# Patient Record
Sex: Male | Born: 1945 | Race: White | Hispanic: No | Marital: Married | Smoking: Former smoker
Health system: Southern US, Community
[De-identification: ages and names within clinical notes are randomized; demographics above are authoritative.]

## PROBLEM LIST (undated history)

## (undated) DIAGNOSIS — B029 Zoster without complications: Secondary | ICD-10-CM

## (undated) DIAGNOSIS — I2510 Atherosclerotic heart disease of native coronary artery without angina pectoris: Secondary | ICD-10-CM

## (undated) DIAGNOSIS — T7840XA Allergy, unspecified, initial encounter: Secondary | ICD-10-CM

## (undated) DIAGNOSIS — I472 Ventricular tachycardia, unspecified: Secondary | ICD-10-CM

## (undated) DIAGNOSIS — Z9889 Other specified postprocedural states: Secondary | ICD-10-CM

## (undated) DIAGNOSIS — D72819 Decreased white blood cell count, unspecified: Secondary | ICD-10-CM

## (undated) DIAGNOSIS — I10 Essential (primary) hypertension: Secondary | ICD-10-CM

## (undated) DIAGNOSIS — Z87442 Personal history of urinary calculi: Secondary | ICD-10-CM

## (undated) DIAGNOSIS — J45909 Unspecified asthma, uncomplicated: Secondary | ICD-10-CM

## (undated) DIAGNOSIS — K219 Gastro-esophageal reflux disease without esophagitis: Secondary | ICD-10-CM

## (undated) DIAGNOSIS — E785 Hyperlipidemia, unspecified: Secondary | ICD-10-CM

## (undated) DIAGNOSIS — C4491 Basal cell carcinoma of skin, unspecified: Secondary | ICD-10-CM

## (undated) DIAGNOSIS — M1990 Unspecified osteoarthritis, unspecified site: Secondary | ICD-10-CM

## (undated) DIAGNOSIS — H269 Unspecified cataract: Secondary | ICD-10-CM

## (undated) HISTORY — DX: Zoster without complications: B02.9

## (undated) HISTORY — DX: Allergy, unspecified, initial encounter: T78.40XA

## (undated) HISTORY — DX: Unspecified cataract: H26.9

## (undated) HISTORY — PX: CORONARY ANGIOPLASTY: SHX604

## (undated) HISTORY — DX: Essential (primary) hypertension: I10

## (undated) HISTORY — DX: Other specified postprocedural states: Z98.890

## (undated) HISTORY — DX: Basal cell carcinoma of skin, unspecified: C44.91

## (undated) HISTORY — DX: Unspecified asthma, uncomplicated: J45.909

## (undated) HISTORY — DX: Hyperlipidemia, unspecified: E78.5

## (undated) HISTORY — DX: Atherosclerotic heart disease of native coronary artery without angina pectoris: I25.10

---

## 1975-12-03 HISTORY — PX: PILONIDAL CYST EXCISION: SHX744

## 1977-12-02 HISTORY — PX: GANGLION CYST EXCISION: SHX1691

## 1997-12-02 DIAGNOSIS — C4491 Basal cell carcinoma of skin, unspecified: Secondary | ICD-10-CM

## 1997-12-02 HISTORY — PX: SKIN CANCER EXCISION: SHX779

## 1997-12-02 HISTORY — DX: Basal cell carcinoma of skin, unspecified: C44.91

## 1998-12-02 HISTORY — PX: VEIN SURGERY: SHX48

## 2005-10-17 ENCOUNTER — Ambulatory Visit: Payer: Self-pay | Admitting: Cardiovascular Disease

## 2006-07-10 ENCOUNTER — Emergency Department: Payer: Self-pay | Admitting: Emergency Medicine

## 2006-07-10 ENCOUNTER — Other Ambulatory Visit: Payer: Self-pay

## 2009-02-07 ENCOUNTER — Ambulatory Visit: Payer: Self-pay | Admitting: Gastroenterology

## 2009-04-25 ENCOUNTER — Ambulatory Visit: Payer: Self-pay | Admitting: Cardiovascular Disease

## 2011-12-03 DIAGNOSIS — Z87442 Personal history of urinary calculi: Secondary | ICD-10-CM

## 2011-12-03 HISTORY — DX: Personal history of urinary calculi: Z87.442

## 2012-04-13 ENCOUNTER — Ambulatory Visit: Payer: Self-pay | Admitting: Family Medicine

## 2013-04-16 ENCOUNTER — Ambulatory Visit: Payer: Self-pay | Admitting: Family Medicine

## 2015-06-01 ENCOUNTER — Other Ambulatory Visit: Payer: Self-pay | Admitting: Family Medicine

## 2015-06-13 ENCOUNTER — Other Ambulatory Visit: Payer: Self-pay | Admitting: Family Medicine

## 2015-06-20 DIAGNOSIS — I10 Essential (primary) hypertension: Secondary | ICD-10-CM | POA: Insufficient documentation

## 2015-06-20 DIAGNOSIS — B029 Zoster without complications: Secondary | ICD-10-CM | POA: Insufficient documentation

## 2015-06-20 DIAGNOSIS — C4491 Basal cell carcinoma of skin, unspecified: Secondary | ICD-10-CM | POA: Insufficient documentation

## 2015-06-20 DIAGNOSIS — E785 Hyperlipidemia, unspecified: Secondary | ICD-10-CM | POA: Insufficient documentation

## 2015-06-20 DIAGNOSIS — I2510 Atherosclerotic heart disease of native coronary artery without angina pectoris: Secondary | ICD-10-CM | POA: Insufficient documentation

## 2015-06-21 ENCOUNTER — Encounter: Payer: Self-pay | Admitting: Family Medicine

## 2015-06-21 ENCOUNTER — Ambulatory Visit (INDEPENDENT_AMBULATORY_CARE_PROVIDER_SITE_OTHER): Payer: 59 | Admitting: Family Medicine

## 2015-06-21 VITALS — BP 121/71 | HR 58 | Temp 97.6°F | Ht 72.0 in | Wt 211.0 lb

## 2015-06-21 DIAGNOSIS — I2510 Atherosclerotic heart disease of native coronary artery without angina pectoris: Secondary | ICD-10-CM | POA: Diagnosis not present

## 2015-06-21 DIAGNOSIS — I2583 Coronary atherosclerosis due to lipid rich plaque: Secondary | ICD-10-CM

## 2015-06-21 DIAGNOSIS — E785 Hyperlipidemia, unspecified: Secondary | ICD-10-CM | POA: Diagnosis not present

## 2015-06-21 DIAGNOSIS — I10 Essential (primary) hypertension: Secondary | ICD-10-CM | POA: Diagnosis not present

## 2015-06-21 DIAGNOSIS — Z0000 Encounter for general adult medical examination without abnormal findings: Secondary | ICD-10-CM | POA: Diagnosis not present

## 2015-06-21 LAB — URINALYSIS, ROUTINE W REFLEX MICROSCOPIC
Bilirubin, UA: NEGATIVE
Glucose, UA: NEGATIVE
KETONES UA: NEGATIVE
LEUKOCYTES UA: NEGATIVE
Nitrite, UA: NEGATIVE
PH UA: 6 (ref 5.0–7.5)
PROTEIN UA: NEGATIVE
RBC UA: NEGATIVE
SPEC GRAV UA: 1.02 (ref 1.005–1.030)
Urobilinogen, Ur: 1 mg/dL (ref 0.2–1.0)

## 2015-06-21 MED ORDER — METOPROLOL SUCCINATE ER 50 MG PO TB24: 50.0000 mg | ORAL_TABLET | Freq: Every day | ORAL | Status: DC

## 2015-06-21 MED ORDER — CLOPIDOGREL BISULFATE 75 MG PO TABS: 75.0000 mg | ORAL_TABLET | Freq: Every day | ORAL | Status: DC

## 2015-06-21 MED ORDER — NIACIN ER (ANTIHYPERLIPIDEMIC) 1000 MG PO TBCR: 1000.0000 mg | EXTENDED_RELEASE_TABLET | Freq: Every day | ORAL | Status: DC

## 2015-06-21 MED ORDER — ATORVASTATIN CALCIUM 80 MG PO TABS: 80.0000 mg | ORAL_TABLET | Freq: Every day | ORAL | Status: DC

## 2015-06-21 MED ORDER — RAMIPRIL 5 MG PO CAPS: 5.0000 mg | ORAL_CAPSULE | Freq: Every day | ORAL | Status: DC

## 2015-06-21 NOTE — Assessment & Plan Note (Signed)
The current medical regimen is effective;  continue present plan and medications.  

## 2015-06-21 NOTE — Progress Notes (Signed)
BP 121/71 mmHg  Pulse 58  Temp(Src) 97.6 F (36.4 C)  Ht 6' (1.829 m)  Wt 211 lb (95.709 kg)  BMI 28.61 kg/m2  SpO2 97%   Subjective:    Patient ID: Shawn Shepard, male    DOB: 12-01-1946, 69 y.o.   MRN: [id number]  HPI: Shawn Shepard is a 69 y.o. male  Chief Complaint  Patient presents with  . Annual Exam  AWV  Patient doing well with medications no issues concerns takes every day without problems or side effects Has very rare hot flashes from the niacin Does have easy bruising from Plavix varicose veins worse has apt at  [facility]  Has marked edema by end of day Edema goes away overnight with no PND orthopnea  Relevant past medical, surgical, family and social history reviewed and updated as indicated. Interim medical history since our last visit reviewed. Allergies and medications reviewed and updated.  Review of Systems  Constitutional: Negative.   HENT: Negative.   Eyes: Negative.   Respiratory: Negative.   Cardiovascular: Negative.   Endocrine: Negative.   Musculoskeletal: Negative.   Skin: Negative.   Allergic/Immunologic: Negative.   Neurological: Negative.   Hematological: Negative.   Psychiatric/Behavioral: Negative.     Per HPI unless specifically indicated above     Objective:    BP 121/71 mmHg  Pulse 58  Temp(Src) 97.6 F (36.4 C)  Ht 6' (1.829 m)  Wt 211 lb (95.709 kg)  BMI 28.61 kg/m2  SpO2 97%  Wt Readings from Last 3 Encounters:  06/21/15 211 lb (95.709 kg)  12/08/14 209 lb (94.802 kg)    Physical Exam  Constitutional: He is oriented to person, place, and time. He appears well-developed and well-nourished.  HENT:  Head: Normocephalic and atraumatic.  Right Ear: External ear normal.  Left Ear: External ear normal.  Eyes: Conjunctivae and EOM are normal. Pupils are equal, round, and reactive to light.  Neck: Normal range of motion. Neck supple.  Cardiovascular: Normal rate, regular rhythm, normal heart sounds and  intact distal pulses.   Pulmonary/Chest: Effort normal and breath sounds normal.  Abdominal: Soft. Bowel sounds are normal. There is no splenomegaly or hepatomegaly.  Genitourinary: Rectum normal, prostate normal and penis normal.  Musculoskeletal: Normal range of motion.  Neurological: He is alert and oriented to person, place, and time. He has normal reflexes.  Skin: No rash noted. No erythema.  Marked varicose veins both legs  Psychiatric: He has a normal mood and affect. His behavior is normal. Judgment and thought content normal.    No results found for this or any previous visit.    Assessment & Plan:   Problem List Items Addressed This Visit      Cardiovascular and Mediastinum   CAD (coronary artery disease)    Followed by cardiology      Relevant Medications   ramipril (ALTACE) 5 MG capsule   niacin (NIASPAN) 1000 MG CR tablet   metoprolol succinate (TOPROL-XL) 50 MG 24 hr tablet   clopidogrel (PLAVIX) 75 MG tablet   atorvastatin (LIPITOR) 80 MG tablet   Other Relevant Orders   LP+ALT+AST Piccolo, Waived   Hypertension - Primary    The current medical regimen is effective;  continue present plan and medications.       Relevant Medications   ramipril (ALTACE) 5 MG capsule   niacin (NIASPAN) 1000 MG CR tablet   metoprolol succinate (TOPROL-XL) 50 MG 24 hr tablet   atorvastatin (LIPITOR) 80 MG  tablet   Other Relevant Orders   Basic metabolic panel     Other   Hyperlipidemia    The current medical regimen is effective;  continue present plan and medications.       Relevant Medications   ramipril (ALTACE) 5 MG capsule   niacin (NIASPAN) 1000 MG CR tablet   metoprolol succinate (TOPROL-XL) 50 MG 24 hr tablet   atorvastatin (LIPITOR) 80 MG tablet   Other Relevant Orders   LP+ALT+AST Piccolo, Waived    Other Visit Diagnoses    PE (physical exam), annual        Relevant Orders    CBC with Differential/Platelet    Comprehensive metabolic panel    Lipid  panel    PSA    TSH    Urinalysis, Routine w reflex microscopic (not at [facility])        Follow up plan: Return in about 6 months (around 12/22/2015), or if symptoms worsen or fail to improve, for f/u medical.

## 2015-06-21 NOTE — Assessment & Plan Note (Signed)
Followed by cardiology 

## 2015-06-22 ENCOUNTER — Encounter: Payer: Self-pay | Admitting: Family Medicine

## 2015-06-22 LAB — CBC WITH DIFFERENTIAL/PLATELET
Basophils Absolute: 0 10*3/uL (ref 0.0–0.2)
Basos: 0 %
EOS (ABSOLUTE): 0.1 10*3/uL (ref 0.0–0.4)
Eos: 3 %
HEMATOCRIT: 41.6 % (ref 37.5–51.0)
Hemoglobin: 14 g/dL (ref 12.6–17.7)
IMMATURE GRANULOCYTES: 0 %
Immature Grans (Abs): 0 10*3/uL (ref 0.0–0.1)
Lymphocytes Absolute: 0.7 10*3/uL (ref 0.7–3.1)
Lymphs: 25 %
MCH: 30.9 pg (ref 26.6–33.0)
MCHC: 33.7 g/dL (ref 31.5–35.7)
MCV: 92 fL (ref 79–97)
MONOCYTES: 16 %
Monocytes Absolute: 0.5 10*3/uL (ref 0.1–0.9)
Neutrophils Absolute: 1.6 10*3/uL (ref 1.4–7.0)
Neutrophils: 56 %
Platelets: 150 10*3/uL (ref 150–379)
RBC: 4.53 x10E6/uL (ref 4.14–5.80)
RDW: 14.4 % (ref 12.3–15.4)
WBC: 2.9 10*3/uL — AB (ref 3.4–10.8)

## 2015-06-22 LAB — COMPREHENSIVE METABOLIC PANEL
ALT: 23 IU/L (ref 0–44)
AST: 26 IU/L (ref 0–40)
Albumin/Globulin Ratio: 1.6 (ref 1.1–2.5)
Albumin: 3.8 g/dL (ref 3.6–4.8)
Alkaline Phosphatase: 73 IU/L (ref 39–117)
BUN/Creatinine Ratio: 14 (ref 10–22)
BUN: 13 mg/dL (ref 8–27)
Bilirubin Total: 0.8 mg/dL (ref 0.0–1.2)
CALCIUM: 9.7 mg/dL (ref 8.6–10.2)
CO2: 23 mmol/L (ref 18–29)
CREATININE: 0.91 mg/dL (ref 0.76–1.27)
Chloride: 104 mmol/L (ref 97–108)
GFR calc Af Amer: 100 mL/min/{1.73_m2} (ref >59)
GFR, EST NON AFRICAN AMERICAN: 87 mL/min/{1.73_m2} (ref >59)
GLOBULIN, TOTAL: 2.4 g/dL (ref 1.5–4.5)
GLUCOSE: 102 mg/dL — AB (ref 65–99)
Potassium: 4.8 mmol/L (ref 3.5–5.2)
SODIUM: 142 mmol/L (ref 134–144)
TOTAL PROTEIN: 6.2 g/dL (ref 6.0–8.5)

## 2015-06-22 LAB — LIPID PANEL
CHOL/HDL RATIO: 2.3 ratio (ref 0.0–5.0)
CHOLESTEROL TOTAL: 127 mg/dL (ref 100–199)
HDL: 55 mg/dL (ref >39)
LDL Calculated: 56 mg/dL (ref 0–99)
Triglycerides: 80 mg/dL (ref 0–149)
VLDL CHOLESTEROL CAL: 16 mg/dL (ref 5–40)

## 2015-06-22 LAB — TSH: TSH: 1.23 u[IU]/mL (ref 0.450–4.500)

## 2015-06-22 LAB — PSA: PROSTATE SPECIFIC AG, SERUM: 0.5 ng/mL (ref 0.0–4.0)

## 2015-12-03 HISTORY — PX: CATARACT EXTRACTION: SUR2

## 2015-12-03 HISTORY — PX: EYE SURGERY: SHX253

## 2015-12-28 ENCOUNTER — Ambulatory Visit (INDEPENDENT_AMBULATORY_CARE_PROVIDER_SITE_OTHER): Payer: 59 | Admitting: Family Medicine

## 2015-12-28 ENCOUNTER — Encounter: Payer: Self-pay | Admitting: Family Medicine

## 2015-12-28 VITALS — BP 121/77 | HR 67 | Temp 97.9°F | Ht 72.3 in | Wt 207.0 lb

## 2015-12-28 DIAGNOSIS — I2583 Coronary atherosclerosis due to lipid rich plaque: Secondary | ICD-10-CM

## 2015-12-28 DIAGNOSIS — E785 Hyperlipidemia, unspecified: Secondary | ICD-10-CM

## 2015-12-28 DIAGNOSIS — Z113 Encounter for screening for infections with a predominantly sexual mode of transmission: Secondary | ICD-10-CM | POA: Diagnosis not present

## 2015-12-28 DIAGNOSIS — I10 Essential (primary) hypertension: Secondary | ICD-10-CM

## 2015-12-28 DIAGNOSIS — I2510 Atherosclerotic heart disease of native coronary artery without angina pectoris: Secondary | ICD-10-CM

## 2015-12-28 LAB — LP+ALT+AST PICCOLO, WAIVED
ALT (SGPT) Piccolo, Waived: 29 U/L (ref 10–47)
AST (SGOT) Piccolo, Waived: 48 U/L — ABNORMAL HIGH (ref 11–38)
Chol/HDL Ratio Piccolo,Waive: 2.3 mg/dL
Cholesterol Piccolo, Waived: 114 mg/dL (ref <200)
HDL Chol Piccolo, Waived: 50 mg/dL — ABNORMAL LOW (ref >59)
LDL Chol Calc Piccolo Waived: 50 mg/dL (ref <100)
Triglycerides Piccolo,Waived: 65 mg/dL (ref <150)
VLDL CHOL CALC PICCOLO,WAIVE: 13 mg/dL (ref <30)

## 2015-12-28 NOTE — Assessment & Plan Note (Signed)
Reviewed EKG with no acute changes Discussed cardiology referral patient will go back to his regular cardiologist Dr. Chancy Milroy He will call for an appointment  because of high risk Discuss calling 911 aspirin We'll get labs including CBC Discuss continuing current medications

## 2015-12-28 NOTE — Assessment & Plan Note (Signed)
The current medical regimen is effective;  continue present plan and medications.  

## 2015-12-28 NOTE — Progress Notes (Signed)
BP 121/77 mmHg  Pulse 67  Temp(Src) 97.9 F (36.6 C)  Ht 6' 0.3" (1.836 m)  Wt 207 lb (93.895 kg)  BMI 27.85 kg/m2  SpO2 96%   Subjective:    Patient ID: Shawn Shepard, male    DOB: 30-Dec-1945, 70 y.o.   MRN: [id number]  HPI: Shawn Shepard is a 70 y.o. male  Chief Complaint  Patient presents with  . Hyperlipidemia  . Hypertension  . Coronary Artery Disease   Patient follow-up cholesterol blood pressure doing well with no complaints from medications Coronary artery disease patient's developed some slight chest pain not necessarily with exertion last for a few seconds. No radiation no nausea vomiting diaphoresis but concerned because of personal history of coronary artery disease This problem has been going on for 2 months or so intermittently Reviewed previous cardiology notes Patient with 40-50% cardiac coronary artery lesions  Relevant past medical, surgical, family and social history reviewed and updated as indicated. Interim medical history since our last visit reviewed. Allergies and medications reviewed and updated.  Other than noted above Review of Systems  Constitutional: Negative.   HENT: Negative.   Eyes: Negative.   Respiratory: Negative.   Cardiovascular: Negative.   Gastrointestinal: Negative.   Endocrine: Negative.   Genitourinary: Negative.   Musculoskeletal: Negative.   Skin: Negative.   Allergic/Immunologic: Negative.   Neurological: Negative.   Hematological: Negative.   Psychiatric/Behavioral: Negative.     Per HPI unless specifically indicated above     Objective:    BP 121/77 mmHg  Pulse 67  Temp(Src) 97.9 F (36.6 C)  Ht 6' 0.3" (1.836 m)  Wt 207 lb (93.895 kg)  BMI 27.85 kg/m2  SpO2 96%  Wt Readings from Last 3 Encounters:  12/28/15 207 lb (93.895 kg)  06/21/15 211 lb (95.709 kg)  12/08/14 209 lb (94.802 kg)    Physical Exam  Constitutional: He is oriented to person, place, and time. He appears well-developed and  well-nourished. No distress.  HENT:  Head: Normocephalic and atraumatic.  Right Ear: Hearing normal.  Left Ear: Hearing normal.  Nose: Nose normal.  Mouth/Throat: Oropharynx is clear and moist.  Eyes: Conjunctivae and lids are normal. Pupils are equal, round, and reactive to light. Right eye exhibits no discharge. Left eye exhibits no discharge. No scleral icterus.  Neck: Normal range of motion. No JVD present. No tracheal deviation present.  Cardiovascular: Normal rate, regular rhythm and normal heart sounds.   No murmur heard. Pulmonary/Chest: Effort normal and breath sounds normal. No stridor. No respiratory distress. He has no wheezes. He has no rales. He exhibits no tenderness.  Abdominal: He exhibits no distension and no mass. There is no tenderness.  Musculoskeletal: Normal range of motion. He exhibits no edema or tenderness.  Lymphadenopathy:    He has no cervical adenopathy.  Neurological: He is alert and oriented to person, place, and time.  Skin: Skin is intact. No rash noted. He is not diaphoretic.  Psychiatric: He has a normal mood and affect. His speech is normal and behavior is normal. Judgment and thought content normal. Cognition and memory are normal.    Results for orders placed or performed in visit on 12/28/15  LP+ALT+AST Piccolo, [employer]  Result Value Ref Range   ALT (SGPT) Piccolo, Waived 29 10 - 47 U/L   AST (SGOT) Piccolo, Waived 48 (H) 11 - 38 U/L   Cholesterol Piccolo, Waived 114 <200 mg/dL   HDL Chol Piccolo, Waived 50 (L) >59 mg/dL  Triglycerides Piccolo,Waived 65 <150 mg/dL   Chol/HDL Ratio Piccolo,Waive 2.3 mg/dL   LDL Chol Calc Piccolo Waived 50 <100 mg/dL   VLDL Chol Calc Piccolo,Waive 13 <30 mg/dL      Assessment & Plan:   Problem List Items Addressed This Visit      Cardiovascular and Mediastinum   Hypertension    The current medical regimen is effective;  continue present plan and medications.       CAD (coronary artery disease)     Reviewed EKG with no acute changes Discussed cardiology referral patient will go back to his regular cardiologist Dr. Chancy Milroy He will call for an appointment  because of high risk Discuss calling 911 aspirin We'll get labs including CBC Discuss continuing current medications       Relevant Orders   EKG 12-Lead (Completed)   CBC with Differential/Platelet     Other   Hyperlipidemia    The current medical regimen is effective;  continue present plan and medications.        Other Visit Diagnoses    Routine screening for STI (sexually transmitted infection)    -  Primary    Relevant Orders    Hepatitis C Antibody        Follow up plan: Return in about 6 months (around 06/26/2016), or if symptoms worsen or fail to improve, for Physical Exam.

## 2015-12-29 LAB — BASIC METABOLIC PANEL
BUN/Creatinine Ratio: 11 (ref 10–22)
BUN: 11 mg/dL (ref 8–27)
CALCIUM: 10.1 mg/dL (ref 8.6–10.2)
CO2: 23 mmol/L (ref 18–29)
Chloride: 105 mmol/L (ref 96–106)
Creatinine, Ser: 0.96 mg/dL (ref 0.76–1.27)
GFR calc non Af Amer: 81 mL/min/{1.73_m2} (ref >59)
GFR, EST AFRICAN AMERICAN: 94 mL/min/{1.73_m2} (ref >59)
Glucose: 106 mg/dL — ABNORMAL HIGH (ref 65–99)
Potassium: 4.4 mmol/L (ref 3.5–5.2)
Sodium: 141 mmol/L (ref 134–144)

## 2015-12-29 LAB — CBC WITH DIFFERENTIAL/PLATELET
BASOS ABS: 0 10*3/uL (ref 0.0–0.2)
Basos: 1 %
EOS (ABSOLUTE): 0.1 10*3/uL (ref 0.0–0.4)
Eos: 5 %
Hematocrit: 41.3 % (ref 37.5–51.0)
Hemoglobin: 13.8 g/dL (ref 12.6–17.7)
IMMATURE GRANS (ABS): 0 10*3/uL (ref 0.0–0.1)
Immature Granulocytes: 0 %
LYMPHS: 25 %
Lymphocytes Absolute: 0.8 10*3/uL (ref 0.7–3.1)
MCH: 30.6 pg (ref 26.6–33.0)
MCHC: 33.4 g/dL (ref 31.5–35.7)
MCV: 92 fL (ref 79–97)
Monocytes Absolute: 0.5 10*3/uL (ref 0.1–0.9)
Monocytes: 17 %
NEUTROS ABS: 1.6 10*3/uL (ref 1.4–7.0)
Neutrophils: 52 %
PLATELETS: 175 10*3/uL (ref 150–379)
RBC: 4.51 x10E6/uL (ref 4.14–5.80)
RDW: 14.2 % (ref 12.3–15.4)
WBC: 3.1 10*3/uL — ABNORMAL LOW (ref 3.4–10.8)

## 2015-12-29 LAB — HEPATITIS C ANTIBODY: HEP C VIRUS AB: 0.1 {s_co_ratio} (ref 0.0–0.9)

## 2016-01-01 ENCOUNTER — Encounter: Payer: Self-pay | Admitting: Family Medicine

## 2016-01-16 ENCOUNTER — Ambulatory Visit
Admission: RE | Admit: 2016-01-16 | Discharge: 2016-01-16 | Disposition: A | Discharge status: Home / Self Care | Payer: Medicare Other | Source: Ambulatory Visit | Attending: Cardiovascular Disease | Admitting: Cardiovascular Disease

## 2016-01-16 ENCOUNTER — Encounter
Admission: RE | Disposition: A | Discharge status: Home / Self Care | Payer: Self-pay | Source: Ambulatory Visit | Attending: Cardiovascular Disease

## 2016-01-16 DIAGNOSIS — I2510 Atherosclerotic heart disease of native coronary artery without angina pectoris: Secondary | ICD-10-CM | POA: Insufficient documentation

## 2016-01-16 DIAGNOSIS — Z79899 Other long term (current) drug therapy: Secondary | ICD-10-CM | POA: Insufficient documentation

## 2016-01-16 DIAGNOSIS — Z7982 Long term (current) use of aspirin: Secondary | ICD-10-CM | POA: Insufficient documentation

## 2016-01-16 DIAGNOSIS — I10 Essential (primary) hypertension: Secondary | ICD-10-CM | POA: Diagnosis not present

## 2016-01-16 DIAGNOSIS — Z87891 Personal history of nicotine dependence: Secondary | ICD-10-CM | POA: Diagnosis not present

## 2016-01-16 DIAGNOSIS — R079 Chest pain, unspecified: Secondary | ICD-10-CM | POA: Diagnosis present

## 2016-01-16 DIAGNOSIS — I259 Chronic ischemic heart disease, unspecified: Secondary | ICD-10-CM | POA: Insufficient documentation

## 2016-01-16 DIAGNOSIS — E785 Hyperlipidemia, unspecified: Secondary | ICD-10-CM | POA: Diagnosis not present

## 2016-01-16 DIAGNOSIS — Z8249 Family history of ischemic heart disease and other diseases of the circulatory system: Secondary | ICD-10-CM | POA: Insufficient documentation

## 2016-01-16 DIAGNOSIS — Z888 Allergy status to other drugs, medicaments and biological substances status: Secondary | ICD-10-CM | POA: Diagnosis not present

## 2016-01-16 DIAGNOSIS — Z7902 Long term (current) use of antithrombotics/antiplatelets: Secondary | ICD-10-CM | POA: Insufficient documentation

## 2016-01-16 DIAGNOSIS — Z9889 Other specified postprocedural states: Secondary | ICD-10-CM | POA: Insufficient documentation

## 2016-01-16 HISTORY — PX: CARDIAC CATHETERIZATION: SHX172

## 2016-01-16 SURGERY — LEFT HEART CATH AND CORONARY ANGIOGRAPHY
Anesthesia: Moderate Sedation

## 2016-01-16 MED ORDER — SODIUM CHLORIDE 0.9 % WEIGHT BASED INFUSION: 1.0000 mL/kg/h | INTRAVENOUS | Status: DC

## 2016-01-16 MED ORDER — ACETAMINOPHEN 325 MG PO TABS: 650.0000 mg | ORAL_TABLET | ORAL | Status: DC | PRN

## 2016-01-16 MED ORDER — HEPARIN (PORCINE) IN NACL 2-0.9 UNIT/ML-% IJ SOLN
INTRAMUSCULAR | Status: AC
  Filled 2016-01-16: qty 1000

## 2016-01-16 MED ORDER — SODIUM CHLORIDE 0.9 % WEIGHT BASED INFUSION
3.0000 mL/kg/h | INTRAVENOUS | Status: DC
  Administered 2016-01-16: 3 mL/kg/h via INTRAVENOUS

## 2016-01-16 MED ORDER — MIDAZOLAM HCL 2 MG/2ML IJ SOLN
INTRAMUSCULAR | Status: DC | PRN
  Administered 2016-01-16: 1 mg via INTRAVENOUS

## 2016-01-16 MED ORDER — SODIUM CHLORIDE 0.9% FLUSH: 3.0000 mL | Freq: Two times a day (BID) | INTRAVENOUS | Status: DC

## 2016-01-16 MED ORDER — FENTANYL CITRATE (PF) 100 MCG/2ML IJ SOLN
INTRAMUSCULAR | Status: DC | PRN
  Administered 2016-01-16: 50 ug via INTRAVENOUS

## 2016-01-16 MED ORDER — SODIUM CHLORIDE 0.9 % IV SOLN: 250.0000 mL | INTRAVENOUS | Status: DC | PRN

## 2016-01-16 MED ORDER — MIDAZOLAM HCL 2 MG/2ML IJ SOLN
INTRAMUSCULAR | Status: AC
  Filled 2016-01-16: qty 2

## 2016-01-16 MED ORDER — FENTANYL CITRATE (PF) 100 MCG/2ML IJ SOLN
INTRAMUSCULAR | Status: AC
  Filled 2016-01-16: qty 2

## 2016-01-16 MED ORDER — SODIUM CHLORIDE 0.9% FLUSH: 3.0000 mL | INTRAVENOUS | Status: DC | PRN

## 2016-01-16 MED ORDER — ONDANSETRON HCL 4 MG/2ML IJ SOLN: 4.0000 mg | Freq: Four times a day (QID) | INTRAMUSCULAR | Status: DC | PRN

## 2016-01-16 MED ORDER — IOHEXOL 300 MG/ML  SOLN
INTRAMUSCULAR | Status: DC | PRN
  Administered 2016-01-16: 110 mL via INTRA_ARTICULAR

## 2016-01-16 SURGICAL SUPPLY — 9 items
CATH INFINITI 5FR ANG PIGTAIL (CATHETERS) ×3 IMPLANT
CATH INFINITI 5FR JL4 (CATHETERS) ×3 IMPLANT
CATH INFINITI JR4 5F (CATHETERS) ×3 IMPLANT
DEVICE CLOSURE MYNXGRIP 5F (Vascular Products) ×3 IMPLANT
KIT MANI 3VAL PERCEP (MISCELLANEOUS) ×3 IMPLANT
NEEDLE PERC 18GX7CM (NEEDLE) ×3 IMPLANT
PACK CARDIAC CATH (CUSTOM PROCEDURE TRAY) ×3 IMPLANT
SHEATH PINNACLE 5F 10CM (SHEATH) ×3 IMPLANT
WIRE EMERALD 3MM-J .035X150CM (WIRE) ×3 IMPLANT

## 2016-01-16 NOTE — Discharge Instructions (Signed)

## 2016-08-18 ENCOUNTER — Other Ambulatory Visit: Payer: Self-pay | Admitting: Family Medicine

## 2016-08-18 DIAGNOSIS — I10 Essential (primary) hypertension: Secondary | ICD-10-CM

## 2016-08-20 ENCOUNTER — Encounter (INDEPENDENT_AMBULATORY_CARE_PROVIDER_SITE_OTHER): Payer: Self-pay

## 2016-09-10 ENCOUNTER — Ambulatory Visit (INDEPENDENT_AMBULATORY_CARE_PROVIDER_SITE_OTHER): Payer: Medicare Other | Admitting: Family Medicine

## 2016-09-10 ENCOUNTER — Encounter: Payer: Self-pay | Admitting: Family Medicine

## 2016-09-10 VITALS — BP 121/74 | HR 58 | Temp 97.5°F | Ht 72.0 in | Wt 208.2 lb

## 2016-09-10 DIAGNOSIS — I2510 Atherosclerotic heart disease of native coronary artery without angina pectoris: Secondary | ICD-10-CM

## 2016-09-10 DIAGNOSIS — Z23 Encounter for immunization: Secondary | ICD-10-CM | POA: Diagnosis not present

## 2016-09-10 DIAGNOSIS — I10 Essential (primary) hypertension: Secondary | ICD-10-CM | POA: Diagnosis not present

## 2016-09-10 DIAGNOSIS — Z0000 Encounter for general adult medical examination without abnormal findings: Secondary | ICD-10-CM | POA: Diagnosis not present

## 2016-09-10 DIAGNOSIS — I2583 Coronary atherosclerosis due to lipid rich plaque: Secondary | ICD-10-CM

## 2016-09-10 DIAGNOSIS — E7800 Pure hypercholesterolemia, unspecified: Secondary | ICD-10-CM

## 2016-09-10 LAB — URINALYSIS, ROUTINE W REFLEX MICROSCOPIC
Bilirubin, UA: NEGATIVE
Glucose, UA: NEGATIVE
KETONES UA: NEGATIVE
LEUKOCYTES UA: NEGATIVE
Nitrite, UA: NEGATIVE
PROTEIN UA: NEGATIVE
RBC UA: NEGATIVE
SPEC GRAV UA: 1.025 (ref 1.005–1.030)
Urobilinogen, Ur: 1 mg/dL (ref 0.2–1.0)
pH, UA: 5.5 (ref 5.0–7.5)

## 2016-09-10 MED ORDER — CLOPIDOGREL BISULFATE 75 MG PO TABS: 75.0000 mg | ORAL_TABLET | Freq: Every day | ORAL | 4 refills | Status: DC

## 2016-09-10 MED ORDER — ATORVASTATIN CALCIUM 80 MG PO TABS: 80.0000 mg | ORAL_TABLET | Freq: Every day | ORAL | 4 refills | Status: DC

## 2016-09-10 MED ORDER — METOPROLOL SUCCINATE ER 50 MG PO TB24: 50.0000 mg | ORAL_TABLET | Freq: Every day | ORAL | 4 refills | Status: DC

## 2016-09-10 MED ORDER — NIACIN ER (ANTIHYPERLIPIDEMIC) 1000 MG PO TBCR: 1000.0000 mg | EXTENDED_RELEASE_TABLET | Freq: Every day | ORAL | 4 refills | Status: DC

## 2016-09-10 NOTE — Assessment & Plan Note (Signed)
The current medical regimen is effective;  continue present plan and medications.  

## 2016-09-10 NOTE — Progress Notes (Signed)
BP 121/74 (BP Location: Left Arm, Patient Position: Sitting, Cuff Size: Normal)   Pulse (!) 58   Temp 97.5 F (36.4 C)   Ht 6' (1.829 m)   Wt 208 lb 3.2 oz (94.4 kg)   SpO2 99%   BMI 28.24 kg/m    Subjective:    Patient ID: Shawn Shepard, male    DOB: 1946-02-18, 70 y.o.   MRN: [id number]  HPI: Shawn Shepard is a 70 y.o. male  Chief Complaint  Patient presents with  . Annual Exam   AWV metrics met Patient concerned about possibility of osteoporosis has developed some mid upper back pain discomfort a little bit of posterior neck area home area and maybe some concern about height loss. On review patient's 6 feet today but on previous height records patient was 6 feet 1-1/2 inches. And patient remembers in high school was 6 feet 3 inches.  Taking other medicines for blood pressure cholesterol doing well with no issues no side effects  Relevant past medical, surgical, family and social history reviewed and updated as indicated. Interim medical history since our last visit reviewed. Allergies and medications reviewed and updated.  Review of Systems  Constitutional: Negative.   HENT: Negative.   Eyes: Negative.   Respiratory: Negative.   Cardiovascular: Negative.   Gastrointestinal: Negative.   Endocrine: Negative.   Genitourinary: Negative.   Musculoskeletal: Negative.   Skin: Negative.   Allergic/Immunologic: Negative.   Neurological: Negative.   Hematological: Negative.   Psychiatric/Behavioral: Negative.     Per HPI unless specifically indicated above     Objective:    BP 121/74 (BP Location: Left Arm, Patient Position: Sitting, Cuff Size: Normal)   Pulse (!) 58   Temp 97.5 F (36.4 C)   Ht 6' (1.829 m)   Wt 208 lb 3.2 oz (94.4 kg)   SpO2 99%   BMI 28.24 kg/m   Wt Readings from Last 3 Encounters:  09/10/16 208 lb 3.2 oz (94.4 kg)  01/16/16 202 lb (91.6 kg)  12/28/15 207 lb (93.9 kg)    Physical Exam  Constitutional: He is oriented to person,  place, and time. He appears well-developed and well-nourished.  HENT:  Head: Normocephalic and atraumatic.  Right Ear: External ear normal.  Left Ear: External ear normal.  Eyes: Conjunctivae and EOM are normal. Pupils are equal, round, and reactive to light.  Neck: Normal range of motion. Neck supple.  Cardiovascular: Normal rate, regular rhythm, normal heart sounds and intact distal pulses.   Pulmonary/Chest: Effort normal and breath sounds normal.  Abdominal: Soft. Bowel sounds are normal. There is no splenomegaly or hepatomegaly.  Genitourinary: Rectum normal, prostate normal and penis normal.  Musculoskeletal: Normal range of motion.  Neurological: He is alert and oriented to person, place, and time. He has normal reflexes.  Skin: No rash noted. No erythema.  Psychiatric: He has a normal mood and affect. His behavior is normal. Judgment and thought content normal.    Results for orders placed or performed in visit on 57/50/51  Basic metabolic panel  Result Value Ref Range   Glucose 106 (H) 65 - 99 mg/dL   BUN 11 8 - 27 mg/dL   Creatinine, Ser 0.96 0.76 - 1.27 mg/dL   GFR calc non Af Amer 81 >59 mL/min/1.73   GFR calc Af Amer 94 >59 mL/min/1.73   BUN/Creatinine Ratio 11 10 - 22   Sodium 141 134 - 144 mmol/L   Potassium 4.4 3.5 - 5.2 mmol/L  Chloride 105 96 - 106 mmol/L   CO2 23 18 - 29 mmol/L   Calcium 10.1 8.6 - 10.2 mg/dL  LP+ALT+AST Piccolo, Waived  Result Value Ref Range   ALT (SGPT) Piccolo, Waived 29 10 - 47 U/L   AST (SGOT) Piccolo, Waived 48 (H) 11 - 38 U/L   Cholesterol Piccolo, Waived 114 <200 mg/dL   HDL Chol Piccolo, Waived 50 (L) >59 mg/dL   Triglycerides Piccolo,Waived 65 <150 mg/dL   Chol/HDL Ratio Piccolo,Waive 2.3 mg/dL   LDL Chol Calc Piccolo Waived 50 <100 mg/dL   VLDL Chol Calc Piccolo,Waive 13 <30 mg/dL  Hepatitis C Antibody  Result Value Ref Range   Hep C Virus Ab 0.1 0.0 - 0.9 s/co ratio  CBC with Differential/Platelet  Result Value Ref Range     WBC 3.1 (L) 3.4 - 10.8 x10E3/uL   RBC 4.51 4.14 - 5.80 x10E6/uL   Hemoglobin 13.8 12.6 - 17.7 g/dL   Hematocrit 41.3 37.5 - 51.0 %   MCV 92 79 - 97 fL   MCH 30.6 26.6 - 33.0 pg   MCHC 33.4 31.5 - 35.7 g/dL   RDW 14.2 12.3 - 15.4 %   Platelets 175 150 - 379 x10E3/uL   Neutrophils 52 %   Lymphs 25 %   Monocytes 17 %   Eos 5 %   Basos 1 %   Neutrophils Absolute 1.6 1.4 - 7.0 x10E3/uL   Lymphocytes Absolute 0.8 0.7 - 3.1 x10E3/uL   Monocytes Absolute 0.5 0.1 - 0.9 x10E3/uL   EOS (ABSOLUTE) 0.1 0.0 - 0.4 x10E3/uL   Basophils Absolute 0.0 0.0 - 0.2 x10E3/uL   Immature Granulocytes 0 %   Immature Grans (Abs) 0.0 0.0 - 0.1 x10E3/uL      Assessment & Plan:   Problem List Items Addressed This Visit      Cardiovascular and Mediastinum   CAD (coronary artery disease)    The current medical regimen is effective;  continue present plan and medications.       Relevant Medications   niacin (NIASPAN) 1000 MG CR tablet   metoprolol succinate (TOPROL-XL) 50 MG 24 hr tablet   clopidogrel (PLAVIX) 75 MG tablet   atorvastatin (LIPITOR) 80 MG tablet   Hypertension    The current medical regimen is effective;  continue present plan and medications.       Relevant Medications   niacin (NIASPAN) 1000 MG CR tablet   metoprolol succinate (TOPROL-XL) 50 MG 24 hr tablet   atorvastatin (LIPITOR) 80 MG tablet     Other   Hyperlipidemia    The current medical regimen is effective;  continue present plan and medications.       Relevant Medications   niacin (NIASPAN) 1000 MG CR tablet   metoprolol succinate (TOPROL-XL) 50 MG 24 hr tablet   atorvastatin (LIPITOR) 80 MG tablet    Other Visit Diagnoses    Need for pneumococcal vaccination    -  Primary   Relevant Orders   Pneumococcal polysaccharide vaccine 23-valent greater than or equal to 2yo subcutaneous/IM (Completed)   Annual physical exam       Relevant Orders   CBC with Differential/Platelet   Comprehensive metabolic panel    Lipid panel   PSA   TSH   Urinalysis, Routine w reflex microscopic (not at [facility])       Follow up plan: Return in about 6 months (around 03/11/2017) for BMP,  Lipids, ALT, AST.

## 2016-09-11 ENCOUNTER — Encounter: Payer: Self-pay | Admitting: Family Medicine

## 2016-09-11 LAB — COMPREHENSIVE METABOLIC PANEL
ALT: 22 IU/L (ref 0–44)
AST: 25 IU/L (ref 0–40)
Albumin/Globulin Ratio: 2 (ref 1.2–2.2)
Albumin: 4.1 g/dL (ref 3.6–4.8)
Alkaline Phosphatase: 74 IU/L (ref 39–117)
BUN/Creatinine Ratio: 13 (ref 10–24)
BUN: 13 mg/dL (ref 8–27)
Bilirubin Total: 0.5 mg/dL (ref 0.0–1.2)
CALCIUM: 10 mg/dL (ref 8.6–10.2)
CO2: 23 mmol/L (ref 18–29)
CREATININE: 1 mg/dL (ref 0.76–1.27)
Chloride: 102 mmol/L (ref 96–106)
GFR calc Af Amer: 89 mL/min/{1.73_m2} (ref >59)
GFR, EST NON AFRICAN AMERICAN: 77 mL/min/{1.73_m2} (ref >59)
Globulin, Total: 2.1 g/dL (ref 1.5–4.5)
Glucose: 95 mg/dL (ref 65–99)
Potassium: 4.2 mmol/L (ref 3.5–5.2)
Sodium: 141 mmol/L (ref 134–144)
Total Protein: 6.2 g/dL (ref 6.0–8.5)

## 2016-09-11 LAB — CBC WITH DIFFERENTIAL/PLATELET
Basophils Absolute: 0 10*3/uL (ref 0.0–0.2)
Basos: 1 %
EOS (ABSOLUTE): 0.3 10*3/uL (ref 0.0–0.4)
EOS: 7 %
HEMATOCRIT: 39.9 % (ref 37.5–51.0)
Hemoglobin: 13.3 g/dL (ref 12.6–17.7)
IMMATURE GRANULOCYTES: 0 %
Immature Grans (Abs): 0 10*3/uL (ref 0.0–0.1)
Lymphocytes Absolute: 1.1 10*3/uL (ref 0.7–3.1)
Lymphs: 31 %
MCH: 30.4 pg (ref 26.6–33.0)
MCHC: 33.3 g/dL (ref 31.5–35.7)
MCV: 91 fL (ref 79–97)
MONOS ABS: 0.6 10*3/uL (ref 0.1–0.9)
Monocytes: 17 %
NEUTROS PCT: 44 %
Neutrophils Absolute: 1.6 10*3/uL (ref 1.4–7.0)
PLATELETS: 159 10*3/uL (ref 150–379)
RBC: 4.37 x10E6/uL (ref 4.14–5.80)
RDW: 14.6 % (ref 12.3–15.4)
WBC: 3.6 10*3/uL (ref 3.4–10.8)

## 2016-09-11 LAB — PSA: Prostate Specific Ag, Serum: 0.7 ng/mL (ref 0.0–4.0)

## 2016-09-11 LAB — LIPID PANEL
CHOL/HDL RATIO: 2.8 ratio (ref 0.0–5.0)
Cholesterol, Total: 130 mg/dL (ref 100–199)
HDL: 47 mg/dL (ref >39)
LDL CALC: 62 mg/dL (ref 0–99)
TRIGLYCERIDES: 103 mg/dL (ref 0–149)
VLDL Cholesterol Cal: 21 mg/dL (ref 5–40)

## 2016-09-11 LAB — TSH: TSH: 1.14 u[IU]/mL (ref 0.450–4.500)

## 2016-09-16 ENCOUNTER — Telehealth: Payer: Self-pay | Admitting: Family Medicine

## 2016-09-16 DIAGNOSIS — Z9189 Other specified personal risk factors, not elsewhere classified: Secondary | ICD-10-CM

## 2016-09-16 NOTE — Telephone Encounter (Signed)
Dr. Jeananne Rama, I put an order in for this patient just in case.  If you feel he doesn't need to have it I can cancel it.

## 2016-09-16 NOTE — Telephone Encounter (Signed)
Pt called has questions about scheduling a bone density scan. Please call pt as soon as possible. Thanks.

## 2016-09-17 NOTE — Telephone Encounter (Signed)
Left message to call.

## 2016-09-17 NOTE — Telephone Encounter (Signed)
Patient called back, he won't be available until this afternoon.

## 2016-09-17 NOTE — Telephone Encounter (Signed)
Call pt 

## 2016-09-18 NOTE — Telephone Encounter (Signed)
Please double check the patient's bone density has been scheduled.

## 2016-09-18 NOTE — Telephone Encounter (Signed)
Phone call  Reminder about bone density for patient patient experienced height loss. Has noticed increased dowager's hump like in his upper back No real back pain symptoms no known trauma Will schedule bone density.

## 2016-09-18 NOTE — Telephone Encounter (Signed)
Left message to call.

## 2016-09-19 NOTE — Telephone Encounter (Signed)
Bone density has been ordered, they will call patient to schedule appointment.

## 2017-03-04 ENCOUNTER — Ambulatory Visit (INDEPENDENT_AMBULATORY_CARE_PROVIDER_SITE_OTHER): Payer: Medicare Other | Admitting: Family Medicine

## 2017-03-04 ENCOUNTER — Encounter: Payer: Self-pay | Admitting: Family Medicine

## 2017-03-04 DIAGNOSIS — E7800 Pure hypercholesterolemia, unspecified: Secondary | ICD-10-CM

## 2017-03-04 DIAGNOSIS — K625 Hemorrhage of anus and rectum: Secondary | ICD-10-CM

## 2017-03-04 DIAGNOSIS — I10 Essential (primary) hypertension: Secondary | ICD-10-CM | POA: Diagnosis not present

## 2017-03-04 DIAGNOSIS — I2583 Coronary atherosclerosis due to lipid rich plaque: Secondary | ICD-10-CM

## 2017-03-04 DIAGNOSIS — I2510 Atherosclerotic heart disease of native coronary artery without angina pectoris: Secondary | ICD-10-CM

## 2017-03-04 DIAGNOSIS — K2100 Gastro-esophageal reflux disease with esophagitis, without bleeding: Secondary | ICD-10-CM | POA: Insufficient documentation

## 2017-03-04 DIAGNOSIS — K200 Eosinophilic esophagitis: Secondary | ICD-10-CM | POA: Insufficient documentation

## 2017-03-04 MED ORDER — PANTOPRAZOLE SODIUM 40 MG PO TBEC: 40.0000 mg | DELAYED_RELEASE_TABLET | Freq: Every day | ORAL | 3 refills | Status: DC

## 2017-03-04 MED ORDER — HYDROCORTISONE 2.5 % RE CREA: 1.0000 "application " | TOPICAL_CREAM | Freq: Two times a day (BID) | RECTAL | 0 refills | Status: DC

## 2017-03-04 MED ORDER — FEXOFENADINE HCL 180 MG PO TABS: 180.0000 mg | ORAL_TABLET | Freq: Every day | ORAL | 12 refills | Status: DC

## 2017-03-04 NOTE — Assessment & Plan Note (Signed)
The current medical regimen is effective;  continue present plan and medications.  

## 2017-03-04 NOTE — Assessment & Plan Note (Signed)
For reflux symptoms will try Protonix as patient on Plavix if no resolve of dysphagia will refer to GI.

## 2017-03-04 NOTE — Progress Notes (Signed)
BP 108/71   Pulse 71   Wt 214 lb (97.1 kg)   SpO2 98%   BMI 29.02 kg/m    Subjective:    Patient ID: Shawn Shepard, male    DOB: 1946-06-12, 71 y.o.   MRN: [id number]  HPI: Shawn Shepard is a 71 y.o. male  Chief Complaint  Patient presents with  . Acute Visit  . Hemorrhoids    Patient with 2 weeks of hemorrhoid discomfort and bulging and perianal area with some bleeding. Has tried some over-the-counter medications this was followed by being constipated and straining with bowel movements. Patient now taking stool softeners. Blood pressure no complaints takes medications faithfully without problems No coronary artery disease symptoms. Cholesterol no issues. Patient is having some dysphasia symptoms that may be getting worse hasn't been taking any reflux medications. Also bothered by nasal congestion drainage and allergy type symptoms not interested in any. Relevant past medical, surgical, family and social history reviewed and updated as indicated. Interim medical history since our last visit reviewed. Allergies and medications reviewed and updated.  Review of Systems  Constitutional: Negative.   Respiratory: Negative.   Cardiovascular: Negative.     Per HPI unless specifically indicated above     Objective:    BP 108/71   Pulse 71   Wt 214 lb (97.1 kg)   SpO2 98%   BMI 29.02 kg/m   Wt Readings from Last 3 Encounters:  03/04/17 214 lb (97.1 kg)  09/10/16 208 lb 3.2 oz (94.4 kg)  01/16/16 202 lb (91.6 kg)    Physical Exam  Constitutional: He is oriented to person, place, and time. He appears well-developed and well-nourished.  HENT:  Head: Normocephalic and atraumatic.  Eyes: Conjunctivae and EOM are normal.  Neck: Normal range of motion.  Cardiovascular: Normal rate, regular rhythm and normal heart sounds.   Pulmonary/Chest: Effort normal and breath sounds normal.  Genitourinary:  Genitourinary Comments: Thrombosed hemorrhoid visualized with incision  and drainage done with lidocaine with epinephrine and clot removed without difficulty and relief of symptoms for patient.  Musculoskeletal: Normal range of motion.  Neurological: He is alert and oriented to person, place, and time.  Skin: No erythema.  Psychiatric: He has a normal mood and affect. His behavior is normal. Judgment and thought content normal.    Results for orders placed or performed in visit on 09/10/16  CBC with Differential/Platelet  Result Value Ref Range   WBC 3.6 3.4 - 10.8 x10E3/uL   RBC 4.37 4.14 - 5.80 x10E6/uL   Hemoglobin 13.3 12.6 - 17.7 g/dL   Hematocrit 39.9 37.5 - 51.0 %   MCV 91 79 - 97 fL   MCH 30.4 26.6 - 33.0 pg   MCHC 33.3 31.5 - 35.7 g/dL   RDW 14.6 12.3 - 15.4 %   Platelets 159 150 - 379 x10E3/uL   Neutrophils 44 Not Estab. %   Lymphs 31 Not Estab. %   Monocytes 17 Not Estab. %   Eos 7 Not Estab. %   Basos 1 Not Estab. %   Neutrophils Absolute 1.6 1.4 - 7.0 x10E3/uL   Lymphocytes Absolute 1.1 0.7 - 3.1 x10E3/uL   Monocytes Absolute 0.6 0.1 - 0.9 x10E3/uL   EOS (ABSOLUTE) 0.3 0.0 - 0.4 x10E3/uL   Basophils Absolute 0.0 0.0 - 0.2 x10E3/uL   Immature Granulocytes 0 Not Estab. %   Immature Grans (Abs) 0.0 0.0 - 0.1 x10E3/uL  Comprehensive metabolic panel  Result Value Ref Range   Glucose  95 65 - 99 mg/dL   BUN 13 8 - 27 mg/dL   Creatinine, Ser 1.00 0.76 - 1.27 mg/dL   GFR calc non Af Amer 77 >59 mL/min/1.73   GFR calc Af Amer 89 >59 mL/min/1.73   BUN/Creatinine Ratio 13 10 - 24   Sodium 141 134 - 144 mmol/L   Potassium 4.2 3.5 - 5.2 mmol/L   Chloride 102 96 - 106 mmol/L   CO2 23 18 - 29 mmol/L   Calcium 10.0 8.6 - 10.2 mg/dL   Total Protein 6.2 6.0 - 8.5 g/dL   Albumin 4.1 3.6 - 4.8 g/dL   Globulin, Total 2.1 1.5 - 4.5 g/dL   Albumin/Globulin Ratio 2.0 1.2 - 2.2   Bilirubin Total 0.5 0.0 - 1.2 mg/dL   Alkaline Phosphatase 74 39 - 117 IU/L   AST 25 0 - 40 IU/L   ALT 22 0 - 44 IU/L  Lipid panel  Result Value Ref Range   Cholesterol,  Total 130 100 - 199 mg/dL   Triglycerides 103 0 - 149 mg/dL   HDL 47 >39 mg/dL   VLDL Cholesterol Cal 21 5 - 40 mg/dL   LDL Calculated 62 0 - 99 mg/dL   Chol/HDL Ratio 2.8 0.0 - 5.0 ratio units  PSA  Result Value Ref Range   Prostate Specific Ag, Serum 0.7 0.0 - 4.0 ng/mL  TSH  Result Value Ref Range   TSH 1.140 0.450 - 4.500 uIU/mL  Urinalysis, Routine w reflex microscopic (not at [facility])  Result Value Ref Range   Specific Gravity, UA 1.025 1.005 - 1.030   pH, UA 5.5 5.0 - 7.5   Color, UA Yellow Yellow   Appearance Ur Clear Clear   Leukocytes, UA Negative Negative   Protein, UA Negative Negative/Trace   Glucose, UA Negative Negative   Ketones, UA Negative Negative   RBC, UA Negative Negative   Bilirubin, UA Negative Negative   Urobilinogen, Ur 1.0 0.2 - 1.0 mg/dL   Nitrite, UA Negative Negative      Assessment & Plan:   Problem List Items Addressed This Visit      Cardiovascular and Mediastinum   CAD (coronary artery disease)    The current medical regimen is effective;  continue present plan and medications.       Hypertension    The current medical regimen is effective;  continue present plan and medications.       Relevant Orders   Basic metabolic panel     Digestive   Rectal bleeding    Thrombosed hemorrhoid with IND performed clot removal      Relevant Orders   CBC with Differential/Platelet   Reflux esophagitis    For reflux symptoms will try Protonix as patient on Plavix if no resolve of dysphagia will refer to GI.        Other   Hyperlipidemia    The current medical regimen is effective;  continue present plan and medications.       Relevant Orders   Lipid panel   ALT   AST       Follow up plan: Return in about 6 months (around 09/03/2017) for Physical Exam.

## 2017-03-04 NOTE — Assessment & Plan Note (Signed)
Thrombosed hemorrhoid with IND performed clot removal

## 2017-03-05 LAB — CBC WITH DIFFERENTIAL/PLATELET

## 2017-03-05 LAB — LIPID PANEL
Chol/HDL Ratio: 2.8 ratio (ref 0.0–5.0)
Cholesterol, Total: 128 mg/dL (ref 100–199)
HDL: 46 mg/dL (ref >39)
LDL Calculated: 59 mg/dL (ref 0–99)
TRIGLYCERIDES: 116 mg/dL (ref 0–149)
VLDL Cholesterol Cal: 23 mg/dL (ref 5–40)

## 2017-03-05 LAB — BASIC METABOLIC PANEL
BUN/Creatinine Ratio: 13 (ref 10–24)
BUN: 11 mg/dL (ref 8–27)
CALCIUM: 10.4 mg/dL — AB (ref 8.6–10.2)
CHLORIDE: 104 mmol/L (ref 96–106)
CO2: 21 mmol/L (ref 18–29)
Creatinine, Ser: 0.87 mg/dL (ref 0.76–1.27)
GFR calc Af Amer: 102 mL/min/{1.73_m2} (ref >59)
GFR, EST NON AFRICAN AMERICAN: 88 mL/min/{1.73_m2} (ref >59)
Glucose: 95 mg/dL (ref 65–99)
Potassium: 4.7 mmol/L (ref 3.5–5.2)
Sodium: 141 mmol/L (ref 134–144)

## 2017-03-05 LAB — ALT: ALT: 17 IU/L (ref 0–44)

## 2017-03-05 LAB — AST: AST: 22 IU/L (ref 0–40)

## 2017-03-11 ENCOUNTER — Ambulatory Visit: Payer: Medicare Other | Admitting: Family Medicine

## 2017-03-12 ENCOUNTER — Telehealth: Payer: Self-pay | Admitting: Family Medicine

## 2017-03-13 NOTE — Telephone Encounter (Signed)
Looks like all of his basic labs from 4/3 were most recent, all were normal - please call pt back and let him know they all looked fine. Thanks

## 2017-03-13 NOTE — Telephone Encounter (Signed)
Left message on machine for pt to return call to the office.  

## 2017-03-13 NOTE — Telephone Encounter (Signed)
Provider requested to speak with pt in regards to lab. Unsure what info he wanted relayed please advise.

## 2017-03-16 NOTE — Telephone Encounter (Signed)
Call pt come back for CBC, some mess up?

## 2017-03-17 ENCOUNTER — Ambulatory Visit: Payer: Self-pay | Admitting: Family Medicine

## 2017-03-17 NOTE — Telephone Encounter (Signed)
Left message on machine for pt to return call to the office.  

## 2017-03-18 ENCOUNTER — Telehealth: Payer: Self-pay

## 2017-03-18 DIAGNOSIS — E7800 Pure hypercholesterolemia, unspecified: Secondary | ICD-10-CM

## 2017-03-18 DIAGNOSIS — I10 Essential (primary) hypertension: Secondary | ICD-10-CM

## 2017-03-18 NOTE — Telephone Encounter (Addendum)
Number for scheduling left on vm to patient. [phone]

## 2017-03-18 NOTE — Telephone Encounter (Signed)
Pt called about a bone density order that he thought was placed. Do you know where these are done? Will they call? Please advise.

## 2017-03-18 NOTE — Telephone Encounter (Signed)
He does have a bone density ordered. Under order review. These along with mammograms, etc don't hit the [facility]. Bone Density's are usually done at [facility]

## 2017-04-08 ENCOUNTER — Other Ambulatory Visit: Payer: Medicare Other

## 2017-04-08 DIAGNOSIS — E7800 Pure hypercholesterolemia, unspecified: Secondary | ICD-10-CM

## 2017-04-08 DIAGNOSIS — I10 Essential (primary) hypertension: Secondary | ICD-10-CM

## 2017-04-09 ENCOUNTER — Encounter: Payer: Self-pay | Admitting: Family Medicine

## 2017-04-09 LAB — CBC WITH DIFFERENTIAL/PLATELET
BASOS ABS: 0 10*3/uL (ref 0.0–0.2)
Basos: 0 %
EOS (ABSOLUTE): 0.2 10*3/uL (ref 0.0–0.4)
Eos: 5 %
Hematocrit: 40.2 % (ref 37.5–51.0)
Hemoglobin: 13.5 g/dL (ref 13.0–17.7)
Immature Grans (Abs): 0 10*3/uL (ref 0.0–0.1)
Immature Granulocytes: 0 %
LYMPHS ABS: 0.9 10*3/uL (ref 0.7–3.1)
Lymphs: 27 %
MCH: 31.8 pg (ref 26.6–33.0)
MCHC: 33.6 g/dL (ref 31.5–35.7)
MCV: 95 fL (ref 79–97)
MONOCYTES: 18 %
Monocytes Absolute: 0.6 10*3/uL (ref 0.1–0.9)
NEUTROS ABS: 1.6 10*3/uL (ref 1.4–7.0)
NRBC: 1 % — AB (ref 0–0)
Neutrophils: 50 %
PLATELETS: 170 10*3/uL (ref 150–379)
RBC: 4.24 x10E6/uL (ref 4.14–5.80)
RDW: 14.5 % (ref 12.3–15.4)
WBC: 3.2 10*3/uL — AB (ref 3.4–10.8)

## 2017-05-27 ENCOUNTER — Ambulatory Visit
Admission: RE | Admit: 2017-05-27 | Discharge: 2017-05-27 | Disposition: A | Discharge status: Home / Self Care | Payer: Medicare Other | Source: Ambulatory Visit | Attending: Family Medicine | Admitting: Family Medicine

## 2017-05-27 ENCOUNTER — Telehealth: Payer: Self-pay | Admitting: Family Medicine

## 2017-05-27 DIAGNOSIS — Z9189 Other specified personal risk factors, not elsewhere classified: Secondary | ICD-10-CM

## 2017-05-27 DIAGNOSIS — M8588 Other specified disorders of bone density and structure, other site: Secondary | ICD-10-CM | POA: Diagnosis not present

## 2017-05-27 DIAGNOSIS — Z13820 Encounter for screening for osteoporosis: Secondary | ICD-10-CM | POA: Insufficient documentation

## 2017-05-27 MED ORDER — ALENDRONATE SODIUM 35 MG PO TABS: 35.0000 mg | ORAL_TABLET | ORAL | 12 refills | Status: DC

## 2017-05-27 NOTE — Telephone Encounter (Signed)
Phone call Discussed with patient bone density test done because of loss of height. Bone density showing osteopenia. Because of height loss and osteopenia will start Fosamax. Discussed calcium and vitamin D.

## 2017-06-23 ENCOUNTER — Encounter: Payer: Self-pay | Admitting: Family Medicine

## 2017-06-23 ENCOUNTER — Ambulatory Visit (INDEPENDENT_AMBULATORY_CARE_PROVIDER_SITE_OTHER): Payer: Medicare Other | Admitting: Family Medicine

## 2017-06-23 DIAGNOSIS — M1710 Unilateral primary osteoarthritis, unspecified knee: Secondary | ICD-10-CM | POA: Diagnosis not present

## 2017-06-23 MED ORDER — MELOXICAM 15 MG PO TABS: 15.0000 mg | ORAL_TABLET | Freq: Every day | ORAL | 3 refills | Status: DC

## 2017-06-23 NOTE — Progress Notes (Signed)
BP 120/81   Pulse 67   Ht 6\' 1"  (1.854 m)   Wt 210 lb 12.8 oz (95.6 kg)   SpO2 98%   BMI 27.81 kg/m    Subjective:    Patient ID: Shawn Shepard, male    DOB: 05-08-1946, 71 y.o.   MRN: [id number]  HPI: Shawn Shepard is a 71 y.o. male  Chief Complaint  Patient presents with  . Knee Pain    both, left much worse.    Patient with bilateral knee issues pretty much going on all year. No real clicking locking. Does seem to give away and feel unstable especially when walking playing golf on unstable turf.. Also when prolonged pressure on one knee gets weak and feels like it's going to give way. Has fallen one time but not really related to his knees. About a month or so ago had some kind of irritation to his left knee which resulted in swelling started Aleve and is felt better. To play golf is required to use knee brace is to aid instability.  Relevant past medical, surgical, family and social history reviewed and updated as indicated. Interim medical history since our last visit reviewed. Allergies and medications reviewed and updated.  Review of Systems  Constitutional: Negative.   Respiratory: Negative.   Cardiovascular: Negative.     Per HPI unless specifically indicated above     Objective:    BP 120/81   Pulse 67   Ht 6\' 1"  (1.854 m)   Wt 210 lb 12.8 oz (95.6 kg)   SpO2 98%   BMI 27.81 kg/m   Wt Readings from Last 3 Encounters:  06/23/17 210 lb 12.8 oz (95.6 kg)  03/04/17 214 lb (97.1 kg)  09/10/16 208 lb 3.2 oz (94.4 kg)    Physical Exam  Constitutional: He is oriented to person, place, and time. He appears well-developed and well-nourished.  HENT:  Head: Normocephalic and atraumatic.  Eyes: Conjunctivae and EOM are normal.  Neck: Normal range of motion.  Cardiovascular: Normal rate, regular rhythm and normal heart sounds.   Pulmonary/Chest: Effort normal and breath sounds normal.  Musculoskeletal: Normal range of motion.  Knee exams with no joint  instability for range of motion no clicking locking giving way some joint line tenderness medially especially on the left knee more patellar tendons tenderness on the right knee. No evidence of effusion.  Neurological: He is alert and oriented to person, place, and time.  Skin: No erythema.  Psychiatric: He has a normal mood and affect. His behavior is normal. Judgment and thought content normal.    Results for orders placed or performed in visit on 04/08/17  CBC with Differential/Platelet  Result Value Ref Range   WBC 3.2 (L) 3.4 - 10.8 x10E3/uL   RBC 4.24 4.14 - 5.80 x10E6/uL   Hemoglobin 13.5 13.0 - 17.7 g/dL   Hematocrit 40.2 37.5 - 51.0 %   MCV 95 79 - 97 fL   MCH 31.8 26.6 - 33.0 pg   MCHC 33.6 31.5 - 35.7 g/dL   RDW 14.5 12.3 - 15.4 %   Platelets 170 150 - 379 x10E3/uL   Neutrophils 50 Not Estab. %   Lymphs 27 Not Estab. %   Monocytes 18 Not Estab. %   Eos 5 Not Estab. %   Basos 0 Not Estab. %   Neutrophils Absolute 1.6 1.4 - 7.0 x10E3/uL   Lymphocytes Absolute 0.9 0.7 - 3.1 x10E3/uL   Monocytes Absolute 0.6 0.1 - 0.9  x10E3/uL   EOS (ABSOLUTE) 0.2 0.0 - 0.4 x10E3/uL   Basophils Absolute 0.0 0.0 - 0.2 x10E3/uL   Immature Granulocytes 0 Not Estab. %   Immature Grans (Abs) 0.0 0.0 - 0.1 x10E3/uL   NRBC 1 (H) 0 - 0 %   Hematology Comments: Note:       Assessment & Plan:   Problem List Items Addressed This Visit    None       Follow up plan: No Follow-up on file.

## 2017-06-23 NOTE — Assessment & Plan Note (Signed)
Discuss knee arthritis patient will continue knee braces will refer to orthopedics for further evaluation with x-rays and our consideration of physical therapy We'll give meloxicam for now.

## 2017-07-04 ENCOUNTER — Other Ambulatory Visit: Payer: Self-pay | Admitting: Family Medicine

## 2017-07-07 NOTE — Telephone Encounter (Signed)
Last (acute) OV: 06/23/17 Last routine OV: 03/04/17 Next OV: 09/16/17

## 2017-08-06 ENCOUNTER — Inpatient Hospital Stay
Admission: EM | Admit: 2017-08-06 | Discharge: 2017-08-08 | DRG: 287 | Disposition: A | Discharge status: Home / Self Care | Payer: Medicare Other | Attending: Internal Medicine | Admitting: Internal Medicine

## 2017-08-06 ENCOUNTER — Encounter: Payer: Self-pay | Admitting: *Deleted

## 2017-08-06 ENCOUNTER — Emergency Department: Payer: Medicare Other

## 2017-08-06 DIAGNOSIS — Z79899 Other long term (current) drug therapy: Secondary | ICD-10-CM

## 2017-08-06 DIAGNOSIS — I472 Ventricular tachycardia, unspecified: Secondary | ICD-10-CM

## 2017-08-06 DIAGNOSIS — I429 Cardiomyopathy, unspecified: Secondary | ICD-10-CM | POA: Diagnosis present

## 2017-08-06 DIAGNOSIS — Z7982 Long term (current) use of aspirin: Secondary | ICD-10-CM | POA: Diagnosis not present

## 2017-08-06 DIAGNOSIS — Z85828 Personal history of other malignant neoplasm of skin: Secondary | ICD-10-CM

## 2017-08-06 DIAGNOSIS — E785 Hyperlipidemia, unspecified: Secondary | ICD-10-CM | POA: Diagnosis present

## 2017-08-06 DIAGNOSIS — I493 Ventricular premature depolarization: Secondary | ICD-10-CM | POA: Diagnosis present

## 2017-08-06 DIAGNOSIS — R079 Chest pain, unspecified: Secondary | ICD-10-CM

## 2017-08-06 DIAGNOSIS — I10 Essential (primary) hypertension: Secondary | ICD-10-CM | POA: Diagnosis present

## 2017-08-06 DIAGNOSIS — Z7902 Long term (current) use of antithrombotics/antiplatelets: Secondary | ICD-10-CM | POA: Diagnosis not present

## 2017-08-06 DIAGNOSIS — Z87891 Personal history of nicotine dependence: Secondary | ICD-10-CM

## 2017-08-06 DIAGNOSIS — I4891 Unspecified atrial fibrillation: Secondary | ICD-10-CM | POA: Diagnosis present

## 2017-08-06 DIAGNOSIS — Z9861 Coronary angioplasty status: Secondary | ICD-10-CM

## 2017-08-06 DIAGNOSIS — I2510 Atherosclerotic heart disease of native coronary artery without angina pectoris: Secondary | ICD-10-CM | POA: Diagnosis present

## 2017-08-06 LAB — BASIC METABOLIC PANEL
ANION GAP: 8 (ref 5–15)
BUN: 13 mg/dL (ref 6–20)
CHLORIDE: 105 mmol/L (ref 101–111)
CO2: 24 mmol/L (ref 22–32)
Calcium: 10.6 mg/dL — ABNORMAL HIGH (ref 8.9–10.3)
Creatinine, Ser: 0.98 mg/dL (ref 0.61–1.24)
GFR calc non Af Amer: >60 mL/min (ref >60)
Glucose, Bld: 97 mg/dL (ref 65–99)
POTASSIUM: 4.2 mmol/L (ref 3.5–5.1)
Sodium: 137 mmol/L (ref 135–145)

## 2017-08-06 LAB — CBC
HCT: 40.9 % (ref 40.0–52.0)
HEMOGLOBIN: 14 g/dL (ref 13.0–18.0)
MCH: 31.5 pg (ref 26.0–34.0)
MCHC: 34.2 g/dL (ref 32.0–36.0)
MCV: 91.9 fL (ref 80.0–100.0)
Platelets: 188 10*3/uL (ref 150–440)
RBC: 4.45 MIL/uL (ref 4.40–5.90)
RDW: 13.8 % (ref 11.5–14.5)
WBC: 4.6 10*3/uL (ref 3.8–10.6)

## 2017-08-06 LAB — PROTIME-INR
INR: 0.97
Prothrombin Time: 12.8 seconds (ref 11.4–15.2)

## 2017-08-06 LAB — LIPASE, BLOOD: Lipase: 46 U/L (ref 11–51)

## 2017-08-06 LAB — HEPATIC FUNCTION PANEL
ALT: 19 U/L (ref 17–63)
AST: 26 U/L (ref 15–41)
Albumin: 3.9 g/dL (ref 3.5–5.0)
Alkaline Phosphatase: 59 U/L (ref 38–126)
BILIRUBIN DIRECT: 0.1 mg/dL (ref 0.1–0.5)
BILIRUBIN TOTAL: 0.5 mg/dL (ref 0.3–1.2)
Indirect Bilirubin: 0.4 mg/dL (ref 0.3–0.9)
Total Protein: 6.6 g/dL (ref 6.5–8.1)

## 2017-08-06 LAB — MRSA PCR SCREENING: MRSA by PCR: NEGATIVE

## 2017-08-06 LAB — TROPONIN I
Troponin I: <0.03 ng/mL (ref <0.03)
Troponin I: 0.05 ng/mL (ref <0.03)

## 2017-08-06 LAB — APTT: aPTT: 31 seconds (ref 24–36)

## 2017-08-06 LAB — MAGNESIUM: MAGNESIUM: 1.6 mg/dL — AB (ref 1.7–2.4)

## 2017-08-06 MED ORDER — PANTOPRAZOLE SODIUM 40 MG PO TBEC
40.0000 mg | DELAYED_RELEASE_TABLET | Freq: Every day | ORAL | Status: DC
  Administered 2017-08-07 – 2017-08-08 (×2): 40 mg via ORAL
  Filled 2017-08-06 (×2): qty 1

## 2017-08-06 MED ORDER — ASPIRIN EC 81 MG PO TBEC
81.0000 mg | DELAYED_RELEASE_TABLET | Freq: Every day | ORAL | Status: DC
  Administered 2017-08-06 – 2017-08-08 (×3): 81 mg via ORAL
  Filled 2017-08-06 (×3): qty 1

## 2017-08-06 MED ORDER — ALBUTEROL SULFATE (2.5 MG/3ML) 0.083% IN NEBU: 2.5000 mg | INHALATION_SOLUTION | RESPIRATORY_TRACT | Status: DC | PRN

## 2017-08-06 MED ORDER — CLOPIDOGREL BISULFATE 75 MG PO TABS
75.0000 mg | ORAL_TABLET | Freq: Every day | ORAL | Status: DC
  Administered 2017-08-07 – 2017-08-08 (×2): 75 mg via ORAL
  Filled 2017-08-06 (×3): qty 1

## 2017-08-06 MED ORDER — AMIODARONE HCL IN DEXTROSE 360-4.14 MG/200ML-% IV SOLN
INTRAVENOUS | Status: AC
  Administered 2017-08-06: 17:00:00
  Filled 2017-08-06: qty 200

## 2017-08-06 MED ORDER — NIACIN ER (ANTIHYPERLIPIDEMIC) 500 MG PO TBCR
1000.0000 mg | EXTENDED_RELEASE_TABLET | Freq: Every day | ORAL | Status: DC
  Administered 2017-08-06: 1000 mg via ORAL
  Filled 2017-08-06 (×3): qty 2

## 2017-08-06 MED ORDER — HYDROCORTISONE 2.5 % RE CREA
1.0000 "application " | TOPICAL_CREAM | Freq: Two times a day (BID) | RECTAL | Status: DC
  Administered 2017-08-07: 1 via RECTAL
  Filled 2017-08-06: qty 28.35

## 2017-08-06 MED ORDER — AMIODARONE HCL 150 MG/3ML IV SOLN: 60.0000 mg/h | Freq: Once | INTRAVENOUS | Status: DC

## 2017-08-06 MED ORDER — AMIODARONE IV BOLUS ONLY 150 MG/100ML
150.0000 mg | Freq: Once | INTRAVENOUS | Status: AC
  Administered 2017-08-06: 150 mg via INTRAVENOUS

## 2017-08-06 MED ORDER — AMIODARONE IV BOLUS ONLY 150 MG/100ML
150.0000 mg | Freq: Once | INTRAVENOUS | Status: AC
  Administered 2017-08-06: 150 mg via INTRAVENOUS
  Filled 2017-08-06: qty 100

## 2017-08-06 MED ORDER — BISACODYL 10 MG RE SUPP: 10.0000 mg | Freq: Every day | RECTAL | Status: DC | PRN

## 2017-08-06 MED ORDER — MAGNESIUM SULFATE IN D5W 1-5 GM/100ML-% IV SOLN
1.0000 g | Freq: Once | INTRAVENOUS | Status: AC
  Administered 2017-08-06: 1 g via INTRAVENOUS
  Filled 2017-08-06: qty 100

## 2017-08-06 MED ORDER — AMIODARONE HCL IN DEXTROSE 360-4.14 MG/200ML-% IV SOLN
30.0000 mg/h | INTRAVENOUS | Status: DC
  Administered 2017-08-06 – 2017-08-07 (×2): 30 mg/h via INTRAVENOUS
  Filled 2017-08-06 (×2): qty 200

## 2017-08-06 MED ORDER — ACETAMINOPHEN 650 MG RE SUPP: 650.0000 mg | Freq: Four times a day (QID) | RECTAL | Status: DC | PRN

## 2017-08-06 MED ORDER — ONDANSETRON HCL 4 MG PO TABS: 4.0000 mg | ORAL_TABLET | Freq: Four times a day (QID) | ORAL | Status: DC | PRN

## 2017-08-06 MED ORDER — METOPROLOL SUCCINATE ER 50 MG PO TB24
50.0000 mg | ORAL_TABLET | Freq: Every day | ORAL | Status: DC
  Administered 2017-08-06 – 2017-08-07 (×2): 50 mg via ORAL
  Filled 2017-08-06 (×2): qty 1

## 2017-08-06 MED ORDER — AMIODARONE IV BOLUS ONLY 150 MG/100ML
INTRAVENOUS | Status: AC
  Filled 2017-08-06: qty 100

## 2017-08-06 MED ORDER — RAMIPRIL 5 MG PO CAPS
5.0000 mg | ORAL_CAPSULE | Freq: Every day | ORAL | Status: DC
  Administered 2017-08-07 – 2017-08-08 (×2): 5 mg via ORAL
  Filled 2017-08-06 (×2): qty 1

## 2017-08-06 MED ORDER — ATORVASTATIN CALCIUM 20 MG PO TABS
80.0000 mg | ORAL_TABLET | Freq: Every day | ORAL | Status: DC
  Administered 2017-08-06 – 2017-08-07 (×2): 80 mg via ORAL
  Filled 2017-08-06 (×2): qty 4

## 2017-08-06 MED ORDER — POLYETHYLENE GLYCOL 3350 17 G PO PACK: 17.0000 g | PACK | Freq: Every day | ORAL | Status: DC | PRN

## 2017-08-06 MED ORDER — ONDANSETRON HCL 4 MG/2ML IJ SOLN: 4.0000 mg | Freq: Four times a day (QID) | INTRAMUSCULAR | Status: DC | PRN

## 2017-08-06 MED ORDER — AMIODARONE HCL IN DEXTROSE 360-4.14 MG/200ML-% IV SOLN
60.0000 mg/h | INTRAVENOUS | Status: AC
  Administered 2017-08-06 (×2): 60 mg/h via INTRAVENOUS

## 2017-08-06 MED ORDER — ACETAMINOPHEN 325 MG PO TABS
650.0000 mg | ORAL_TABLET | Freq: Four times a day (QID) | ORAL | Status: DC | PRN
  Administered 2017-08-07: 650 mg via ORAL

## 2017-08-06 MED ORDER — ENOXAPARIN SODIUM 40 MG/0.4ML ~~LOC~~ SOLN
40.0000 mg | SUBCUTANEOUS | Status: DC
  Administered 2017-08-06 – 2017-08-07 (×2): 40 mg via SUBCUTANEOUS
  Filled 2017-08-06 (×2): qty 0.4

## 2017-08-06 MED ORDER — SODIUM CHLORIDE 0.9% FLUSH
3.0000 mL | Freq: Two times a day (BID) | INTRAVENOUS | Status: DC
  Administered 2017-08-06 – 2017-08-08 (×4): 3 mL via INTRAVENOUS

## 2017-08-06 MED ORDER — MELOXICAM 7.5 MG PO TABS
15.0000 mg | ORAL_TABLET | Freq: Every day | ORAL | Status: DC
  Administered 2017-08-07 – 2017-08-08 (×2): 15 mg via ORAL
  Filled 2017-08-06: qty 1
  Filled 2017-08-06: qty 2
  Filled 2017-08-06: qty 1

## 2017-08-06 MED ORDER — OXYCODONE HCL 5 MG PO TABS: 5.0000 mg | ORAL_TABLET | ORAL | Status: DC | PRN

## 2017-08-06 NOTE — Consult Note (Signed)
Name: Shawn Shepard MRN: [id number] DOB: 1945/12/10    ADMISSION DATE:  08/06/2017  CONSULTATION DATE:  08/06/17  REFERRING MD :  Dr. Darvin Neighbours  CHIEF COMPLAINT:  Chest Pain  BRIEF PATIENT DESCRIPTION: 71 year old male with CAD,Hypertension and Atrial Fibrillation, now presenting with sustained V-TACH  In 170's requiring amiodarone drip.  SIGNIFICANT EVENTS  9/5  70 year pld male with sustained v-tach requiring amiodarone drip.  Placed in the SDU for observation overnight  STUDIES:  None  HISTORY OF PRESENT ILLNESS:  Shawn Shepard is a 71 year old male with known history of CAD,Hypertension, atrial fibrillation and Hyperlipidemia.  Patient presents to ED with complaints of acute onset of palpitations,nausea,dizziness and chest pain while mowing his lawn.  EMS was called and patient was found to be in sustained v-tach. Repeat EKG was NSR.  Cardiology was consulted and patient was sent to the SDU on amiodarone for observation overnight.  PAST MEDICAL HISTORY :   has a past medical history of Basal cell carcinoma; CAD (coronary artery disease); Hyperlipidemia; Hypertension; and Shingles.  has a past surgical history that includes Skin cancer excision (1999); Pilonidal cyst excision (1977); Ganglion cyst excision (1979); Coronary angioplasty (2000 and 2003); Vein Surgery (2000); and Cardiac catheterization (N/A, 01/16/2016). Prior to Admission medications   Medication Sig Start Date End Date Taking? Authorizing Provider  aspirin EC 81 MG tablet Take 81 mg by mouth daily.   Yes [provider]  atorvastatin (LIPITOR) 80 MG tablet Take 1 tablet (80 mg total) by mouth at bedtime. 09/10/16  Yes Crissman, Jeannette How, MD  clopidogrel (PLAVIX) 75 MG tablet Take 1 tablet (75 mg total) by mouth daily. 09/10/16  Yes Crissman, Jeannette How, MD  fexofenadine (ALLEGRA) 180 MG tablet Take 1 tablet (180 mg total) by mouth daily. 03/04/17  Yes Crissman, Jeannette How, MD  meloxicam (MOBIC) 15 MG tablet Take 1  tablet (15 mg total) by mouth daily. 06/23/17  Yes Crissman, Jeannette How, MD  metoprolol succinate (TOPROL-XL) 50 MG 24 hr tablet Take 1 tablet (50 mg total) by mouth daily. Take with or immediately following a meal. 09/10/16  Yes Crissman, Jeannette How, MD  niacin (NIASPAN) 1000 MG CR tablet Take 1 tablet (1,000 mg total) by mouth at bedtime. 09/10/16  Yes [name], MD  pantoprazole (PROTONIX) 40 MG tablet take 1 tablet by mouth once daily 07/07/17  Yes Crissman, Jeannette How, MD  ramipril (ALTACE) 5 MG capsule take 1 capsule by mouth once daily 08/19/16  Yes Crissman, Jeannette How, MD  alendronate (FOSAMAX) 35 MG tablet Take 1 tablet (35 mg total) by mouth every 7 (seven) days. Take with a full glass of water on an empty stomach. Patient not taking: Reported on 08/06/2017 05/27/17   [name], MD  hydrocortisone (ANUSOL-HC) 2.5 % rectal cream Place 1 application rectally 2 (two) times daily. 03/04/17   [name], MD   No Known Allergies  FAMILY HISTORY:  family history includes Alzheimer's disease in his father; Congestive Heart Failure in his brother; Diabetes in his mother; Heart attack in his mother; Heart disease in his mother; Hypertension in his mother. SOCIAL HISTORY:  reports that he quit smoking about 46 years ago. His smoking use included Cigarettes. He quit smokeless tobacco use about 37 years ago. His smokeless tobacco use included Chew. He reports that he drinks alcohol. He reports that he does not use drugs.  REVIEW OF SYSTEMS:   Constitutional: Negative for fever, chills, weight  loss, malaise/fatigue and diaphoresis.  HENT: Negative for hearing loss, ear pain, nosebleeds, congestion, sore throat, neck pain, tinnitus and ear discharge.   Eyes: Negative for blurred vision, double vision, photophobia, pain, discharge and redness.  Respiratory: Negative for cough, hemoptysis, sputum production, shortness of breath, wheezing and stridor.   Cardiovascular: Negative for chest pain, palpitations,  orthopnea, claudication, leg swelling and PND.  Gastrointestinal: Negative for heartburn, nausea, vomiting, abdominal pain, diarrhea, constipation, blood in stool and melena.  Genitourinary: Negative for dysuria, urgency, frequency, hematuria and flank pain.  Musculoskeletal: Negative for myalgias, back pain, joint pain and falls.  Skin: Negative for itching and rash.  Neurological: Negative for dizziness, tingling, tremors, sensory change, speech change, focal weakness, seizures, loss of consciousness, weakness and headaches.  Endo/Heme/Allergies: Negative for environmental allergies and polydipsia. Does not bruise/bleed easily.  SUBJECTIVE: Patient denies any chest pain or discomfort at this time  VITAL SIGNS: Temp:  [98 F (36.7 C)] 98 F (36.7 C) (09/05 1626) Pulse Rate:  [61-182] 66 (09/05 2000) Resp:  [13-27] 13 (09/05 2000) BP: (98-170)/(69-91) 143/87 (09/05 2000) SpO2:  [96 %-100 %] 100 % (09/05 2000) Weight:  [93 kg (205 lb)] 93 kg (205 lb) (09/05 1624)  PHYSICAL EXAMINATION: General:  71 year old male in no acute distress Neuro:  Awake,Alert and oriented HEENT:  AT,[place],no jvd Cardiovascular:  S1s2,regular, no JVD Lungs: Clear bilaterally, no wheezes,crackles and rhonchi Abdomen:  Soft,NT,ND,positive bowel sounds Musculoskeletal:  Mild b/l pitting edema Skin:  Warm ,dry and intact   Recent Labs Lab 08/06/17 1625  NA 137  K 4.2  CL 105  CO2 24  BUN 13  CREATININE 0.98  GLUCOSE 97    Recent Labs Lab 08/06/17 1625  HGB 14.0  HCT 40.9  WBC 4.6  PLT 188   Dg Chest Port 1 View  Result Date: 08/06/2017 CLINICAL DATA:  Generalized chest pain that began today while doing yard work. EXAM: PORTABLE CHEST 1 VIEW COMPARISON:  None. FINDINGS: Heart size is normal. Slight tortuosity of the aorta. The pulmonary vascularity is normal. Lungs are clear. No abnormal bone finding. Artifact overlies the chest. IMPRESSION: No active disease evident. Electronically Signed   By:  Nelson Chimes M.D.   On: 08/06/2017 16:54    ASSESSMENT / PLAN:  Sustained Ventricular Tachycardia , NOW SR after 2 doses of amiodarone. Hypertension Hyperlipdemia  Plan Cardiology consulted F/U ECHO Trend Troponin Continue Amiodarone gtt Continue Aspirin,plavix,atorvastatin and ramipril.  Bincy Varughese,AG-ACNP Pulmonary & Critical Care Pulmonary and [place]   08/06/2017, 8:53 PM

## 2017-08-06 NOTE — ED Provider Notes (Signed)
[facility] Emergency Department Provider Note  ____________________________________________   First MD Initiated Contact with Patient 08/06/17 [phone]     (approximate)  I have reviewed the triage vital signs and the nursing notes.   HISTORY  Chief Complaint Chest Pain  Level 5 caveat:  history/ROS limited by acute/critical illness  HPI Shawn Shepard is a 71 y.o. male with a history of coronary artery disease but with no stents and no prior MI who goes to Dr. Neoma Laming.  He presents by private vehicle with acute onset palpitations, severe chest pressure, some sharp chest pain, shortness of breath, and diaphoresis.  These symptoms started approximately an hour and a half prior to ED arrival while he was mowing the lawn with a push mower in the 90+ degree heat.    Nothing in particular makes the patient's symptoms better nor worse.  He did not want to come to the emergency department but when his symptoms continued and are severe, his wife finally convinced him.  He is alert and oriented upon arrival but somewhat ill-appearing.  Of note he was scheduled for an outpatient stress test by his cardiologist in 2 days.  He takes low-dose aspirin and Plavix but does not have stents and has had cardiac catheterization in the past.  He has not been ill recently and denies any fever/chills, chest pain, nor shortness of breath prior to this episode.  No nausea/vomiting and no abdominal pain   Past Medical History:  Diagnosis Date  . Basal cell carcinoma   . CAD (coronary artery disease)   . Hyperlipidemia   . Hypertension   . Shingles     Patient Active Problem List   Diagnosis Date Noted  . Arthritis of knee 06/23/2017  . Rectal bleeding 03/04/2017  . Reflux esophagitis 03/04/2017  . CAD (coronary artery disease)   . Basal cell carcinoma   . Hyperlipidemia   . Hypertension   . Shingles     Past Surgical History:  Procedure Laterality Date  . CARDIAC  CATHETERIZATION N/A 01/16/2016   Procedure: Left Heart Cath and Coronary Angiography;  Surgeon: [name], MD;  Location: [place] [facility];  Service: Cardiovascular;  Laterality: N/A;  . CORONARY ANGIOPLASTY  2000 and 2003  . GANGLION CYST EXCISION  1979  . PILONIDAL CYST EXCISION  1977  . SKIN CANCER EXCISION  1999   ear  . VEIN SURGERY  2000   laser    Prior to Admission medications   Medication Sig Start Date End Date Taking? Authorizing Provider  aspirin EC 81 MG tablet Take 81 mg by mouth daily.   Yes [provider]  atorvastatin (LIPITOR) 80 MG tablet Take 1 tablet (80 mg total) by mouth at bedtime. 09/10/16  Yes Crissman, Jeannette How, MD  clopidogrel (PLAVIX) 75 MG tablet Take 1 tablet (75 mg total) by mouth daily. 09/10/16  Yes Crissman, Jeannette How, MD  fexofenadine (ALLEGRA) 180 MG tablet Take 1 tablet (180 mg total) by mouth daily. 03/04/17  Yes Crissman, Jeannette How, MD  meloxicam (MOBIC) 15 MG tablet Take 1 tablet (15 mg total) by mouth daily. 06/23/17  Yes Crissman, Jeannette How, MD  metoprolol succinate (TOPROL-XL) 50 MG 24 hr tablet Take 1 tablet (50 mg total) by mouth daily. Take with or immediately following a meal. 09/10/16  Yes Crissman, Jeannette How, MD  niacin (NIASPAN) 1000 MG CR tablet Take 1 tablet (1,000 mg total) by mouth at bedtime. 09/10/16  Yes Crissman,  Jeannette How, MD  pantoprazole (PROTONIX) 40 MG tablet take 1 tablet by mouth once daily 07/07/17  Yes Crissman, Jeannette How, MD  ramipril (ALTACE) 5 MG capsule take 1 capsule by mouth once daily 08/19/16  Yes Crissman, Jeannette How, MD  alendronate (FOSAMAX) 35 MG tablet Take 1 tablet (35 mg total) by mouth every 7 (seven) days. Take with a full glass of water on an empty stomach. Patient not taking: Reported on 08/06/2017 05/27/17   [name], MD  hydrocortisone (ANUSOL-HC) 2.5 % rectal cream Place 1 application rectally 2 (two) times daily. 03/04/17   [name], MD    Allergies Patient has no known allergies.  Family History    Problem Relation Age of Onset  . Hypertension Mother   . Heart disease Mother   . Heart attack Mother   . Diabetes Mother   . Alzheimer's disease Father   . Congestive Heart Failure Brother     Social History Social History  Substance Use Topics  . Smoking status: Former Smoker    Types: Cigarettes    Quit date: 06/21/1971  . Smokeless tobacco: Former Systems developer    Types: Chew    Quit date: 06/20/1980  . Alcohol use Yes     Comment: 1-2 a week    Review of Systems Level 5 caveat:  history/ROS limited by acute/critical illness, but the patient does endorse chest pain and pressure as well as shortness of breath and palpitations  ____________________________________________   PHYSICAL EXAM:  VITAL SIGNS: ED Triage Vitals  Enc Vitals Group     BP 08/06/17 1626 98/69     Pulse Rate 08/06/17 1626 61     Resp 08/06/17 1626 18     Temp 08/06/17 1626 98 F (36.7 C)     Temp Source 08/06/17 1626 Oral     SpO2 08/06/17 1626 99 %     Weight 08/06/17 1624 93 kg (205 lb)     Height 08/06/17 1624 1.88 m (6\' 2" )     Head Circumference --      Peak Flow --      Pain Score 08/06/17 1623 3     Pain Loc --      Pain Edu? --      Excl. in [place]? --     Constitutional: Alert and oriented. Pale and mildly diaphoretic, somewhat ill-appearing but alert and oriented and acting appropriate Eyes: Conjunctivae are normal.  Head: Atraumatic. Nose: No congestion/rhinnorhea. Cardiovascular: Severe tachycardia, regular rhythm, wide complex on EKG.  Respiratory: Normal respiratory effort.  No retractions. Lungs CTAB. Gastrointestinal: Soft and nontender. No distention.  Musculoskeletal: No lower extremity tenderness nor edema. No gross deformities of extremities. Neurologic:  Normal speech and language. No gross focal neurologic deficits are appreciated.  Skin:  Skin is warm, mildly diaphoretic , pale, and intact. No rash noted. Psychiatric: Mood and affect are normal. Speech and behavior are  normal.  ____________________________________________   LABS (all labs ordered are listed, but only abnormal results are displayed)  Labs Reviewed  BASIC METABOLIC PANEL - Abnormal; Notable for the following:       Result Value   Calcium 10.6 (*)    All other components within normal limits  CBC  TROPONIN I  HEPATIC FUNCTION PANEL  LIPASE, BLOOD  PROTIME-INR  APTT  MAGNESIUM   ____________________________________________  EKG  ED ECG REPORT #1 I, Annjanette Wertenberger, the attending physician, personally viewed and interpreted this ECG.  Date: 08/06/2017 EKG Time: 16:22 Rate:  185 Rhythm: wide complex tachycardia, likely ventricular tachycardia QRS Axis: normal Intervals: LVH, QRS widening ST/T Wave abnormalities: marked ST abnormality, likely rate-related demand ischemia Narrative Interpretation: sustained v. tach with rate-related demand ischemia   ED ECG REPORT #2 I, Areg Bialas, the attending physician, personally viewed and interpreted this ECG.  Date: 08/06/2017 EKG Time: 16:41 Rate: 182 (This ECG was sped up to help clarify the underlying rhythm) Rhythm: wide complex tachycardia, likely ventricular tachycardia QRS Axis: normal Intervals: LVH, QRS widening ST/T Wave abnormalities: marked ST abnormality, likely rate-related demand ischemia Narrative Interpretation: sustained v. tach with rate-related demand ischemia   ED ECG REPORT #3 I, Jakhari Space, the attending physician, personally viewed and interpreted this ECG.  (Post-conversion after 2nd bolus of amiodarone 150 mg IV) Date: 08/06/2017 EKG Time: 17:30 (time on ECG machine is incorrect) Rate: 81 Rhythm: normal sinus rhythm QRS Axis: normal Intervals: normal ST/T Wave abnormalities: normal Narrative Interpretation: no evidence of acute ischemia  ____________________________________________  RADIOLOGY   Dg Chest Port 1 View  Result Date: 08/06/2017 CLINICAL DATA:  Generalized chest pain that  began today while doing yard work. EXAM: PORTABLE CHEST 1 VIEW COMPARISON:  None. FINDINGS: Heart size is normal. Slight tortuosity of the aorta. The pulmonary vascularity is normal. Lungs are clear. No abnormal bone finding. Artifact overlies the chest. IMPRESSION: No active disease evident. Electronically Signed   By: Nelson Chimes M.D.   On: 08/06/2017 16:54    ____________________________________________   PROCEDURES  Critical Care performed: Yes, see critical care procedure note(s)   Procedure(s) performed:   .Critical Care Performed by: Hinda Kehr Authorized by: Hinda Kehr   Critical care provider statement:    Critical care time (minutes):  60   Critical care time was exclusive of:  Separately billable procedures and treating other patients   Critical care was necessary to treat or prevent imminent or life-threatening deterioration of the following conditions:  Circulatory failure and cardiac failure   Critical care was time spent personally by me on the following activities:  Development of treatment plan with patient or surrogate, discussions with consultants, evaluation of patient's response to treatment, examination of patient, obtaining history from patient or surrogate, ordering and performing treatments and interventions, ordering and review of laboratory studies, ordering and review of radiographic studies, pulse oximetry, re-evaluation of patient's condition and review of old charts     ____________________________________________   INITIAL IMPRESSION / [place] / ED COURSE  Pertinent labs & imaging results that were available during my care of the patient were reviewed by me and considered in my medical decision making (see chart for details).  Due to the acuity of this patient's disease process as well as multiple other concurrent patients in the emergency department, I was not able to document the evaluation and treatment in real time.  In summary,  the patient arrived alert and oriented, pale, mildly diaphoretic, with ongoing chest pain and a heart rate of about 185.  His EKG demonstrated a wide complex tachycardia concerning for V. tach.  His blood pressure was borderline hypotensive but he was relatively stable and had been in his arrhythmia reportedly for at least an hour and a half prior to arrival.  He was hooked up to the [place] as well as the regular cardiac monitor and IV access was established.  I decided given his appropriate blood pressure to proceed with medical treatment rather than synchronize cardioversion.  I provided amiodarone 150 mg IV bolus over 10 minutes and  then we started the standard amiodarone 60 mg per hour infusion.  I gave him about 10 minutes after the first bolus and he was still tachycardic at about 170.  At that point I ordered a second 150 mg IV bolus.  His blood pressure had dropped slightly to the upper 93T systolic but he still felt about the same and was still alert and oriented.  At the very end of the second bolus he spontaneously converted to sinus rhythm.  See EKGs above for details.  He reported immediate resolution of the sense of palpitations and he said that the pressure on his chest almost completely went away.  In between the boluses I spoke by phone with his cardiologist, Dr. Neoma Laming, who approved of my plan and recommended that if the tachycardia did not resolve after the second bolus that we go ahead and performs a nice cardioversion before admitting the patient upstairs.  He also recommended magnesium 1 g IV which I also ordered and administered.  The patient is stable and in sinus rhythm with a rate in the 80s.  I discussed the case by phone with Dr. Darvin Neighbours with the hospitalist service who will admit.  Initial troponin is negative and chest x-ray is unremarkable.  CBC and metabolic panel are within normal limits except for a slightly high calcium.  Magnesium level is still pending.        ____________________________________________  FINAL CLINICAL IMPRESSION(S) / ED DIAGNOSES  Final diagnoses:  Ventricular tachycardia (HCC)  Chest pain, unspecified type     MEDICATIONS GIVEN DURING THIS VISIT:  Medications  amiodarone (NEXTERONE PREMIX) 360-4.14 MG/200ML-% (1.8 mg/mL) IV infusion (60 mg/hr Intravenous New Bag/Given 08/06/17 1706)  amiodarone (NEXTERONE PREMIX) 360-4.14 MG/200ML-% (1.8 mg/mL) IV infusion (not administered)  magnesium sulfate IVPB 1 g 100 mL (1 g Intravenous New Bag/Given 08/06/17 1735)  amiodarone (NEXTERONE) IV bolus only 150 mg/100 mL (0 mg Intravenous Stopped 08/06/17 1653)  amiodarone (NEXTERONE PREMIX) 360-4.14 MG/200ML-% (1.8 mg/mL) IV infusion (  Stopped 08/06/17 1644)  amiodarone (NEXTERONE) IV bolus only 150 mg/100 mL (0 mg Intravenous Stopped 08/06/17 1736)     NEW OUTPATIENT MEDICATIONS STARTED DURING THIS VISIT:  New Prescriptions   No medications on file    Modified Medications   No medications on file    Discontinued Medications   No medications on file     Note:  This document was prepared using Dragon voice recognition software and may include unintentional dictation errors.    Hinda Kehr, MD 08/06/17 [phone]

## 2017-08-06 NOTE — ED Notes (Signed)
nsr on monitor.  Pt alert.  Family with pt.  meds infusing.

## 2017-08-06 NOTE — ED Notes (Signed)
Iv amiodarone started.  md at bedside with pt and family  Pt alert and talking  heartrate 181.

## 2017-08-06 NOTE — ED Notes (Signed)
primedoc in with pt 

## 2017-08-06 NOTE — ED Notes (Signed)
Pt resting quietly.  nsr on monitor with occ pvc .  Pt waiting on admission.

## 2017-08-06 NOTE — ED Notes (Addendum)
Pt was push mowing and became diaphoretic and dizzy.  Pt with rapid heart rate .   Pt has pressure/pain/tightness is 6   2 iv's started and md at bedside.  Labs sent  Pt placed on 2liters oxygen.    No n/v/d.  Pt alert and talking   Family with pt.

## 2017-08-06 NOTE — ED Notes (Signed)
Pt in nsr with occ pvc.  Pt alert and talking  md at bedside.  repeatk ekg done.  Dr Karma Greaser with pt.

## 2017-08-06 NOTE — ED Notes (Signed)
Report off to allison rn  

## 2017-08-06 NOTE — Progress Notes (Signed)
eLink Physician-Brief Progress Note Patient Name: Shawn Shepard DOB: May 06, 1946 MRN: [id number]   Date of Service  08/06/2017  HPI/Events of Note  71 yo male with PMH of CAD, HTN and AFIB. Presents with VT with ventricular rate = 170's, Amiodarone IV infusion and bolus X 2. Being admitted to [facility] bed. HR now = 70. PCCM asked to assume care. VSS.   eICU Interventions  No new orders.      Intervention Category Evaluation Type: New Patient Evaluation  Lysle Dingwall 08/06/2017, 9:47 PM

## 2017-08-06 NOTE — ED Notes (Signed)
Pt family left to get dinner. Pt waiting for bedside echo. Pt denies CP at this time. Remains NSR on monitor with occasional PVC.

## 2017-08-06 NOTE — ED Notes (Signed)
Amiodarone drip infusing.  Pt alert and talking.  Heart rate 173.  Skin warm and dry.

## 2017-08-06 NOTE — ED Triage Notes (Signed)
States chest pain that began today while mowing the grass, pt diaphoretic upon arrival, states feeling "swimmy headed"

## 2017-08-06 NOTE — H&P (Addendum)
[facility] [place] NAME: Shawn Shepard    MR#:  [id number]  DATE OF BIRTH:  12/26/1945  DATE OF ADMISSION:  08/06/2017  PRIMARY CARE PHYSICIAN: [name], MD   REQUESTING/REFERRING PHYSICIAN: Dr. Karma Greaser  CHIEF COMPLAINT:   Chief Complaint  Patient presents with  . Chest Pain    HISTORY OF PRESENT ILLNESS:  Shawn Shepard  is a 71 y.o. male with a known history of CAD, hypertension, atrial fibrillation presents to the emergency room complaining of acute onset of palpitations, nausea, dizziness, chest pain and shortness of breath while mowing his lawn. He called EMS and was found to be sustained ventricular tachycardia. He had 2 boluses of amiodarone and then amiodarone infusion started. Converted normal sinus rhythm. Had a total of 2 hours of ventricular tachycardia. Repeat EKG once normal sinus rhythm returned shows no significant ST depression or elevation. Case discussed with Dr. Humphrey Rolls his cardiologist and advised admission. Presently patient feels close to normal. Does seem to have significant fatigue.  He had brief episode of chest pain yesterday which resolved on its own.  Cardiac catheterization 2017 showed  Mid LAD lesion, 40% stenosed.  Mid Cx to Dist Cx lesion, 30% stenosed.  Mid RCA lesion, 30% stenosed. No obstructive CAD and normal EF  PAST MEDICAL HISTORY:   Past Medical History:  Diagnosis Date  . Basal cell carcinoma   . CAD (coronary artery disease)   . Hyperlipidemia   . Hypertension   . Shingles     PAST SURGICAL HISTORY:   Past Surgical History:  Procedure Laterality Date  . CARDIAC CATHETERIZATION N/A 01/16/2016   Procedure: Left Heart Cath and Coronary Angiography;  Surgeon: [name], MD;  Location: [facility];  Service: Cardiovascular;  Laterality: N/A;  . CORONARY ANGIOPLASTY  2000 and 2003  . GANGLION CYST EXCISION  1979  . PILONIDAL CYST EXCISION  1977  . SKIN CANCER  EXCISION  1999   ear  . VEIN SURGERY  2000   laser    SOCIAL HISTORY:   Social History  Substance Use Topics  . Smoking status: Former Smoker    Types: Cigarettes    Quit date: 06/21/1971  . Smokeless tobacco: Former Systems developer    Types: Chew    Quit date: 06/20/1980  . Alcohol use Yes     Comment: 1-2 a week    FAMILY HISTORY:   Family History  Problem Relation Age of Onset  . Hypertension Mother   . Heart disease Mother   . Heart attack Mother   . Diabetes Mother   . Alzheimer's disease Father   . Congestive Heart Failure Brother     DRUG ALLERGIES:  No Known Allergies  REVIEW OF SYSTEMS:   Review of Systems  Constitutional: Positive for malaise/fatigue. Negative for chills and fever.  HENT: Negative for sore throat.   Eyes: Negative for blurred vision, double vision and pain.  Respiratory: Positive for shortness of breath. Negative for cough, hemoptysis and wheezing.   Cardiovascular: Positive for chest pain and palpitations. Negative for orthopnea and leg swelling.  Gastrointestinal: Positive for nausea. Negative for abdominal pain, constipation, diarrhea, heartburn and vomiting.  Genitourinary: Negative for dysuria and hematuria.  Musculoskeletal: Negative for back pain and joint pain.  Skin: Negative for rash.  Neurological: Positive for weakness. Negative for sensory change, speech change, focal weakness and headaches.  Endo/Heme/Allergies: Does not bruise/bleed easily.  Psychiatric/Behavioral: Negative for depression. The patient is  not nervous/anxious.     MEDICATIONS AT HOME:   Prior to Admission medications   Medication Sig Start Date End Date Taking? Authorizing Provider  aspirin EC 81 MG tablet Take 81 mg by mouth daily.   Yes [provider]  atorvastatin (LIPITOR) 80 MG tablet Take 1 tablet (80 mg total) by mouth at bedtime. 09/10/16  Yes Crissman, Jeannette How, MD  clopidogrel (PLAVIX) 75 MG tablet Take 1 tablet (75 mg total) by mouth daily.  09/10/16  Yes Crissman, Jeannette How, MD  fexofenadine (ALLEGRA) 180 MG tablet Take 1 tablet (180 mg total) by mouth daily. 03/04/17  Yes Crissman, Jeannette How, MD  meloxicam (MOBIC) 15 MG tablet Take 1 tablet (15 mg total) by mouth daily. 06/23/17  Yes Crissman, Jeannette How, MD  metoprolol succinate (TOPROL-XL) 50 MG 24 hr tablet Take 1 tablet (50 mg total) by mouth daily. Take with or immediately following a meal. 09/10/16  Yes Crissman, Jeannette How, MD  niacin (NIASPAN) 1000 MG CR tablet Take 1 tablet (1,000 mg total) by mouth at bedtime. 09/10/16  Yes [name], MD  pantoprazole (PROTONIX) 40 MG tablet take 1 tablet by mouth once daily 07/07/17  Yes Crissman, Jeannette How, MD  ramipril (ALTACE) 5 MG capsule take 1 capsule by mouth once daily 08/19/16  Yes Crissman, Jeannette How, MD  alendronate (FOSAMAX) 35 MG tablet Take 1 tablet (35 mg total) by mouth every 7 (seven) days. Take with a full glass of water on an empty stomach. Patient not taking: Reported on 08/06/2017 05/27/17   [name], MD  hydrocortisone (ANUSOL-HC) 2.5 % rectal cream Place 1 application rectally 2 (two) times daily. 03/04/17   [name], MD     VITAL SIGNS:  Blood pressure 115/85, pulse 73, temperature 98 F (36.7 C), temperature source Oral, resp. rate 15, height 6\' 2"  (1.88 m), weight 93 kg (205 lb), SpO2 100 %.  PHYSICAL EXAMINATION:  Physical Exam  GENERAL:  71 y.o.-year-old patient lying in the bed with no acute distress.  EYES: Pupils equal, round, reactive to light and accommodation. No scleral icterus. Extraocular muscles intact.  HEENT: Head atraumatic, normocephalic. Oropharynx and nasopharynx clear. No oropharyngeal erythema, moist oral mucosa  NECK:  Supple, no jugular venous distention. No thyroid enlargement, no tenderness.  LUNGS: Normal breath sounds bilaterally, no wheezing, rales, rhonchi. No use of accessory muscles of respiration.  CARDIOVASCULAR: S1, S2 normal. No murmurs, rubs, or gallops.  ABDOMEN: Soft, nontender,  nondistended. Bowel sounds present. No organomegaly or mass.  EXTREMITIES: No pedal edema, cyanosis, or clubbing. + 2 pedal & radial pulses b/l.   NEUROLOGIC: Cranial nerves II through XII are intact. No focal Motor or sensory deficits appreciated b/l PSYCHIATRIC: The patient is alert and oriented x 3. Good affect.  SKIN: No obvious rash, lesion, or ulcer.   LABORATORY PANEL:   CBC  Recent Labs Lab 08/06/17 1625  WBC 4.6  HGB 14.0  HCT 40.9  PLT 188   ------------------------------------------------------------------------------------------------------------------  Chemistries   Recent Labs Lab 08/06/17 1625  NA 137  K 4.2  CL 105  CO2 24  GLUCOSE 97  BUN 13  CREATININE 0.98  CALCIUM 10.6*   ------------------------------------------------------------------------------------------------------------------  Cardiac Enzymes  Recent Labs Lab 08/06/17 1625  TROPONINI <0.03   ------------------------------------------------------------------------------------------------------------------  RADIOLOGY:  Dg Chest Port 1 View  Result Date: 08/06/2017 CLINICAL DATA:  Generalized chest pain that began today while doing yard work. EXAM: PORTABLE CHEST 1 VIEW COMPARISON:  None. FINDINGS: Heart  size is normal. Slight tortuosity of the aorta. The pulmonary vascularity is normal. Lungs are clear. No abnormal bone finding. Artifact overlies the chest. IMPRESSION: No active disease evident. Electronically Signed   By: Nelson Chimes M.D.   On: 08/06/2017 16:54     IMPRESSION AND PLAN:   * Sustained V tach Patient presently in normal sinus rhythm after 2 amiodarone boluses and now on the amiodarone drip. Troponin normal. EKG shows no ST elevation. Case discussed with Dr. Humphrey Rolls of cardiology. Patient will be admitted to stepdown unit. Repeat troponin. Check echocardiogram. Further management as per cardiology input and echo and troponin results.  Consult intensivist while patient  is in stepdown area. Call the link. Waiting for call back from Dr. Madalyn Rob.  * Hypertension. We'll continue home medications of metoprolol and Altace.  * CAD. Stable at this time. Continue Plavix and aspirin.  * DVT prophylaxis with Lovenox  All the records are reviewed and case discussed with ED provider. Management plans discussed with the patient, family and they are in agreement.  CODE STATUS: FULL CODE  TOTAL TIME TAKING CARE OF THIS PATIENT: 40 minutes.   Hillary Bow R M.D on 08/06/2017 at 6:50 PM  Between 7am to 6pm - Pager - [phone]  After 6pm go to [email or web address] - password [email or web address] [facility]  Office  [phone]  CC: Primary care physician; [name], MD  Note: This dictation was prepared with Dragon dictation along with smaller phrase technology. Any transcriptional errors that result from this process are unintentional.

## 2017-08-07 ENCOUNTER — Encounter
Admission: EM | Disposition: A | Discharge status: Home / Self Care | Payer: Self-pay | Source: Home / Self Care | Attending: Internal Medicine

## 2017-08-07 ENCOUNTER — Inpatient Hospital Stay
Admit: 2017-08-07 | Discharge: 2017-08-07 | Disposition: A | Discharge status: Home / Self Care | Payer: Medicare Other | Attending: Internal Medicine | Admitting: Internal Medicine

## 2017-08-07 DIAGNOSIS — I429 Cardiomyopathy, unspecified: Secondary | ICD-10-CM

## 2017-08-07 HISTORY — PX: LEFT HEART CATH: CATH118248

## 2017-08-07 LAB — CBC
HCT: 39.2 % — ABNORMAL LOW (ref 40.0–52.0)
Hemoglobin: 13.4 g/dL (ref 13.0–18.0)
MCH: 31.2 pg (ref 26.0–34.0)
MCHC: 34.2 g/dL (ref 32.0–36.0)
MCV: 91.4 fL (ref 80.0–100.0)
PLATELETS: 162 10*3/uL (ref 150–440)
RBC: 4.29 MIL/uL — AB (ref 4.40–5.90)
RDW: 13.9 % (ref 11.5–14.5)
WBC: 3.6 10*3/uL — AB (ref 3.8–10.6)

## 2017-08-07 LAB — BASIC METABOLIC PANEL
Anion gap: 3 — ABNORMAL LOW (ref 5–15)
BUN: 12 mg/dL (ref 6–20)
CHLORIDE: 109 mmol/L (ref 101–111)
CO2: 28 mmol/L (ref 22–32)
CREATININE: 0.83 mg/dL (ref 0.61–1.24)
Calcium: 9.7 mg/dL (ref 8.9–10.3)
GFR calc non Af Amer: >60 mL/min (ref >60)
Glucose, Bld: 106 mg/dL — ABNORMAL HIGH (ref 65–99)
Potassium: 3.8 mmol/L (ref 3.5–5.1)
SODIUM: 140 mmol/L (ref 135–145)

## 2017-08-07 LAB — GLUCOSE, CAPILLARY: Glucose-Capillary: 93 mg/dL (ref 65–99)

## 2017-08-07 LAB — ECHOCARDIOGRAM COMPLETE
Height: 74 in
Weight: 3301.61 oz

## 2017-08-07 LAB — TROPONIN I: Troponin I: 0.07 ng/mL (ref <0.03)

## 2017-08-07 SURGERY — LEFT HEART CATH
Anesthesia: Moderate Sedation | Laterality: Right

## 2017-08-07 MED ORDER — SODIUM CHLORIDE 0.9 % WEIGHT BASED INFUSION: 1.0000 mL/kg/h | INTRAVENOUS | Status: DC

## 2017-08-07 MED ORDER — ONDANSETRON HCL 4 MG/2ML IJ SOLN: 4.0000 mg | Freq: Four times a day (QID) | INTRAMUSCULAR | Status: DC | PRN

## 2017-08-07 MED ORDER — ASPIRIN 81 MG PO CHEW: 81.0000 mg | CHEWABLE_TABLET | ORAL | Status: DC

## 2017-08-07 MED ORDER — SODIUM CHLORIDE 0.9 % WEIGHT BASED INFUSION: 1.0000 mL/kg/h | INTRAVENOUS | Status: AC

## 2017-08-07 MED ORDER — MIDAZOLAM HCL 2 MG/2ML IJ SOLN
INTRAMUSCULAR | Status: DC | PRN
  Administered 2017-08-07: 1 mg via INTRAVENOUS

## 2017-08-07 MED ORDER — FENTANYL CITRATE (PF) 100 MCG/2ML IJ SOLN
INTRAMUSCULAR | Status: DC | PRN
  Administered 2017-08-07: 25 ug via INTRAVENOUS

## 2017-08-07 MED ORDER — SODIUM CHLORIDE 0.9 % WEIGHT BASED INFUSION
3.0000 mL/kg/h | INTRAVENOUS | Status: DC
  Administered 2017-08-07: 3 mL/kg/h via INTRAVENOUS

## 2017-08-07 MED ORDER — SODIUM CHLORIDE 0.9 % IV SOLN: 250.0000 mL | INTRAVENOUS | Status: DC | PRN

## 2017-08-07 MED ORDER — MAGNESIUM SULFATE 2 GM/50ML IV SOLN
2.0000 g | Freq: Once | INTRAVENOUS | Status: AC
  Administered 2017-08-07: 2 g via INTRAVENOUS
  Filled 2017-08-07: qty 50

## 2017-08-07 MED ORDER — FENTANYL CITRATE (PF) 100 MCG/2ML IJ SOLN
INTRAMUSCULAR | Status: AC
  Filled 2017-08-07: qty 2

## 2017-08-07 MED ORDER — SODIUM CHLORIDE 0.9% FLUSH
3.0000 mL | Freq: Two times a day (BID) | INTRAVENOUS | Status: DC
  Administered 2017-08-07: 3 mL via INTRAVENOUS

## 2017-08-07 MED ORDER — ACETAMINOPHEN 325 MG PO TABS
ORAL_TABLET | ORAL | Status: AC
  Filled 2017-08-07: qty 2

## 2017-08-07 MED ORDER — SODIUM CHLORIDE 0.9% FLUSH
3.0000 mL | INTRAVENOUS | Status: DC | PRN
Start: 2017-08-07 — End: 2017-08-07

## 2017-08-07 MED ORDER — ACETAMINOPHEN 325 MG PO TABS
650.0000 mg | ORAL_TABLET | ORAL | Status: DC | PRN
  Administered 2017-08-08: 650 mg via ORAL
  Filled 2017-08-07: qty 2

## 2017-08-07 MED ORDER — SODIUM CHLORIDE 0.9% FLUSH
3.0000 mL | Freq: Two times a day (BID) | INTRAVENOUS | Status: DC
  Administered 2017-08-08 (×2): 3 mL via INTRAVENOUS

## 2017-08-07 MED ORDER — IOPAMIDOL (ISOVUE-300) INJECTION 61%
INTRAVENOUS | Status: DC | PRN
  Administered 2017-08-07: 110 mL via INTRA_ARTERIAL

## 2017-08-07 MED ORDER — HEPARIN (PORCINE) IN NACL 2-0.9 UNIT/ML-% IJ SOLN
INTRAMUSCULAR | Status: AC
  Filled 2017-08-07: qty 500

## 2017-08-07 MED ORDER — LIDOCAINE HCL (PF) 1 % IJ SOLN
INTRAMUSCULAR | Status: AC
  Filled 2017-08-07: qty 30

## 2017-08-07 MED ORDER — DILTIAZEM HCL ER COATED BEADS 120 MG PO CP24
120.0000 mg | ORAL_CAPSULE | Freq: Every day | ORAL | Status: DC
  Administered 2017-08-07 – 2017-08-08 (×2): 120 mg via ORAL
  Filled 2017-08-07 (×2): qty 1

## 2017-08-07 MED ORDER — SODIUM CHLORIDE 0.9% FLUSH: 3.0000 mL | INTRAVENOUS | Status: DC | PRN

## 2017-08-07 MED ORDER — MIDAZOLAM HCL 2 MG/2ML IJ SOLN
INTRAMUSCULAR | Status: AC
  Filled 2017-08-07: qty 2

## 2017-08-07 SURGICAL SUPPLY — 9 items
CATH INFINITI 5FR ANG PIGTAIL (CATHETERS) ×3 IMPLANT
CATH INFINITI 5FR JL4 (CATHETERS) ×3 IMPLANT
CATH INFINITI JR4 5F (CATHETERS) ×3 IMPLANT
DEVICE CLOSURE MYNXGRIP 5F (Vascular Products) ×3 IMPLANT
GUIDEWIRE 3MM J TIP .035 145 (WIRE) ×3 IMPLANT
KIT MANI 3VAL PERCEP (MISCELLANEOUS) ×3 IMPLANT
NEEDLE PERC 18GX7CM (NEEDLE) ×3 IMPLANT
PACK CARDIAC CATH (CUSTOM PROCEDURE TRAY) ×3 IMPLANT
SHEATH PINNACLE 5F 10CM (SHEATH) ×3 IMPLANT

## 2017-08-07 NOTE — Progress Notes (Signed)
[facility] at [place] NAME: Shawn Shepard    MR#:  [id number]  DATE OF BIRTH:  [birth year]  SUBJECTIVE:admitted for polymorphic V. Tach. On amiodarone drip,scheduled for cardiac catheter.no chest pain or shortness of breath. Patient was seen in cardiac Unit.  CHIEF COMPLAINT:   Chief Complaint  Patient presents with  . Chest Pain    REVIEW OF SYSTEMS:   ROS CONSTITUTIONAL: No fever, fatigue or weakness.  EYES: No blurred or double vision.  EARS, NOSE, AND THROAT: No tinnitus or ear pain.  RESPIRATORY: No cough, shortness of breath, wheezing or hemoptysis.  CARDIOVASCULAR: No chest pain, orthopnea, edema.  GASTROINTESTINAL: No nausea, vomiting, diarrhea or abdominal pain.  GENITOURINARY: No dysuria, hematuria.  ENDOCRINE: No polyuria, nocturia,  HEMATOLOGY: No anemia, easy bruising or bleeding SKIN: No rash or lesion. MUSCULOSKELETAL: No joint pain or arthritis.   NEUROLOGIC: No tingling, numbness, weakness.  PSYCHIATRY: No anxiety or depression.   DRUG ALLERGIES:  No Known Allergies  VITALS:  Blood pressure (!) 145/76, pulse (!) 59, temperature 97.9 F (36.6 C), temperature source Oral, resp. rate 19, height 6\' 2"  (1.88 m), weight 93.4 kg (206 lb), SpO2 98 %.  PHYSICAL EXAMINATION:  GENERAL:  71 y.o.-year-old patient lying in the bed with no acute distress.  EYES: Pupils equal, round, reactive to light and accommodation. No scleral icterus. Extraocular muscles intact.  HEENT: Head atraumatic, normocephalic. Oropharynx and nasopharynx clear.  NECK:  Supple, no jugular venous distention. No thyroid enlargement, no tenderness.  LUNGS: Normal breath sounds bilaterally, no wheezing, rales,rhonchi or crepitation. No use of accessory muscles of respiration.  CARDIOVASCULAR: S1, S2 normal. No murmurs, rubs, or gallops.  ABDOMEN: Soft, nontender, nondistended. Bowel sounds present. No organomegaly or mass.  EXTREMITIES: No pedal  edema, cyanosis, or clubbing.  NEUROLOGIC: Cranial nerves II through XII are intact. Muscle strength 5/5 in all extremities. Sensation intact. Gait not checked.  PSYCHIATRIC: The patient is alert and oriented x 3.  SKIN: No obvious rash, lesion, or ulcer.    LABORATORY PANEL:   CBC  Recent Labs Lab 08/07/17 0202  WBC 3.6*  HGB 13.4  HCT 39.2*  PLT 162   ------------------------------------------------------------------------------------------------------------------  Chemistries   Recent Labs Lab 08/06/17 1624  08/07/17 0202  NA  --   < > 140  K  --   < > 3.8  CL  --   < > 109  CO2  --   < > 28  GLUCOSE  --   < > 106*  BUN  --   < > 12  CREATININE  --   < > 0.83  CALCIUM  --   < > 9.7  MG 1.6*  --   --   AST 26  --   --   ALT 19  --   --   ALKPHOS 59  --   --   BILITOT 0.5  --   --   < > = values in this interval not displayed. ------------------------------------------------------------------------------------------------------------------  Cardiac Enzymes  Recent Labs Lab 08/07/17 0202  TROPONINI 0.07*   ------------------------------------------------------------------------------------------------------------------  RADIOLOGY:  Dg Chest Port 1 View  Result Date: 08/06/2017 CLINICAL DATA:  Generalized chest pain that began today while doing yard work. EXAM: PORTABLE CHEST 1 VIEW COMPARISON:  None. FINDINGS: Heart size is normal. Slight tortuosity of the aorta. The pulmonary vascularity is normal. Lungs are clear. No abnormal bone finding. Artifact overlies the chest. IMPRESSION: No active disease evident. Electronically Signed  By: Nelson Chimes M.D.   On: 08/06/2017 16:54    EKG:   Orders placed or performed during the hospital encounter of 08/06/17  . ED EKG within 10 minutes  . ED EKG within 10 minutes  . EKG 12-Lead  . EKG 12-Lead  . EKG 12-Lead  . EKG 12-Lead  . EKG 12-Lead  . EKG 12-Lead    ASSESSMENT AND PLAN:   #31/71 year old male  patient with persistent V. Tach for 2 hours, admitted to ICU, started on amiodarone drip. Scheduled for cardiac catheter. Troponin's are negative, EKG did not show any ST-T changes. Echocardiogram showed EF 45% with hypokinesia of anteroseptal myocardium, regional wall motion and normal 80s. Let's follow the cardiac catheter results.  #2 CAD;cardiac catheter last year showed minimal obstruction. On aspirin, statins, Plavix, beta blockers. #3 .hyperlipidemia  Discussed with patient. All the records are reviewed and case discussed with Care Management/Social Workerr. Management plans discussed with the patient, family and they are in agreement.  CODE STATUS: full  TOTAL TIME TAKING CARE OF THIS PATIENT: 35 minutes.   POSSIBLE D/C IN 1-2DAYS, DEPENDING ON CLINICAL CONDITION.   [name] M.D on 08/07/2017 at 1:51 PM  Between 7am to 6pm - Pager - [phone]  After 6pm go to [email or web address] - password [email or web address] [facility]  Office  [phone]  CC: Primary care physician; [name], MD   Note: This dictation was prepared with Dragon dictation along with smaller phrase technology. Any transcriptional errors that result from this process are unintentional.

## 2017-08-07 NOTE — Consult Note (Signed)
ELECTROPHYSIOLOGY CONSULT NOTE  Patient ID: Shawn Shepard, MRN: [id number], DOB/AGE: [birth year] 71 y.o. Admit date: 08/06/2017 Date of Consult: 08/07/2017  Primary Physician: [name], MD Primary Cardiologist: Toshio Slusher is a 71 y.o. male who is being seen today for the evaluation of VT at the request of [facility].    HPI Shawn Shepard is a 71 y.o. 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 He has no antecedent history of syncope or tachycardia palpitations.  Yesterday while mowing the lawn he began to have tachypalpitations associated with some weakness and fatigue. He had mild lightheadedness. The symptoms persisted over about 1-2 hours. He took a shower and finally presented himself to the emergency room where he was found to be in ventricular tachycardia with a left bundle inferior axis morphology. He was given amiodarone with termination.  There is no family history of tachycardia. There is no family history of premature death.  Cardiac evaluation in the past has demonstrated only nonobstructive coronary disease with an ejection fraction of 30-40% most recently in 2017  Echocardiogram today demonstrating ejection fraction 45% with moderate LVH Catheterization confirmed obstructive coronary disease and mild left ventricular dysfunction     Past Medical History:  Diagnosis Date  . Basal cell carcinoma   . CAD (coronary artery disease)   . Hyperlipidemia   . Hypertension   . Shingles       Surgical History:  Past Surgical History:  Procedure Laterality Date  . CARDIAC CATHETERIZATION N/A 01/16/2016   Procedure: Left Heart Cath and Coronary Angiography;  Surgeon: [name], MD;  Location: [facility];  Service: Cardiovascular;  Laterality: N/A;  . CORONARY ANGIOPLASTY  2000 and 2003  . GANGLION CYST EXCISION  1979  . PILONIDAL  CYST EXCISION  1977  . SKIN CANCER EXCISION  1999   ear  . VEIN SURGERY  2000   laser     Home Meds: Prior to Admission medications   Medication Sig Start Date End Date Taking? Authorizing Provider  aspirin EC 81 MG tablet Take 81 mg by mouth daily.   Yes [provider]  atorvastatin (LIPITOR) 80 MG tablet Take 1 tablet (80 mg total) by mouth at bedtime. 09/10/16  Yes Crissman, Jeannette How, MD  clopidogrel (PLAVIX) 75 MG tablet Take 1 tablet (75 mg total) by mouth daily. 09/10/16  Yes Crissman, Jeannette How, MD  fexofenadine (ALLEGRA) 180 MG tablet Take 1 tablet (180 mg total) by mouth daily. 03/04/17  Yes Crissman, Jeannette How, MD  meloxicam (MOBIC) 15 MG tablet Take 1 tablet (15 mg total) by mouth daily. 06/23/17  Yes Crissman, Jeannette How, MD  metoprolol succinate (TOPROL-XL) 50 MG 24 hr tablet Take 1 tablet (50 mg total) by mouth daily. Take with or immediately following a meal. 09/10/16  Yes Crissman, Jeannette How, MD  niacin (NIASPAN) 1000 MG CR tablet Take 1 tablet (1,000 mg total) by mouth at bedtime. 09/10/16  Yes [name], MD  pantoprazole (PROTONIX) 40 MG tablet take 1 tablet by mouth once daily 07/07/17  Yes Crissman, Jeannette How, MD  ramipril (ALTACE) 5 MG capsule take 1 capsule by mouth once daily 08/19/16  Yes Crissman, Jeannette How, MD  alendronate (FOSAMAX) 35 MG tablet Take 1 tablet (35 mg total) by mouth every 7 (seven) days. Take with a full  glass of water on an empty stomach. Patient not taking: Reported on 08/06/2017 05/27/17   [name], MD  hydrocortisone (ANUSOL-HC) 2.5 % rectal cream Place 1 application rectally 2 (two) times daily. 03/04/17   [name], MD    Allergies: No Known Allergies  Social History   Social History  . Marital status: Married    Spouse name: N/A  . Number of children: 2  . Years of education: N/A   Occupational History  . Not on file.   Social History Main Topics  . Smoking status: Former Smoker    Types: Cigarettes    Quit date: 06/21/1971  .  Smokeless tobacco: Former Systems developer    Types: Chew    Quit date: 06/20/1980  . Alcohol use Yes     Comment: 1-2 a week  . Drug use: No  . Sexual activity: Not on file   Other Topics Concern  . Not on file   Social History Narrative  . No narrative on file     Family History  Problem Relation Age of Onset  . Hypertension Mother   . Heart disease Mother   . Heart attack Mother   . Diabetes Mother   . Alzheimer's disease Father   . Congestive Heart Failure Brother      ROS:  Please see the history of present illness.     All other systems reviewed and negative.    Physical Exam: Blood pressure (!) 141/101, pulse (!) 58, temperature 97.9 F (36.6 C), temperature source Oral, resp. rate 16, height 6\' 2"  (1.88 m), weight 206 lb (93.4 kg), SpO2 100 %. General: Well developed, well nourished male in no acute distress. Head: Normocephalic, atraumatic, sclera non-icteric, no xanthomas, nares are without discharge. EENT: normal  Lymph Nodes:  none Neck: Negative for carotid bruits. JVD not elevated. Back:without scoliosis kyphosis  Lungs: Clear bilaterally to auscultation without wheezes, rales, or rhonchi. Breathing is unlabored. Heart: RRR with S1 S2. No   murmur . No rubs, or gallops appreciated. Abdomen: Soft, non-tender, non-distended with normoactive bowel sounds. No hepatomegaly. No rebound/guarding. No obvious abdominal masses. Msk:  Strength and tone appear normal for age. Extremities: No clubbing or cyanosis. No  edema.  Distal pedal pulses are 2+ and equal bilaterally. Skin: Warm and Dry Neuro: Alert and oriented X 3. CN III-XII intact Grossly normal sensory and motor function . Psych:  Responds to questions appropriately with a normal affect.      Labs: Cardiac Enzymes  Recent Labs  08/06/17 1625 08/06/17 2028 08/07/17 0202  TROPONINI <0.03 0.05* 0.07*   CBC Lab Results  Component Value Date   WBC 3.6 (L) 08/07/2017   HGB 13.4 08/07/2017   HCT 39.2 (L)  08/07/2017   MCV 91.4 08/07/2017   PLT 162 08/07/2017   PROTIME:  Recent Labs  08/06/17 1624  LABPROT 12.8  INR 0.97   Chemistry  Recent Labs Lab 08/06/17 1624  08/07/17 0202  NA  --   < > 140  K  --   < > 3.8  CL  --   < > 109  CO2  --   < > 28  BUN  --   < > 12  CREATININE  --   < > 0.83  CALCIUM  --   < > 9.7  PROT 6.6  --   --   BILITOT 0.5  --   --   ALKPHOS 59  --   --   ALT  19  --   --   AST 26  --   --   GLUCOSE  --   < > 106*  < > = values in this interval not displayed. Lipids Lab Results  Component Value Date   CHOL 128 03/04/2017   HDL 46 03/04/2017   LDLCALC 59 03/04/2017   TRIG 116 03/04/2017   BNP No results found for: PROBNP Thyroid Function Tests: No results for input(s): TSH, T4TOTAL, T3FREE, THYROIDAB in the last 72 hours.  Invalid input(s): FREET3 Miscellaneous No results found for: DDIMER  Radiology/Studies:  Dg Chest Port 1 View  Result Date: 08/06/2017 CLINICAL DATA:  Generalized chest pain that began today while doing yard work. EXAM: PORTABLE CHEST 1 VIEW COMPARISON:  None. FINDINGS: Heart size is normal. Slight tortuosity of the aorta. The pulmonary vascularity is normal. Lungs are clear. No abnormal bone finding. Artifact overlies the chest. IMPRESSION: No active disease evident. Electronically Signed   By: Nelson Chimes M.D.   On: 08/06/2017 16:54    EKG: 08/07/1999 1816:22 ventricular tachycardia left bundle inferior axis cycle length 320 ms QR lead aVR and R wave in lead aVL and a transition in lead 2 >>5 September 16: 29 sinus rhythm at 80 Intervals 14/10/37 with a qRSR prime in lead V1 and PVCs with a left bundle inferior axis morphology 2 distinct morphologies noted >>1/17 sinus rhythm at 70 Intervals 19/10/40 PVCs (bundle relatively narrow at 160 ms similar to the sustained VT >>August 2007 sinus rhythm 66 Intervals 21/10/41 Again qRSR prime in lead V1 and a left bundle PVC with a transition in lead V3 Assessment and  Plan:   Ventricular tachycardia  VEA frequent  Cardiomyopathy   The patient has a mild nonischemic cardiomyopathy in the context of frequent ventricular ectopy and sustained ventricular tachycardia. The morphology of the ventricular tachycardia suggests an origin in the outflow tract. It is not quite typical given the fact that there is an R wave in lead aVL. Typically these patients have a QS in lead aVL. Still, the appearance strongly supports and outflow tract origin  The cardiomyopathy may or may not be related to the ventricular ectopy; I think it is unlikely to be responsible for the ventricular tachycardia although this is possible. I would undertake an approach to eliminate the ventricular ectopy using either calcium blockers (beta blockers having been on board when he had this episode by his report) And if not using a 1C agent based on ar recent report from [place] about its relative safety not withstanding the cardiomyopathy if the presumption is that the ventricular ectopy might be the cause of the cardiomyopathy.  I am not aware of the indication for Plavix.  In [place] sustained ventricular tachycardia is associated with a driving restriction   I would begin diltiazem 120 mg daily.  I would continue his ACE inhibitor.  I would anticipate a Holter monitor in 2 weeks to assess ventricular ectopic burden. At that juncture would make a decision regarding the use of a 1C agent.  I'm glad to see in follow-up at Dr. Laurelyn Sickle request       Virl Axe

## 2017-08-07 NOTE — Progress Notes (Signed)
[place] for electrolyte management   No Known Allergies  Patient Measurements: Height: 6\' 2"  (188 cm) Weight: 206 lb (93.4 kg) IBW/kg (Calculated) : 82.2  Vital Signs: Temp: 97.9 F (36.6 C) (09/06 0836) Temp Source: Oral (09/06 1307) BP: 141/101 (09/06 1600) Pulse Rate: 58 (09/06 1600) Intake/Output from previous day: 09/05 0701 - 09/06 0700 In: 66.6 [I.V.:66.6] Out: 1000 [Urine:1000] Intake/Output from this shift: Total I/O In: -  Out: 650 [Urine:650]  Labs:  Recent Labs  08/06/17 1624 08/06/17 1625 08/07/17 0202  WBC  --  4.6 3.6*  HGB  --  14.0 13.4  HCT  --  40.9 39.2*  PLT  --  188 162  APTT 31  --   --   CREATININE  --  0.98 0.83  MG 1.6*  --   --   ALBUMIN 3.9  --   --   PROT 6.6  --   --   AST 26  --   --   ALT 19  --   --   ALKPHOS 59  --   --   BILITOT 0.5  --   --   BILIDIR 0.1  --   --   IBILI 0.4  --   --    Estimated Creatinine Clearance: 97.7 mL/min (by C-G formula based on SCr of 0.83 mg/dL).   Medical History: Past Medical History:  Diagnosis Date  . Basal cell carcinoma   . CAD (coronary artery disease)   . Hyperlipidemia   . Hypertension   . Shingles     Pharmacy consulted for electrolyte management for 71 yo male admitted with sustained vtach.   Plan:  Patient received magnesium 2g IV x 1 prior to going to cath lab for goal magnesium > 2.   Will recheck electrolytes with am labs.   Lexxus Underhill L 08/07/2017,4:15 PM

## 2017-08-07 NOTE — Consult Note (Signed)
Shawn Shepard is a 71 y.o. male  [id number]  Primary Cardiologist: [place] Reason for Consultation: Shawn Shepard  HPI: 79 YOWM presented with 3-5 hours of chest pain and was in sustained polymorphic V. Tach. Gave amiodrone bolus twice and mg 1 gram abnd converted to NSR.  Review of Systems: No orthopea/PND   Past Medical History:  Diagnosis Date  . Basal cell carcinoma   . CAD (coronary artery disease)   . Hyperlipidemia   . Hypertension   . Shingles     Medications Prior to Admission  Medication Sig Dispense Refill  . aspirin EC 81 MG tablet Take 81 mg by mouth daily.    Marland Kitchen atorvastatin (LIPITOR) 80 MG tablet Take 1 tablet (80 mg total) by mouth at bedtime. 90 tablet 4  . clopidogrel (PLAVIX) 75 MG tablet Take 1 tablet (75 mg total) by mouth daily. 90 tablet 4  . fexofenadine (ALLEGRA) 180 MG tablet Take 1 tablet (180 mg total) by mouth daily. 30 tablet 12  . meloxicam (MOBIC) 15 MG tablet Take 1 tablet (15 mg total) by mouth daily. 30 tablet 3  . metoprolol succinate (TOPROL-XL) 50 MG 24 hr tablet Take 1 tablet (50 mg total) by mouth daily. Take with or immediately following a meal. 90 tablet 4  . niacin (NIASPAN) 1000 MG CR tablet Take 1 tablet (1,000 mg total) by mouth at bedtime. 90 tablet 4  . pantoprazole (PROTONIX) 40 MG tablet take 1 tablet by mouth once daily 30 tablet 3  . ramipril (ALTACE) 5 MG capsule take 1 capsule by mouth once daily 90 capsule 4  . alendronate (FOSAMAX) 35 MG tablet Take 1 tablet (35 mg total) by mouth every 7 (seven) days. Take with a full glass of water on an empty stomach. (Patient not taking: Reported on 08/06/2017) 4 tablet 12  . hydrocortisone (ANUSOL-HC) 2.5 % rectal cream Place 1 application rectally 2 (two) times daily. 30 g 0     . [START ON 08/08/2017] aspirin  81 mg Oral Pre-Cath  . aspirin EC  81 mg Oral Daily  . atorvastatin  80 mg Oral QHS  . clopidogrel  75 mg Oral Daily  . enoxaparin (LOVENOX) injection  40 mg Subcutaneous Q24H   . hydrocortisone  1 application Rectal BID  . meloxicam  15 mg Oral Daily  . metoprolol succinate  50 mg Oral Daily  . niacin  1,000 mg Oral QHS  . pantoprazole  40 mg Oral Daily  . ramipril  5 mg Oral Daily  . sodium chloride flush  3 mL Intravenous Q12H  . sodium chloride flush  3 mL Intravenous Q12H    Infusions: . sodium chloride    . [START ON 08/08/2017] sodium chloride     Followed by  . [START ON 08/08/2017] sodium chloride    . amiodarone 30 mg/hr (08/06/17 2317)    No Known Allergies  Social History   Social History  . Marital status: Married    Spouse name: N/A  . Number of children: 2  . Years of education: N/A   Occupational History  . Not on file.   Social History Main Topics  . Smoking status: Former Smoker    Types: Cigarettes    Quit date: 06/21/1971  . Smokeless tobacco: Former Systems developer    Types: Chew    Quit date: 06/20/1980  . Alcohol use Yes     Comment: 1-2 a week  . Drug use: No  . Sexual activity:  Not on file   Other Topics Concern  . Not on file   Social History Narrative  . No narrative on file    Family History  Problem Relation Age of Onset  . Hypertension Mother   . Heart disease Mother   . Heart attack Mother   . Diabetes Mother   . Alzheimer's disease Father   . Congestive Heart Failure Brother     PHYSICAL EXAM: Vitals:   08/07/17 0800 08/07/17 0836  BP: 115/82   Pulse: 62 (!) 57  Resp: 15 13  Temp:  97.9 F (36.6 C)  SpO2: 96% 97%     Intake/Output Summary (Last 24 hours) at 08/07/17 0839 Last data filed at 08/07/17 0036  Gross per 24 hour  Intake             66.6 ml  Output             1000 ml  Net           -933.4 ml    General:  Well appearing. No respiratory difficulty HEENT: normal Neck: supple. no JVD. Carotids 2+ bilat; no bruits. No lymphadenopathy or thryomegaly appreciated. Cor: PMI nondisplaced. Regular rate & rhythm. No rubs, gallops or murmurs. Lungs: clear Abdomen: soft, nontender,  nondistended. No hepatosplenomegaly. No bruits or masses. Good bowel sounds. Extremities: no cyanosis, clubbing, rash, edema Neuro: alert & oriented x 3, cranial nerves grossly intact. moves all 4 extremities w/o difficulty. Affect pleasant.  ECG: Polymorhic Vtach  Results for orders placed or performed during the hospital encounter of 08/06/17 (from the past 24 hour(s))  Hepatic function panel     Status: None   Collection Time: 08/06/17  4:24 PM  Result Value Ref Range   Total Protein 6.6 6.5 - 8.1 g/dL   Albumin 3.9 3.5 - 5.0 g/dL   AST 26 15 - 41 U/L   ALT 19 17 - 63 U/L   Alkaline Phosphatase 59 38 - 126 U/L   Total Bilirubin 0.5 0.3 - 1.2 mg/dL   Bilirubin, Direct 0.1 0.1 - 0.5 mg/dL   Indirect Bilirubin 0.4 0.3 - 0.9 mg/dL  Lipase, blood     Status: None   Collection Time: 08/06/17  4:24 PM  Result Value Ref Range   Lipase 46 11 - 51 U/L  Protime-INR     Status: None   Collection Time: 08/06/17  4:24 PM  Result Value Ref Range   Prothrombin Time 12.8 11.4 - 15.2 seconds   INR 0.97   APTT     Status: None   Collection Time: 08/06/17  4:24 PM  Result Value Ref Range   aPTT 31 24 - 36 seconds  Magnesium     Status: Abnormal   Collection Time: 08/06/17  4:24 PM  Result Value Ref Range   Magnesium 1.6 (L) 1.7 - 2.4 mg/dL  Basic metabolic panel     Status: Abnormal   Collection Time: 08/06/17  4:25 PM  Result Value Ref Range   Sodium 137 135 - 145 mmol/L   Potassium 4.2 3.5 - 5.1 mmol/L   Chloride 105 101 - 111 mmol/L   CO2 24 22 - 32 mmol/L   Glucose, Bld 97 65 - 99 mg/dL   BUN 13 6 - 20 mg/dL   Creatinine, Ser 0.98 0.61 - 1.24 mg/dL   Calcium 10.6 (H) 8.9 - 10.3 mg/dL   GFR calc non Af Amer >60 >60 mL/min   GFR calc Af Amer >60 >60 mL/min  Anion gap 8 5 - 15  CBC     Status: None   Collection Time: 08/06/17  4:25 PM  Result Value Ref Range   WBC 4.6 3.8 - 10.6 K/uL   RBC 4.45 4.40 - 5.90 MIL/uL   Hemoglobin 14.0 13.0 - 18.0 g/dL   HCT 40.9 40.0 - 52.0 %    MCV 91.9 80.0 - 100.0 fL   MCH 31.5 26.0 - 34.0 pg   MCHC 34.2 32.0 - 36.0 g/dL   RDW 13.8 11.5 - 14.5 %   Platelets 188 150 - 440 K/uL  Troponin I     Status: None   Collection Time: 08/06/17  4:25 PM  Result Value Ref Range   Troponin I <0.03 <0.03 ng/mL  Troponin I     Status: Abnormal   Collection Time: 08/06/17  8:28 PM  Result Value Ref Range   Troponin I 0.05 (HH) <0.03 ng/mL  MRSA PCR Screening     Status: None   Collection Time: 08/06/17  9:13 PM  Result Value Ref Range   MRSA by PCR NEGATIVE NEGATIVE  Troponin I     Status: Abnormal   Collection Time: 08/07/17  2:02 AM  Result Value Ref Range   Troponin I 0.07 (HH) <0.03 ng/mL  Basic metabolic panel     Status: Abnormal   Collection Time: 08/07/17  2:02 AM  Result Value Ref Range   Sodium 140 135 - 145 mmol/L   Potassium 3.8 3.5 - 5.1 mmol/L   Chloride 109 101 - 111 mmol/L   CO2 28 22 - 32 mmol/L   Glucose, Bld 106 (H) 65 - 99 mg/dL   BUN 12 6 - 20 mg/dL   Creatinine, Ser 0.83 0.61 - 1.24 mg/dL   Calcium 9.7 8.9 - 10.3 mg/dL   GFR calc non Af Amer >60 >60 mL/min   GFR calc Af Amer >60 >60 mL/min   Anion gap 3 (L) 5 - 15  CBC     Status: Abnormal   Collection Time: 08/07/17  2:02 AM  Result Value Ref Range   WBC 3.6 (L) 3.8 - 10.6 K/uL   RBC 4.29 (L) 4.40 - 5.90 MIL/uL   Hemoglobin 13.4 13.0 - 18.0 g/dL   HCT 39.2 (L) 40.0 - 52.0 %   MCV 91.4 80.0 - 100.0 fL   MCH 31.2 26.0 - 34.0 pg   MCHC 34.2 32.0 - 36.0 g/dL   RDW 13.9 11.5 - 14.5 %   Platelets 162 150 - 440 K/uL   Dg Chest Port 1 View  Result Date: 08/06/2017 CLINICAL DATA:  Generalized chest pain that began today while doing yard work. EXAM: PORTABLE CHEST 1 VIEW COMPARISON:  None. FINDINGS: Heart size is normal. Slight tortuosity of the aorta. The pulmonary vascularity is normal. Lungs are clear. No abnormal bone finding. Artifact overlies the chest. IMPRESSION: No active disease evident. Electronically Signed   By: Nelson Chimes M.D.   On: 08/06/2017  16:54     ASSESSMENT AND PLAN: Sustained v. Tach appears polymorphic with h/o CAD and chest pain and will do cath and EP evaluation. Orest Dygert A

## 2017-08-07 NOTE — Progress Notes (Signed)
*  PRELIMINARY RESULTS* Echocardiogram 2D Echocardiogram has been performed.  Sherrie Sport 08/07/2017, 11:50 AM

## 2017-08-08 ENCOUNTER — Encounter: Payer: Self-pay | Admitting: Cardiovascular Disease

## 2017-08-08 LAB — BASIC METABOLIC PANEL
Anion gap: 4 — ABNORMAL LOW (ref 5–15)
BUN: 14 mg/dL (ref 6–20)
CHLORIDE: 109 mmol/L (ref 101–111)
CO2: 27 mmol/L (ref 22–32)
CREATININE: 0.72 mg/dL (ref 0.61–1.24)
Calcium: 9.9 mg/dL (ref 8.9–10.3)
Glucose, Bld: 99 mg/dL (ref 65–99)
POTASSIUM: 4.1 mmol/L (ref 3.5–5.1)
SODIUM: 140 mmol/L (ref 135–145)

## 2017-08-08 LAB — MAGNESIUM: MAGNESIUM: 2.1 mg/dL (ref 1.7–2.4)

## 2017-08-08 MED ORDER — DILTIAZEM HCL ER COATED BEADS 120 MG PO CP24: 120.0000 mg | ORAL_CAPSULE | Freq: Every day | ORAL | 0 refills | Status: DC

## 2017-08-08 MED ORDER — RAMIPRIL 5 MG PO CAPS: 5.0000 mg | ORAL_CAPSULE | Freq: Every day | ORAL | 0 refills | Status: DC

## 2017-08-08 NOTE — Progress Notes (Signed)
Iv and tele removed. D/C instructions given to patient. Pt verbalized understanding, wife at bedside and will be transporting pt to home.

## 2017-08-08 NOTE — Care Management (Signed)
No discharge needs identified by members of the care team 

## 2017-08-08 NOTE — Progress Notes (Signed)
SUBJECTIVE:    Vitals:   08/07/17 1945 08/07/17 2014 08/08/17 0438 08/08/17 0843  BP: 125/71 126/75 125/79 139/72  Pulse: 64 60 63 72  Resp: 18  18   Temp: 98 F (36.7 C) 97.9 F (36.6 C) 98.4 F (36.9 C) 97.6 F (36.4 C)  TempSrc: Oral Oral Oral Oral  SpO2: 98% 95% 96% 95%  Weight:   204 lb 12.8 oz (92.9 kg)   Height:        Intake/Output Summary (Last 24 hours) at 08/08/17 0906 Last data filed at 08/08/17 0058  Gross per 24 hour  Intake           317.21 ml  Output              650 ml  Net          -332.79 ml    LABS: Basic Metabolic Panel:  Recent Labs  08/06/17 1624  08/07/17 0202 08/08/17 0500  NA  --   < > 140 140  K  --   < > 3.8 4.1  CL  --   < > 109 109  CO2  --   < > 28 27  GLUCOSE  --   < > 106* 99  BUN  --   < > 12 14  CREATININE  --   < > 0.83 0.72  CALCIUM  --   < > 9.7 9.9  MG 1.6*  --   --  2.1  < > = values in this interval not displayed. Liver Function Tests:  Recent Labs  08/06/17 1624  AST 26  ALT 19  ALKPHOS 59  BILITOT 0.5  PROT 6.6  ALBUMIN 3.9    Recent Labs  08/06/17 1624  LIPASE 46   CBC:  Recent Labs  08/06/17 1625 08/07/17 0202  WBC 4.6 3.6*  HGB 14.0 13.4  HCT 40.9 39.2*  MCV 91.9 91.4  PLT 188 162   Cardiac Enzymes:  Recent Labs  08/06/17 1625 08/06/17 2028 08/07/17 0202  TROPONINI <0.03 0.05* 0.07*   BNP: Invalid input(s): POCBNP D-Dimer: No results for input(s): DDIMER in the last 72 hours. Hemoglobin A1C: No results for input(s): HGBA1C in the last 72 hours. Fasting Lipid Panel: No results for input(s): CHOL, HDL, LDLCALC, TRIG, CHOLHDL, LDLDIRECT in the last 72 hours. Thyroid Function Tests: No results for input(s): TSH, T4TOTAL, T3FREE, THYROIDAB in the last 72 hours.  Invalid input(s): FREET3 Anemia Panel: No results for input(s): VITAMINB12, FOLATE, FERRITIN, TIBC, IRON, RETICCTPCT in the last 72 hours.   PHYSICAL EXAM General: Well developed, well nourished, in no acute  distress HEENT:  Normocephalic and atramatic Neck:  No JVD.  Lungs: Clear bilaterally to auscultation and percussion. Heart: HRRR . Normal S1 and S2 without gallops or murmurs.  Abdomen: Bowel sounds are positive, abdomen soft and non-tender  Msk:  Back normal, normal gait. Normal strength and tone for age. Extremities: No clubbing, cyanosis or edema.   Neuro: Alert and oriented X 3. Psych:  Good affect, responds appropriately  TELEMETRY: nsr  ASSESSMENT AND PLAN: Mild CAD with LVEF 47%, and VT  Due to outflow tract type , and cardizem and ace inhibitiors are recommended, and d/c amiodrone and metoprolol. May go home on cardizem and f/u Monday at 10 AM, and will do holter.  Active Problems:   Sustained ventricular tachycardia (HCC)    Shawn Laming A, MD, [facility] 08/08/2017 9:06 AM

## 2017-08-08 NOTE — Progress Notes (Signed)
Pt. Asleep and resting

## 2017-08-08 NOTE — Care Management Important Message (Signed)
Important Message  Patient Details  Name: NORRIS BRUMBACH MRN: [id number] Date of Birth: [birth year]   Medicare Important Message Given:  N/A - LOS <3 / Initial given by admissions    Katrina Stack, RN 08/08/2017, 3:26 PM

## 2017-08-08 NOTE — Progress Notes (Signed)
[place] for electrolyte management   No Known Allergies  Patient Measurements: Height: 6\' 2"  (188 cm) Weight: 204 lb 12.8 oz (92.9 kg) IBW/kg (Calculated) : 82.2  Vital Signs: Temp: 97.6 F (36.4 C) (09/07 0843) Temp Source: Oral (09/07 0843) BP: 139/72 (09/07 0843) Pulse Rate: 72 (09/07 0843) Intake/Output from previous day: 09/06 0701 - 09/07 0700 In: 317.2 [I.V.:317.2] Out: 650 [Urine:650] Intake/Output from this shift: No intake/output data recorded.  Labs:  Recent Labs  08/06/17 1624 08/06/17 1625 08/07/17 0202 08/08/17 0500  WBC  --  4.6 3.6*  --   HGB  --  14.0 13.4  --   HCT  --  40.9 39.2*  --   PLT  --  188 162  --   APTT 31  --   --   --   CREATININE  --  0.98 0.83 0.72  MG 1.6*  --   --  2.1  ALBUMIN 3.9  --   --   --   PROT 6.6  --   --   --   AST 26  --   --   --   ALT 19  --   --   --   ALKPHOS 59  --   --   --   BILITOT 0.5  --   --   --   BILIDIR 0.1  --   --   --   IBILI 0.4  --   --   --    Estimated Creatinine Clearance: 101.3 mL/min (by C-G formula based on SCr of 0.72 mg/dL).   Medical History: Past Medical History:  Diagnosis Date  . Basal cell carcinoma   . CAD (coronary artery disease)   . Hyperlipidemia   . Hypertension   . Shingles     Pharmacy consulted for electrolyte management for 71 yo male admitted with sustained vtach.   Plan:  K 4.1,  Mag 2.1. No supplementation needed.   Will recheck electrolytes with am labs.   Milford Cilento A 08/08/2017,9:23 AM

## 2017-08-10 ENCOUNTER — Emergency Department (HOSPITAL_COMMUNITY): Payer: Medicare Other

## 2017-08-10 ENCOUNTER — Encounter (HOSPITAL_COMMUNITY): Payer: Self-pay | Admitting: Emergency Medicine

## 2017-08-10 ENCOUNTER — Other Ambulatory Visit: Payer: Self-pay

## 2017-08-10 ENCOUNTER — Inpatient Hospital Stay (HOSPITAL_COMMUNITY)
Admission: EM | Admit: 2017-08-10 | Discharge: 2017-08-15 | DRG: 274 | Disposition: A | Discharge status: Home / Self Care | Payer: Medicare Other | Attending: Internal Medicine | Admitting: Internal Medicine

## 2017-08-10 DIAGNOSIS — Z85828 Personal history of other malignant neoplasm of skin: Secondary | ICD-10-CM | POA: Diagnosis not present

## 2017-08-10 DIAGNOSIS — I2510 Atherosclerotic heart disease of native coronary artery without angina pectoris: Secondary | ICD-10-CM

## 2017-08-10 DIAGNOSIS — I472 Ventricular tachycardia, unspecified: Secondary | ICD-10-CM

## 2017-08-10 DIAGNOSIS — E785 Hyperlipidemia, unspecified: Secondary | ICD-10-CM | POA: Diagnosis present

## 2017-08-10 DIAGNOSIS — Z7902 Long term (current) use of antithrombotics/antiplatelets: Secondary | ICD-10-CM

## 2017-08-10 DIAGNOSIS — I10 Essential (primary) hypertension: Secondary | ICD-10-CM

## 2017-08-10 DIAGNOSIS — Z79899 Other long term (current) drug therapy: Secondary | ICD-10-CM | POA: Diagnosis not present

## 2017-08-10 DIAGNOSIS — Z8249 Family history of ischemic heart disease and other diseases of the circulatory system: Secondary | ICD-10-CM | POA: Diagnosis not present

## 2017-08-10 DIAGNOSIS — Z87891 Personal history of nicotine dependence: Secondary | ICD-10-CM

## 2017-08-10 DIAGNOSIS — I428 Other cardiomyopathies: Secondary | ICD-10-CM

## 2017-08-10 DIAGNOSIS — I429 Cardiomyopathy, unspecified: Secondary | ICD-10-CM | POA: Diagnosis present

## 2017-08-10 DIAGNOSIS — Z7982 Long term (current) use of aspirin: Secondary | ICD-10-CM | POA: Diagnosis not present

## 2017-08-10 HISTORY — DX: Ventricular tachycardia: I47.2

## 2017-08-10 HISTORY — DX: Ventricular tachycardia, unspecified: I47.20

## 2017-08-10 LAB — COMPREHENSIVE METABOLIC PANEL
ALT: 21 U/L (ref 17–63)
AST: 28 U/L (ref 15–41)
Albumin: 3.5 g/dL (ref 3.5–5.0)
Alkaline Phosphatase: 58 U/L (ref 38–126)
Anion gap: 6 (ref 5–15)
BILIRUBIN TOTAL: 0.8 mg/dL (ref 0.3–1.2)
BUN: 10 mg/dL (ref 6–20)
CO2: 23 mmol/L (ref 22–32)
CREATININE: 0.95 mg/dL (ref 0.61–1.24)
Calcium: 10.4 mg/dL — ABNORMAL HIGH (ref 8.9–10.3)
Chloride: 109 mmol/L (ref 101–111)
Glucose, Bld: 141 mg/dL — ABNORMAL HIGH (ref 65–99)
Potassium: 4 mmol/L (ref 3.5–5.1)
Sodium: 138 mmol/L (ref 135–145)
TOTAL PROTEIN: 6.6 g/dL (ref 6.5–8.1)

## 2017-08-10 LAB — CBC WITH DIFFERENTIAL/PLATELET
BASOS ABS: 0 10*3/uL (ref 0.0–0.1)
Basophils Relative: 0 %
EOS PCT: 5 %
Eosinophils Absolute: 0.2 10*3/uL (ref 0.0–0.7)
HEMATOCRIT: 44.3 % (ref 39.0–52.0)
Hemoglobin: 14.5 g/dL (ref 13.0–17.0)
LYMPHS ABS: 1.1 10*3/uL (ref 0.7–4.0)
LYMPHS PCT: 22 %
MCH: 31 pg (ref 26.0–34.0)
MCHC: 32.7 g/dL (ref 30.0–36.0)
MCV: 94.9 fL (ref 78.0–100.0)
MONO ABS: 0.8 10*3/uL (ref 0.1–1.0)
Monocytes Relative: 16 %
NEUTROS ABS: 2.8 10*3/uL (ref 1.7–7.7)
Neutrophils Relative %: 57 %
PLATELETS: 141 10*3/uL — AB (ref 150–400)
RBC: 4.67 MIL/uL (ref 4.22–5.81)
RDW: 13.4 % (ref 11.5–15.5)
WBC: 4.9 10*3/uL (ref 4.0–10.5)

## 2017-08-10 LAB — I-STAT TROPONIN, ED: TROPONIN I, POC: 0.02 ng/mL (ref 0.00–0.08)

## 2017-08-10 LAB — PROTIME-INR
INR: 1.01
Prothrombin Time: 13.2 seconds (ref 11.4–15.2)

## 2017-08-10 MED ORDER — ASPIRIN EC 81 MG PO TBEC
81.0000 mg | DELAYED_RELEASE_TABLET | Freq: Every day | ORAL | Status: DC
  Administered 2017-08-11 – 2017-08-15 (×5): 81 mg via ORAL
  Filled 2017-08-10 (×5): qty 1

## 2017-08-10 MED ORDER — SODIUM CHLORIDE 0.9% FLUSH
3.0000 mL | Freq: Two times a day (BID) | INTRAVENOUS | Status: DC
  Administered 2017-08-11 – 2017-08-15 (×7): 3 mL via INTRAVENOUS

## 2017-08-10 MED ORDER — ATORVASTATIN CALCIUM 80 MG PO TABS
80.0000 mg | ORAL_TABLET | Freq: Every day | ORAL | Status: DC
  Administered 2017-08-10 – 2017-08-14 (×5): 80 mg via ORAL
  Filled 2017-08-10 (×5): qty 1

## 2017-08-10 MED ORDER — MAGNESIUM SULFATE IN D5W 1-5 GM/100ML-% IV SOLN
1.0000 g | Freq: Once | INTRAVENOUS | Status: DC
  Filled 2017-08-10: qty 100

## 2017-08-10 MED ORDER — ONDANSETRON HCL 4 MG/2ML IJ SOLN: 4.0000 mg | Freq: Four times a day (QID) | INTRAMUSCULAR | Status: DC | PRN

## 2017-08-10 MED ORDER — LORATADINE 10 MG PO TABS
10.0000 mg | ORAL_TABLET | Freq: Every day | ORAL | Status: DC
  Administered 2017-08-11 – 2017-08-15 (×5): 10 mg via ORAL
  Filled 2017-08-10 (×5): qty 1

## 2017-08-10 MED ORDER — AMIODARONE HCL IN DEXTROSE 360-4.14 MG/200ML-% IV SOLN
30.0000 mg/h | INTRAVENOUS | Status: DC
  Administered 2017-08-11: 30 mg/h via INTRAVENOUS
  Filled 2017-08-10: qty 200

## 2017-08-10 MED ORDER — ENOXAPARIN SODIUM 40 MG/0.4ML ~~LOC~~ SOLN
40.0000 mg | SUBCUTANEOUS | Status: DC
  Administered 2017-08-10 – 2017-08-15 (×4): 40 mg via SUBCUTANEOUS
  Filled 2017-08-10 (×5): qty 0.4

## 2017-08-10 MED ORDER — MAGNESIUM SULFATE 2 GM/50ML IV SOLN
2.0000 g | Freq: Once | INTRAVENOUS | Status: AC
Start: 2017-08-10 — End: 2017-08-10
  Administered 2017-08-10: 2 g via INTRAVENOUS

## 2017-08-10 MED ORDER — SODIUM CHLORIDE 0.9 % IV SOLN
250.0000 mL | INTRAVENOUS | Status: DC | PRN
  Administered 2017-08-14: 11:00:00 via INTRAVENOUS

## 2017-08-10 MED ORDER — NIACIN ER (ANTIHYPERLIPIDEMIC) 500 MG PO TBCR
1000.0000 mg | EXTENDED_RELEASE_TABLET | Freq: Every day | ORAL | Status: DC
  Administered 2017-08-10 – 2017-08-14 (×5): 1000 mg via ORAL
  Filled 2017-08-10 (×5): qty 2

## 2017-08-10 MED ORDER — SODIUM CHLORIDE 0.9% FLUSH: 3.0000 mL | INTRAVENOUS | Status: DC | PRN

## 2017-08-10 MED ORDER — ZOLPIDEM TARTRATE 5 MG PO TABS: 5.0000 mg | ORAL_TABLET | Freq: Every evening | ORAL | Status: DC | PRN

## 2017-08-10 MED ORDER — ALPRAZOLAM 0.25 MG PO TABS: 0.2500 mg | ORAL_TABLET | Freq: Two times a day (BID) | ORAL | Status: DC | PRN

## 2017-08-10 MED ORDER — DILTIAZEM HCL ER COATED BEADS 120 MG PO CP24
120.0000 mg | ORAL_CAPSULE | Freq: Every day | ORAL | Status: DC
  Administered 2017-08-11 – 2017-08-15 (×5): 120 mg via ORAL
  Filled 2017-08-10 (×6): qty 1

## 2017-08-10 MED ORDER — PANTOPRAZOLE SODIUM 40 MG PO TBEC
40.0000 mg | DELAYED_RELEASE_TABLET | Freq: Every day | ORAL | Status: DC
  Administered 2017-08-11 – 2017-08-15 (×5): 40 mg via ORAL
  Filled 2017-08-10 (×5): qty 1

## 2017-08-10 MED ORDER — AMIODARONE HCL IN DEXTROSE 360-4.14 MG/200ML-% IV SOLN
60.0000 mg/h | INTRAVENOUS | Status: DC
  Administered 2017-08-10: 60 mg/h via INTRAVENOUS
  Filled 2017-08-10 (×2): qty 200

## 2017-08-10 MED ORDER — AMIODARONE LOAD VIA INFUSION
150.0000 mg | Freq: Once | INTRAVENOUS | Status: AC
  Administered 2017-08-10: 150 mg via INTRAVENOUS
  Filled 2017-08-10: qty 83.34

## 2017-08-10 MED ORDER — ACETAMINOPHEN 325 MG PO TABS
650.0000 mg | ORAL_TABLET | ORAL | Status: DC | PRN
  Administered 2017-08-11 – 2017-08-14 (×3): 650 mg via ORAL
  Filled 2017-08-10 (×3): qty 2

## 2017-08-10 MED ORDER — RAMIPRIL 2.5 MG PO CAPS
5.0000 mg | ORAL_CAPSULE | Freq: Every day | ORAL | Status: DC
  Administered 2017-08-11 – 2017-08-15 (×5): 5 mg via ORAL
  Filled 2017-08-10 (×3): qty 2
  Filled 2017-08-10: qty 1
  Filled 2017-08-10 (×3): qty 2

## 2017-08-10 NOTE — ED Notes (Signed)
Pt moved to a regular hosp bed  No pain nsr on monitor

## 2017-08-10 NOTE — ED Notes (Signed)
The pt is alert no pain nsr on monitor

## 2017-08-10 NOTE — ED Triage Notes (Signed)
Pt arrived in ED by [facility] for Vtach pot awake and alert, pts heart rate 190's, ems gave 6mg  adenosine and 150mg  ammodrione.

## 2017-08-10 NOTE — ED Notes (Signed)
Pt converted into NSR.  

## 2017-08-10 NOTE — ED Notes (Signed)
After giving report to [facility] RN states charge nurse informed her that pt could not come to their unit due to staffing reasons at this time.

## 2017-08-10 NOTE — ED Notes (Signed)
Family at bedside. 

## 2017-08-10 NOTE — ED Notes (Signed)
ED Provider at bedside. Spoke with MD Oran Rein en route to ED

## 2017-08-10 NOTE — ED Notes (Signed)
MD Oran Rein at bedside.

## 2017-08-10 NOTE — H&P (Addendum)
ELECTROPHYSIOLOGY ADMISSION NOTE    Primary Care Physician: [name], MD Primary Cardiologist:  Dr Humphrey Rolls Primary EP:  Dr Casilda Carls Date: 08/10/2017  Reason for admit:  VT  Shawn Shepard is a 71 y.o. male with a h/o recently diagnosed ventricular tachycardia who presents with recurrent sustained hemodynamically stable VT.  He reports being in usual state of health until this am when he developed abrupt tachypalpitations with associated diaphoresis and SOB.  He presents to [facility] and is found to have sustained VT.  He denies presyncope or syncope.  In ED, he received IV Amiodarone and is now in sinus rhythm.  Presently he is resting comfortably. He did have similar presentation last week for which he presented to [facility] (notes from Dr Humphrey Rolls and Caryl Comes are reviewed).  Cath revealed mild nonobstructive CAD.  EF is 45%.    Today, he denies symptoms of chest pain, orthopnea, PND, lower extremity edema, dizziness, presyncope, syncope, or neurologic sequela. The patient is tolerating medications without difficulties and is otherwise without complaint today.   Past Medical History:  Diagnosis Date  . Basal cell carcinoma   . CAD (coronary artery disease)   . Hyperlipidemia   . Hypertension   . Shingles   . Ventricular tachycardia [facility])    Past Surgical History:  Procedure Laterality Date  . CARDIAC CATHETERIZATION N/A 01/16/2016   Procedure: Left Heart Cath and Coronary Angiography;  Surgeon: [name], MD;  Location: [place] CV [facility];  Service: Cardiovascular;  Laterality: N/A;  . CORONARY ANGIOPLASTY  2000 and 2003  . GANGLION CYST EXCISION  1979  . LEFT HEART CATH Right 08/07/2017   Procedure: Left Heart Cath;  Surgeon: [name], MD;  Location: [place] CV [facility];  Service: Cardiovascular;  Laterality: Right;  . PILONIDAL CYST EXCISION  1977  . SKIN CANCER EXCISION  1999   ear  . VEIN SURGERY  2000   laser     . amiodarone 60 mg/hr (08/10/17 1013)  .  amiodarone    . magnesium sulfate 1 - 4 g bolus IVPB 2 g (08/10/17 1014)    No Known Allergies  Social History   Social History  . Marital status: Married    Spouse name: N/A  . Number of children: 2  . Years of education: N/A   Occupational History  . Not on file.   Social History Main Topics  . Smoking status: Former Smoker    Types: Cigarettes    Quit date: 06/21/1971  . Smokeless tobacco: Former Systems developer    Types: Chew    Quit date: 06/20/1980  . Alcohol use Yes     Comment: 1-2 a week  . Drug use: No  . Sexual activity: Not on file   Other Topics Concern  . Not on file   Social History Narrative   Lives in [place]   Retired school principal    Family History  Problem Relation Age of Onset  . Hypertension Mother   . Heart disease Mother   . Heart attack Mother   . Diabetes Mother   . Alzheimer's disease Father   . Congestive Heart Failure Brother     ROS- All systems are reviewed and negative except as per the HPI above  Physical Exam: Telemetry: Vitals:   08/10/17 1015 08/10/17 1017 08/10/17 1018 08/10/17 1021  BP: 98/77   134/86  Pulse: 62 84 81 84  Resp: 18 (!) 23 19 19   SpO2: 96% 98%  99% 99%    GEN- The patient is well appearing, alert and oriented x 3 today.   Head- normocephalic, atraumatic Eyes-  Sclera clear, conjunctiva pink Ears- hearing intact Oropharynx- clear Neck- supple, no JVP Lymph- no cervical lymphadenopathy Lungs- Clear to ausculation bilaterally, normal work of breathing Heart- Regular rate and rhythm, no murmurs, rubs or gallops, PMI not laterally displaced GI- soft, NT, ND, + BS Extremities- no clubbing, cyanosis, or edema MS- no significant deformity or atrophy Skin- no rash or lesion Psych- euthymic mood, full affect Neuro- strength and sensation are intact  EKG reveals VT with LBB inferior axis and precordial transition at V2  Labs:   Lab Results  Component Value Date   WBC 3.6 (L) 08/07/2017   HGB 13.4  08/07/2017   HCT 39.2 (L) 08/07/2017   MCV 91.4 08/07/2017   PLT 162 08/07/2017    Recent Labs Lab 08/06/17 1624  08/08/17 0500  NA  --   < > 140  K  --   < > 4.1  CL  --   < > 109  CO2  --   < > 27  BUN  --   < > 14  CREATININE  --   < > 0.72  CALCIUM  --   < > 9.9  PROT 6.6  --   --   BILITOT 0.5  --   --   ALKPHOS 59  --   --   ALT 19  --   --   AST 26  --   --   GLUCOSE  --   < > 99  < > = values in this interval not displayed. Lab Results  Component Value Date   TROPONINI 0.07 [facility]) 08/07/2017    Lab Results  Component Value Date   CHOL 128 03/04/2017   CHOL 130 09/10/2016   CHOL 114 12/28/2015   Lab Results  Component Value Date   HDL 46 03/04/2017   HDL 47 09/10/2016   HDL 55 06/21/2015   Lab Results  Component Value Date   LDLCALC 59 03/04/2017   LDLCALC 62 09/10/2016   LDLCALC 56 06/21/2015   Lab Results  Component Value Date   TRIG 116 03/04/2017   TRIG 103 09/10/2016   TRIG 65 12/28/2015   Lab Results  Component Value Date   CHOLHDL 2.8 03/04/2017   CHOLHDL 2.8 09/10/2016   CHOLHDL 2.3 06/21/2015   No results found for: LDLDIRECT     Echo: as above  ASSESSMENT AND PLAN:   1. VT The patient presents with hemodynamically stable symptomatic sustained VT.  I agree with Dr Aquilla Hacker prior assessment that this is likely outflow tract VT.  Etiology appears benign. Will admit for ablation as our schedule allows.  Hopefully, I will be able to perform on Tuesday.  Will continue amiodarone today but discontinue if still stable this afternoon. I agree with Dr Caryl Comes that Ic agent could also be considered in this patient.  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 HTN Stable No change required today  4. CAD Nonobstructive, mild On statin  Admit to stepdown EP to manage  Very complicated patient at risk for decompensation/ further arrhythmias.  Plan is  for catheter ablation later in the week.  A high level of decision making was required for this encounter.  Thompson Grayer, MD 08/10/2017  10:36 AM

## 2017-08-10 NOTE — ED Notes (Signed)
Family at bedside. Pt given urinal

## 2017-08-10 NOTE — ED Notes (Signed)
MEDS FOR 200 REQUESTED FROM PHARMACY

## 2017-08-10 NOTE — ED Provider Notes (Signed)
[place] DEPT Provider Note   CSN: [id number] Arrival date & time: 08/10/17  6433     History   Chief Complaint Chief Complaint  Patient presents with  . Tachycardia    HPI KEYONDRE HEPBURN is a 71 y.o. male.  HPI   71 year old male with past medical history significant for CAD, on Plavix. Patient had recent hospitalization at [facility] after being found to be in polymorphic V. Tach. Patient was discharged on Cardizem. Patient follows Dr. Caryl Comes and Dr. Chancy Milroy.Patient was discharged from the hospital 2 days ago. Felt himself going to dysrhythmia this morning called EMS. Patient was found to be in V. Tach again. 150 of amiodarone given prior to arrival.  Past Medical History:  Diagnosis Date  . Basal cell carcinoma   . CAD (coronary artery disease)   . Hyperlipidemia   . Hypertension   . Shingles   . Ventricular tachycardia [facility])     Patient Active Problem List   Diagnosis Date Noted  . Ventricular tachycardia ([place]) 08/10/2017  . Sustained ventricular tachycardia ([place]) 08/06/2017  . Arthritis of knee 06/23/2017  . Rectal bleeding 03/04/2017  . Reflux esophagitis 03/04/2017  . CAD (coronary artery disease)   . Basal cell carcinoma   . Hyperlipidemia   . Hypertension   . Shingles     Past Surgical History:  Procedure Laterality Date  . CARDIAC CATHETERIZATION N/A 01/16/2016   Procedure: Left Heart Cath and Coronary Angiography;  Surgeon: [name], MD;  Location: [facility] CV LAB;  Service: Cardiovascular;  Laterality: N/A;  . CORONARY ANGIOPLASTY  2000 and 2003  . GANGLION CYST EXCISION  1979  . LEFT HEART CATH Right 08/07/2017   Procedure: Left Heart Cath;  Surgeon: [name], MD;  Location: [place] CV LAB;  Service: Cardiovascular;  Laterality: Right;  . PILONIDAL CYST EXCISION  1977  . SKIN CANCER EXCISION  1999   ear  . VEIN SURGERY  2000   laser       Home Medications    Prior to Admission medications   Medication Sig Start Date  End Date Taking? Authorizing Provider  aspirin EC 81 MG tablet Take 81 mg by mouth daily.   Yes [provider]  atorvastatin (LIPITOR) 80 MG tablet Take 1 tablet (80 mg total) by mouth at bedtime. 09/10/16  Yes Crissman, Jeannette How, MD  clopidogrel (PLAVIX) 75 MG tablet Take 1 tablet (75 mg total) by mouth daily. 09/10/16  Yes Crissman, Jeannette How, MD  diltiazem (CARDIZEM CD) 120 MG 24 hr capsule Take 1 capsule (120 mg total) by mouth daily. 08/09/17  Yes [name], MD  fexofenadine (ALLEGRA) 180 MG tablet Take 1 tablet (180 mg total) by mouth daily. 03/04/17  Yes Crissman, Jeannette How, MD  niacin (NIASPAN) 1000 MG CR tablet Take 1 tablet (1,000 mg total) by mouth at bedtime. 09/10/16  Yes [name], MD  pantoprazole (PROTONIX) 40 MG tablet take 1 tablet by mouth once daily Patient taking differently: Take 40 mg by mouth daily 07/07/17  Yes Crissman, Jeannette How, MD  ramipril (ALTACE) 5 MG capsule Take 1 capsule (5 mg total) by mouth daily. 08/09/17  Yes [name], MD  alendronate (FOSAMAX) 35 MG tablet Take 1 tablet (35 mg total) by mouth every 7 (seven) days. Take with a full glass of water on an empty stomach. Patient not taking: Reported on 08/06/2017 05/27/17   [name], MD  hydrocortisone (ANUSOL-HC) 2.5 % rectal cream Place 1  application rectally 2 (two) times daily. Patient not taking: Reported on 08/10/2017 03/04/17   [name], MD  meloxicam (MOBIC) 15 MG tablet Take 1 tablet (15 mg total) by mouth daily. Patient not taking: Reported on 08/10/2017 06/23/17   [name], MD    Family History Family History  Problem Relation Age of Onset  . Hypertension Mother   . Heart disease Mother   . Heart attack Mother   . Diabetes Mother   . Alzheimer's disease Father   . Congestive Heart Failure Brother     Social History Social History  Substance Use Topics  . Smoking status: Former Smoker    Types: Cigarettes    Quit date: 06/21/1971  . Smokeless tobacco:  Former Systems developer    Types: Chew    Quit date: 06/20/1980  . Alcohol use Yes     Comment: 1-2 a week     Allergies   Patient has no known allergies.   Review of Systems Review of Systems  Constitutional: Negative for activity change.  Respiratory: Positive for chest tightness. Negative for shortness of breath.   Cardiovascular: Positive for chest pain.  Gastrointestinal: Negative for abdominal pain.  All other systems reviewed and are negative.    Physical Exam Updated Vital Signs BP (!) 151/90 (BP Location: Left Arm)   Pulse 68   Temp 97.7 F (36.5 C) (Oral)   Resp 17   Ht 6\' 2"  (1.88 m)   Wt 95.2 kg (209 lb 14.1 oz)   SpO2 95%   BMI 26.95 kg/m   Physical Exam  Constitutional: He is oriented to person, place, and time. He appears well-nourished.  HENT:  Head: Normocephalic.  Eyes: Conjunctivae are normal.  Cardiovascular:  tachy  Pulmonary/Chest: Effort normal and breath sounds normal. No respiratory distress.  Abdominal: Soft. He exhibits no distension. There is no tenderness.  Neurological: He is oriented to person, place, and time.  Skin: Skin is warm and dry. He is not diaphoretic.  Psychiatric: He has a normal mood and affect. His behavior is normal.     ED Treatments / Results  Labs (all labs ordered are listed, but only abnormal results are displayed) Labs Reviewed  CBC WITH DIFFERENTIAL/PLATELET - Abnormal; Notable for the following:       Result Value   Platelets 141 (*)    All other components within normal limits  COMPREHENSIVE METABOLIC PANEL - Abnormal; Notable for the following:    Glucose, Bld 141 (*)    Calcium 10.4 (*)    All other components within normal limits  BASIC METABOLIC PANEL - Abnormal; Notable for the following:    Glucose, Bld 102 (*)    Anion gap 4 (*)    All other components within normal limits  CBC - Abnormal; Notable for the following:    RBC 4.17 (*)    Hemoglobin 12.7 (*)    HCT 38.8 (*)    Platelets 147 (*)    All  other components within normal limits  MRSA PCR SCREENING  PROTIME-INR  I-STAT TROPONIN, ED    EKG  EKG Interpretation  Date/Time:  Sunday August 10 2017 10:19:38 EDT Ventricular Rate:  85 PR Interval:    QRS Duration: 105 QT Interval:  368 QTC Calculation: 438 R Axis:   0 Text Interpretation:  Sinus rhythm Multiple ventricular premature complexes Abnormal R-wave progression, early transition Normal sinus rhythm Confirmed by Thomasene Lot, [place] [phone]) on 08/11/2017 6:59:06 AM       Radiology  Dg Chest Portable 1 View  Result Date: 08/10/2017 CLINICAL DATA:  Patient with V-tach. EXAM: PORTABLE CHEST 1 VIEW COMPARISON:  Chest radiograph 08/06/2017 FINDINGS: Monitoring leads and pacer pad overlies the patient. Stable cardiomegaly with tortuosity of the thoracic aorta. No consolidative pulmonary opacities. No pleural effusion or pneumothorax. Basilar atelectasis. IMPRESSION: No acute cardiopulmonary process. Electronically Signed   By: Lovey Newcomer M.D.   On: 08/10/2017 10:37    Procedures Procedures (including critical care time)  Medications Ordered in ED Medications  amiodarone (NEXTERONE PREMIX) 360-4.14 MG/200ML-% (1.8 mg/mL) IV infusion (30 mg/hr Intravenous Transfusing/Transfer 08/11/17 0606)  diltiazem (CARDIZEM CD) 24 hr capsule 120 mg (not administered)  ramipril (ALTACE) capsule 5 mg (not administered)  pantoprazole (PROTONIX) EC tablet 40 mg (40 mg Oral Not Given 08/10/17 2338)  loratadine (CLARITIN) tablet 10 mg (10 mg Oral Not Given 08/10/17 2339)  atorvastatin (LIPITOR) tablet 80 mg (80 mg Oral Given 08/10/17 2156)  aspirin EC tablet 81 mg (81 mg Oral Not Given 08/10/17 2340)  niacin (NIASPAN) CR tablet 1,000 mg (1,000 mg Oral Given 08/10/17 2155)  acetaminophen (TYLENOL) tablet 650 mg (not administered)  ondansetron (ZOFRAN) injection 4 mg (not administered)  enoxaparin (LOVENOX) injection 40 mg (40 mg Subcutaneous Given 08/10/17 2158)  sodium chloride flush (NS) 0.9 %  injection 3 mL (3 mLs Intravenous Given 08/11/17 0510)  sodium chloride flush (NS) 0.9 % injection 3 mL (not administered)  0.9 %  sodium chloride infusion (not administered)  zolpidem (AMBIEN) tablet 5 mg (not administered)  ALPRAZolam (XANAX) tablet 0.25 mg (not administered)  amiodarone (NEXTERONE) 1.8 mg/mL load via infusion 150 mg (150 mg Intravenous Bolus from Bag 08/10/17 1013)  magnesium sulfate IVPB 2 g 50 mL (0 g Intravenous Stopped 08/10/17 1120)     Initial Impression / Assessment and Plan / ED Course  I have reviewed the triage vital signs and the nursing notes.  Pertinent labs & imaging results that were available during my care of the patient were reviewed by me and considered in my medical decision making (see chart for details).    71 year old male with past medical history significant for CAD, on Plavix. Patient had recent hospitalization at [facility] after being found to be in polymorphic V. Tach. Patient was discharged on Cardizem. Patient follows Dr. Caryl Comes and Dr. Chancy Milroy.Patient was discharged from the hospital 2 days ago. Felt himself going to dysrhythmia this morning called EMS. Patient was found to be in V. Tach again. 150 of amiodarone given prior to arrival.  On arrival patient placed on pads, EKG confirms Vtach- polymorphic. Mag , fluids nad Amio ordered.   6:59 AM Patient given another 150 mg of amiodarone over 10 minutes. For the tail end of the secondbolus, patient cardioverted.  Patient did have chest pressure and since he has already started on Cardizem, cardiology was called. They will admit.  CRITICAL CARE Performed by: Gardiner Sleeper Total critical care time: 60 minutes Critical care time was exclusive of separately billable procedures and treating other patients. Critical care was necessary to treat or prevent imminent or life-threatening deterioration. Critical care was time spent personally by me on the following activities: development of treatment  plan with patient and/or surrogate as well as nursing, discussions with consultants, evaluation of patient's response to treatment, examination of patient, obtaining history from patient or surrogate, ordering and performing treatments and interventions, ordering and review of laboratory studies, ordering and review of radiographic studies, pulse oximetry and re-evaluation of patient's condition.   Final  Clinical Impressions(s) / ED Diagnoses   Final diagnoses:  None    New Prescriptions New Prescriptions   No medications on file     Macarthur Critchley, MD 08/11/17 [phone]

## 2017-08-11 LAB — CBC
HEMATOCRIT: 38.8 % — AB (ref 39.0–52.0)
HEMOGLOBIN: 12.7 g/dL — AB (ref 13.0–17.0)
MCH: 30.5 pg (ref 26.0–34.0)
MCHC: 32.7 g/dL (ref 30.0–36.0)
MCV: 93 fL (ref 78.0–100.0)
Platelets: 147 10*3/uL — ABNORMAL LOW (ref 150–400)
RBC: 4.17 MIL/uL — ABNORMAL LOW (ref 4.22–5.81)
RDW: 13.6 % (ref 11.5–15.5)
WBC: 4.5 10*3/uL (ref 4.0–10.5)

## 2017-08-11 LAB — BASIC METABOLIC PANEL
ANION GAP: 4 — AB (ref 5–15)
BUN: 7 mg/dL (ref 6–20)
CO2: 26 mmol/L (ref 22–32)
Calcium: 9.5 mg/dL (ref 8.9–10.3)
Chloride: 110 mmol/L (ref 101–111)
Creatinine, Ser: 0.8 mg/dL (ref 0.61–1.24)
GFR calc Af Amer: >60 mL/min (ref >60)
GLUCOSE: 102 mg/dL — AB (ref 65–99)
POTASSIUM: 4.4 mmol/L (ref 3.5–5.1)
Sodium: 140 mmol/L (ref 135–145)

## 2017-08-11 LAB — MRSA PCR SCREENING: MRSA by PCR: NEGATIVE

## 2017-08-11 MED FILL — Medication: Qty: 1 | Status: AC

## 2017-08-11 NOTE — Progress Notes (Signed)
Progress Note  Patient Name: Shawn Shepard Date of Encounter: 08/11/2017  Primary Cardiologist: Humphrey Rolls Primary EP: Caryl Comes  Subjective   Doing well today, the patient denies CP or SOB.  No new concerns  No further VT  Inpatient Medications    Scheduled Meds: . aspirin EC  81 mg Oral Daily  . atorvastatin  80 mg Oral QHS  . diltiazem  120 mg Oral Daily  . enoxaparin (LOVENOX) injection  40 mg Subcutaneous Q24H  . loratadine  10 mg Oral Daily  . niacin  1,000 mg Oral QHS  . pantoprazole  40 mg Oral Daily  . ramipril  5 mg Oral Daily  . sodium chloride flush  3 mL Intravenous Q12H   Continuous Infusions: . sodium chloride     PRN Meds: sodium chloride, acetaminophen, ALPRAZolam, ondansetron (ZOFRAN) IV, sodium chloride flush, zolpidem   Vital Signs    Vitals:   08/11/17 0300 08/11/17 0400 08/11/17 0500 08/11/17 0624  BP: (!) 144/87 (!) 141/89 133/87 (!) 151/90  Pulse: 67 66 61 68  Resp: 16 14 20 17   Temp:    97.7 F (36.5 C)  TempSrc:    Oral  SpO2: 94% 94% 93% 95%  Weight:    209 lb 14.1 oz (95.2 kg)  Height:    6\' 2"  (1.88 m)    Intake/Output Summary (Last 24 hours) at 08/11/17 0708 Last data filed at 08/11/17 0324  Gross per 24 hour  Intake               50 ml  Output             1600 ml  Net            -1550 ml   Filed Weights   08/11/17 0624  Weight: 209 lb 14.1 oz (95.2 kg)    Telemetry    Sinus with occasional PVCs - Personally Reviewed  Physical Exam   GEN- The patient is well appearing, alert and oriented x 3 today.   Head- normocephalic, atraumatic Eyes-  Sclera clear, conjunctiva pink Ears- hearing intact Oropharynx- clear Neck- supple, Lungs- Clear to ausculation bilaterally, normal work of breathing Heart- Regular rate and rhythm  GI- soft, NT, ND, + BS Extremities- no clubbing, cyanosis, or edema  MS- no significant deformity or atrophy Skin- no rash or lesion Psych- euthymic mood, full affect Neuro- strength and sensation are  intact   Labs    Chemistry Recent Labs Lab 08/06/17 1624  08/08/17 0500 08/10/17 1034 08/11/17 0512  NA  --   < > 140 138 140  K  --   < > 4.1 4.0 4.4  CL  --   < > 109 109 110  CO2  --   < > 27 23 26   GLUCOSE  --   < > 99 141* 102*  BUN  --   < > 14 10 7   CREATININE  --   < > 0.72 0.95 0.80  CALCIUM  --   < > 9.9 10.4* 9.5  PROT 6.6  --   --  6.6  --   ALBUMIN 3.9  --   --  3.5  --   AST 26  --   --  28  --   ALT 19  --   --  21  --   ALKPHOS 59  --   --  58  --   BILITOT 0.5  --   --  0.8  --  GFRNONAA  --   < > >60 >60 >60  GFRAA  --   < > >60 >60 >60  ANIONGAP  --   < > 4* 6 4*  < > = values in this interval not displayed.   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 VT No further episodes overnight Stop amidoarone Therapeutic strategies for ventricular tachycardia including medicine and ablation were discussed in detail with the patient today. Risk, benefits, and alternatives to EP study and radiofrequency ablation were also discussed in detail today. These risks include but are not limited to stroke, bleeding, vascular damage, tamponade, perforation, damage to the heart and other structures, AV block requiring pacemaker, worsening renal function, and death. The patient understands these risk and wishes to proceed.  We will therefore proceed with catheter ablation at the next available time.  I have tentatively placed on schedule for tomorrow afternoon.    2.  Nonischemic CM Mildly depressed EF Hopefully EF will recover with treatment of VT  3. HTN Stable No change required today   Up ad lib today  Thompson Grayer MD, [place] 08/11/2017 7:08 AM

## 2017-08-11 NOTE — Discharge Summary (Signed)
Shawn Shepard, is a 71 y.o. male  DOB 11-16-1946  MRN [id number].  Admission date:  08/06/2017  Admitting Physician  Hillary Bow, MD  Discharge Date:  08/08/2017   Primary MD  [name], MD  Recommendations for primary care physician for things to follow:   Follow with Dr.Shaukat khan as scheuled ,next Monday on sep 10th at 10 am   Admission Diagnosis  Ventricular tachycardia ([place]) [I47.2] Chest pain [R07.9] Chest pain, unspecified type [R07.9]   Discharge Diagnosis  Ventricular tachycardia ([place]) [I47.2] Chest pain [R07.9] Chest pain, unspecified type [R07.9]    Active Problems:   Sustained ventricular tachycardia ([facility])      Past Medical History:  Diagnosis Date  . Basal cell carcinoma   . CAD (coronary artery disease)   . Hyperlipidemia   . Hypertension   . Shingles   . Ventricular tachycardia [facility])     Past Surgical History:  Procedure Laterality Date  . CARDIAC CATHETERIZATION N/A 01/16/2016   Procedure: Left Heart Cath and Coronary Angiography;  Surgeon: [name], MD;  Location: [place] CV LAB;  Service: Cardiovascular;  Laterality: N/A;  . CORONARY ANGIOPLASTY  2000 and 2003  . GANGLION CYST EXCISION  1979  . LEFT HEART CATH Right 08/07/2017   Procedure: Left Heart Cath;  Surgeon: [name], MD;  Location: [place] CV LAB;  Service: Cardiovascular;  Laterality: Right;  . PILONIDAL CYST EXCISION  1977  . SKIN CANCER EXCISION  1999   ear  . VEIN SURGERY  2000   laser       History of present illness and  Hospital Course:     Kindly see H&P for history of present illness and admission details, please review complete Labs, Consult reports and Test reports for all details in brief  HPI  from the history and physical done on the day of admission ILLNESS:  Shawn Shepard  is a 71 y.o. male with a known history of CAD, hypertension, atrial fibrillation presents to the emergency room complaining of acute onset of palpitations, nausea, dizziness, chest pain and shortness of breath while mowing his lawn. He called EMS and was found to be sustained ventricular tachycardia. He had 2 boluses of amiodarone and then amiodarone infusion started. Converted normal sinus rhythm. Had a total of 2 hours of ventricular tachycardia. Repeat EKG once normal sinus rhythm returned shows no significant ST depression or elevation. Case discussed with Dr. Humphrey Rolls his cardiologist and advised admission.  Hospital Course  1.Sustained ventricular tachycardia;admitted to icu,started on amiodarone drip.weaned of drip.Echocardiogram showed EF 45% with hypokinesia of anteroseptal myocardium, regional wall motion and normal 80s. Cardiac cath showed minimal cad. Seen by.DR.Caryl Comes.started on cardizem CD 120 mg daily and started on ACE.stopped  Bblocker.folowwith EP in 2 weeks for holter.      condition:stable   Follow UP Follow up with  EP 2 weeks.with Dr.Klein.     Discharge Instructions  and  Discharge Medications     Allergies as of 08/08/2017   No Known Allergies     Medication List    STOP taking these medications   metoprolol succinate 50 MG 24 hr tablet Commonly known as:  TOPROL-XL     TAKE these medications   alendronate 35 MG tablet Commonly known as:  FOSAMAX Take 1 tablet (35 mg total) by mouth every 7 (seven) days. Take with a full glass of water on an empty stomach.   aspirin EC 81 MG tablet Take  81 mg by mouth daily.   atorvastatin 80 MG tablet Commonly known as:  LIPITOR Take 1 tablet (80 mg total) by mouth at bedtime.   clopidogrel 75 MG tablet Commonly known as:  PLAVIX Take 1 tablet (75 mg total) by mouth daily.   diltiazem 120 MG 24 hr capsule Commonly known as:  CARDIZEM CD Take 1 capsule (120 mg total) by mouth daily.   fexofenadine 180  MG tablet Commonly known as:  ALLEGRA Take 1 tablet (180 mg total) by mouth daily.   hydrocortisone 2.5 % rectal cream Commonly known as:  ANUSOL-HC Place 1 application rectally 2 (two) times daily.   meloxicam 15 MG tablet Commonly known as:  MOBIC Take 1 tablet (15 mg total) by mouth daily.   niacin 1000 MG CR tablet Commonly known as:  NIASPAN Take 1 tablet (1,000 mg total) by mouth at bedtime.   pantoprazole 40 MG tablet Commonly known as:  PROTONIX take 1 tablet by mouth once daily What changed:  See the new instructions.   ramipril 5 MG capsule Commonly known as:  ALTACE Take 1 capsule (5 mg total) by mouth daily. What changed:  See the new instructions.         Diet and Activity recommendation: See Discharge Instructions above   Consults obtained -cardio   Major procedures and Radiology Reports - PLEASE review detailed and final reports for all details, in brief -     Dg Chest Portable 1 View  Result Date: 08/10/2017 CLINICAL DATA:  Patient with V-tach. EXAM: PORTABLE CHEST 1 VIEW COMPARISON:  Chest radiograph 08/06/2017 FINDINGS: Monitoring leads and pacer pad overlies the patient. Stable cardiomegaly with tortuosity of the thoracic aorta. No consolidative pulmonary opacities. No pleural effusion or pneumothorax. Basilar atelectasis. IMPRESSION: No acute cardiopulmonary process. Electronically Signed   By: Lovey Newcomer M.D.   On: 08/10/2017 10:37   Dg Chest Port 1 View  Result Date: 08/06/2017 CLINICAL DATA:  Generalized chest pain that began today while doing yard work. EXAM: PORTABLE CHEST 1 VIEW COMPARISON:  None. FINDINGS: Heart size is normal. Slight tortuosity of the aorta. The pulmonary vascularity is normal. Lungs are clear. No abnormal bone finding. Artifact overlies the chest. IMPRESSION: No active disease evident. Electronically Signed   By: Nelson Chimes M.D.   On: 08/06/2017 16:54    Micro Results     Recent Results (from the past 240 hour(s))   MRSA PCR Screening     Status: None   Collection Time: 08/06/17  9:13 PM  Result Value Ref Range Status   MRSA by PCR NEGATIVE NEGATIVE Final    Comment:        The GeneXpert MRSA Assay (FDA approved for NASAL specimens only), is one component of a comprehensive MRSA colonization surveillance program. It is not intended to diagnose MRSA infection nor to guide or monitor treatment for MRSA infections.   MRSA PCR Screening     Status: None   Collection Time: 08/11/17  6:53 AM  Result Value Ref Range Status   MRSA by PCR NEGATIVE NEGATIVE Final    Comment:        The GeneXpert MRSA Assay (FDA approved for NASAL specimens only), is one component of a comprehensive MRSA colonization surveillance program. It is not intended to diagnose MRSA infection nor to guide or monitor treatment for MRSA infections.        Today   Subjective:   Jovaughn Wojtaszek today has no headache,no chest abdominal pain,no new weakness  tingling or numbness, feels much better wants to go home today.   Objective:   Blood pressure 115/79, pulse 66, temperature 97.6 F (36.4 C), temperature source Oral, resp. rate 14, height 6\' 2"  (1.88 m), weight 92.9 kg (204 lb 12.8 oz), SpO2 95 %.  No intake or output data in the 24 hours ending 08/11/17 2316  Exam Awake Alert, Oriented x 3, No new F.N deficits, Normal affect [place].AT,PERRAL Supple Neck,No JVD, No cervical lymphadenopathy appriciated.  Symmetrical Chest wall movement, Good air movement bilaterally, CTAB RRR,No Gallops,Rubs or new Murmurs, No Parasternal Heave +ve B.Sounds, Abd Soft, Non tender, No organomegaly appriciated, No rebound -guarding or rigidity. No Cyanosis, Clubbing or edema, No new Rash or bruise  Data Review   CBC w Diff:  Lab Results  Component Value Date   WBC 4.5 08/11/2017   HGB 12.7 (L) 08/11/2017   HGB 13.5 04/08/2017   HCT 38.8 (L) 08/11/2017   HCT 40.2 04/08/2017   PLT 147 (L) 08/11/2017   PLT 170 04/08/2017    LYMPHOPCT 22 08/10/2017   MONOPCT 16 08/10/2017   EOSPCT 5 08/10/2017   BASOPCT 0 08/10/2017    CMP:  Lab Results  Component Value Date   NA 140 08/11/2017   NA 141 03/04/2017   K 4.4 08/11/2017   CL 110 08/11/2017   CO2 26 08/11/2017   BUN 7 08/11/2017   BUN 11 03/04/2017   CREATININE 0.80 08/11/2017   PROT 6.6 08/10/2017   PROT 6.2 09/10/2016   ALBUMIN 3.5 08/10/2017   ALBUMIN 4.1 09/10/2016   BILITOT 0.8 08/10/2017   BILITOT 0.5 09/10/2016   ALKPHOS 58 08/10/2017   AST 28 08/10/2017   AST 48 (H) 12/28/2015   ALT 21 08/10/2017   ALT 29 12/28/2015  .   Total Time in preparing paper work, data evaluation and todays exam - 67 minutes  Amelio Brosky M.D on 08/08/2017 at 11:16 PM    Note: This dictation was prepared with Dragon dictation along with smaller phrase technology. Any transcriptional errors that result from this process are unintentional.

## 2017-08-11 NOTE — ED Notes (Signed)
Nurse will draw labs. 

## 2017-08-12 ENCOUNTER — Encounter (HOSPITAL_COMMUNITY)
Admission: EM | Disposition: A | Discharge status: Home / Self Care | Payer: Self-pay | Source: Home / Self Care | Attending: Internal Medicine

## 2017-08-12 SURGERY — V TACH ABLATION

## 2017-08-12 MED ORDER — SODIUM CHLORIDE 0.9 % IV SOLN
INTRAVENOUS | Status: DC
  Administered 2017-08-12 – 2017-08-13 (×2): via INTRAVENOUS

## 2017-08-12 NOTE — Progress Notes (Signed)
Progress Note  Patient Name: Shawn Shepard Date of Encounter: 08/12/2017  Primary Cardiologist: Humphrey Rolls Primary EP: Caryl Comes  Subjective   Doing well again today, the patient denies CP or SOB.  No new concerns  No further VT  Inpatient Medications    Scheduled Meds: . aspirin EC  81 mg Oral Daily  . atorvastatin  80 mg Oral QHS  . diltiazem  120 mg Oral Daily  . enoxaparin (LOVENOX) injection  40 mg Subcutaneous Q24H  . loratadine  10 mg Oral Daily  . niacin  1,000 mg Oral QHS  . pantoprazole  40 mg Oral Daily  . ramipril  5 mg Oral Daily  . sodium chloride flush  3 mL Intravenous Q12H   Continuous Infusions: . sodium chloride Stopped (08/12/17 0400)  . sodium chloride 50 mL/hr at 08/12/17 0850   PRN Meds: sodium chloride, acetaminophen, ALPRAZolam, ondansetron (ZOFRAN) IV, sodium chloride flush, zolpidem   Vital Signs    Vitals:   08/11/17 2329 08/12/17 0400 08/12/17 0429 08/12/17 0716  BP: (!) 152/89  125/79 139/83  Pulse: 76 (!) 115 85 81  Resp: 19 (!) 22 15 19   Temp: 97.7 F (36.5 C)  97.7 F (36.5 C) 97.6 F (36.4 C)  TempSrc: Oral  Oral Oral  SpO2: 94% 97% 94% 96%  Weight:      Height:        Intake/Output Summary (Last 24 hours) at 08/12/17 0939 Last data filed at 08/12/17 0800  Gross per 24 hour  Intake             1720 ml  Output             1260 ml  Net              460 ml   Filed Weights   08/11/17 0624  Weight: 209 lb 14.1 oz (95.2 kg)    Telemetry    SR, 90's-100, occasional PVCs, couplets 3 beat NSVT - Personally Reviewed  Physical Exam    GEN- The patient is well appearing, alert and oriented x 3 today.   Head- normocephalic, atraumatic Eyes-  Sclera clear, conjunctiva pink Ears- hearing intact Oropharynx- clear Neck- supple, Lungs- CTA b/l, normal work of breathing Heart- RRR, extrasystoles appreciated GI- soft, NT, ND Extremities- no clubbing, cyanosis, or edema  MS- no significant deformity or atrophy Skin- no rash or  lesion Psych- euthymic mood, full affect Neuro- strength and sensation are intact   Labs    Chemistry Recent Labs Lab 08/06/17 1624  08/08/17 0500 08/10/17 1034 08/11/17 0512  NA  --   < > 140 138 140  K  --   < > 4.1 4.0 4.4  CL  --   < > 109 109 110  CO2  --   < > 27 23 26   GLUCOSE  --   < > 99 141* 102*  BUN  --   < > 14 10 7   CREATININE  --   < > 0.72 0.95 0.80  CALCIUM  --   < > 9.9 10.4* 9.5  PROT 6.6  --   --  6.6  --   ALBUMIN 3.9  --   --  3.5  --   AST 26  --   --  28  --   ALT 19  --   --  21  --   ALKPHOS 59  --   --  58  --   BILITOT 0.5  --   --  0.8  --   GFRNONAA  --   < > >60 >60 >60  GFRAA  --   < > >60 >60 >60  ANIONGAP  --   < > 4* 6 4*  < > = values in this interval not displayed.   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 VT NSVT very brief episodes only 3 beats Amidoarone stopped yesterday AM He is scheduled for EPS/ablation this afternoon with Dr. Rayann Heman   Patient's follow up questions were answered, he would like to proceed as planned  2. Nonischemic CM Mildly depressed EF Hopefully EF will recover with treatment of VT  3. HTN Stable No change required today     Tommye Standard, PA-C 08/12/2017 9:39 AM   I have seen, examined the patient, and reviewed the above assessment and plan.  On exam, RRR.  Changes to above are made where necessary.  Unfortunately, unable to proceed with ablation today due to equipment issues.  Will  try to place on the schedule for tomorrow.  Will feed the patient.  Co Sign: Thompson Grayer, MD 08/12/2017 11:54 AM

## 2017-08-13 ENCOUNTER — Other Ambulatory Visit: Payer: Self-pay

## 2017-08-13 MED ORDER — FLECAINIDE ACETATE 100 MG PO TABS
100.0000 mg | ORAL_TABLET | Freq: Two times a day (BID) | ORAL | Status: DC
  Administered 2017-08-13 (×2): 100 mg via ORAL
  Filled 2017-08-13 (×3): qty 1

## 2017-08-13 MED ORDER — FLECAINIDE ACETATE 100 MG PO TABS
100.0000 mg | ORAL_TABLET | Freq: Once | ORAL | Status: AC
  Administered 2017-08-13: 100 mg via ORAL
  Filled 2017-08-13 (×2): qty 1

## 2017-08-13 MED ORDER — ADENOSINE 6 MG/2ML IV SOLN
INTRAVENOUS | Status: AC
  Filled 2017-08-13: qty 2

## 2017-08-13 NOTE — Progress Notes (Signed)
Pt heart rhythm changed from ST with PVCs- EKG performed and updated NP Ursuy- then patient's rhythm changed to [facility] in 150's- patient having no pain and asymptomaticCharlcie Cradle, NP at bedside- plan to reschedule for patient to have ablation tomorrow- another dose of Flecainide ordered.  Order to give no adenosine.  Will continue to monitor.

## 2017-08-13 NOTE — Progress Notes (Signed)
The patient has returned to sustained VT rates 150's, BP stable w/SBP 130, the patient is aware though initially told RN has no symptoms until all the people came into his room, making him a bit worried.  He tells me he feels well.    Dr. Lovena Le has reviewed his telemetry, and discussed with Dr. Rayann Heman.  Plan is Flecainide 100mg  once now and will put on schedule tomorrow with Dr. Curt Bears.  I have discussed this with the patient and his RN.  Reviewed with RN, could take some time to convert, as long as BP is stable, the patient is asymptomatic, mentating to follow will allow time for flecainide to work.  Tommye Standard, PA-C

## 2017-08-13 NOTE — Progress Notes (Signed)
Progress Note  Patient Name: Abagail Kitchens Date of Encounter: 08/13/2017  Primary Cardiologist: Humphrey Rolls Primary EP: Caryl Comes  LATE ENTRY NOTE: The patient was seen and examined today by Dr. Rayann Heman and planned for discharge after flecainide dose/monitoring.   He developed recurrent VT and discharge held   Subjective   At the time of VT he felt slightly worried/anxious with the attention though otherwise asymptomatic, he was seen again post VT to SR conversion, feeling well,  without CP, SOB, though he could tell the difference when his HR slowed.  Inpatient Medications    Scheduled Meds: . aspirin EC  81 mg Oral Daily  . atorvastatin  80 mg Oral QHS  . diltiazem  120 mg Oral Daily  . enoxaparin (LOVENOX) injection  40 mg Subcutaneous Q24H  . flecainide  100 mg Oral Q12H  . loratadine  10 mg Oral Daily  . niacin  1,000 mg Oral QHS  . pantoprazole  40 mg Oral Daily  . ramipril  5 mg Oral Daily  . sodium chloride flush  3 mL Intravenous Q12H   Continuous Infusions: . sodium chloride Stopped (08/12/17 0400)  . sodium chloride 50 mL/hr at 08/13/17 0701   PRN Meds: sodium chloride, acetaminophen, ALPRAZolam, ondansetron (ZOFRAN) IV, sodium chloride flush, zolpidem   Vital Signs    Vitals:   08/13/17 1600 08/13/17 1700 08/13/17 1900 08/13/17 1938  BP: 128/75 114/75 111/86 134/84  Pulse: 92 80 (!) 103 92  Resp: 16 (!) 22 17 (!) 24  Temp:    98.6 F (37 C)  TempSrc:    Oral  SpO2: 95% 97% 94% 100%  Weight:      Height:        Intake/Output Summary (Last 24 hours) at 08/13/17 2232 Last data filed at 08/13/17 1713  Gross per 24 hour  Intake              450 ml  Output             1800 ml  Net            -1350 ml   Filed Weights   08/11/17 0624  Weight: 209 lb 14.1 oz (95.2 kg)    Telemetry    SR, 90's-100, occasional PVCs, couplets 3 beat NSVT >> sustained VT 150's, >> SR 90's, occ PVCs- Personally Reviewed  Physical Exam    Exam remains unchanged  GEN-  The patient is well appearing, alert and oriented x 3 today.   Head- normocephalic, atraumatic Eyes-  Sclera clear, conjunctiva pink Ears- hearing intact Oropharynx- clear Neck- supple, Lungs- CTA b/l, normal work of breathing Heart- RRR, no gallops or rubs, no murmurs GI- soft, NT, ND Extremities- no clubbing, cyanosis, or edema  MS- no significant deformity or atrophy Skin- remained dry, not diaphoretic  Psych- euthymic mood, full affect Neuro- strength and sensation are intact   Labs    Chemistry  Recent Labs Lab 08/08/17 0500 08/10/17 1034 08/11/17 0512  NA 140 138 140  K 4.1 4.0 4.4  CL 109 109 110  CO2 27 23 26   GLUCOSE 99 141* 102*  BUN 14 10 7   CREATININE 0.72 0.95 0.80  CALCIUM 9.9 10.4* 9.5  PROT  --  6.6  --   ALBUMIN  --  3.5  --   AST  --  28  --   ALT  --  21  --   ALKPHOS  --  58  --   BILITOT  --  0.8  --   GFRNONAA >60 >60 >60  GFRAA >60 >60 >60  ANIONGAP 4* 6 4*     Hematology  Recent Labs Lab 08/07/17 0202 08/10/17 1034 08/11/17 0512  WBC 3.6* 4.9 4.5  RBC 4.29* 4.67 4.17*  HGB 13.4 14.5 12.7*  HCT 39.2* 44.3 38.8*  MCV 91.4 94.9 93.0  MCH 31.2 31.0 30.5  MCHC 34.2 32.7 32.7  RDW 13.9 13.4 13.6  PLT 162 141* 147*    Cardiac Enzymes  Recent Labs Lab 08/07/17 0202  TROPONINI 0.07*     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 VT The patient developed sustained, hemodynamically stable VT and discharge held Initially planned for discharge to return Tuesday for ablation as an out patient, though given recurrent sustained VT, planned for EPS/ablation procedure tomorrow with Dr. Curt Bears I discussed this with the patient and his wife at bedside, they are in agreement  I reviewed today's EKG/VT with Dr. Lovena Le felt c/w prior EKGs VT, continue Flecainide as scheduled  2. Nonischemic CM Mildly  depressed EF Hopefully EF will recover with treatment of VT  3. HTN Stable No change required today    Tommye Standard, PA-C 08/13/2017 10:32 PM

## 2017-08-13 NOTE — Progress Notes (Signed)
Doing well Unfortunately, unable to proceed with inpatient ablation due to equipment issues.  Will send home on flecainide Return on Tuesday at noon for ablation.  Thompson Grayer MD, [facility] 08/13/2017 11:11 AM

## 2017-08-13 NOTE — Progress Notes (Signed)
Patient converted from [facility] to NSR with PVCs at 15:45.  Paged Charlcie Cradle, NP to update her.

## 2017-08-13 NOTE — Plan of Care (Signed)
Problem: Bowel/Gastric: Goal: Will not experience complications related to bowel motility Outcome: Not Progressing Usually has trouble outside of home, has not yet had Bm, but passing flatus

## 2017-08-14 ENCOUNTER — Inpatient Hospital Stay (HOSPITAL_COMMUNITY): Payer: Medicare Other | Admitting: Certified Registered Nurse Anesthetist

## 2017-08-14 ENCOUNTER — Other Ambulatory Visit: Payer: Self-pay

## 2017-08-14 ENCOUNTER — Encounter (HOSPITAL_COMMUNITY)
Admission: EM | Disposition: A | Discharge status: Home / Self Care | Payer: Self-pay | Source: Home / Self Care | Attending: Internal Medicine

## 2017-08-14 ENCOUNTER — Encounter (HOSPITAL_COMMUNITY): Payer: Self-pay | Admitting: Anesthesiology

## 2017-08-14 HISTORY — PX: V TACH ABLATION: EP1227

## 2017-08-14 LAB — POCT ACTIVATED CLOTTING TIME
ACTIVATED CLOTTING TIME: 164 s
ACTIVATED CLOTTING TIME: 224 s
Activated Clotting Time: 125 seconds

## 2017-08-14 SURGERY — V TACH ABLATION
Anesthesia: Monitor Anesthesia Care

## 2017-08-14 MED ORDER — SODIUM CHLORIDE 0.9% FLUSH: 3.0000 mL | INTRAVENOUS | Status: DC | PRN

## 2017-08-14 MED ORDER — PROPOFOL 500 MG/50ML IV EMUL
INTRAVENOUS | Status: DC | PRN
  Administered 2017-08-14: 25 ug/kg/min via INTRAVENOUS

## 2017-08-14 MED ORDER — MIDAZOLAM HCL 2 MG/2ML IJ SOLN
INTRAMUSCULAR | Status: DC | PRN
  Administered 2017-08-14: 2 mg via INTRAVENOUS

## 2017-08-14 MED ORDER — SODIUM CHLORIDE 0.9 % IV SOLN: 250.0000 mL | INTRAVENOUS | Status: DC | PRN

## 2017-08-14 MED ORDER — ACETAMINOPHEN 325 MG PO TABS
650.0000 mg | ORAL_TABLET | ORAL | Status: DC | PRN
  Administered 2017-08-14: 650 mg via ORAL
  Filled 2017-08-14: qty 2

## 2017-08-14 MED ORDER — HEPARIN (PORCINE) IN NACL 2-0.9 UNIT/ML-% IJ SOLN
INTRAMUSCULAR | Status: AC | PRN
  Administered 2017-08-14: 1000 mL
  Administered 2017-08-14: 500 mL

## 2017-08-14 MED ORDER — PROTAMINE SULFATE 10 MG/ML IV SOLN
INTRAVENOUS | Status: DC | PRN
  Administered 2017-08-14: 40 mg via INTRAVENOUS

## 2017-08-14 MED ORDER — PROPOFOL 10 MG/ML IV BOLUS
INTRAVENOUS | Status: DC | PRN
  Administered 2017-08-14: 20 mg via INTRAVENOUS

## 2017-08-14 MED ORDER — FENTANYL CITRATE (PF) 100 MCG/2ML IJ SOLN
INTRAMUSCULAR | Status: DC | PRN
  Administered 2017-08-14 (×3): 50 ug via INTRAVENOUS

## 2017-08-14 MED ORDER — HEPARIN SODIUM (PORCINE) 1000 UNIT/ML IJ SOLN
INTRAMUSCULAR | Status: DC | PRN
  Administered 2017-08-14 (×2): 5000 [IU] via INTRAVENOUS

## 2017-08-14 MED ORDER — SODIUM CHLORIDE 0.9% FLUSH
3.0000 mL | Freq: Two times a day (BID) | INTRAVENOUS | Status: DC
  Administered 2017-08-14 – 2017-08-15 (×2): 3 mL via INTRAVENOUS

## 2017-08-14 MED ORDER — LIDOCAINE HCL (PF) 1 % IJ SOLN
INTRAMUSCULAR | Status: DC | PRN
  Administered 2017-08-14: 30 mL

## 2017-08-14 MED ORDER — HEPARIN SODIUM (PORCINE) 1000 UNIT/ML IJ SOLN
INTRAMUSCULAR | Status: AC
  Filled 2017-08-14: qty 1

## 2017-08-14 MED ORDER — HEPARIN (PORCINE) IN NACL 2-0.9 UNIT/ML-% IJ SOLN
INTRAMUSCULAR | Status: AC
  Filled 2017-08-14: qty 500

## 2017-08-14 MED ORDER — HEPARIN SODIUM (PORCINE) 1000 UNIT/ML IJ SOLN
INTRAMUSCULAR | Status: DC | PRN
  Administered 2017-08-14: 1000 [IU] via INTRAVENOUS

## 2017-08-14 MED ORDER — ONDANSETRON HCL 4 MG/2ML IJ SOLN: 4.0000 mg | Freq: Four times a day (QID) | INTRAMUSCULAR | Status: DC | PRN

## 2017-08-14 MED ORDER — LIDOCAINE HCL (PF) 1 % IJ SOLN
INTRAMUSCULAR | Status: AC
  Filled 2017-08-14: qty 30

## 2017-08-14 SURGICAL SUPPLY — 14 items
BAG SNAP BAND KOVER 36X36 (MISCELLANEOUS) ×2 IMPLANT
BLANKET WARM UNDERBOD FULL ACC (MISCELLANEOUS) ×2 IMPLANT
CATH JOSEPHSON QUAD-ALLRED 6FR (CATHETERS) ×2 IMPLANT
CATH SMTCH THERMOCOOL SF DF (CATHETERS) ×2 IMPLANT
CATH SOUNDSTAR ECO REPROCESSED (CATHETERS) ×2 IMPLANT
COVER SWIFTLINK CONNECTOR (BAG) ×2 IMPLANT
PACK EP LATEX FREE (CUSTOM PROCEDURE TRAY) ×1
PACK EP LF (CUSTOM PROCEDURE TRAY) ×1 IMPLANT
PAD DEFIB LIFELINK (PAD) ×2 IMPLANT
PATCH CARTO3 (PAD) ×2 IMPLANT
SHEATH AVANTI 11F 11CM (SHEATH) ×2 IMPLANT
SHEATH PINNACLE 6F 10CM (SHEATH) ×2 IMPLANT
SHEATH PINNACLE 8F 10CM (SHEATH) ×4 IMPLANT
TUBING SMART ABLATE COOLFLOW (TUBING) ×2 IMPLANT

## 2017-08-14 NOTE — Progress Notes (Signed)
Site area: right groin fa and fv sheath Site Prior to Removal:  Level 0 Pressure Applied For: 20 minutes Manual:   yes Patient Status During Pull:  stable Post Pull Site:  Level 0 Post Pull Instructions Given:  yes Post Pull Pulses Present: palpable Dressing Applied:  Gauze and tegaderm Bedrest begins @  Comments:

## 2017-08-14 NOTE — Transfer of Care (Signed)
Immediate Anesthesia Transfer of Care Note  Patient: Shawn Shepard  Procedure(s) Performed: Procedure(s): V Tach Ablation (N/A)  Patient Location: Cath Lab  Anesthesia Type:MAC  Level of Consciousness: awake, alert , oriented and patient cooperative  Airway & Oxygen Therapy: Patient Spontanous Breathing and Patient connected to nasal cannula oxygen  Post-op Assessment: Report given to RN, Post -op Vital signs reviewed and stable and Patient moving all extremities X 4  Post vital signs: Reviewed and stable  Last Vitals:  Vitals:   08/14/17 0726 08/14/17 1435  BP: 136/78   Pulse: 79   Resp: (!) 22   Temp: 36.9 C (!) 36.4 C  SpO2: 96%     Last Pain:  Vitals:   08/14/17 1435  TempSrc: Temporal  PainSc:       Patients Stated Pain Goal: 0 (88/89/16 9450)  Complications: No apparent anesthesia complications

## 2017-08-14 NOTE — Progress Notes (Signed)
Progress Note  Patient Name: Shawn Shepard Date of Encounter: 08/14/2017  Primary Cardiologist: Humphrey Rolls Primary EP: Caryl Comes   Subjective   Iniitally planned for discharge yesterday on flecainide with plans to bring back and ablate next week. Went into VT with palpitations on flecainide. Plan for ablation today.  Inpatient Medications    Scheduled Meds: . aspirin EC  81 mg Oral Daily  . atorvastatin  80 mg Oral QHS  . diltiazem  120 mg Oral Daily  . enoxaparin (LOVENOX) injection  40 mg Subcutaneous Q24H  . loratadine  10 mg Oral Daily  . niacin  1,000 mg Oral QHS  . pantoprazole  40 mg Oral Daily  . ramipril  5 mg Oral Daily  . sodium chloride flush  3 mL Intravenous Q12H   Continuous Infusions: . sodium chloride Stopped (08/12/17 0400)  . sodium chloride 50 mL/hr at 08/13/17 2332   PRN Meds: sodium chloride, acetaminophen, ALPRAZolam, ondansetron (ZOFRAN) IV, sodium chloride flush, zolpidem   Vital Signs    Vitals:   08/13/17 1938 08/13/17 2315 08/14/17 0400 08/14/17 0726  BP: 134/84 127/87 123/66   Pulse: 92 89 73   Resp: (!) 24 18 13    Temp: 98.6 F (37 C) 98.6 F (37 C) 98.6 F (37 C) 98.5 F (36.9 C)  TempSrc: Oral Oral Oral Oral  SpO2: 100% 97% 96%   Weight:      Height:        Intake/Output Summary (Last 24 hours) at 08/14/17 0735 Last data filed at 08/14/17 0400  Gross per 24 hour  Intake          1049.17 ml  Output              600 ml  Net           449.17 ml   Filed Weights   08/11/17 0624  Weight: 209 lb 14.1 oz (95.2 kg)    Telemetry    Sinus rhythm with episode of VT- Personally Reviewed  Physical Exam    GEN: Well nourished, well developed, in no acute distress  HEENT: normal  Neck: no JVD, carotid bruits, or masses Cardiac: RRR; no murmurs, rubs, or gallops,no edema  Respiratory:  clear to auscultation bilaterally, normal work of breathing GI: soft, nontender, nondistended, + BS MS: no deformity or atrophy  Skin: warm and  dry Neuro:  Strength and sensation are intact Psych: euthymic mood, full affect    Labs    Chemistry  Recent Labs Lab 08/08/17 0500 08/10/17 1034 08/11/17 0512  NA 140 138 140  K 4.1 4.0 4.4  CL 109 109 110  CO2 27 23 26   GLUCOSE 99 141* 102*  BUN 14 10 7   CREATININE 0.72 0.95 0.80  CALCIUM 9.9 10.4* 9.5  PROT  --  6.6  --   ALBUMIN  --  3.5  --   AST  --  28  --   ALT  --  21  --   ALKPHOS  --  58  --   BILITOT  --  0.8  --   GFRNONAA >60 >60 >60  GFRAA >60 >60 >60  ANIONGAP 4* 6 4*     Hematology  Recent Labs Lab 08/10/17 1034 08/11/17 0512  WBC 4.9 4.5  RBC 4.67 4.17*  HGB 14.5 12.7*  HCT 44.3 38.8*  MCV 94.9 93.0  MCH 31.0 30.5  MCHC 32.7 32.7  RDW 13.4 13.6  PLT 141* 147*    Cardiac  Enzymes No results for input(s): TROPONINI in the last 168 hours.   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 VT Presented to the hospital with VT. Was put on flecanide but has since had recurrence. Plan for ablation today. Risks and benefits discussed. Risks include but not limited to bleeding, tamponade, heart block, and stroke. The pateint understands the risks and has agreed to the procedure.  2. Nonischemic CM Mildly decreased EF, possibly due to VT. May recover in the future.  3. HTN Stable, no changes today    Kimaria Struthers, MD 08/14/2017 7:35 AM

## 2017-08-14 NOTE — Progress Notes (Signed)
Site area: left groin fv sheaths x2 Site Prior to Removal:  Level 0 Pressure Applied For: 20 minutes Manual:   yes Patient Status During Pull:  stable Post Pull Site:  Level 0 Post Pull Instructions Given:  yes Post Pull Pulses Present: palpable Dressing Applied:  Gauze and tegaderm Bedrest begins @ 1600 Comments:  IV saline locked

## 2017-08-14 NOTE — Progress Notes (Signed)
Paged PA, Percell Locus, and verified medications that could be given pre-op and which ones needed to be held for procedure. Will hold the lovenox.

## 2017-08-14 NOTE — Anesthesia Postprocedure Evaluation (Signed)
Anesthesia Post Note  Patient: Shawn Shepard  Procedure(s) Performed: Procedure(s) (LRB): V Tach Ablation (N/A)     Patient location during evaluation: PACU Anesthesia Type: MAC Level of consciousness: awake and alert Pain management: pain level controlled Vital Signs Assessment: post-procedure vital signs reviewed and stable Respiratory status: spontaneous breathing, nonlabored ventilation, respiratory function stable and patient connected to nasal cannula oxygen Cardiovascular status: stable and blood pressure returned to baseline Postop Assessment: no apparent nausea or vomiting Anesthetic complications: no    Last Vitals:  Vitals:   08/14/17 1435 08/14/17 1440  BP:  129/75  Pulse:  74  Resp:  16  Temp: (!) 36.4 C   SpO2:  92%    Last Pain:  Vitals:   08/14/17 1435  TempSrc: Temporal  PainSc:                  Effie Berkshire

## 2017-08-14 NOTE — Anesthesia Preprocedure Evaluation (Addendum)
Anesthesia Evaluation  Patient identified by MRN, date of birth, ID band Patient awake    Reviewed: Allergy & Precautions, NPO status , Patient's Chart, lab work & pertinent test results  Airway Mallampati: II  TM Distance: >3 FB Neck ROM: Full    Dental  (+) Teeth Intact, Dental Advisory Given   Pulmonary former smoker,    breath sounds clear to auscultation       Cardiovascular hypertension, + CAD  + dysrhythmias Ventricular Tachycardia  Rhythm:Regular Rate:Tachycardia     Neuro/Psych negative neurological ROS  negative psych ROS   GI/Hepatic Neg liver ROS, GERD  ,  Endo/Other  negative endocrine ROS  Renal/GU negative Renal ROS  negative genitourinary   Musculoskeletal  (+) Arthritis , Osteoarthritis,    Abdominal   Peds negative pediatric ROS (+)  Hematology negative hematology ROS (+)   Anesthesia Other Findings - HLD  Reproductive/Obstetrics negative OB ROS                           Lab Results  Component Value Date   WBC 4.5 08/11/2017   HGB 12.7 (L) 08/11/2017   HCT 38.8 (L) 08/11/2017   MCV 93.0 08/11/2017   PLT 147 (L) 08/11/2017   Lab Results  Component Value Date   CREATININE 0.80 08/11/2017   BUN 7 08/11/2017   NA 140 08/11/2017   K 4.4 08/11/2017   CL 110 08/11/2017   CO2 26 08/11/2017    Echo: - Left ventricle: The cavity size was mildly dilated. There was   moderate concentric hypertrophy. Systolic function was mildly   reduced. The estimated ejection fraction was 45%. Hypokinesis of   the anteroseptal myocardium. Doppler parameters are consistent   with abnormal left ventricular relaxation (grade 1 diastolic   dysfunction). - Aortic valve: Valve area (Vmax): 2.2 cm^2. - Mitral valve: There was mild regurgitation. - Left atrium: The atrium was mildly dilated.   Anesthesia Physical Anesthesia Plan  ASA: III  Anesthesia Plan: MAC   Post-op Pain  Management:    Induction: Intravenous  PONV Risk Score and Plan: 2 and Ondansetron, Dexamethasone, Propofol infusion and Treatment may vary due to age or medical condition  Airway Management Planned: Natural Airway  Additional Equipment:   Intra-op Plan:   Post-operative Plan:   Informed Consent: I have reviewed the patients History and Physical, chart, labs and discussed the procedure including the risks, benefits and alternatives for the proposed anesthesia with the patient or authorized representative who has indicated his/her understanding and acceptance.   Dental advisory given  Plan Discussed with: CRNA  Anesthesia Plan Comments:        Anesthesia Quick Evaluation

## 2017-08-15 ENCOUNTER — Encounter (HOSPITAL_COMMUNITY): Payer: Self-pay | Admitting: Cardiology

## 2017-08-15 MED ORDER — FLECAINIDE ACETATE 100 MG PO TABS: 200.0000 mg | ORAL_TABLET | Freq: Once | ORAL | 0 refills | Status: DC

## 2017-08-15 NOTE — Progress Notes (Signed)
Discharge instructions given to patient, all questions answered at this time.  Pt. VSS with no s/s of distress noted.  Patient stable for discharge.   

## 2017-08-15 NOTE — Discharge Summary (Signed)
ELECTROPHYSIOLOGY PROCEDURE DISCHARGE SUMMARY    Patient ID: Shawn Shepard,  MRN: [id number], DOB/AGE: 70-Apr-1947 70 y.o.  Admit date: 08/10/2017 Discharge date: 08/15/2017  Primary Care Physician: Humphrey Rolls Primary Cardiologist: Teiara Baria  Primary Discharge Diagnosis:  1.  Ventricular tachycardia s/p ablation this admission  Secondary Discharge Diagnoses: 1.  Non obstructive CAD 2.  HTN 3.  Basal cell carcinoma  No Known Allergies  Procedures This Admission:  1.  Electrophysiology study and radiofrequency catheter ablation on 08/14/17 by Dr Curt Bears. This study demonstrated easily inducible VT arising from the right coronary cusp that was successfully ablated. There were no early apparent complications.   Brief HPI/Hospital Course:  Shawn Shepard is a 71 y.o. male with a h/o recently diagnosed ventricular tachycardia who presents with recurrent sustained hemodynamically stable VT.  He reports being in usual state of health until the morning of admission when he developed abrupt tachypalpitations with associated diaphoresis and SOB.  He presents to [facility] and is found to have sustained VT.  He denies presyncope or syncope.  In ED, he received IV Amiodarone and converted to sinus rhythm. He had a similar presentation earlier this month for which he presented to [facility] (notes from Dr Humphrey Rolls and Caryl Comes are reviewed).  Cath revealed mild nonobstructive CAD.  EF is 45%.  Amiodarone was discontinued and he underwent successful EPS/ablation by Dr Curt Bears for VT arising from the right coronary cusp. He was monitored on telemetry overnight which demonstrated SR. Groin was without complication. He was evaluated by Dr Rayann Heman and considered stable for discharge to home. Restrictions reviewed.   Will give prn Flecainide to take for recurrent tachycardia  Physical Exam: Vitals:   08/15/17 0300 08/15/17 0400 08/15/17 0500 08/15/17 0700  BP: 118/62 116/65 115/71   Pulse: 70  72   Resp: 13 16 13     Temp:  97.8 F (36.6 C)  98.5 F (36.9 C)  TempSrc:  Oral  Oral  SpO2: 96%  92% 94%  Weight:      Height:        GEN- The patient is well appearing, alert and oriented x 3 today.   HEENT: normocephalic, atraumatic; sclera clear, conjunctiva pink; hearing intact; oropharynx clear; neck supple  Lungs- Clear to ausculation bilaterally, normal work of breathing.  No wheezes, rales, rhonchi Heart- Regular rate and rhythm, no murmurs, rubs or gallops  GI- soft, non-tender, non-distended, bowel sounds present  Extremities- no clubbing, cyanosis, or edema; DP/PT/radial pulses 2+ bilaterally MS- no significant deformity or atrophy Skin- warm and dry, no rash or lesion Psych- euthymic mood, full affect Neuro- strength and sensation are intact    Labs:   Lab Results  Component Value Date   WBC 4.5 08/11/2017   HGB 12.7 (L) 08/11/2017   HCT 38.8 (L) 08/11/2017   MCV 93.0 08/11/2017   PLT 147 (L) 08/11/2017     Recent Labs Lab 08/10/17 1034 08/11/17 0512  NA 138 140  K 4.0 4.4  CL 109 110  CO2 23 26  BUN 10 7  CREATININE 0.95 0.80  CALCIUM 10.4* 9.5  PROT 6.6  --   BILITOT 0.8  --   ALKPHOS 58  --   ALT 21  --   AST 28  --   GLUCOSE 141* 102*     Discharge Medications:  Current Discharge Medication List    START taking these medications   Details  flecainide (TAMBOCOR) 100 MG tablet Take 2 tablets (200 mg total)  by mouth once. As needed for recurrent tachycardia.  Do not repeat more than once per day or 3 times per week Qty: 2 tablet, Refills: 0      CONTINUE these medications which have NOT CHANGED   Details  aspirin EC 81 MG tablet Take 81 mg by mouth daily.    atorvastatin (LIPITOR) 80 MG tablet Take 1 tablet (80 mg total) by mouth at bedtime. Qty: 90 tablet, Refills: 4   Associated Diagnoses: Coronary artery disease due to lipid rich plaque; Pure hypercholesterolemia    clopidogrel (PLAVIX) 75 MG tablet Take 1 tablet (75 mg total) by mouth daily. Qty: 90  tablet, Refills: 4   Associated Diagnoses: Coronary artery disease due to lipid rich plaque    diltiazem (CARDIZEM CD) 120 MG 24 hr capsule Take 1 capsule (120 mg total) by mouth daily. Qty: 30 capsule, Refills: 0    fexofenadine (ALLEGRA) 180 MG tablet Take 1 tablet (180 mg total) by mouth daily. Qty: 30 tablet, Refills: 12    niacin (NIASPAN) 1000 MG CR tablet Take 1 tablet (1,000 mg total) by mouth at bedtime. Qty: 90 tablet, Refills: 4   Associated Diagnoses: Coronary artery disease due to lipid rich plaque; Pure hypercholesterolemia    pantoprazole (PROTONIX) 40 MG tablet take 1 tablet by mouth once daily Qty: 30 tablet, Refills: 3    ramipril (ALTACE) 5 MG capsule Take 1 capsule (5 mg total) by mouth daily. Qty: 30 capsule, Refills: 0    alendronate (FOSAMAX) 35 MG tablet Take 1 tablet (35 mg total) by mouth every 7 (seven) days. Take with a full glass of water on an empty stomach. Qty: 4 tablet, Refills: 12    hydrocortisone (ANUSOL-HC) 2.5 % rectal cream Place 1 application rectally 2 (two) times daily. Qty: 30 g, Refills: 0    meloxicam (MOBIC) 15 MG tablet Take 1 tablet (15 mg total) by mouth daily. Qty: 30 tablet, Refills: 3   Associated Diagnoses: Arthritis of knee        Disposition: Pt is being discharged home today in good condition. Discharge Instructions    Diet - low sodium heart healthy    Complete by:  As directed    Increase activity slowly    Complete by:  As directed      Follow-up Information    Thompson Grayer, MD Follow up on 09/01/2017.   Specialty:  Cardiology Why:  at 8:30AM Contact information: [address] [place] [address] [phone]           Duration of Discharge Encounter: Greater than 30 minutes including physician time.  Signed, Chanetta Marshall, NP 08/15/2017 9:21 AM  I have seen, examined the patient, and reviewed the above assessment and plan.  On exam, RRR. Changes to above are made where necessary.   Doing well s/p ablation .  DC to home.  Close outpatient follow-up.  Co Sign: Thompson Grayer, MD 08/15/2017 10:28 AM

## 2017-08-15 NOTE — Discharge Instructions (Signed)
NO DRIVING 6 MONTHS  No lifting over 5 lbs for 1 week. No vigorous or sexual activity for 1 week. You may return to work on 08/20/17. Keep procedure site clean & dry. If you notice increased pain, swelling, bleeding or pus, call/return!  You may shower, but no soaking baths/hot tubs/pools for 1 week.

## 2017-08-19 ENCOUNTER — Telehealth: Payer: Self-pay | Admitting: Family Medicine

## 2017-08-19 NOTE — Telephone Encounter (Signed)
Called pt to schedule for Annual Wellness Visit with Nurse Health Advisor, Tiffany Hill, my c/b # is [phone]  Kathryn Brown ° °

## 2017-09-01 ENCOUNTER — Encounter: Payer: Self-pay | Admitting: Internal Medicine

## 2017-09-01 ENCOUNTER — Ambulatory Visit (INDEPENDENT_AMBULATORY_CARE_PROVIDER_SITE_OTHER): Payer: Medicare Other | Admitting: Internal Medicine

## 2017-09-01 VITALS — BP 116/70 | HR 64 | Ht 74.0 in | Wt 214.8 lb

## 2017-09-01 DIAGNOSIS — I428 Other cardiomyopathies: Secondary | ICD-10-CM | POA: Diagnosis not present

## 2017-09-01 DIAGNOSIS — I472 Ventricular tachycardia, unspecified: Secondary | ICD-10-CM

## 2017-09-01 DIAGNOSIS — I10 Essential (primary) hypertension: Secondary | ICD-10-CM | POA: Diagnosis not present

## 2017-09-01 NOTE — Progress Notes (Signed)
PCP: [name], MD Primary Cardiologist: Dr Humphrey Rolls Primary EP: Kalmen Lollar is a 71 y.o. male who presents today for routine electrophysiology followup.  Since his recent VT ablation by Dr Curt Bears, the patient reports doing very well.  he denies procedure related complications and is pleased with the results of the procedure.  Today, he denies symptoms of palpitations, chest pain, shortness of breath,  lower extremity edema, dizziness, presyncope, or syncope.  The patient is otherwise without complaint today.   Past Medical History:  Diagnosis Date  . Basal cell carcinoma   . CAD (coronary artery disease)   . Hyperlipidemia   . Hypertension   . Shingles   . Ventricular tachycardia [facility])    Past Surgical History:  Procedure Laterality Date  . CARDIAC CATHETERIZATION N/A 01/16/2016   Procedure: Left Heart Cath and Coronary Angiography;  Surgeon: [name], MD;  Location: [place] [facility];  Service: Cardiovascular;  Laterality: N/A;  . CORONARY ANGIOPLASTY  2000 and 2003  . GANGLION CYST EXCISION  1979  . LEFT HEART CATH Right 08/07/2017   Procedure: Left Heart Cath;  Surgeon: [name], MD;  Location: [place] [facility];  Service: Cardiovascular;  Laterality: Right;  . PILONIDAL CYST EXCISION  1977  . SKIN CANCER EXCISION  1999   ear  . V TACH ABLATION N/A 08/14/2017   Procedure: V Tach Ablation;  Surgeon: [name], MD;  Location: [place] [facility];  Service: Cardiovascular;  Laterality: N/A;  . VEIN SURGERY  2000   laser    ROS- all systems are personally reviewed and negatives except as per HPI above  Current Outpatient Prescriptions  Medication Sig Dispense Refill  . aspirin EC 81 MG tablet Take 81 mg by mouth daily.    Marland Kitchen atorvastatin (LIPITOR) 80 MG tablet Take 1 tablet (80 mg total) by mouth at bedtime. 90 tablet 4  . CALCIUM PO Take 600 mg by mouth daily.    Marland Kitchen diltiazem (CARDIZEM CD) 180 MG 24 hr capsule Take 180 mg by mouth  daily.  0  . fexofenadine (ALLEGRA) 180 MG tablet Take 1 tablet (180 mg total) by mouth daily. 30 tablet 12  . flecainide (TAMBOCOR) 100 MG tablet Take 2 tablets (200 mg total) by mouth once. As needed for recurrent tachycardia.  Do not repeat more than once per day or 3 times per week 2 tablet 0  . hydrocortisone (ANUSOL-HC) 2.5 % rectal cream Place 1 application rectally 2 (two) times daily. 30 g 0  . metoprolol succinate (TOPROL-XL) 25 MG 24 hr tablet Take 25 mg by mouth daily.    . niacin (NIASPAN) 1000 MG CR tablet Take 1 tablet (1,000 mg total) by mouth at bedtime. 90 tablet 4  . pantoprazole (PROTONIX) 40 MG tablet take 1 tablet by mouth once daily (Patient taking differently: Take 40 mg by mouth daily) 30 tablet 3  . ramipril (ALTACE) 5 MG capsule Take 1 capsule (5 mg total) by mouth daily. 30 capsule 0   No current facility-administered medications for this visit.     Physical Exam: Vitals:   09/01/17 0826  BP: 116/70  Pulse: 64  SpO2: 97%  Weight: 214 lb 12.8 oz (97.4 kg)  Height: 6\' 2"  (1.88 m)    GEN- The patient is well appearing, alert and oriented x 3 today.   Head- normocephalic, atraumatic Eyes-  Sclera clear, conjunctiva pink Ears- hearing intact Oropharynx- clear Lungs- Clear to ausculation  bilaterally, normal work of breathing Heart- Regular rate and rhythm, no murmurs, rubs or gallops, PMI not laterally displaced GI- soft, NT, ND, + BS Extremities- no clubbing, cyanosis, or edema  EKG tracing ordered today is personally reviewed and shows sinus rhythm, rsr1,   Assessment and Plan:  1.  VT Doing well s/p ablation, without symptoms of recurrence No changes today Consider stopping diltiazem on return No driving until I see him again  2. 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 HTN Stable No change required today  4. Nonobstructive CAD Mild On statin   Return to see me in 6 weeks  Thompson Grayer MD,  [facility] 09/01/2017 8:47 AM

## 2017-09-01 NOTE — Patient Instructions (Signed)
Medication Instructions:  Your physician recommends that you continue on your current medications as directed. Please refer to the Current Medication list given to you today.   Labwork: None ordered   Testing/Procedures: None ordered   Follow-Up: Your physician recommends that you schedule a follow-up appointment in: 6 weeks with Dr Rayann Heman   Thank you for choosing [facility]!!     Janan Halter, RN [phone]

## 2017-09-05 ENCOUNTER — Encounter: Payer: Self-pay | Admitting: Family Medicine

## 2017-09-05 ENCOUNTER — Ambulatory Visit (INDEPENDENT_AMBULATORY_CARE_PROVIDER_SITE_OTHER): Payer: Medicare Other | Admitting: Family Medicine

## 2017-09-05 VITALS — BP 117/52 | HR 55 | Temp 98.4°F | Wt 216.6 lb

## 2017-09-05 DIAGNOSIS — J069 Acute upper respiratory infection, unspecified: Secondary | ICD-10-CM

## 2017-09-05 MED ORDER — BENZONATATE 200 MG PO CAPS: 200.0000 mg | ORAL_CAPSULE | Freq: Two times a day (BID) | ORAL | 0 refills | Status: DC | PRN

## 2017-09-05 MED ORDER — AZITHROMYCIN 250 MG PO TABS: ORAL_TABLET | ORAL | 0 refills | Status: DC

## 2017-09-05 MED ORDER — HYDROCOD POLST-CPM POLST ER 10-8 MG/5ML PO SUER: 5.0000 mL | Freq: Two times a day (BID) | ORAL | 0 refills | Status: DC | PRN

## 2017-09-05 NOTE — Patient Instructions (Signed)
Follow up as needed

## 2017-09-05 NOTE — Progress Notes (Signed)
   BP (!) 117/52 (BP Location: Right Arm, Patient Position: Sitting, Cuff Size: Normal)   Pulse (!) 55   Temp 98.4 F (36.9 C)   Wt 216 lb 9.6 oz (98.2 kg)   SpO2 94%   BMI 27.81 kg/m    Subjective:    Patient ID: Shawn Shepard, male    DOB: 02/08/1946, 71 y.o.   MRN: [id number]  HPI: Shawn Shepard is a 71 y.o. male  Chief Complaint  Patient presents with  . Cough    Pt says bronchitis. X's 1 week. Worsening. Patient can't sleep and has to lay in a recliner. Productive. Yellow/Green in color.  . Headache  . Nasal Congestion   Patient with 1 week of congestion, productive cough, wheezing, HA. Denies fever, chills, sweats, CP, SOB. Allegra not helping. No sick contacts. States he has a propensity towards bronchitis.   Relevant past medical, surgical, family and social history reviewed and updated as indicated. Interim medical history since our last visit reviewed. Allergies and medications reviewed and updated.  Review of Systems  Constitutional: Negative.   HENT: Positive for congestion.   Respiratory: Positive for cough and wheezing.   Cardiovascular: Negative.   Gastrointestinal: Negative.   Genitourinary: Negative.   Musculoskeletal: Negative.   Neurological: Positive for headaches.  Psychiatric/Behavioral: Negative.    Per HPI unless specifically indicated above     Objective:    BP (!) 117/52 (BP Location: Right Arm, Patient Position: Sitting, Cuff Size: Normal)   Pulse (!) 55   Temp 98.4 F (36.9 C)   Wt 216 lb 9.6 oz (98.2 kg)   SpO2 94%   BMI 27.81 kg/m   Wt Readings from Last 3 Encounters:  09/05/17 216 lb 9.6 oz (98.2 kg)  09/01/17 214 lb 12.8 oz (97.4 kg)  08/11/17 209 lb 14.1 oz (95.2 kg)    Physical Exam  Constitutional: He is oriented to person, place, and time. He appears well-developed and well-nourished. No distress.  HENT:  Head: Atraumatic.  Right Ear: External ear normal.  Left Ear: External ear normal.  Nose: Nose normal.    Mouth/Throat: No oropharyngeal exudate.  Oropharynx injected  Eyes: Pupils are equal, round, and reactive to light. Conjunctivae are normal. No scleral icterus.  Neck: Normal range of motion. Neck supple.  Cardiovascular: Normal rate and normal heart sounds.   Pulmonary/Chest: Effort normal and breath sounds normal. No respiratory distress.  Musculoskeletal: Normal range of motion.  Lymphadenopathy:    He has no cervical adenopathy.  Neurological: He is alert and oriented to person, place, and time.  Skin: Skin is warm and dry. No rash noted.  Psychiatric: He has a normal mood and affect. His behavior is normal.  Nursing note and vitals reviewed.     Assessment & Plan:   Problem List Items Addressed This Visit    None    Visit Diagnoses    Upper respiratory tract infection, unspecified type    -  Primary   Will treat with zpack, tessalon, and tussionex. Can continue OTC medications prn for sxs. Medication precautions and supportive care reviewed at length   Relevant Medications   azithromycin (ZITHROMAX) 250 MG tablet       Follow up plan: Return for as scheduled, lung recheck.

## 2017-09-10 ENCOUNTER — Ambulatory Visit: Payer: Medicare Other | Admitting: Cardiology

## 2017-09-11 ENCOUNTER — Encounter: Payer: Self-pay | Admitting: Family Medicine

## 2017-09-11 ENCOUNTER — Ambulatory Visit (INDEPENDENT_AMBULATORY_CARE_PROVIDER_SITE_OTHER): Payer: Medicare Other

## 2017-09-11 ENCOUNTER — Ambulatory Visit (INDEPENDENT_AMBULATORY_CARE_PROVIDER_SITE_OTHER): Payer: Medicare Other | Admitting: Family Medicine

## 2017-09-11 VITALS — BP 135/80 | HR 64 | Temp 97.5°F | Resp 16 | Ht 73.0 in | Wt 214.2 lb

## 2017-09-11 VITALS — BP 135/80 | HR 64 | Temp 97.5°F | Ht 73.0 in | Wt 214.2 lb

## 2017-09-11 DIAGNOSIS — E785 Hyperlipidemia, unspecified: Secondary | ICD-10-CM

## 2017-09-11 DIAGNOSIS — Z0000 Encounter for general adult medical examination without abnormal findings: Secondary | ICD-10-CM

## 2017-09-11 DIAGNOSIS — R05 Cough: Secondary | ICD-10-CM | POA: Diagnosis not present

## 2017-09-11 DIAGNOSIS — R5383 Other fatigue: Secondary | ICD-10-CM | POA: Diagnosis not present

## 2017-09-11 DIAGNOSIS — I10 Essential (primary) hypertension: Secondary | ICD-10-CM | POA: Diagnosis not present

## 2017-09-11 DIAGNOSIS — Z125 Encounter for screening for malignant neoplasm of prostate: Secondary | ICD-10-CM | POA: Diagnosis not present

## 2017-09-11 DIAGNOSIS — R059 Cough, unspecified: Secondary | ICD-10-CM

## 2017-09-11 MED ORDER — FLECAINIDE ACETATE 100 MG PO TABS: 200.0000 mg | ORAL_TABLET | Freq: Once | ORAL | 0 refills | Status: DC

## 2017-09-11 NOTE — Patient Instructions (Signed)
Shawn Shepard , Thank you for taking time to come for your Medicare Wellness Visit. I appreciate your ongoing commitment to your health goals. Please review the following plan we discussed and let me know if I can assist you in the future.   Screening recommendations/referrals: Colonoscopy: completed 02/14/2009 Recommended yearly ophthalmology/optometry visit for glaucoma screening and checkup Recommended yearly dental visit for hygiene and checkup  Vaccinations: Influenza vaccine: up to date Pneumococcal vaccine: up to date Tdap vaccine: up to date Shingles vaccine: up to date    Advanced directives: Advance directive discussed with you today. I have provided a copy for you to complete at home and have notarized. Once this is complete please bring a copy in to our office so we can scan it into your chart.  Conditions/risks identified: none   Next appointment: Follow up on 09/16/2017 at 9:00am with Dr.Crissman. Follow up in one year for your annual wellness exam.   Preventive Care 70 Years and Older, Male Preventive care refers to lifestyle choices and visits with your health care provider that can promote health and wellness. What does preventive care include?  A yearly physical exam. This is also called an annual well check.  Dental exams once or twice a year.  Routine eye exams. Ask your health care provider how often you should have your eyes checked.  Personal lifestyle choices, including:  Daily care of your teeth and gums.  Regular physical activity.  Eating a healthy diet.  Avoiding tobacco and drug use.  Limiting alcohol use.  Practicing safe sex.  Taking low doses of aspirin every day.  Taking vitamin and mineral supplements as recommended by your health care provider. What happens during an annual well check? The services and screenings done by your health care provider during your annual well check will depend on your age, overall health, lifestyle risk  factors, and family history of disease. Counseling  Your health care provider may ask you questions about your:  Alcohol use.  Tobacco use.  Drug use.  Emotional well-being.  Home and relationship well-being.  Sexual activity.  Eating habits.  History of falls.  Memory and ability to understand (cognition).  Work and work Statistician. Screening  You may have the following tests or measurements:  Height, weight, and BMI.  Blood pressure.  Lipid and cholesterol levels. These may be checked every 5 years, or more frequently if you are over 67 years old.  Skin check.  Lung cancer screening. You may have this screening every year starting at age 70 if you have a 30-pack-year history of smoking and currently smoke or have quit within the past 15 years.  Fecal occult blood test (FOBT) of the stool. You may have this test every year starting at age 30.  Flexible sigmoidoscopy or colonoscopy. You may have a sigmoidoscopy every 5 years or a colonoscopy every 10 years starting at age 70.  Prostate cancer screening. Recommendations will vary depending on your family history and other risks.  Hepatitis C blood test.  Hepatitis B blood test.  Sexually transmitted disease (STD) testing.  Diabetes screening. This is done by checking your blood sugar (glucose) after you have not eaten for a while (fasting). You may have this done every 1-3 years.  Abdominal aortic aneurysm (AAA) screening. You may need this if you are a current or former smoker.  Osteoporosis. You may be screened starting at age 8 if you are at high risk. Talk with your health care provider about your test  results, treatment options, and if necessary, the need for more tests. Vaccines  Your health care provider may recommend certain vaccines, such as:  Influenza vaccine. This is recommended every year.  Tetanus, diphtheria, and acellular pertussis (Tdap, Td) vaccine. You may need a Td booster every 10  years.  Zoster vaccine. You may need this after age 70.  Pneumococcal 13-valent conjugate (PCV13) vaccine. One dose is recommended after age 70.  Pneumococcal polysaccharide (PPSV23) vaccine. One dose is recommended after age 70. Talk to your health care provider about which screenings and vaccines you need and how often you need them. This information is not intended to replace advice given to you by your health care provider. Make sure you discuss any questions you have with your health care provider. Document Released: 12/15/2015 Document Revised: 08/07/2016 Document Reviewed: 09/19/2015 Elsevier Interactive Patient Education  2017 [place] Prevention in the Home Falls can cause injuries. They can happen to people of all ages. There are many things you can do to make your home safe and to help prevent falls. What can I do on the outside of my home?  Regularly fix the edges of walkways and driveways and fix any cracks.  Remove anything that might make you trip as you walk through a door, such as a raised step or threshold.  Trim any bushes or trees on the path to your home.  Use bright outdoor lighting.  Clear any walking paths of anything that might make someone trip, such as rocks or tools.  Regularly check to see if handrails are loose or broken. Make sure that both sides of any steps have handrails.  Any raised decks and porches should have guardrails on the edges.  Have any leaves, snow, or ice cleared regularly.  Use sand or salt on walking paths during winter.  Clean up any spills in your garage right away. This includes oil or grease spills. What can I do in the bathroom?  Use night lights.  Install grab bars by the toilet and in the tub and shower. Do not use towel bars as grab bars.  Use non-skid mats or decals in the tub or shower.  If you need to sit down in the shower, use a plastic, non-slip stool.  Keep the floor dry. Clean up any water that  spills on the floor as soon as it happens.  Remove soap buildup in the tub or shower regularly.  Attach bath mats securely with double-sided non-slip rug tape.  Do not have throw rugs and other things on the floor that can make you trip. What can I do in the bedroom?  Use night lights.  Make sure that you have a light by your bed that is easy to reach.  Do not use any sheets or blankets that are too big for your bed. They should not hang down onto the floor.  Have a firm chair that has side arms. You can use this for support while you get dressed.  Do not have throw rugs and other things on the floor that can make you trip. What can I do in the kitchen?  Clean up any spills right away.  Avoid walking on wet floors.  Keep items that you use a lot in easy-to-reach places.  If you need to reach something above you, use a strong step stool that has a grab bar.  Keep electrical cords out of the way.  Do not use floor polish or wax that makes  floors slippery. If you must use wax, use non-skid floor wax.  Do not have throw rugs and other things on the floor that can make you trip. What can I do with my stairs?  Do not leave any items on the stairs.  Make sure that there are handrails on both sides of the stairs and use them. Fix handrails that are broken or loose. Make sure that handrails are as long as the stairways.  Check any carpeting to make sure that it is firmly attached to the stairs. Fix any carpet that is loose or worn.  Avoid having throw rugs at the top or bottom of the stairs. If you do have throw rugs, attach them to the floor with carpet tape.  Make sure that you have a light switch at the top of the stairs and the bottom of the stairs. If you do not have them, ask someone to add them for you. What else can I do to help prevent falls?  Wear shoes that:  Do not have high heels.  Have rubber bottoms.  Are comfortable and fit you well.  Are closed at the  toe. Do not wear sandals.  If you use a stepladder:  Make sure that it is fully opened. Do not climb a closed stepladder.  Make sure that both sides of the stepladder are locked into place.  Ask someone to hold it for you, if possible.  Clearly mark and make sure that you can see:  Any grab bars or handrails.  First and last steps.  Where the edge of each step is.  Use tools that help you move around (mobility aids) if they are needed. These include:  Canes.  Walkers.  Scooters.  Crutches.  Turn on the lights when you go into a dark area. Replace any light bulbs as soon as they burn out.  Set up your furniture so you have a clear path. Avoid moving your furniture around.  If any of your floors are uneven, fix them.  If there are any pets around you, be aware of where they are.  Review your medicines with your doctor. Some medicines can make you feel dizzy. This can increase your chance of falling. Ask your doctor what other things that you can do to help prevent falls. This information is not intended to replace advice given to you by your health care provider. Make sure you discuss any questions you have with your health care provider. Document Released: 09/14/2009 Document Revised: 04/25/2016 Document Reviewed: 12/23/2014 Elsevier Interactive Patient Education  2017 [employer].

## 2017-09-11 NOTE — Progress Notes (Signed)
Subjective:   Shawn Shepard is a 71 y.o. male who presents for Medicare Annual/Subsequent preventive examination.  Review of Systems:  Cardiac Risk Factors include: male gender;advanced age (>32men, >40 women);dyslipidemia;hypertension     Objective:    Vitals: BP 135/80 (BP Location: Left Arm, Patient Position: Sitting)   Pulse 64   Temp (!) 97.5 F (36.4 C) (Oral)   Resp 16   Ht 6\' 1"  (1.854 m)   Wt 214 lb 3.2 oz (97.2 kg)   BMI 28.26 kg/m   Body mass index is 28.26 kg/m.  Tobacco History  Smoking Status  . Former Smoker  . Types: Cigarettes  . Quit date: 06/21/1971  Smokeless Tobacco  . Former Systems developer  . Types: Chew  . Quit date: 06/20/1980     Counseling given: Not Answered   Past Medical History:  Diagnosis Date  . Basal cell carcinoma   . CAD (coronary artery disease)   . Hyperlipidemia   . Hypertension   . Shingles   . Ventricular tachycardia [facility])    Past Surgical History:  Procedure Laterality Date  . CARDIAC CATHETERIZATION N/A 01/16/2016   Procedure: Left Heart Cath and Coronary Angiography;  Surgeon: [name], MD;  Location: [place] [facility];  Service: Cardiovascular;  Laterality: N/A;  . CORONARY ANGIOPLASTY  2000 and 2003  . GANGLION CYST EXCISION  1979  . LEFT HEART CATH Right 08/07/2017   Procedure: Left Heart Cath;  Surgeon: [name], MD;  Location: [place] [facility];  Service: Cardiovascular;  Laterality: Right;  . PILONIDAL CYST EXCISION  1977  . SKIN CANCER EXCISION  1999   ear  . V TACH ABLATION N/A 08/14/2017   Procedure: V Tach Ablation;  Surgeon: [name], MD;  Location: [place] [facility];  Service: Cardiovascular;  Laterality: N/A;  . VEIN SURGERY  2000   laser   Family History  Problem Relation Age of Onset  . Hypertension Mother   . Heart disease Mother   . Heart attack Mother   . Diabetes Mother   . Alzheimer's disease Father   . Congestive Heart Failure Brother    History  Sexual Activity    . Sexual activity: Not on file    Outpatient Encounter Prescriptions as of 09/11/2017  Medication Sig  . aspirin EC 81 MG tablet Take 81 mg by mouth daily.  Marland Kitchen atorvastatin (LIPITOR) 80 MG tablet Take 1 tablet (80 mg total) by mouth at bedtime.  Marland Kitchen azithromycin (ZITHROMAX) 250 MG tablet Take 2 tabs day one, then 1 tab daily until complete  . benzonatate (TESSALON) 200 MG capsule Take 1 capsule (200 mg total) by mouth 2 (two) times daily as needed for cough.  Marland Kitchen CALCIUM PO Take 600 mg by mouth daily.  . chlorpheniramine-HYDROcodone (TUSSIONEX PENNKINETIC ER) 10-8 MG/5ML SUER Take 5 mLs by mouth every 12 (twelve) hours as needed for cough.  . diltiazem (CARDIZEM CD) 180 MG 24 hr capsule Take 180 mg by mouth daily.  . fexofenadine (ALLEGRA) 180 MG tablet Take 1 tablet (180 mg total) by mouth daily.  . metoprolol succinate (TOPROL-XL) 25 MG 24 hr tablet Take 25 mg by mouth daily.  . niacin (NIASPAN) 1000 MG CR tablet Take 1 tablet (1,000 mg total) by mouth at bedtime.  . pantoprazole (PROTONIX) 40 MG tablet take 1 tablet by mouth once daily (Patient taking differently: Take 40 mg by mouth daily)  . ramipril (ALTACE) 5 MG capsule Take 1 capsule (5  mg total) by mouth daily.  . flecainide (TAMBOCOR) 100 MG tablet Take 2 tablets (200 mg total) by mouth once. As needed for recurrent tachycardia.  Do not repeat more than once per day or 3 times per week   No facility-administered encounter medications on file as of 09/11/2017.     Activities of Daily Living In your present state of health, do you have any difficulty performing the following activities: 09/11/2017 08/11/2017  Hearing? N N  Vision? N N  Difficulty concentrating or making decisions? N N  Walking or climbing stairs? N N  Dressing or bathing? N N  Doing errands, shopping? N N  Preparing Food and eating ? N -  Using the Toilet? N -  In the past six months, have you accidently leaked urine? N -  Do you have problems with loss of bowel  control? N -  Managing your Medications? N -  Managing your Finances? N -  Housekeeping or managing your Housekeeping? N -  Some recent data might be hidden    Patient Care Team: [name], MD as PCP - General (Family Medicine) [name], MD as Consulting Physician (Cardiology)   Assessment:     Exercise Activities and Dietary recommendations Current Exercise Habits: Home exercise routine, Type of exercise: walking, Time (Minutes): 30, Frequency (Times/Week): 7, Weekly Exercise (Minutes/Week): 210, Intensity: Mild, Exercise limited by: None identified  Goals    None     Fall Risk Fall Risk  09/11/2017 03/04/2017 09/10/2016 06/21/2015  Falls in the past year? No No No No   Depression Screen PHQ 2/9 Scores 09/11/2017 06/23/2017 09/10/2016 06/21/2015  PHQ - 2 Score 0 0 0 0    Cognitive Function     6CIT Screen 09/11/2017  What Year? 0 points  What month? 0 points  What time? 0 points  Count back from 20 0 points  Months in reverse 0 points  Repeat phrase 0 points  Total Score 0    Immunization History  Administered Date(s) Administered  . Influenza-Unspecified 09/27/2015, 08/21/2016, 08/26/2017  . Pneumococcal Conjugate-13 12/08/2014  . Pneumococcal Polysaccharide-23 09/10/2016  . Td 12/21/2007  . Zoster 12/22/2008   Screening Tests Health Maintenance  Topic Date Due  . TETANUS/TDAP  12/20/2017  . COLONOSCOPY  02/15/2019  . INFLUENZA VACCINE  Completed  . Hepatitis C Screening  Completed  . PNA vac Low Risk Adult  Completed      Plan:    I have personally reviewed and addressed the Medicare Annual Wellness questionnaire and have noted the following in the patient's chart:  A. Medical and social history B. Use of alcohol, tobacco or illicit drugs  C. Current medications and supplements D. Functional ability and status E.  Nutritional status F.  Physical activity G. Advance directives H. List of other physicians I.  Hospitalizations,  surgeries, and ER visits in previous 12 months J.  [place] such as hearing and vision if needed, cognitive and depression L. Referrals and appointments   In addition, I have reviewed and discussed with patient certain preventive protocols, quality metrics, and best practice recommendations. A written personalized care plan for preventive services as well as general preventive health recommendations were provided to patient.   Signed,  Tyler Aas, LPN Nurse Health Advisor   MD Recommendations: none

## 2017-09-11 NOTE — Progress Notes (Signed)
   BP 135/80 (BP Location: Left Arm, Patient Position: Sitting, Cuff Size: Normal)   Pulse 64   Temp (!) 97.5 F (36.4 C)   Ht 6\' 1"  (1.854 m)   Wt 214 lb 3.2 oz (97.2 kg)   SpO2 93%   BMI 28.26 kg/m    Subjective:    Patient ID: Shawn Shepard, male    DOB: 1946/11/10, 71 y.o.   MRN: [id number]  HPI: SHIVANK PINEDO is a 71 y.o. male  Chief Complaint  Patient presents with  . Cough   Patient presents following up for URI for which he was seen last week. Was treated with zpack, tessalon, tussionex and has had good improvement other than lingering cough and mild chest tightness. States when he gets bronchitis it tends to linger for months. No SOB, fevers, facial pain, congestion.   Relevant past medical, surgical, family and social history reviewed and updated as indicated. Interim medical history since our last visit reviewed. Allergies and medications reviewed and updated.  Review of Systems  Constitutional: Negative.   HENT: Negative.   Respiratory: Positive for cough and chest tightness.   Cardiovascular: Negative.   Gastrointestinal: Negative.   Genitourinary: Negative.   Musculoskeletal: Negative.   Neurological: Negative.   Psychiatric/Behavioral: Negative.    Per HPI unless specifically indicated above     Objective:    BP 135/80 (BP Location: Left Arm, Patient Position: Sitting, Cuff Size: Normal)   Pulse 64   Temp (!) 97.5 F (36.4 C)   Ht 6\' 1"  (1.854 m)   Wt 214 lb 3.2 oz (97.2 kg)   SpO2 93%   BMI 28.26 kg/m   Wt Readings from Last 3 Encounters:  09/11/17 214 lb 3.2 oz (97.2 kg)  09/11/17 214 lb 3.2 oz (97.2 kg)  09/05/17 216 lb 9.6 oz (98.2 kg)    Physical Exam  Constitutional: He is oriented to person, place, and time. He appears well-developed and well-nourished. No distress.  HENT:  Head: Atraumatic.  Eyes: Pupils are equal, round, and reactive to light. Conjunctivae are normal. No scleral icterus.  Neck: Normal range of motion. Neck  supple.  Cardiovascular: Normal rate.   Pulmonary/Chest: Breath sounds normal. No respiratory distress. He has no wheezes. He has no rales.  Musculoskeletal: Normal range of motion.  Neurological: He is alert and oriented to person, place, and time.  Skin: Skin is warm and dry.  Psychiatric: He has a normal mood and affect. His behavior is normal.  Nursing note and vitals reviewed.     Assessment & Plan:   Problem List Items Addressed This Visit    None    Visit Diagnoses    Cough    -  Primary   URI improved with zpack and cough medications, will add symbicort BID until cough subsides. Samples given today with instructions for use.        Follow up plan: Return for as scheduled.

## 2017-09-11 NOTE — Patient Instructions (Signed)
Follow up as scheduled.  

## 2017-09-12 ENCOUNTER — Ambulatory Visit: Payer: Medicare Other | Admitting: Internal Medicine

## 2017-09-12 LAB — COMPREHENSIVE METABOLIC PANEL
ALT: 10 IU/L (ref 0–44)
AST: 19 IU/L (ref 0–40)
Albumin/Globulin Ratio: 1.5 (ref 1.2–2.2)
Albumin: 3.9 g/dL (ref 3.6–4.8)
Alkaline Phosphatase: 92 IU/L (ref 39–117)
BUN/Creatinine Ratio: 10 (ref 10–24)
BUN: 10 mg/dL (ref 8–27)
Bilirubin Total: 0.4 mg/dL (ref 0.0–1.2)
CO2: 17 mmol/L — ABNORMAL LOW (ref 20–29)
Calcium: 10.3 mg/dL — ABNORMAL HIGH (ref 8.6–10.2)
Chloride: 108 mmol/L — ABNORMAL HIGH (ref 96–106)
Creatinine, Ser: 0.98 mg/dL (ref 0.76–1.27)
GFR calc Af Amer: 91 mL/min/{1.73_m2} (ref >59)
GFR calc non Af Amer: 78 mL/min/{1.73_m2} (ref >59)
Globulin, Total: 2.6 g/dL (ref 1.5–4.5)
Glucose: 92 mg/dL (ref 65–99)
Potassium: 4.8 mmol/L (ref 3.5–5.2)
Sodium: 142 mmol/L (ref 134–144)
Total Protein: 6.5 g/dL (ref 6.0–8.5)

## 2017-09-12 LAB — LIPID PANEL
CHOL/HDL RATIO: 2.3 ratio (ref 0.0–5.0)
CHOLESTEROL TOTAL: 126 mg/dL (ref 100–199)
HDL: 54 mg/dL (ref >39)
LDL Calculated: 59 mg/dL (ref 0–99)
TRIGLYCERIDES: 67 mg/dL (ref 0–149)
VLDL Cholesterol Cal: 13 mg/dL (ref 5–40)

## 2017-09-12 LAB — TSH: TSH: 2.18 u[IU]/mL (ref 0.450–4.500)

## 2017-09-12 LAB — CBC
HEMATOCRIT: 39.4 % (ref 37.5–51.0)
HEMOGLOBIN: 13 g/dL (ref 13.0–17.7)
MCH: 31 pg (ref 26.6–33.0)
MCHC: 33 g/dL (ref 31.5–35.7)
MCV: 94 fL (ref 79–97)
Platelets: 189 10*3/uL (ref 150–379)
RBC: 4.19 x10E6/uL (ref 4.14–5.80)
RDW: 13.9 % (ref 12.3–15.4)
WBC: 4.6 10*3/uL (ref 3.4–10.8)

## 2017-09-12 LAB — PSA: Prostate Specific Ag, Serum: 0.4 ng/mL (ref 0.0–4.0)

## 2017-09-16 ENCOUNTER — Encounter: Payer: Self-pay | Admitting: Family Medicine

## 2017-09-24 ENCOUNTER — Ambulatory Visit: Admit: 2017-09-24 | Payer: Medicare Other | Admitting: Orthopedic Surgery

## 2017-09-24 SURGERY — ARTHROSCOPY, KNEE
Anesthesia: Choice | Laterality: Left

## 2017-09-29 ENCOUNTER — Encounter: Payer: Self-pay | Admitting: *Deleted

## 2017-10-13 ENCOUNTER — Ambulatory Visit: Payer: Medicare Other | Admitting: Internal Medicine

## 2017-10-13 ENCOUNTER — Encounter: Payer: Self-pay | Admitting: Internal Medicine

## 2017-10-13 VITALS — BP 110/64 | HR 65 | Ht 73.0 in | Wt 217.0 lb

## 2017-10-13 DIAGNOSIS — I428 Other cardiomyopathies: Secondary | ICD-10-CM

## 2017-10-13 DIAGNOSIS — I10 Essential (primary) hypertension: Secondary | ICD-10-CM | POA: Diagnosis not present

## 2017-10-13 DIAGNOSIS — I472 Ventricular tachycardia, unspecified: Secondary | ICD-10-CM

## 2017-10-13 MED ORDER — FLECAINIDE ACETATE 100 MG PO TABS: 200.0000 mg | ORAL_TABLET | Freq: Once | ORAL | 3 refills | Status: DC

## 2017-10-13 MED ORDER — FLECAINIDE ACETATE 100 MG PO TABS: 200.0000 mg | ORAL_TABLET | ORAL | 3 refills | Status: DC | PRN

## 2017-10-13 NOTE — Progress Notes (Signed)
PCP: [name], MD Primary Cardiologist: Kerin Ransom EP: Dr Neale Burly is a 71 y.o. male who presents today for routine electrophysiology followup.  Since last being seen in our clinic, the patient reports doing very well.  His primary concern is with L knee pain.  He is planning to have surgery soon.  Today, he denies symptoms of palpitations, chest pain, shortness of breath,  lower extremity edema, dizziness, presyncope, or syncope.  The patient is otherwise without complaint today.   Past Medical History:  Diagnosis Date  . Basal cell carcinoma   . CAD (coronary artery disease)   . Hyperlipidemia   . Hypertension   . Shingles   . Ventricular tachycardia [facility])    Past Surgical History:  Procedure Laterality Date  . CORONARY ANGIOPLASTY  2000 and 2003  . GANGLION CYST EXCISION  1979  . PILONIDAL CYST EXCISION  1977  . SKIN CANCER EXCISION  1999   ear  . VEIN SURGERY  2000   laser    ROS- all systems are reviewed and negatives except as per HPI above  Current Outpatient Medications  Medication Sig Dispense Refill  . aspirin EC 81 MG tablet Take 81 mg by mouth daily.    Marland Kitchen atorvastatin (LIPITOR) 80 MG tablet Take 1 tablet (80 mg total) by mouth at bedtime. 90 tablet 4  . CALCIUM PO Take 600 mg by mouth daily.    Marland Kitchen diltiazem (CARDIZEM CD) 180 MG 24 hr capsule Take 180 mg by mouth daily.  0  . fexofenadine (ALLEGRA) 180 MG tablet Take 1 tablet (180 mg total) by mouth daily. 30 tablet 12  . flecainide (TAMBOCOR) 100 MG tablet Take 2 tablets (200 mg total) by mouth once. As needed for recurrent tachycardia.  Do not repeat more than once per day or 3 times per week 2 tablet 0  . metoprolol succinate (TOPROL-XL) 25 MG 24 hr tablet Take 25 mg by mouth daily.    . niacin (NIASPAN) 1000 MG CR tablet Take 1 tablet (1,000 mg total) by mouth at bedtime. 90 tablet 4  . pantoprazole (PROTONIX) 40 MG tablet take 1 tablet by mouth once daily (Patient taking differently:  Take 40 mg by mouth daily) 30 tablet 3  . ramipril (ALTACE) 5 MG capsule Take 1 capsule (5 mg total) by mouth daily. 30 capsule 0   No current facility-administered medications for this visit.     Physical Exam: Vitals:   10/13/17 0835  BP: 110/64  Pulse: 65  SpO2: 97%  Weight: 217 lb (98.4 kg)  Height: 6\' 1"  (1.854 m)    GEN- The patient is well appearing, alert and oriented x 3 today.   Head- normocephalic, atraumatic Eyes-  Sclera clear, conjunctiva pink Ears- hearing intact Oropharynx- clear Lungs- Clear to ausculation bilaterally, normal work of breathing Heart- Regular rate and rhythm, no murmurs, rubs or gallops, PMI not laterally displaced GI- soft, NT, ND, + BS Extremities- no clubbing, cyanosis, or edema  EKG tracing ordered today is personally reviewed and shows sinus rhythm with occasional PVCs  Assessment and Plan:  1. 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 HTN Stable No change required today  4. Mild nonobstructive CAD On statin  5. preop Planning to have knee surgery soon OK to proceed without further CV testing from my standpoint Pt reports that Dr  Humphrey Rolls is considering myoview.  Given cath 08/07/17, I am not sure that myoview would offer additional value at this time.  I do think that follow-up echo could be beneficial.  Follow-up with me in 6 months  Thompson Grayer MD, [facility] 10/13/2017 8:53 AM

## 2017-10-13 NOTE — Patient Instructions (Signed)
Medication Instructions:  Your physician has recommended you make the following change in your medication:   1.) STOP Diiltiazem  -- If you need a refill on your cardiac medications before your next appointment, please call your pharmacy. --  Labwork: None ordered  Testing/Procedures: None ordered  Follow-Up: Your physician wants you to follow-up in: 6 months with Dr. Rayann Heman.  You will receive a reminder letter in the mail two months in advance. If you don't receive a letter, please call our office to schedule the follow-up appointment.  Thank you for choosing [facility]!!   Frederik Schmidt, RN [phone]  Any Other Special Instructions Will Be Listed Below (If Applicable).

## 2017-10-16 ENCOUNTER — Encounter: Payer: Self-pay | Admitting: Family Medicine

## 2017-10-17 ENCOUNTER — Ambulatory Visit: Payer: Medicare Other | Admitting: Family Medicine

## 2017-10-17 ENCOUNTER — Encounter: Payer: Self-pay | Admitting: Family Medicine

## 2017-10-17 VITALS — BP 115/67 | HR 62 | Temp 98.5°F | Ht 73.25 in | Wt 215.3 lb

## 2017-10-17 DIAGNOSIS — I10 Essential (primary) hypertension: Secondary | ICD-10-CM | POA: Diagnosis not present

## 2017-10-17 DIAGNOSIS — M1710 Unilateral primary osteoarthritis, unspecified knee: Secondary | ICD-10-CM

## 2017-10-17 DIAGNOSIS — I472 Ventricular tachycardia, unspecified: Secondary | ICD-10-CM

## 2017-10-17 DIAGNOSIS — E7800 Pure hypercholesterolemia, unspecified: Secondary | ICD-10-CM

## 2017-10-17 DIAGNOSIS — I2583 Coronary atherosclerosis due to lipid rich plaque: Secondary | ICD-10-CM

## 2017-10-17 DIAGNOSIS — I2510 Atherosclerotic heart disease of native coronary artery without angina pectoris: Secondary | ICD-10-CM | POA: Diagnosis not present

## 2017-10-17 DIAGNOSIS — Z0001 Encounter for general adult medical examination with abnormal findings: Secondary | ICD-10-CM

## 2017-10-17 DIAGNOSIS — Z0000 Encounter for general adult medical examination without abnormal findings: Secondary | ICD-10-CM

## 2017-10-17 DIAGNOSIS — Z7189 Other specified counseling: Secondary | ICD-10-CM | POA: Insufficient documentation

## 2017-10-17 MED ORDER — ATORVASTATIN CALCIUM 80 MG PO TABS: 80.0000 mg | ORAL_TABLET | Freq: Every day | ORAL | 4 refills | Status: DC

## 2017-10-17 MED ORDER — NIACIN ER (ANTIHYPERLIPIDEMIC) 1000 MG PO TBCR: 1000.0000 mg | EXTENDED_RELEASE_TABLET | Freq: Every day | ORAL | 4 refills | Status: DC

## 2017-10-17 NOTE — Assessment & Plan Note (Signed)
A voluntary discussion about advance care planning including the explanation and discussion of advance directives was extensively discussed  with the patient.  Explanation about the health care proxy and Living will was reviewed and packet with forms with explanation of how to fill them out was given.    

## 2017-10-17 NOTE — Progress Notes (Signed)
BP 115/67 (BP Location: Left Arm, Patient Position: Sitting, Cuff Size: Normal)   Pulse 62   Temp 98.5 F (36.9 C) (Temporal)   Ht 6' 1.25" (1.861 m)   Wt 215 lb 4.8 oz (97.7 kg)   SpO2 97%   BMI 28.21 kg/m    Subjective:    Patient ID: Shawn Shepard, male    DOB: 1946/01/12, 71 y.o.   MRN: [id number]  HPI: Shawn Shepard is a 71 y.o. male  Chief Complaint  Patient presents with  . Annual Exam  patient all in all with a stormy fall. With  ventricular tachycardia and ablation of aberrant pathway with apparent cure.Patient with no further episodes. Had to put off meniscal tear surgery of his knee which will probably be done in early January. Reviewed medicine list and only would use flecainide and metoprolol for when necessary use with ventricular tachycardia.  Relevant past medical, surgical, family and social history reviewed and updated as indicated. Interim medical history since our last visit reviewed. Allergies and medications reviewed and updated.  Review of Systems  Constitutional: Negative.   HENT: Negative.   Eyes: Negative.   Respiratory: Negative.   Cardiovascular: Negative.   Gastrointestinal: Negative.   Endocrine: Negative.   Genitourinary: Negative.   Musculoskeletal: Negative.   Skin: Negative.   Allergic/Immunologic: Negative.   Neurological: Negative.   Hematological: Negative.   Psychiatric/Behavioral: Negative.     Per HPI unless specifically indicated above     Objective:    BP 115/67 (BP Location: Left Arm, Patient Position: Sitting, Cuff Size: Normal)   Pulse 62   Temp 98.5 F (36.9 C) (Temporal)   Ht 6' 1.25" (1.861 m)   Wt 215 lb 4.8 oz (97.7 kg)   SpO2 97%   BMI 28.21 kg/m   Wt Readings from Last 3 Encounters:  10/17/17 215 lb 4.8 oz (97.7 kg)  10/13/17 217 lb (98.4 kg)  09/11/17 214 lb 3.2 oz (97.2 kg)    Physical Exam  Constitutional: He is oriented to person, place, and time. He appears well-developed and  well-nourished.  HENT:  Head: Normocephalic and atraumatic.  Right Ear: External ear normal.  Left Ear: External ear normal.  Eyes: Conjunctivae and EOM are normal. Pupils are equal, round, and reactive to light.  Neck: Normal range of motion. Neck supple.  Cardiovascular: Normal rate, regular rhythm, normal heart sounds and intact distal pulses.  Pulmonary/Chest: Effort normal and breath sounds normal.  Abdominal: Soft. Bowel sounds are normal. There is no splenomegaly or hepatomegaly.  Genitourinary: Rectum normal and penis normal.  Genitourinary Comments: BPH changes  Musculoskeletal: Normal range of motion.  Neurological: He is alert and oriented to person, place, and time. He has normal reflexes.  Skin: No rash noted. No erythema.  Psychiatric: He has a normal mood and affect. His behavior is normal. Judgment and thought content normal.    Results for orders placed or performed in visit on 09/11/17  Lipid panel  Result Value Ref Range   Cholesterol, Total 126 100 - 199 mg/dL   Triglycerides 67 0 - 149 mg/dL   HDL 54 >39 mg/dL   VLDL Cholesterol Cal 13 5 - 40 mg/dL   LDL Calculated 59 0 - 99 mg/dL   Chol/HDL Ratio 2.3 0.0 - 5.0 ratio  PSA  Result Value Ref Range   Prostate Specific Ag, Serum 0.4 0.0 - 4.0 ng/mL  CBC  Result Value Ref Range   WBC 4.6 3.4 - 10.8 x10E3/uL  RBC 4.19 4.14 - 5.80 x10E6/uL   Hemoglobin 13.0 13.0 - 17.7 g/dL   Hematocrit 39.4 37.5 - 51.0 %   MCV 94 79 - 97 fL   MCH 31.0 26.6 - 33.0 pg   MCHC 33.0 31.5 - 35.7 g/dL   RDW 13.9 12.3 - 15.4 %   Platelets 189 150 - 379 x10E3/uL  Comp Met (CMET)  Result Value Ref Range   Glucose 92 65 - 99 mg/dL   BUN 10 8 - 27 mg/dL   Creatinine, Ser 0.98 0.76 - 1.27 mg/dL   GFR calc non Af Amer 78 >59 mL/min/1.73   GFR calc Af Amer 91 >59 mL/min/1.73   BUN/Creatinine Ratio 10 10 - 24   Sodium 142 134 - 144 mmol/L   Potassium 4.8 3.5 - 5.2 mmol/L   Chloride 108 (H) 96 - 106 mmol/L   CO2 17 (L) 20 - 29  mmol/L   Calcium 10.3 (H) 8.6 - 10.2 mg/dL   Total Protein 6.5 6.0 - 8.5 g/dL   Albumin 3.9 3.6 - 4.8 g/dL   Globulin, Total 2.6 1.5 - 4.5 g/dL   Albumin/Globulin Ratio 1.5 1.2 - 2.2   Bilirubin Total 0.4 0.0 - 1.2 mg/dL   Alkaline Phosphatase 92 39 - 117 IU/L   AST 19 0 - 40 IU/L   ALT 10 0 - 44 IU/L  TSH  Result Value Ref Range   TSH 2.180 0.450 - 4.500 uIU/mL      Assessment & Plan:   Problem List Items Addressed This Visit      Cardiovascular and Mediastinum   CAD (coronary artery disease)    The current medical regimen is effective;  continue present plan and medications.       Relevant Medications   atorvastatin (LIPITOR) 80 MG tablet   niacin (NIASPAN) 1000 MG CR tablet   Hypertension    The current medical regimen is effective;  continue present plan and medications.       Relevant Medications   atorvastatin (LIPITOR) 80 MG tablet   niacin (NIASPAN) 1000 MG CR tablet   Ventricular tachycardia (HCC)    The current medical regimen is effective;  continue present plan and medications.       Relevant Medications   atorvastatin (LIPITOR) 80 MG tablet   niacin (NIASPAN) 1000 MG CR tablet     Musculoskeletal and Integument   Arthritis of knee    Surgery pending        Other   Hyperlipidemia    The current medical regimen is effective;  continue present plan and medications.       Relevant Medications   atorvastatin (LIPITOR) 80 MG tablet   niacin (NIASPAN) 1000 MG CR tablet   Advanced care planning/counseling discussion - Primary    A voluntary discussion about advance care planning including the explanation and discussion of advance directives was extensively discussed  with the patient.  Explanation about the health care proxy and Living will was reviewed and packet with forms with explanation of how to fill them out was given.        Other Visit Diagnoses    PE (physical exam), annual           Follow up plan: Return in about 6 months (around  04/16/2018) for BMP,  Lipids, ALT, AST.

## 2017-10-17 NOTE — Assessment & Plan Note (Signed)
The current medical regimen is effective;  continue present plan and medications.  

## 2017-10-17 NOTE — Assessment & Plan Note (Signed)
Surgery pending

## 2017-11-06 ENCOUNTER — Other Ambulatory Visit: Payer: Self-pay

## 2017-11-06 MED ORDER — PANTOPRAZOLE SODIUM 40 MG PO TBEC: 40.0000 mg | DELAYED_RELEASE_TABLET | Freq: Every day | ORAL | 10 refills | Status: DC

## 2017-11-12 ENCOUNTER — Ambulatory Visit: Payer: Self-pay | Admitting: Orthopedic Surgery

## 2017-11-18 DIAGNOSIS — S83209A Unspecified tear of unspecified meniscus, current injury, unspecified knee, initial encounter: Secondary | ICD-10-CM | POA: Insufficient documentation

## 2017-11-21 ENCOUNTER — Other Ambulatory Visit: Payer: Self-pay

## 2017-11-21 ENCOUNTER — Inpatient Hospital Stay: Admission: RE | Admit: 2017-11-21 | Payer: Medicare Other | Source: Ambulatory Visit

## 2017-11-21 ENCOUNTER — Encounter
Admission: RE | Admit: 2017-11-21 | Discharge: 2017-11-21 | Disposition: A | Discharge status: Home / Self Care | Payer: Medicare Other | Source: Ambulatory Visit | Attending: Orthopedic Surgery | Admitting: Orthopedic Surgery

## 2017-11-21 DIAGNOSIS — Z823 Family history of stroke: Secondary | ICD-10-CM | POA: Insufficient documentation

## 2017-11-21 DIAGNOSIS — X58XXXD Exposure to other specified factors, subsequent encounter: Secondary | ICD-10-CM | POA: Insufficient documentation

## 2017-11-21 DIAGNOSIS — Z79899 Other long term (current) drug therapy: Secondary | ICD-10-CM | POA: Insufficient documentation

## 2017-11-21 DIAGNOSIS — Z8249 Family history of ischemic heart disease and other diseases of the circulatory system: Secondary | ICD-10-CM | POA: Diagnosis not present

## 2017-11-21 DIAGNOSIS — S83221D Peripheral tear of medial meniscus, current injury, right knee, subsequent encounter: Secondary | ICD-10-CM | POA: Insufficient documentation

## 2017-11-21 DIAGNOSIS — Z9889 Other specified postprocedural states: Secondary | ICD-10-CM | POA: Diagnosis not present

## 2017-11-21 DIAGNOSIS — Z01812 Encounter for preprocedural laboratory examination: Secondary | ICD-10-CM | POA: Insufficient documentation

## 2017-11-21 DIAGNOSIS — Z79891 Long term (current) use of opiate analgesic: Secondary | ICD-10-CM | POA: Insufficient documentation

## 2017-11-21 DIAGNOSIS — Z791 Long term (current) use of non-steroidal anti-inflammatories (NSAID): Secondary | ICD-10-CM | POA: Diagnosis not present

## 2017-11-21 HISTORY — DX: Gastro-esophageal reflux disease without esophagitis: K21.9

## 2017-11-21 HISTORY — DX: Personal history of urinary calculi: Z87.442

## 2017-11-21 HISTORY — DX: Unspecified osteoarthritis, unspecified site: M19.90

## 2017-11-21 LAB — BASIC METABOLIC PANEL
Anion gap: 5 (ref 5–15)
BUN: 12 mg/dL (ref 6–20)
CALCIUM: 10.3 mg/dL (ref 8.9–10.3)
CHLORIDE: 106 mmol/L (ref 101–111)
CO2: 28 mmol/L (ref 22–32)
CREATININE: 0.95 mg/dL (ref 0.61–1.24)
GFR calc Af Amer: >60 mL/min (ref >60)
GFR calc non Af Amer: >60 mL/min (ref >60)
GLUCOSE: 106 mg/dL — AB (ref 65–99)
Potassium: 4.1 mmol/L (ref 3.5–5.1)
Sodium: 139 mmol/L (ref 135–145)

## 2017-11-21 LAB — CBC
HEMATOCRIT: 40.3 % (ref 40.0–52.0)
HEMOGLOBIN: 13.4 g/dL (ref 13.0–18.0)
MCH: 31.1 pg (ref 26.0–34.0)
MCHC: 33.3 g/dL (ref 32.0–36.0)
MCV: 93.3 fL (ref 80.0–100.0)
Platelets: 154 10*3/uL (ref 150–440)
RBC: 4.32 MIL/uL — ABNORMAL LOW (ref 4.40–5.90)
RDW: 15.1 % — AB (ref 11.5–14.5)
WBC: 3.1 10*3/uL — ABNORMAL LOW (ref 3.8–10.6)

## 2017-11-21 NOTE — Patient Instructions (Addendum)
Your procedure is scheduled on: December 31,2018  Report to [place]  To find out your arrival time please call [phone] between 1PM - 3PM on November 28, 2017   Remember: Instructions that are not followed completely may result in serious medical risk, up to and including death, or upon the discretion of your surgeon and anesthesiologist your surgery may need to be rescheduled.     _X__ 1. Do not eat food after midnight the night before your procedure.                 No gum chewing or hard candies. You may drink clear liquids up to 2 hours                 before you are scheduled to arrive for your surgery- DO not drink clear                 liquids within 2 hours of the start of your surgery.                 Clear Liquids include:  water, apple juice without pulp, clear carbohydrate                 drink such as Clearfast of Gartorade, Black Coffee or Tea (Do not add                 anything to coffee or tea).     _X__ 2.  No Alcohol for 24 hours before or after surgery.   _X__ 3.  Do Not Smoke or use e-cigarettes For 24 Hours Prior to Your Surgery.                 Do not use any chewable tobacco products for at least 6 hours prior to                 surgery.  ____  4.  Bring all medications with you on the day of surgery if instructed.   __X__  5.  Notify your doctor if there is any change in your medical condition      (cold, fever, infections).     Do not wear jewelry, make-up, hairpins, clips or nail polish. Do not wear lotions, powders, or perfumes. You may wear deodorant. Do not shave 48 hours prior to surgery. Men may shave face and neck. Do not bring valuables to the hospital.    [facility] is not responsible for any belongings or valuables.  Contacts, dentures or bridgework may not be worn into surgery. Leave your suitcase in the car. After surgery it may be brought to your room. For patients admitted to the hospital,  discharge time is determined by your treatment team.   Patients discharged the day of surgery will not be allowed to drive home.   Please read over the following fact sheets that you were given:   PREPARING FOR SURGERY    ____ Take these medicines the morning of surgery with A SIP OF WATER:    1. ALLEGRA  2.  METOPROLOL  3.  PROTONIX  4.  5.  6.  ____ Fleet Enema (as directed)   _X_ Use CHG Soap as directed  ____ Use inhalers on the day of surgery  ____ Stop metformin 2 days prior to surgery    ____ Take 1/2 of usual insulin dose the night before surgery. No insulin the morning  of surgery.   ____ Stop ASPIRIN AS OF 11/25/2017    ____ Stop Anti-inflammatories on 11/25/2017 . THIS INCLUDES ALEVE / IBUPROFEN / MOTRIN / ADVIL / GOODIES POWDERS   ____ Stop supplements until after surgery.  YOU MAY CONTINUE YOUR CALCIUM - VITAMIN D3.   ____ Bring C-Pap to the hospital.   CONTINUE ALL MEDICATIONS AS USUAL, EXCEPT THE ASPIRIN AND ALEVE. ACCORDING TO YOUR PHYSICIANS, YOU SHOULD RESUME ASPIRIN 2 DAYS AFTER SURGERY.    DO NOT TAKE THE RAMIPRIL ON THE DAY OF SURGERY ONLY. PLEASE DO TAKE IT ON ALL OTHER MORNINGS.   PLEASE HAVE STOOL SOFTENERS TO TAKE ONCE HOME AND UTILIZING PAIN MEDICINE.  IF POSSIBLE, BRING IN YOUR ADVANCED MEDICAL DIRECTIVES SO IT MAY BE ENTERED INTO YOUR FILE.

## 2017-11-30 MED ORDER — CEFAZOLIN SODIUM-DEXTROSE 2-4 GM/100ML-% IV SOLN
2.0000 g | INTRAVENOUS | Status: AC
  Administered 2017-12-01: 2 g via INTRAVENOUS

## 2017-12-01 ENCOUNTER — Ambulatory Visit
Admission: RE | Admit: 2017-12-01 | Discharge: 2017-12-01 | Disposition: A | Discharge status: Home / Self Care | Payer: Medicare Other | Source: Ambulatory Visit | Attending: Orthopedic Surgery | Admitting: Orthopedic Surgery

## 2017-12-01 ENCOUNTER — Encounter
Admission: RE | Disposition: A | Discharge status: Home / Self Care | Payer: Self-pay | Source: Ambulatory Visit | Attending: Orthopedic Surgery

## 2017-12-01 ENCOUNTER — Ambulatory Visit: Payer: Medicare Other | Admitting: Anesthesiology

## 2017-12-01 ENCOUNTER — Encounter: Payer: Self-pay | Admitting: *Deleted

## 2017-12-01 DIAGNOSIS — Z85828 Personal history of other malignant neoplasm of skin: Secondary | ICD-10-CM | POA: Insufficient documentation

## 2017-12-01 DIAGNOSIS — M65862 Other synovitis and tenosynovitis, left lower leg: Secondary | ICD-10-CM | POA: Diagnosis not present

## 2017-12-01 DIAGNOSIS — M1990 Unspecified osteoarthritis, unspecified site: Secondary | ICD-10-CM | POA: Diagnosis not present

## 2017-12-01 DIAGNOSIS — Z87891 Personal history of nicotine dependence: Secondary | ICD-10-CM | POA: Insufficient documentation

## 2017-12-01 DIAGNOSIS — I10 Essential (primary) hypertension: Secondary | ICD-10-CM | POA: Insufficient documentation

## 2017-12-01 DIAGNOSIS — K219 Gastro-esophageal reflux disease without esophagitis: Secondary | ICD-10-CM | POA: Insufficient documentation

## 2017-12-01 DIAGNOSIS — S83242A Other tear of medial meniscus, current injury, left knee, initial encounter: Secondary | ICD-10-CM | POA: Insufficient documentation

## 2017-12-01 DIAGNOSIS — M94262 Chondromalacia, left knee: Secondary | ICD-10-CM | POA: Insufficient documentation

## 2017-12-01 DIAGNOSIS — X58XXXA Exposure to other specified factors, initial encounter: Secondary | ICD-10-CM | POA: Diagnosis not present

## 2017-12-01 DIAGNOSIS — E785 Hyperlipidemia, unspecified: Secondary | ICD-10-CM | POA: Insufficient documentation

## 2017-12-01 DIAGNOSIS — I2510 Atherosclerotic heart disease of native coronary artery without angina pectoris: Secondary | ICD-10-CM | POA: Insufficient documentation

## 2017-12-01 HISTORY — PX: KNEE ARTHROSCOPY WITH MEDIAL MENISECTOMY: SHX5651

## 2017-12-01 HISTORY — PX: SYNOVECTOMY: SHX5180

## 2017-12-01 SURGERY — ARTHROSCOPY, KNEE, WITH MEDIAL MENISCECTOMY
Anesthesia: General | Site: Knee | Laterality: Left

## 2017-12-01 MED ORDER — GABAPENTIN 300 MG PO CAPS
300.0000 mg | ORAL_CAPSULE | Freq: Once | ORAL | Status: AC
  Administered 2017-12-01: 300 mg via ORAL

## 2017-12-01 MED ORDER — ROPIVACAINE HCL 10 MG/ML IJ SOLN
INTRAMUSCULAR | Status: DC | PRN
  Administered 2017-12-01: 16 mL via EPIDURAL

## 2017-12-01 MED ORDER — BUPIVACAINE-EPINEPHRINE (PF) 0.25% -1:200000 IJ SOLN
INTRAMUSCULAR | Status: AC
  Filled 2017-12-01: qty 30

## 2017-12-01 MED ORDER — MORPHINE SULFATE (PF) 4 MG/ML IV SOLN
INTRAVENOUS | Status: AC
  Filled 2017-12-01: qty 1

## 2017-12-01 MED ORDER — MIDAZOLAM HCL 2 MG/2ML IJ SOLN
INTRAMUSCULAR | Status: AC
  Filled 2017-12-01: qty 2

## 2017-12-01 MED ORDER — CEFAZOLIN SODIUM-DEXTROSE 2-4 GM/100ML-% IV SOLN
INTRAVENOUS | Status: AC
  Filled 2017-12-01: qty 100

## 2017-12-01 MED ORDER — METHYLPREDNISOLONE ACETATE 40 MG/ML IJ SUSP
INTRAMUSCULAR | Status: AC
  Filled 2017-12-01: qty 1

## 2017-12-01 MED ORDER — ONDANSETRON HCL 4 MG/2ML IJ SOLN: 4.0000 mg | Freq: Once | INTRAMUSCULAR | Status: DC | PRN

## 2017-12-01 MED ORDER — EPINEPHRINE 30 MG/30ML IJ SOLN
INTRAMUSCULAR | Status: DC | PRN
  Administered 2017-12-01: 2 mL

## 2017-12-01 MED ORDER — PHENYLEPHRINE HCL 10 MG/ML IJ SOLN
INTRAMUSCULAR | Status: DC | PRN
  Administered 2017-12-01: 100 ug via INTRAVENOUS

## 2017-12-01 MED ORDER — ROPIVACAINE HCL 5 MG/ML IJ SOLN
INTRAMUSCULAR | Status: AC
  Filled 2017-12-01: qty 20

## 2017-12-01 MED ORDER — METHYLPREDNISOLONE ACETATE 40 MG/ML INJ SUSP (RADIOLOG
INTRAMUSCULAR | Status: DC | PRN
  Administered 2017-12-01: 40 mg

## 2017-12-01 MED ORDER — ACETAMINOPHEN 500 MG PO TABS
1000.0000 mg | ORAL_TABLET | Freq: Once | ORAL | Status: AC
  Administered 2017-12-01: 1000 mg via ORAL

## 2017-12-01 MED ORDER — ACETAMINOPHEN 10 MG/ML IV SOLN
INTRAVENOUS | Status: AC
  Filled 2017-12-01: qty 100

## 2017-12-01 MED ORDER — MIDAZOLAM HCL 2 MG/2ML IJ SOLN
INTRAMUSCULAR | Status: DC | PRN
  Administered 2017-12-01: 2 mg via INTRAVENOUS

## 2017-12-01 MED ORDER — EPINEPHRINE 30 MG/30ML IJ SOLN
INTRAMUSCULAR | Status: AC
  Filled 2017-12-01: qty 1

## 2017-12-01 MED ORDER — DEXAMETHASONE SODIUM PHOSPHATE 10 MG/ML IJ SOLN
INTRAMUSCULAR | Status: DC | PRN
  Administered 2017-12-01: 10 mg via INTRAVENOUS

## 2017-12-01 MED ORDER — PROPOFOL 10 MG/ML IV BOLUS
INTRAVENOUS | Status: DC | PRN
  Administered 2017-12-01: 150 mg via INTRAVENOUS

## 2017-12-01 MED ORDER — ONDANSETRON HCL 4 MG/2ML IJ SOLN
INTRAMUSCULAR | Status: DC | PRN
  Administered 2017-12-01: 4 mg via INTRAVENOUS

## 2017-12-01 MED ORDER — GABAPENTIN 300 MG PO CAPS
ORAL_CAPSULE | ORAL | Status: AC
  Administered 2017-12-01: 300 mg via ORAL
  Filled 2017-12-01: qty 1

## 2017-12-01 MED ORDER — LACTATED RINGERS IV SOLN
INTRAVENOUS | Status: DC
  Administered 2017-12-01 (×2): via INTRAVENOUS

## 2017-12-01 MED ORDER — LIDOCAINE HCL (CARDIAC) 20 MG/ML IV SOLN
INTRAVENOUS | Status: DC | PRN
  Administered 2017-12-01: 100 mg via INTRAVENOUS

## 2017-12-01 MED ORDER — HYDROCODONE-ACETAMINOPHEN 5-325 MG PO TABS: 1.0000 | ORAL_TABLET | Freq: Four times a day (QID) | ORAL | 0 refills | Status: DC | PRN

## 2017-12-01 MED ORDER — BUPIVACAINE-EPINEPHRINE (PF) 0.25% -1:200000 IJ SOLN
INTRAMUSCULAR | Status: DC | PRN
  Administered 2017-12-01: 12 mL via PERINEURAL

## 2017-12-01 MED ORDER — FENTANYL CITRATE (PF) 100 MCG/2ML IJ SOLN
INTRAMUSCULAR | Status: DC | PRN
  Administered 2017-12-01: 100 ug via INTRAVENOUS

## 2017-12-01 MED ORDER — FENTANYL CITRATE (PF) 100 MCG/2ML IJ SOLN
INTRAMUSCULAR | Status: AC
  Filled 2017-12-01: qty 2

## 2017-12-01 MED ORDER — CHLORHEXIDINE GLUCONATE 4 % EX LIQD: 60.0000 mL | Freq: Once | CUTANEOUS | Status: DC

## 2017-12-01 MED ORDER — ACETAMINOPHEN 500 MG PO TABS
ORAL_TABLET | ORAL | Status: AC
  Administered 2017-12-01: 1000 mg via ORAL
  Filled 2017-12-01: qty 2

## 2017-12-01 MED ORDER — PROPOFOL 10 MG/ML IV BOLUS
INTRAVENOUS | Status: AC
  Filled 2017-12-01: qty 20

## 2017-12-01 MED ORDER — FENTANYL CITRATE (PF) 100 MCG/2ML IJ SOLN: 25.0000 ug | INTRAMUSCULAR | Status: DC | PRN

## 2017-12-01 MED ORDER — MORPHINE SULFATE (PF) 10 MG/ML IV SOLN
INTRAVENOUS | Status: DC | PRN
  Administered 2017-12-01: 4 mg

## 2017-12-01 SURGICAL SUPPLY — 33 items
ADAPTER IRRIG TUBE 2 SPIKE SOL (ADAPTER) ×6 IMPLANT
BASIN GRAD PLASTIC 32OZ STRL (MISCELLANEOUS) ×3 IMPLANT
BLADE FULL RADIUS 3.5 (BLADE) IMPLANT
BLADE INCISOR PLUS 4.5 (BLADE) ×3 IMPLANT
BLADE SHAVER 4.5 DBL SERAT CV (CUTTER) ×3 IMPLANT
BLADE SURG SZ11 CARB STEEL (BLADE) ×3 IMPLANT
BRUSH SCRUB EZ  4% CHG (MISCELLANEOUS) ×2
BRUSH SCRUB EZ 4% CHG (MISCELLANEOUS) ×4 IMPLANT
CHLORAPREP W/TINT 26ML (MISCELLANEOUS) ×3 IMPLANT
COOLER POLAR GLACIER W/PUMP (MISCELLANEOUS) IMPLANT
DRAPE IMP U-DRAPE 54X76 (DRAPES) ×3 IMPLANT
GAUZE PETRO XEROFOAM 1X8 (MISCELLANEOUS) ×3 IMPLANT
GAUZE SPONGE 4X4 12PLY STRL (GAUZE/BANDAGES/DRESSINGS) ×3 IMPLANT
GLOVE INDICATOR 8.0 STRL GRN (GLOVE) ×3 IMPLANT
GLOVE SURG ORTHO 8.0 STRL STRW (GLOVE) ×3 IMPLANT
GOWN STRL REUS W/ TWL LRG LVL3 (GOWN DISPOSABLE) ×2 IMPLANT
GOWN STRL REUS W/ TWL XL LVL3 (GOWN DISPOSABLE) ×2 IMPLANT
GOWN STRL REUS W/TWL LRG LVL3 (GOWN DISPOSABLE) ×1
GOWN STRL REUS W/TWL XL LVL3 (GOWN DISPOSABLE) ×1
IV LACTATED RINGER IRRG 3000ML (IV SOLUTION) ×2
IV LR IRRIG 3000ML ARTHROMATIC (IV SOLUTION) ×4 IMPLANT
KIT RM TURNOVER STRD PROC AR (KITS) ×3 IMPLANT
MANIFOLD NEPTUNE II (INSTRUMENTS) ×3 IMPLANT
MAT BLUE FLOOR 46X72 FLO (MISCELLANEOUS) ×3 IMPLANT
NEEDLE SPNL 20GX3.5 QUINCKE YW (NEEDLE) ×3 IMPLANT
PACK ARTHROSCOPY KNEE (MISCELLANEOUS) ×3 IMPLANT
PAD WRAPON POLAR KNEE (MISCELLANEOUS) IMPLANT
SUT ETHILON 4-0 (SUTURE) ×1
SUT ETHILON 4-0 FS2 18XMFL BLK (SUTURE) ×2
SUTURE ETHLN 4-0 FS2 18XMF BLK (SUTURE) ×2 IMPLANT
TUBING ARTHRO INFLOW-ONLY STRL (TUBING) ×3 IMPLANT
WAND HAND CNTRL MULTIVAC 90 (MISCELLANEOUS) ×3 IMPLANT
WRAPON POLAR PAD KNEE (MISCELLANEOUS)

## 2017-12-01 NOTE — Op Note (Signed)
  PATIENT:  Shawn Kitchens Sr.  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  71 y.o. male with a diagnosis of [place] who failed conservative management and elected for surgical management.    The risks benefits and alternatives were discussed with the patient preoperatively including the risks of infection, bleeding, nerve injury, knee stiffness, persistent pain, osteoarthritis and the need for further surgery. Medical  risks include DVT and pulmonary embolism, myocardial infarction, stroke, pneumonia, respiratory failure and death. The patient understood these risks and wished to proceed.   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 ACL and PCL were intact. There was normal alignment of the patella. No loose bodies were identified within the medial or lateral gutters.  OPERATIVE PROCEDURE: Patient was met in the preoperative area. The operative extremity was signed with my initials according the hospital's correct site of surgery protocol.  The patient was brought to the operating room where they was placed supine on the operative table. General anesthesia was administered. The patient was prepped and draped in a sterile fashion.  A timeout was performed to verify the patient's name, date of birth, medical record number, correct site of surgery correct procedure to be performed. It was also used to verify the patient received antibiotics that all appropriate instruments, and radiographic  studies were available in the room. Once all in attendance were in agreement, the case began.  Proposed arthroscopy incisions were drawn out with a surgical marker. These were pre-injected with 0.5% marcaine with epinephrine. An 11 blade was used to establish an inferior lateral and inferomedial portals. The inferomedial portal was created using a 18-gauge spinal needle under direct visualization.  A full diagnostic examination of the knee was performed including the suprapatellar pouch, patellofemoral joint, medial lateral compartments as well as the medial lateral gutters, the intercondylar notch in the posterior knee.  Patient had the meniscal tear treated with a 4-0 resector shaver blade and straight duckbill basket. The meniscus was debrided until a stable rim was achieved. A chondroplasty of the trochlea and undersurface of patella was also performed using a 4-0 resector shaver blade. A partial synovectomy was also performed using a 4-0 resector shaver blade.  The knee was then copiously lavaged. All arthroscopic instruments were removed. The 2 arthroscopy portals were closed with 4-0 nylon. A dry sterile and compressive dressing was applied. The patient was brought to the PACU in stable condition. I was scrubbed and present for the entire case and all sharp and instrument counts were correct at the conclusion the case. I spoke with the patient's family postoperatively to let them know the case was performed without complication and the patient was stable in the recovery room.  Kurtis Bushman, MD

## 2017-12-01 NOTE — Anesthesia Postprocedure Evaluation (Signed)
Anesthesia Post Note  Patient: Shawn Quest Mancil Sr.  Procedure(s) Performed: KNEE ARTHROSCOPY WITH MEDIAL MENISECTOMY (Left ) SYNOVECTOMY (Left Knee)  Patient location during evaluation: PACU Anesthesia Type: General Level of consciousness: awake and alert Pain management: pain level controlled Vital Signs Assessment: post-procedure vital signs reviewed and stable Respiratory status: spontaneous breathing, nonlabored ventilation, respiratory function stable and patient connected to nasal cannula oxygen Cardiovascular status: blood pressure returned to baseline and stable Postop Assessment: no apparent nausea or vomiting Anesthetic complications: no     Last Vitals:  Vitals:   12/01/17 1116 12/01/17 1158  BP: (!) 152/68 (!) 148/74  Pulse: (!) 50 (!) 57  Resp: 14 14  Temp: (!) 35.9 C   SpO2: 99% 100%    Last Pain:  Vitals:   12/01/17 1116  TempSrc: Temporal  PainSc:                  Martha Clan

## 2017-12-01 NOTE — Anesthesia Preprocedure Evaluation (Signed)
Anesthesia Evaluation  Patient identified by MRN, date of birth, ID band Patient awake    Reviewed: Allergy & Precautions, H&P , NPO status , Patient's Chart, lab work & pertinent test results, reviewed documented beta blocker date and time   History of Anesthesia Complications Negative for: history of anesthetic complications  Airway Mallampati: I  TM Distance: >3 FB Neck ROM: full    Dental  (+) Dental Advidsory Given, Teeth Intact, Caps   Pulmonary neg pulmonary ROS, former smoker,           Cardiovascular Exercise Tolerance: Good hypertension, (-) angina+ CAD  (-) Past MI, (-) Cardiac Stents and (-) CABG + dysrhythmias (s/p ablation) Ventricular Tachycardia (-) Valvular Problems/Murmurs     Neuro/Psych negative neurological ROS  negative psych ROS   GI/Hepatic Neg liver ROS, GERD  ,  Endo/Other  negative endocrine ROS  Renal/GU Renal disease (h/o kidney stone)  negative genitourinary   Musculoskeletal   Abdominal   Peds  Hematology negative hematology ROS (+)   Anesthesia Other Findings Past Medical History: No date: Arthritis 1999: Basal cell carcinoma     Comment:  basal cell/skin No date: CAD (coronary artery disease) No date: GERD (gastroesophageal reflux disease) 2013: History of kidney stones     Comment:  passed stone on his own No date: Hyperlipidemia No date: Hypertension No date: Shingles No date: Ventricular tachycardia (HCC)   Reproductive/Obstetrics negative OB ROS                             Anesthesia Physical Anesthesia Plan  ASA: II  Anesthesia Plan: General   Post-op Pain Management:    Induction: Intravenous  PONV Risk Score and Plan: 2 and Ondansetron and Dexamethasone  Airway Management Planned: LMA  Additional Equipment:   Intra-op Plan:   Post-operative Plan: Extubation in OR  Informed Consent: I have reviewed the patients History and  Physical, chart, labs and discussed the procedure including the risks, benefits and alternatives for the proposed anesthesia with the patient or authorized representative who has indicated his/her understanding and acceptance.   Dental Advisory Given  Plan Discussed with: Anesthesiologist, CRNA and Surgeon  Anesthesia Plan Comments:         Anesthesia Quick Evaluation

## 2017-12-01 NOTE — Transfer of Care (Signed)
Immediate Anesthesia Transfer of Care Note  Patient: Shawn Quest Hingle Sr.  Procedure(s) Performed: KNEE ARTHROSCOPY WITH MEDIAL MENISECTOMY (Left ) SYNOVECTOMY (Left Knee)  Patient Location: PACU  Anesthesia Type:General  Level of Consciousness: sedated  Airway & Oxygen Therapy: Patient Spontanous Breathing and Patient connected to face mask oxygen  Post-op Assessment: Report given to RN and Post -op Vital signs reviewed and stable  Post vital signs: Reviewed and stable  Last Vitals:  Vitals:   12/01/17 0820  BP: (!) 145/93  Pulse: 67  Resp: 18  Temp: (!) 35.9 C  SpO2: 96%    Last Pain:  Vitals:   12/01/17 0820  TempSrc: Tympanic         Complications: No apparent anesthesia complications

## 2017-12-01 NOTE — Anesthesia Post-op Follow-up Note (Signed)
Anesthesia QCDR form completed.        

## 2017-12-01 NOTE — H&P (Signed)
The patient has been re-examined, and the chart reviewed, and there have been no interval changes to the documented history and physical.  Plan a left knee arthroscopy today.  Anesthesia is not consulted regarding a peripheral nerve block for post-operative pain.  The risks, benefits, and alternatives have been discussed at length, and the patient is willing to proceed.    

## 2017-12-01 NOTE — Discharge Instructions (Addendum)
Post Op Home Instructions for Knee Arthroscopy  1) Do not sit for longer than 1 hour at a time with your leg dangling down.  You should have your legs elevated (higher than your heart) in a recliner chair or couch.  2) You may be up walking around as tolerated but should take periodic breaks to elevate your legs.  Discontinue use of crutches when you feel you are able to walk without pain or a limp.  3) Work on gentle bending and straightening of the knee.  4) You may remove the Ace wrap and dressings two days after surgery.  Place band aids over the incision sites.  5) You may shower after you remove the surgical dressing.  You do not need to cover the incision with plastic wrap.  The incision can get wet, but do not submerge under water.  After your sutures have been removed, you should wait 24 hours before submerging incision under water.  6) Pain medication can cause constipation.  You should increase your fluid intake, increase your intake of high fiber foods and/or take Metamucil as needed for constipation.  7) Continue your physical therapy exercises, as shown at the office, at least twice daily.  You should set up outpatient physical therapy and start within the first week after surgery.  8) Continue to use your Polar Pack continuously for 2-3 days after surgery.  After you remove the surgical dressing, it is a good idea to use your Polar Pack or ice pack for 30 minutes after doing your exercises to reduce swelling.  9) Do not be surprised if you have increased pain at night.  This usually means you have been a little too active during the day and need to reduce your activities.  10) If you develop lower extremity swelling that does not improve after a night of elevation, please call the office.  This could be an early sign of a blood clot.  Please call with any questions at [phone]   AMBULATORY SURGERY  DISCHARGE INSTRUCTIONS   1) The drugs that you were given will stay in  your system until tomorrow so for the next 24 hours you should not:  A) Drive an automobile B) Make any legal decisions C) Drink any alcoholic beverage   2) You may resume regular meals tomorrow.  Today it is better to start with liquids and gradually work up to solid foods.  You may eat anything you prefer, but it is better to start with liquids, then soup and crackers, and gradually work up to solid foods.   3) Please notify your doctor immediately if you have any unusual bleeding, trouble breathing, redness and pain at the surgery site, drainage, fever, or pain not relieved by medication.    4) Additional Instructions: TAKE A STOOL SOFTENER TWICE A DAY WHILE TAKING NARCOTIC PAIN MEDICINE TO PREVENT CONSTIPATION   Please contact your physician with any problems or Same Day Surgery at [phone], Monday through Friday 6 am to 4 pm, or [place] [facility] number at [phone].

## 2017-12-01 NOTE — Anesthesia Procedure Notes (Signed)
Procedure Name: LMA Insertion Date/Time: 12/01/2017 9:28 AM Performed by: Nelda Marseille, CRNA Pre-anesthesia Checklist: Patient identified, Patient being monitored, Timeout performed, Emergency Drugs available and Suction available Patient Re-evaluated:Patient Re-evaluated prior to induction Oxygen Delivery Method: Circle system utilized Preoxygenation: Pre-oxygenation with 100% oxygen Induction Type: IV induction Ventilation: Mask ventilation without difficulty LMA: LMA inserted LMA Size: 4.0 Tube type: Oral Number of attempts: 1 Placement Confirmation: positive ETCO2 and breath sounds checked- equal and bilateral Tube secured with: Tape Dental Injury: Teeth and Oropharynx as per pre-operative assessment

## 2018-01-22 ENCOUNTER — Encounter: Payer: Self-pay | Admitting: Family Medicine

## 2018-04-16 ENCOUNTER — Ambulatory Visit: Payer: Medicare Other | Admitting: Family Medicine

## 2018-05-04 ENCOUNTER — Encounter: Payer: Self-pay | Admitting: Family Medicine

## 2018-05-04 ENCOUNTER — Ambulatory Visit: Payer: Medicare Other | Admitting: Family Medicine

## 2018-05-04 VITALS — BP 115/73 | HR 62 | Ht 72.0 in | Wt 212.0 lb

## 2018-05-04 DIAGNOSIS — E7800 Pure hypercholesterolemia, unspecified: Secondary | ICD-10-CM | POA: Diagnosis not present

## 2018-05-04 DIAGNOSIS — I151 Hypertension secondary to other renal disorders: Secondary | ICD-10-CM | POA: Diagnosis not present

## 2018-05-04 DIAGNOSIS — N2889 Other specified disorders of kidney and ureter: Secondary | ICD-10-CM

## 2018-05-04 DIAGNOSIS — I472 Ventricular tachycardia, unspecified: Secondary | ICD-10-CM

## 2018-05-04 LAB — LP+ALT+AST PICCOLO, WAIVED
ALT (SGPT) PICCOLO, WAIVED: 37 U/L (ref 10–47)
AST (SGOT) Piccolo, Waived: 35 U/L (ref 11–38)
Chol/HDL Ratio Piccolo,Waive: 2.2 mg/dL
Cholesterol Piccolo, Waived: 123 mg/dL (ref <200)
HDL Chol Piccolo, Waived: 57 mg/dL — ABNORMAL LOW (ref >59)
LDL CHOL CALC PICCOLO WAIVED: 51 mg/dL (ref <100)
Triglycerides Piccolo,Waived: 72 mg/dL (ref <150)
VLDL CHOL CALC PICCOLO,WAIVE: 14 mg/dL (ref <30)

## 2018-05-04 NOTE — Assessment & Plan Note (Signed)
The current medical regimen is effective;  continue present plan and medications.  

## 2018-05-04 NOTE — Progress Notes (Signed)
BP 115/73   Pulse 62   Ht 6' (1.829 m)   Wt 212 lb (96.2 kg)   SpO2 98%   BMI 28.75 kg/m    Subjective:    Patient ID: Shawn Kitchens Sr., male    DOB: 07-18-1946, 72 y.o.   MRN: [id number]  HPI: Shawn BASNETT Sr. is a 72 y.o. male  Chief Complaint  Patient presents with  . Follow-up  . Hypertension  . Hyperlipidemia  Patient doing well with blood pressure cholesterol no issues with medications taken faithfully without problems. Has had a lot going on endocardial appellation for atrial fibrillation.  Did well from the surgery problems. Does have some internal hemorrhoid type symptoms otherwise is doing well.  Uses as needed Anusol cream  Relevant past medical, surgical, family and social history reviewed and updated as indicated. Interim medical history since our last visit reviewed. Allergies and medications reviewed and updated.  Review of Systems  Constitutional: Negative.   Respiratory: Negative.   Cardiovascular: Negative.     Per HPI unless specifically indicated above     Objective:    BP 115/73   Pulse 62   Ht 6' (1.829 m)   Wt 212 lb (96.2 kg)   SpO2 98%   BMI 28.75 kg/m   Wt Readings from Last 3 Encounters:  05/04/18 212 lb (96.2 kg)  11/21/17 207 lb (93.9 kg)  10/17/17 215 lb 4.8 oz (97.7 kg)    Physical Exam  Constitutional: He is oriented to person, place, and time. He appears well-developed and well-nourished.  HENT:  Head: Normocephalic and atraumatic.  Eyes: Conjunctivae and EOM are normal.  Neck: Normal range of motion.  Cardiovascular: Normal rate, regular rhythm and normal heart sounds.  Pulmonary/Chest: Effort normal and breath sounds normal.  Genitourinary: Rectum normal.  Musculoskeletal: Normal range of motion.  Neurological: He is alert and oriented to person, place, and time.  Skin: No erythema.  Psychiatric: He has a normal mood and affect. His behavior is normal. Judgment and thought content normal.    Results for  orders placed or performed during the hospital encounter of 11/21/17  CBC  Result Value Ref Range   WBC 3.1 (L) 3.8 - 10.6 K/uL   RBC 4.32 (L) 4.40 - 5.90 MIL/uL   Hemoglobin 13.4 13.0 - 18.0 g/dL   HCT 40.3 40.0 - 52.0 %   MCV 93.3 80.0 - 100.0 fL   MCH 31.1 26.0 - 34.0 pg   MCHC 33.3 32.0 - 36.0 g/dL   RDW 15.1 (H) 11.5 - 14.5 %   Platelets 154 150 - 440 K/uL  Basic metabolic panel  Result Value Ref Range   Sodium 139 135 - 145 mmol/L   Potassium 4.1 3.5 - 5.1 mmol/L   Chloride 106 101 - 111 mmol/L   CO2 28 22 - 32 mmol/L   Glucose, Bld 106 (H) 65 - 99 mg/dL   BUN 12 6 - 20 mg/dL   Creatinine, Ser 0.95 0.61 - 1.24 mg/dL   Calcium 10.3 8.9 - 10.3 mg/dL   GFR calc non Af Amer >60 >60 mL/min   GFR calc Af Amer >60 >60 mL/min   Anion gap 5 5 - 15      Assessment & Plan:   Problem List Items Addressed This Visit      Cardiovascular and Mediastinum   Hypertension - Primary    The current medical regimen is effective;  continue present plan and medications.  Relevant Orders   Basic metabolic panel   LP+ALT+AST Piccolo, Waived   Ventricular tachycardia [facility])    The current medical regimen is effective;  continue present plan and medications.         Other   Hyperlipidemia    The current medical regimen is effective;  continue present plan and medications.       Relevant Orders   Basic metabolic panel   LP+ALT+AST Piccolo, Waived       Follow up plan: Return in about 6 months (around 11/03/2018) for Physical Exam.

## 2018-05-05 ENCOUNTER — Encounter: Payer: Self-pay | Admitting: Family Medicine

## 2018-05-05 LAB — BASIC METABOLIC PANEL
BUN/Creatinine Ratio: 16 (ref 10–24)
BUN: 15 mg/dL (ref 8–27)
CALCIUM: 10.4 mg/dL — AB (ref 8.6–10.2)
CHLORIDE: 107 mmol/L — AB (ref 96–106)
CO2: 23 mmol/L (ref 20–29)
Creatinine, Ser: 0.92 mg/dL (ref 0.76–1.27)
GFR calc non Af Amer: 84 mL/min/{1.73_m2} (ref >59)
GFR, EST AFRICAN AMERICAN: 97 mL/min/{1.73_m2} (ref >59)
GLUCOSE: 113 mg/dL — AB (ref 65–99)
POTASSIUM: 4.6 mmol/L (ref 3.5–5.2)
Sodium: 139 mmol/L (ref 134–144)

## 2018-06-01 ENCOUNTER — Ambulatory Visit: Payer: Medicare Other | Admitting: Internal Medicine

## 2018-06-01 ENCOUNTER — Encounter: Payer: Self-pay | Admitting: Internal Medicine

## 2018-06-01 VITALS — BP 124/64 | HR 67 | Ht 72.0 in | Wt 214.0 lb

## 2018-06-01 DIAGNOSIS — I428 Other cardiomyopathies: Secondary | ICD-10-CM

## 2018-06-01 DIAGNOSIS — I472 Ventricular tachycardia, unspecified: Secondary | ICD-10-CM

## 2018-06-01 DIAGNOSIS — I10 Essential (primary) hypertension: Secondary | ICD-10-CM

## 2018-06-01 NOTE — Progress Notes (Signed)
PCP: [name], MD Primary Cardiologist: Dr Humphrey Rolls Primary EP: Dr Ethelle Lyon Nesbit Sr. is a 72 y.o. male who presents today for routine electrophysiology followup.  Since last being seen in our clinic, the patient reports doing very well.  He remains active. Today, he denies symptoms of palpitations, chest pain, shortness of breath,  lower extremity edema, dizziness, presyncope, or syncope.  The patient is otherwise without complaint today.   Past Medical History:  Diagnosis Date  . Arthritis   . Basal cell carcinoma 1999   basal cell/skin  . CAD (coronary artery disease)   . GERD (gastroesophageal reflux disease)   . History of kidney stones 2013   passed stone on his own  . Hyperlipidemia   . Hypertension   . Shingles   . Ventricular tachycardia [facility])    Past Surgical History:  Procedure Laterality Date  . CARDIAC CATHETERIZATION N/A 01/16/2016   Procedure: Left Heart Cath and Coronary Angiography;  Surgeon: [name], MD;  Location: [place] [facility];  Service: Cardiovascular;  Laterality: N/A;  . CATARACT EXTRACTION Left 2017  . CORONARY ANGIOPLASTY  2000 and 2003  . GANGLION CYST EXCISION  1979  . KNEE ARTHROSCOPY WITH MEDIAL MENISECTOMY Left 12/01/2017   Procedure: KNEE ARTHROSCOPY WITH MEDIAL MENISECTOMY;  Surgeon: [name], MD;  Location: [facility];  Service: Orthopedics;  Laterality: Left;  Partial  . LEFT HEART CATH Right 08/07/2017   Procedure: Left Heart Cath;  Surgeon: [name], MD;  Location: [place] [facility];  Service: Cardiovascular;  Laterality: Right;  . PILONIDAL CYST EXCISION  1977  . SKIN CANCER EXCISION  1999   ear  . SYNOVECTOMY Left 12/01/2017   Procedure: SYNOVECTOMY;  Surgeon: [name], MD;  Location: [facility];  Service: Orthopedics;  Laterality: Left;  Partial  . V TACH ABLATION N/A 08/14/2017   Procedure: V Tach Ablation;  Surgeon: [name], MD;  Location: [place] [facility];  Service:  Cardiovascular;  Laterality: N/A;  . VEIN SURGERY  2000   laser    ROS- all systems are reviewed and negatives except as per HPI above  Current Outpatient Medications  Medication Sig Dispense Refill  . acetaminophen (TYLENOL) 500 MG tablet Take 1,000 mg by mouth every 6 (six) hours as needed for moderate pain or headache.    Marland Kitchen aspirin EC 81 MG tablet Take 81 mg by mouth daily.    Marland Kitchen atorvastatin (LIPITOR) 80 MG tablet Take 1 tablet (80 mg total) at bedtime by mouth. 90 tablet 4  . Calcium Carb-Cholecalciferol (CALCIUM 600/VITAMIN D3 PO) Take 1 tablet by mouth daily.    . fexofenadine (ALLEGRA) 180 MG tablet Take 1 tablet (180 mg total) by mouth daily. 30 tablet 12  . HYDROcodone-acetaminophen (NORCO/VICODIN) 5-325 MG tablet Take 1 tablet by mouth every 6 (six) hours as needed for moderate pain. 12 tablet 0  . metoprolol succinate (TOPROL-XL) 25 MG 24 hr tablet Take 25 mg by mouth daily.    . naproxen sodium (ALEVE) 220 MG tablet Take 220 mg by mouth 2 (two) times daily as needed (for pain or headache).    . niacin (NIASPAN) 1000 MG CR tablet Take 1 tablet (1,000 mg total) at bedtime by mouth. 90 tablet 4  . pantoprazole (PROTONIX) 40 MG tablet Take 1 tablet (40 mg total) by mouth daily. 30 tablet 10  . ramipril (ALTACE) 5 MG capsule Take 1 capsule (5 mg total) by mouth daily.  30 capsule 0  . flecainide (TAMBOCOR) 100 MG tablet Take 2 tablets (200 mg total) as needed by mouth. As needed for recurrent tachycardia.  Do not repeat more than once per day or 3x per wk (Patient not taking: Reported on 06/01/2018) 2 tablet 3   No current facility-administered medications for this visit.     Physical Exam: Vitals:   06/01/18 1158  BP: 124/64  Pulse: 67  Weight: 214 lb (97.1 kg)  Height: 6' (1.829 m)    GEN- The patient is well appearing, alert and oriented x 3 today.   Head- normocephalic, atraumatic Eyes-  Sclera clear, conjunctiva pink Ears- hearing intact Oropharynx- clear Lungs- Clear  to ausculation bilaterally, normal work of breathing Heart- Regular rate and rhythm, no murmurs, rubs or gallops, PMI not laterally displaced GI- soft, NT, ND, + BS Extremities- no clubbing, cyanosis, or edema  Wt Readings from Last 3 Encounters:  06/01/18 214 lb (97.1 kg)  05/04/18 212 lb (96.2 kg)  11/21/17 207 lb (93.9 kg)    EKG tracing ordered today is personally reviewed and shows sinus rhythm 67 bpm, PR 158 msec, QRS 94 msec, QTc 414 msec, LVH, nonspecific St/T changes  Assessment and Plan:  1. 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 HTN Stable No change required today  4. Mild obstructive CAD On statin  Return as needed Follow with Dr Humphrey Rolls as scheduled  Thompson Grayer MD, [facility] 06/01/2018 12:08 PM

## 2018-06-01 NOTE — Patient Instructions (Signed)
Medication Instructions:  Your physician recommends that you continue on your current medications as directed. Please refer to the Current Medication list given to you today.  Labwork: None ordered     *We will only notify you of abnormal results, otherwise continue current treatment plan.  Testing/Procedures: None ordered  Follow-Up: No follow up is needed at this time with Dr. Rayann Heman.  He will see you on an as needed basis.   * If you need a refill on your cardiac medications before your next appointment, please call your pharmacy.   *Please note that any paperwork needing to be filled out by the provider will need to be addressed at the front desk prior to seeing the provider. Please note that any FMLA, disability or other documents regarding health condition is subject to a $25.00 charge that must be received prior to completion of paperwork in the form of a money order or check.  Thank you for choosing [facility]!!

## 2018-10-01 ENCOUNTER — Ambulatory Visit (INDEPENDENT_AMBULATORY_CARE_PROVIDER_SITE_OTHER): Payer: Medicare Other

## 2018-10-01 VITALS — BP 118/72 | HR 63 | Temp 98.1°F | Resp 16 | Ht 71.5 in | Wt 213.2 lb

## 2018-10-01 DIAGNOSIS — Z0000 Encounter for general adult medical examination without abnormal findings: Secondary | ICD-10-CM | POA: Diagnosis not present

## 2018-10-01 NOTE — Patient Instructions (Signed)
Shawn Shepard , Thank you for taking time to come for your Medicare Wellness Visit. I appreciate your ongoing commitment to your health goals. Please review the following plan we discussed and let me know if I can assist you in the future.   Screening recommendations/referrals: Colonoscopy: due in 2020  Recommended yearly ophthalmology/optometry visit for glaucoma screening and checkup Recommended yearly dental visit for hygiene and checkup  Vaccinations: Influenza vaccine: Up to date Pneumococcal vaccine: Up to date Tdap vaccine: due - contact us if cut or scrape Shingles vaccine: Shingrix discussed     Conditions/risks identified: recommend drinking 6-8 glasses of water per day  Next appointment: 11/09/18 Dr. Jeananne Rama 10:00  Preventive Care 2 Years and Older, Male Preventive care refers to lifestyle choices and visits with your health care provider that can promote health and wellness. What does preventive care include?  A yearly physical exam. This is also called an annual well check.  Dental exams once or twice a year.  Routine eye exams. Ask your health care provider how often you should have your eyes checked.  Personal lifestyle choices, including:  Daily care of your teeth and gums.  Regular physical activity.  Eating a healthy diet.  Avoiding tobacco and drug use.  Limiting alcohol use.  Practicing safe sex.  Taking low doses of aspirin every day.  Taking vitamin and mineral supplements as recommended by your health care provider. What happens during an annual well check? The services and screenings done by your health care provider during your annual well check will depend on your age, overall health, lifestyle risk factors, and family history of disease. Counseling  Your health care provider may ask you questions about your:  Alcohol use.  Tobacco use.  Drug use.  Emotional well-being.  Home and relationship well-being.  Sexual activity.  Eating  habits.  History of falls.  Memory and ability to understand (cognition).  Work and work Statistician. Screening  You may have the following tests or measurements:  Height, weight, and BMI.  Blood pressure.  Lipid and cholesterol levels. These may be checked every 5 years, or more frequently if you are over 35 years old.  Skin check.  Lung cancer screening. You may have this screening every year starting at age 103 if you have a 30-pack-year history of smoking and currently smoke or have quit within the past 15 years.  Fecal occult blood test (FOBT) of the stool. You may have this test every year starting at age 82.  Flexible sigmoidoscopy or colonoscopy. You may have a sigmoidoscopy every 5 years or a colonoscopy every 10 years starting at age 43.  Prostate cancer screening. Recommendations will vary depending on your family history and other risks.  Hepatitis C blood test.  Hepatitis B blood test.  Sexually transmitted disease (STD) testing.  Diabetes screening. This is done by checking your blood sugar (glucose) after you have not eaten for a while (fasting). You may have this done every 1-3 years.  Abdominal aortic aneurysm (AAA) screening. You may need this if you are a current or former smoker.  Osteoporosis. You may be screened starting at age 33 if you are at high risk. Talk with your health care provider about your test results, treatment options, and if necessary, the need for more tests. Vaccines  Your health care provider may recommend certain vaccines, such as:  Influenza vaccine. This is recommended every year.  Tetanus, diphtheria, and acellular pertussis (Tdap, Td) vaccine. You may need a Td  booster every 10 years.  Zoster vaccine. You may need this after age 11.  Pneumococcal 13-valent conjugate (PCV13) vaccine. One dose is recommended after age 6.  Pneumococcal polysaccharide (PPSV23) vaccine. One dose is recommended after age 28. Talk to your health  care provider about which screenings and vaccines you need and how often you need them. This information is not intended to replace advice given to you by your health care provider. Make sure you discuss any questions you have with your health care provider. Document Released: 12/15/2015 Document Revised: 08/07/2016 Document Reviewed: 09/19/2015 Elsevier Interactive Patient Education  2017 [place] Prevention in the Home Falls can cause injuries. They can happen to people of all ages. There are many things you can do to make your home safe and to help prevent falls. What can I do on the outside of my home?  Regularly fix the edges of walkways and driveways and fix any cracks.  Remove anything that might make you trip as you walk through a door, such as a raised step or threshold.  Trim any bushes or trees on the path to your home.  Use bright outdoor lighting.  Clear any walking paths of anything that might make someone trip, such as rocks or tools.  Regularly check to see if handrails are loose or broken. Make sure that both sides of any steps have handrails.  Any raised decks and porches should have guardrails on the edges.  Have any leaves, snow, or ice cleared regularly.  Use sand or salt on walking paths during winter.  Clean up any spills in your garage right away. This includes oil or grease spills. What can I do in the bathroom?  Use night lights.  Install grab bars by the toilet and in the tub and shower. Do not use towel bars as grab bars.  Use non-skid mats or decals in the tub or shower.  If you need to sit down in the shower, use a plastic, non-slip stool.  Keep the floor dry. Clean up any water that spills on the floor as soon as it happens.  Remove soap buildup in the tub or shower regularly.  Attach bath mats securely with double-sided non-slip rug tape.  Do not have throw rugs and other things on the floor that can make you trip. What can I do  in the bedroom?  Use night lights.  Make sure that you have a light by your bed that is easy to reach.  Do not use any sheets or blankets that are too big for your bed. They should not hang down onto the floor.  Have a firm chair that has side arms. You can use this for support while you get dressed.  Do not have throw rugs and other things on the floor that can make you trip. What can I do in the kitchen?  Clean up any spills right away.  Avoid walking on wet floors.  Keep items that you use a lot in easy-to-reach places.  If you need to reach something above you, use a strong step stool that has a grab bar.  Keep electrical cords out of the way.  Do not use floor polish or wax that makes floors slippery. If you must use wax, use non-skid floor wax.  Do not have throw rugs and other things on the floor that can make you trip. What can I do with my stairs?  Do not leave any items on the stairs.  Make sure that there are handrails on both sides of the stairs and use them. Fix handrails that are broken or loose. Make sure that handrails are as long as the stairways.  Check any carpeting to make sure that it is firmly attached to the stairs. Fix any carpet that is loose or worn.  Avoid having throw rugs at the top or bottom of the stairs. If you do have throw rugs, attach them to the floor with carpet tape.  Make sure that you have a light switch at the top of the stairs and the bottom of the stairs. If you do not have them, ask someone to add them for you. What else can I do to help prevent falls?  Wear shoes that:  Do not have high heels.  Have rubber bottoms.  Are comfortable and fit you well.  Are closed at the toe. Do not wear sandals.  If you use a stepladder:  Make sure that it is fully opened. Do not climb a closed stepladder.  Make sure that both sides of the stepladder are locked into place.  Ask someone to hold it for you, if possible.  Clearly mark  and make sure that you can see:  Any grab bars or handrails.  First and last steps.  Where the edge of each step is.  Use tools that help you move around (mobility aids) if they are needed. These include:  Canes.  Walkers.  Scooters.  Crutches.  Turn on the lights when you go into a dark area. Replace any light bulbs as soon as they burn out.  Set up your furniture so you have a clear path. Avoid moving your furniture around.  If any of your floors are uneven, fix them.  If there are any pets around you, be aware of where they are.  Review your medicines with your doctor. Some medicines can make you feel dizzy. This can increase your chance of falling. Ask your doctor what other things that you can do to help prevent falls. This information is not intended to replace advice given to you by your health care provider. Make sure you discuss any questions you have with your health care provider. Document Released: 09/14/2009 Document Revised: 04/25/2016 Document Reviewed: 12/23/2014 Elsevier Interactive Patient Education  2017 [employer].

## 2018-10-01 NOTE — Progress Notes (Signed)
Subjective:   Shawn Kitchens Sr. is a 72 y.o. male who presents for Medicare Annual/Subsequent preventive examination.  Review of Systems:   Cardiac Risk Factors include: advanced age (>50men, >41 women);male gender;hypertension;dyslipidemia     Objective:    Vitals: BP 118/72 (BP Location: Left Arm, Patient Position: Sitting, Cuff Size: Normal)   Pulse 63   Temp 98.1 F (36.7 C)   Resp 16   Ht 5' 11.5" (1.816 m)   Wt 213 lb 3.2 oz (96.7 kg)   SpO2 96%   BMI 29.32 kg/m   Body mass index is 29.32 kg/m.  Advanced Directives 10/01/2018 12/01/2017 11/21/2017 09/11/2017 08/11/2017 08/07/2017 08/06/2017  Does Patient Have a Medical Advance Directive? Yes Yes Yes Yes Yes Yes Yes  Type of Paramedic of [place];Living will - [place];Living will [place];Living will [place];Living will - [place];Living will  Does patient want to make changes to medical advance directive? - - No - Patient declined - No - Patient declined - No - Patient declined  Copy of [place] in Chart? Yes Yes No - copy requested No - copy requested - - -    Tobacco Social History   Tobacco Use  Smoking Status Former Smoker  . Types: Cigarettes  . Last attempt to quit: 06/21/1971  . Years since quitting: 47.3  Smokeless Tobacco Former Systems developer  . Types: Chew  . Quit date: 06/20/1980     Counseling given: Not Answered   Clinical Intake:  Pre-visit preparation completed: Yes  Pain : 0-10 Pain Score: 7  Pain Type: Acute pain Pain Location: Foot Pain Orientation: Right Pain Descriptors / Indicators: Throbbing Pain Onset: 1 to 4 weeks ago Pain Frequency: Intermittent     Nutritional Status: BMI 25 -29 Overweight Nutritional Risks: None Diabetes: No  How often do you need to have someone help you when you read instructions, pamphlets, or other written materials from your doctor or  pharmacy?: 1 - Never What is the last grade level you completed in school?: masters  Interpreter Needed?: No  Information entered by :: Clemetine Marker LPN  Past Medical History:  Diagnosis Date  . Arthritis   . Basal cell carcinoma 1999   basal cell/skin  . CAD (coronary artery disease)   . GERD (gastroesophageal reflux disease)   . History of kidney stones 2013   passed stone on his own  . Hyperlipidemia   . Hypertension   . Shingles   . Ventricular tachycardia [facility])    Past Surgical History:  Procedure Laterality Date  . CARDIAC CATHETERIZATION N/A 01/16/2016   Procedure: Left Heart Cath and Coronary Angiography;  Surgeon: [name], MD;  Location: [place] [facility];  Service: Cardiovascular;  Laterality: N/A;  . CATARACT EXTRACTION Left 2017  . CORONARY ANGIOPLASTY  2000 and 2003  . GANGLION CYST EXCISION  1979  . KNEE ARTHROSCOPY WITH MEDIAL MENISECTOMY Left 12/01/2017   Procedure: KNEE ARTHROSCOPY WITH MEDIAL MENISECTOMY;  Surgeon: [name], MD;  Location: [facility] ORS;  Service: Orthopedics;  Laterality: Left;  Partial  . LEFT HEART CATH Right 08/07/2017   Procedure: Left Heart Cath;  Surgeon: [name], MD;  Location: [place] [facility];  Service: Cardiovascular;  Laterality: Right;  . PILONIDAL CYST EXCISION  1977  . SKIN CANCER EXCISION  1999   ear  . SYNOVECTOMY Left 12/01/2017   Procedure: SYNOVECTOMY;  Surgeon: Kurtis Bushman  R, MD;  Location: [facility] OR[facility];  Service: Orthopedics;  Laterality: Left;  Partial  . V TACH ABLATION N/A 08/14/2017   Procedure: V Tach Ablation;  Surgeon: [name], MD;  Location: [place] [facility];  Service: Cardiovascular;  Laterality: N/A;  . VEIN SURGERY  2000   laser   Family History  Problem Relation Age of Onset  . Hypertension Mother   . Heart disease Mother   . Heart attack Mother   . Diabetes Mother   . Alzheimer's disease Father   . Congestive Heart Failure Brother   . Hypertension Brother   . Other  Brother        dementia  . Brain cancer Daughter 7   Social History   Socioeconomic History  . Marital status: Married    Spouse name: Not on file  . Number of children: 2  . Years of education: Not on file  . Highest education level: Master's degree (e.g., MA, MS, MEng, MEd, MSW, MBA)  Occupational History  . Occupation: retired  Scientific laboratory technician  . Financial resource strain: Not hard at all  . Food insecurity:    Worry: Never true    Inability: Never true  . Transportation needs:    Medical: No    Non-medical: No  Tobacco Use  . Smoking status: Former Smoker    Types: Cigarettes    Last attempt to quit: 06/21/1971    Years since quitting: 47.3  . Smokeless tobacco: Former Systems developer    Types: [place] date: 06/20/1980  Substance and Sexual Activity  . Alcohol use: Yes    Alcohol/week: 2.0 standard drinks    Types: 2 Glasses of wine per week    Comment: 1-2 a week  . Drug use: No  . Sexual activity: Not on file  Lifestyle  . Physical activity:    Days per week: 5 days    Minutes per session: 30 min  . Stress: Patient refused  Relationships  . Social connections:    Talks on phone: More than three times a week    Gets together: More than three times a week    Attends religious service: More than 4 times per year    Active member of club or organization: Yes    Attends meetings of clubs or organizations: More than 4 times per year    Relationship status: Married  Other Topics Concern  . Not on file  Social History Narrative   Lives in [place]   Retired school principal    Outpatient Encounter Medications as of 10/01/2018  Medication Sig  . acetaminophen (TYLENOL) 500 MG tablet Take 1,000 mg by mouth every 6 (six) hours as needed for moderate pain or headache.  Marland Kitchen aspirin EC 81 MG tablet Take 81 mg by mouth daily.  Marland Kitchen atorvastatin (LIPITOR) 80 MG tablet Take 1 tablet (80 mg total) at bedtime by mouth.  . Calcium Carb-Cholecalciferol (CALCIUM 600/VITAMIN D3 PO)  Take 1 tablet by mouth daily.  . chlorpheniramine-HYDROcodone (TUSSIONEX) 10-8 MG/5ML SUER hydrocodone 10 mg-chlorpheniramine 8 mg/5 mL oral susp extend.rel 12hr  . fexofenadine (ALLEGRA) 180 MG tablet Take 1 tablet (180 mg total) by mouth daily.  . flecainide (TAMBOCOR) 100 MG tablet Take 2 tablets (200 mg total) as needed by mouth. As needed for recurrent tachycardia.  Do not repeat more than once per day or 3x per wk  . FLUAD 0.5 ML SUSY ADMINISTER 0.5ML IN THE MUSCLE AS DIRECTED  . HYDROcodone-acetaminophen (NORCO/VICODIN)  5-325 MG tablet Take 1 tablet by mouth every 6 (six) hours as needed for moderate pain.  . metoprolol succinate (TOPROL-XL) 25 MG 24 hr tablet Take 25 mg by mouth daily.  . naproxen sodium (ALEVE) 220 MG tablet Take 220 mg by mouth 2 (two) times daily as needed (for pain or headache).  . niacin (NIASPAN) 1000 MG CR tablet Take 1 tablet (1,000 mg total) at bedtime by mouth.  . nitroGLYCERIN (NITROSTAT) 0.4 MG SL tablet nitroglycerin 0.4 mg subl  . pantoprazole (PROTONIX) 40 MG tablet Take 1 tablet (40 mg total) by mouth daily.  . ramipril (ALTACE) 5 MG capsule Take 1 capsule (5 mg total) by mouth daily.   No facility-administered encounter medications on file as of 10/01/2018.     Activities of Daily Living In your present state of health, do you have any difficulty performing the following activities: 10/01/2018 05/04/2018  Hearing? N N  Comment declines hearing aids -  Vision? N N  Comment wears glasses -  Difficulty concentrating or making decisions? N N  Walking or climbing stairs? N N  Dressing or bathing? N N  Doing errands, shopping? N N  Preparing Food and eating ? N -  Using the Toilet? N -  In the past six months, have you accidently leaked urine? N -  Do you have problems with loss of bowel control? N -  Managing your Medications? N -  Managing your Finances? N -  Housekeeping or managing your Housekeeping? N -  Some recent data might be hidden     Patient Care Team: [name], MD as PCP - General (Family Medicine) [name], MD as Consulting Physician (Cardiology)   Assessment:   This is a routine wellness examination for [place].  Exercise Activities and Dietary recommendations Current Exercise Habits: Home exercise routine, Type of exercise: walking;Other - see comments(plays golf twice weekly), Time (Minutes): 30, Frequency (Times/Week): 5, Weekly Exercise (Minutes/Week): 150, Intensity: Moderate, Exercise limited by: None identified  Goals    . DIET - INCREASE WATER INTAKE     Recommend drinking 6-8 glasses of water per day       Fall Risk Fall Risk  10/01/2018 05/04/2018 10/17/2017 09/11/2017 03/04/2017  Falls in the past year? No No No No No   I FALL RISK PREVENTION PERTAINING TO THE HOME:  Any stairs in or around the home WITH handrails? Yes  Home free of loose throw rugs in walkways, pet beds, electrical cords, etc? Yes  Adequate lighting in your home to reduce risk of falls? Yes   ASSISTIVE DEVICES UTILIZED TO PREVENT FALLS:  Life alert? No  Use of a cane, walker or w/c? No  Grab bars in the bathroom? Yes  Shower chair or bench in shower? Yes  Elevated toilet seat or a handicapped toilet? No   DME ORDERS:  DME order needed?  No   TIMED UP AND GO:  Was the test performed? Yes .  Length of time to ambulate 10 feet: 6 sec.   GAIT:  Appearance of gait: Gait stead-fast and without the use of an assistive device. Education: Fall risk prevention has been discussed.  Intervention(s) required? No     Depression Screen PHQ 2/9 Scores 10/01/2018 05/04/2018 10/17/2017 09/11/2017  PHQ - 2 Score 0 0 0 0  PHQ- 9 Score 2 - 0 -    Cognitive Function     6CIT Screen 10/01/2018 09/11/2017  What Year? 0 points 0 points  What month?  0 points 0 points  What time? 0 points 0 points  Count back from 20 0 points 0 points  Months in reverse 0 points 0 points  Repeat phrase 0 points 0 points  Total  Score 0 0    Immunization History  Administered Date(s) Administered  . Influenza, High Dose Seasonal PF 08/26/2017  . Influenza-Unspecified 09/27/2015, 08/21/2016, 08/26/2017  . Pneumococcal Conjugate-13 12/08/2014  . Pneumococcal Polysaccharide-23 09/10/2016  . Td 12/21/2007  . Zoster 12/22/2008    Qualifies for Shingles Vaccine? Yes  Zostavax completed 12/22/08. Due for Shingrix. Education has been provided regarding the importance of this vaccine. Pt has been advised to call insurance company to determine out of pocket expense. Advised may also receive vaccine at local pharmacy or Health Dept. Verbalized acceptance and understanding.  Tdap: Although this vaccine is not a covered service during a Wellness Exam, does the patient still wish to receive this vaccine today?  No .  Education has been provided regarding the importance of this vaccine. Advised may receive this vaccine at local pharmacy or Health Dept. Aware to provide a copy of the vaccination record if obtained from local pharmacy or Health Dept. Verbalized acceptance and understanding.  Flu Vaccine: Up to date  Pneumococcal Vaccine: Up to date   Screening Tests Health Maintenance  Topic Date Due  . TETANUS/TDAP  12/20/2017  . INFLUENZA VACCINE  07/02/2018  . COLONOSCOPY  02/15/2019  . Hepatitis C Screening  Completed  . PNA vac Low Risk Adult  Completed   Cancer Screenings:  Colorectal Screening: Completed 02/14/09. Repeat every 10 years;   Lung Cancer Screening: (Low Dose CT Chest recommended if Age 44-80 years, 30 pack-year currently smoking OR have quit w/in 15years.) does not qualify.    Additional Screening:  Hepatitis C Screening: does qualify; Completed 12/28/15  Vision Screening: Recommended annual ophthalmology exams for early detection of glaucoma and other disorders of the eye. Is the patient up to date with their annual eye exam?  Yes  Who is the provider or what is the name of the office in which  the pt attends annual eye exams? [place] Screening: Recommended annual dental exams for proper oral hygiene  Community Resource Referral:  CRR required this visit?  No       Plan:      I have personally reviewed and addressed the Medicare Annual Wellness questionnaire and have noted the following in the patient's chart:  A. Medical and social history B. Use of alcohol, tobacco or illicit drugs  C. Current medications and supplements D. Functional ability and status E.  Nutritional status F.  Physical activity G. Advance directives H. List of other physicians I.  Hospitalizations, surgeries, and ER visits in previous 12 months J.  [place] such as hearing and vision if needed, cognitive and depression L. Referrals and appointments   In addition, I have reviewed and discussed with patient certain preventive protocols, quality metrics, and best practice recommendations. A written personalized care plan for preventive services as well as general preventive health recommendations were provided to patient.   Signed,  Clemetine Marker, LPN Nurse Health Advisor   Nurse Notes: pt c/o pain in right heel of foot with concern about possible gout, pain started 3 weeks ago. Advised pt to schedule appt prior to next visit. appt scheduled for Monday 10/05/18 with Merrie Roof PAC.

## 2018-10-05 ENCOUNTER — Ambulatory Visit: Payer: Medicare Other | Admitting: Family Medicine

## 2018-10-05 ENCOUNTER — Encounter: Payer: Self-pay | Admitting: Family Medicine

## 2018-10-05 VITALS — BP 138/78 | HR 59 | Temp 98.4°F | Ht 73.0 in | Wt 216.4 lb

## 2018-10-05 DIAGNOSIS — M722 Plantar fascial fibromatosis: Secondary | ICD-10-CM

## 2018-10-05 MED ORDER — PREDNISONE 10 MG PO TABS: ORAL_TABLET | ORAL | 0 refills | Status: DC

## 2018-10-05 NOTE — Progress Notes (Signed)
BP 138/78 (BP Location: Left Arm, Patient Position: Sitting, Cuff Size: Normal)   Pulse (!) 59   Temp 98.4 F (36.9 C)   Ht 6\' 1"  (1.854 m)   Wt 216 lb 6 oz (98.1 kg)   SpO2 97%   BMI 28.55 kg/m    Subjective:    Patient ID: Shawn Kitchens Sr., male    DOB: 1945/12/19, 72 y.o.   MRN: [id number]  HPI: Shawn UNANGST Sr. is a 72 y.o. male  Chief Complaint  Patient presents with  . Foot Pain    right, heel X 4 weeks   4 weeks of right heel pain, worse after playing golf, first thing in the morning when he starts walking around, and getting up after sitting for long periods. Throbbing pain on the ball of his heel. Tries to wear very supportive shoes and takes aleve prn. Pain is causing a limp. Denies fevers, injury, redness, swelling, wound.   Relevant past medical, surgical, family and social history reviewed and updated as indicated. Interim medical history since our last visit reviewed. Allergies and medications reviewed and updated.  Review of Systems  Per HPI unless specifically indicated above     Objective:    BP 138/78 (BP Location: Left Arm, Patient Position: Sitting, Cuff Size: Normal)   Pulse (!) 59   Temp 98.4 F (36.9 C)   Ht 6\' 1"  (1.854 m)   Wt 216 lb 6 oz (98.1 kg)   SpO2 97%   BMI 28.55 kg/m   Wt Readings from Last 3 Encounters:  10/05/18 216 lb 6 oz (98.1 kg)  10/01/18 213 lb 3.2 oz (96.7 kg)  06/01/18 214 lb (97.1 kg)    Physical Exam  Constitutional: He is oriented to person, place, and time. He appears well-developed and well-nourished. No distress.  HENT:  Head: Atraumatic.  Eyes: Conjunctivae and EOM are normal.  Neck: Normal range of motion. Neck supple.  Cardiovascular: Normal rate and regular rhythm.  Pulmonary/Chest: Effort normal and breath sounds normal.  Musculoskeletal: Normal range of motion. He exhibits tenderness (ttp posterior heel to sole of right foot). He exhibits no edema or deformity.  Neurological: He is alert and  oriented to person, place, and time.  Skin: Skin is warm and dry.  Psychiatric: He has a normal mood and affect. His behavior is normal.  Nursing note and vitals reviewed.   Results for orders placed or performed in visit on 32/95/18  Basic metabolic panel  Result Value Ref Range   Glucose 113 (H) 65 - 99 mg/dL   BUN 15 8 - 27 mg/dL   Creatinine, Ser 0.92 0.76 - 1.27 mg/dL   GFR calc non Af Amer 84 >59 mL/min/1.73   GFR calc Af Amer 97 >59 mL/min/1.73   BUN/Creatinine Ratio 16 10 - 24   Sodium 139 134 - 144 mmol/L   Potassium 4.6 3.5 - 5.2 mmol/L   Chloride 107 (H) 96 - 106 mmol/L   CO2 23 20 - 29 mmol/L   Calcium 10.4 (H) 8.6 - 10.2 mg/dL  LP+ALT+AST Piccolo, Waived  Result Value Ref Range   ALT (SGPT) Piccolo, Waived 37 10 - 47 U/L   AST (SGOT) Piccolo, Waived 35 11 - 38 U/L   Cholesterol Piccolo, Waived 123 <200 mg/dL   HDL Chol Piccolo, Waived 57 (L) >59 mg/dL   Triglycerides Piccolo,Waived 72 <150 mg/dL   Chol/HDL Ratio Piccolo,Waive 2.2 mg/dL   LDL Chol Calc Piccolo Waived 51 <100 mg/dL  VLDL Chol Calc Piccolo,Waive 14 <30 mg/dL      Assessment & Plan:   Problem List Items Addressed This Visit    None    Visit Diagnoses    Plantar fasciitis    -  Primary   prednisone taper sent, reviewed stretches, night brace, shoe inserts, rolling bottom of foot. OTC pain relievers prn.        Follow up plan: Return if symptoms worsen or fail to improve.

## 2018-10-05 NOTE — Patient Instructions (Addendum)
Inserts for shoes, plantar fasciitis brace for night time, aleve as needed, prednisone taper as prescribed, roll bottom of foot with frozen water bottle, calf stretches  Plantar Fasciitis Rehab Ask your health care provider which exercises are safe for you. Do exercises exactly as told by your health care provider and adjust them as directed. It is normal to feel mild stretching, pulling, tightness, or discomfort as you do these exercises, but you should stop right away if you feel sudden pain or your pain gets worse. Do not begin these exercises until told by your health care provider. Stretching and range of motion exercises These exercises warm up your muscles and joints and improve the movement and flexibility of your foot. These exercises also help to relieve pain. Exercise A: Plantar fascia stretch  1. Sit with your left / right leg crossed over your opposite knee. 2. Hold your heel with one hand with that thumb near your arch. With your other hand, hold your toes and gently pull them back toward the top of your foot. You should feel a stretch on the bottom of your toes or your foot or both. 3. Hold this stretch for__________ seconds. 4. Slowly release your toes and return to the starting position. Repeat __________ times. Complete this exercise __________ times a day. Exercise B: Gastroc, standing  1. Stand with your hands against a wall. 2. Extend your left / right leg behind you, and bend your front knee slightly. 3. Keeping your heels on the floor and keeping your back knee straight, shift your weight toward the wall without arching your back. You should feel a gentle stretch in your left / right calf. 4. Hold this position for __________ seconds. Repeat __________ times. Complete this exercise __________ times a day. Exercise C: Soleus, standing 1. Stand with your hands against a wall. 2. Extend your left / right leg behind you, and bend your front knee slightly. 3. Keeping your  heels on the floor, bend your back knee and slightly shift your weight over the back leg. You should feel a gentle stretch deep in your calf. 4. Hold this position for __________ seconds. Repeat __________ times. Complete this exercise __________ times a day. Exercise D: Gastrocsoleus, standing 1. Stand with the ball of your left / right foot on a step. The ball of your foot is on the walking surface, right under your toes. 2. Keep your other foot firmly on the same step. 3. Hold onto the wall or a railing for balance. 4. Slowly lift your other foot, allowing your body weight to press your heel down over the edge of the step. You should feel a stretch in your left / right calf. 5. Hold this position for __________ seconds. 6. Return both feet to the step. 7. Repeat this exercise with a slight bend in your left / right knee. Repeat __________ times with your left / right knee straight and __________ times with your left / right knee bent. Complete this exercise __________ times a day. Balance exercise This exercise builds your balance and strength control of your arch to help take pressure off your plantar fascia. Exercise E: Single leg stand 1. Without shoes, stand near a railing or in a doorway. You may hold onto the railing or door frame as needed. 2. Stand on your left / right foot. Keep your big toe down on the floor and try to keep your arch lifted. Do not let your foot roll inward. 3. Hold this position for  __________ seconds. 4. If this exercise is too easy, you can try it with your eyes closed or while standing on a pillow. Repeat __________ times. Complete this exercise __________ times a day. This information is not intended to replace advice given to you by your health care provider. Make sure you discuss any questions you have with your health care provider. Document Released: 11/18/2005 Document Revised: 07/23/2016 Document Reviewed: 10/02/2015 Elsevier Interactive Patient  Education  2018 [employer].

## 2018-10-10 ENCOUNTER — Other Ambulatory Visit: Payer: Self-pay | Admitting: Family Medicine

## 2018-10-12 NOTE — Telephone Encounter (Signed)
Prescription expired on 11/06/18. NOV  11/09/18. Refilled for 30 days until the appointment scheduled. Requested Prescriptions  Pending Prescriptions Disp Refills  . pantoprazole (PROTONIX) 40 MG tablet [Pharmacy Med Name: PANTOPRAZOLE 40MG  TABLETS] 30 tablet 0    Sig: TAKE 1 TABLET BY MOUTH ONCE DAILY     Gastroenterology: Proton Pump Inhibitors Passed - 10/10/2018  8:26 AM      Passed - Valid encounter within last 12 months    Recent Outpatient Visits          1 week ago Plantar fasciitis   [place], PA-C   5 months ago Hypertension secondary to other renal disorders   [facility] Jeananne Rama, Jeannette How, MD   12 months ago Advanced care planning/counseling discussion   [facility] [name], MD   1 year ago Cough   [facility] [name], [place]   1 year ago Upper respiratory tract infection, unspecified type   [facility], Lilia Argue, [place]      Future Appointments            In 4 weeks Crissman, Jeannette How, MD [facility], [place]   In 1 year  [facility], [facility]

## 2018-11-09 ENCOUNTER — Encounter: Payer: Self-pay | Admitting: Family Medicine

## 2018-11-09 ENCOUNTER — Ambulatory Visit (INDEPENDENT_AMBULATORY_CARE_PROVIDER_SITE_OTHER): Payer: Medicare Other | Admitting: Family Medicine

## 2018-11-09 VITALS — BP 144/83 | HR 63 | Temp 98.4°F | Ht 73.0 in | Wt 212.0 lb

## 2018-11-09 DIAGNOSIS — I2583 Coronary atherosclerosis due to lipid rich plaque: Secondary | ICD-10-CM

## 2018-11-09 DIAGNOSIS — I2510 Atherosclerotic heart disease of native coronary artery without angina pectoris: Secondary | ICD-10-CM

## 2018-11-09 DIAGNOSIS — Z125 Encounter for screening for malignant neoplasm of prostate: Secondary | ICD-10-CM

## 2018-11-09 DIAGNOSIS — Z1329 Encounter for screening for other suspected endocrine disorder: Secondary | ICD-10-CM | POA: Diagnosis not present

## 2018-11-09 DIAGNOSIS — K2100 Gastro-esophageal reflux disease with esophagitis, without bleeding: Secondary | ICD-10-CM

## 2018-11-09 DIAGNOSIS — E7800 Pure hypercholesterolemia, unspecified: Secondary | ICD-10-CM

## 2018-11-09 DIAGNOSIS — I10 Essential (primary) hypertension: Secondary | ICD-10-CM

## 2018-11-09 DIAGNOSIS — N400 Enlarged prostate without lower urinary tract symptoms: Secondary | ICD-10-CM

## 2018-11-09 DIAGNOSIS — Z7189 Other specified counseling: Secondary | ICD-10-CM

## 2018-11-09 LAB — URINALYSIS, ROUTINE W REFLEX MICROSCOPIC
Bilirubin, UA: NEGATIVE
Glucose, UA: NEGATIVE
Ketones, UA: NEGATIVE
LEUKOCYTES UA: NEGATIVE
NITRITE UA: NEGATIVE
PH UA: 5.5 (ref 5.0–7.5)
PROTEIN UA: NEGATIVE
RBC, UA: NEGATIVE
SPEC GRAV UA: 1.02 (ref 1.005–1.030)
Urobilinogen, Ur: 1 mg/dL (ref 0.2–1.0)

## 2018-11-09 LAB — MICROSCOPIC EXAMINATION
EPITHELIAL CELLS (NON RENAL): NONE SEEN /HPF (ref 0–10)
RBC, UA: NONE SEEN /hpf (ref 0–2)
WBC, UA: NONE SEEN /hpf (ref 0–5)

## 2018-11-09 MED ORDER — NIACIN ER (ANTIHYPERLIPIDEMIC) 1000 MG PO TBCR: 1000.0000 mg | EXTENDED_RELEASE_TABLET | Freq: Every day | ORAL | 4 refills | Status: DC

## 2018-11-09 MED ORDER — ATORVASTATIN CALCIUM 80 MG PO TABS: 80.0000 mg | ORAL_TABLET | Freq: Every day | ORAL | 4 refills | Status: DC

## 2018-11-09 MED ORDER — FEXOFENADINE HCL 180 MG PO TABS: 180.0000 mg | ORAL_TABLET | Freq: Every day | ORAL | 4 refills | Status: DC

## 2018-11-09 MED ORDER — PANTOPRAZOLE SODIUM 40 MG PO TBEC: 40.0000 mg | DELAYED_RELEASE_TABLET | Freq: Every day | ORAL | 4 refills | Status: DC

## 2018-11-09 NOTE — Progress Notes (Signed)
BP (!) 144/83   Pulse 63   Temp 98.4 F (36.9 C)   Ht 6\' 1"  (1.854 m)   Wt 212 lb (96.2 kg)   SpO2 97%   BMI 27.97 kg/m    Subjective:    Patient ID: Shawn Kitchens Sr., male    DOB: Jul 08, 1946, 72 y.o.   MRN: [id number]  HPI: Shawn LIPPY Sr. is a 72 y.o. male  Chief Complaint  Patient presents with  . Annual Exam  Patient follow-up all in all doing well no complaints taking medications without problems. No further episodes of atrial fibrillation or coronary symptoms. Patient had knee surgery and has recovered well.  Plantar fasciitis is getting much better.   Relevant past medical, surgical, family and social history reviewed and updated as indicated. Interim medical history since our last visit reviewed. Allergies and medications reviewed and updated.  Review of Systems  Constitutional: Negative.   HENT: Negative.   Eyes: Negative.   Respiratory: Negative.   Cardiovascular: Negative.   Gastrointestinal: Negative.   Endocrine: Negative.   Genitourinary: Negative.   Musculoskeletal: Negative.   Skin: Negative.   Allergic/Immunologic: Negative.   Neurological: Negative.   Hematological: Negative.   Psychiatric/Behavioral: Negative.     Per HPI unless specifically indicated above     Objective:    BP (!) 144/83   Pulse 63   Temp 98.4 F (36.9 C)   Ht 6\' 1"  (1.854 m)   Wt 212 lb (96.2 kg)   SpO2 97%   BMI 27.97 kg/m   Wt Readings from Last 3 Encounters:  11/09/18 212 lb (96.2 kg)  10/05/18 216 lb 6 oz (98.1 kg)  10/01/18 213 lb 3.2 oz (96.7 kg)    Physical Exam  Constitutional: He is oriented to person, place, and time. He appears well-developed and well-nourished.  HENT:  Head: Normocephalic and atraumatic.  Right Ear: External ear normal.  Left Ear: External ear normal.  Eyes: Pupils are equal, round, and reactive to light. Conjunctivae and EOM are normal.  Neck: Normal range of motion. Neck supple.  Cardiovascular: Normal rate, regular  rhythm, normal heart sounds and intact distal pulses.  Pulmonary/Chest: Effort normal and breath sounds normal.  Abdominal: Soft. Bowel sounds are normal. There is no splenomegaly or hepatomegaly.  Genitourinary: Rectum normal and penis normal.  Genitourinary Comments: BPH changes  Musculoskeletal: Normal range of motion.  Neurological: He is alert and oriented to person, place, and time. He has normal reflexes.  Skin: No rash noted. No erythema.  Psychiatric: He has a normal mood and affect. His behavior is normal. Judgment and thought content normal.    Results for orders placed or performed in visit on 40/08/67  Basic metabolic panel  Result Value Ref Range   Glucose 113 (H) 65 - 99 mg/dL   BUN 15 8 - 27 mg/dL   Creatinine, Ser 0.92 0.76 - 1.27 mg/dL   GFR calc non Af Amer 84 >59 mL/min/1.73   GFR calc Af Amer 97 >59 mL/min/1.73   BUN/Creatinine Ratio 16 10 - 24   Sodium 139 134 - 144 mmol/L   Potassium 4.6 3.5 - 5.2 mmol/L   Chloride 107 (H) 96 - 106 mmol/L   CO2 23 20 - 29 mmol/L   Calcium 10.4 (H) 8.6 - 10.2 mg/dL  LP+ALT+AST Piccolo, Waived  Result Value Ref Range   ALT (SGPT) Piccolo, Waived 37 10 - 47 U/L   AST (SGOT) Piccolo, Waived 35 11 - 38 U/L  Cholesterol Piccolo, Waived 123 <200 mg/dL   HDL Chol Piccolo, Waived 57 (L) >59 mg/dL   Triglycerides Piccolo,Waived 72 <150 mg/dL   Chol/HDL Ratio Piccolo,Waive 2.2 mg/dL   LDL Chol Calc Piccolo Waived 51 <100 mg/dL   VLDL Chol Calc Piccolo,Waive 14 <30 mg/dL      Assessment & Plan:   Problem List Items Addressed This Visit      Cardiovascular and Mediastinum   CAD (coronary artery disease)    The current medical regimen is effective;  continue present plan and medications.       Hypertension - Primary    The current medical regimen is effective;  continue present plan and medications.       Relevant Orders   CBC with Differential/Platelet   Comprehensive metabolic panel   Lipid panel   Urinalysis,  Routine w reflex microscopic     Digestive   Reflux esophagitis    The current medical regimen is effective;  continue present plan and medications.         Genitourinary   BPH (benign prostatic hyperplasia)    Stable         Other   Hyperlipidemia    The current medical regimen is effective;  continue present plan and medications.       Relevant Orders   CBC with Differential/Platelet   Comprehensive metabolic panel   Lipid panel   Urinalysis, Routine w reflex microscopic   Advanced care planning/counseling discussion    A voluntary discussion about advanced care planning including explanation and discussion of advanced directives was extentively discussed with the patient.  Explained about the healthcare proxy and living will was reviewed and packet with forms with expiration of how to fill them out was given.  Time spent: Encounter 16+ min individuals present: Patient       Other Visit Diagnoses    Screening for prostate cancer       Relevant Orders   PSA   Thyroid disorder screen       Relevant Orders   TSH       Follow up plan: Return in about 6 months (around 05/11/2019) for BMP,  Lipids, ALT, AST.

## 2018-11-09 NOTE — Assessment & Plan Note (Signed)
The current medical regimen is effective;  continue present plan and medications.  

## 2018-11-09 NOTE — Assessment & Plan Note (Signed)
Stable

## 2018-11-09 NOTE — Assessment & Plan Note (Signed)
A voluntary discussion about advanced care planning including explanation and discussion of advanced directives was extentively discussed with the patient.  Explained about the healthcare proxy and living will was reviewed and packet with forms with expiration of how to fill them out was given.  Time spent: Encounter 16+ min individuals present: Patient 

## 2018-11-10 LAB — COMPREHENSIVE METABOLIC PANEL
ALK PHOS: 63 IU/L (ref 39–117)
ALT: 19 IU/L (ref 0–44)
AST: 25 IU/L (ref 0–40)
Albumin/Globulin Ratio: 2.1 (ref 1.2–2.2)
Albumin: 4 g/dL (ref 3.5–4.8)
BUN/Creatinine Ratio: 16 (ref 10–24)
BUN: 14 mg/dL (ref 8–27)
Bilirubin Total: 0.7 mg/dL (ref 0.0–1.2)
CALCIUM: 10.3 mg/dL — AB (ref 8.6–10.2)
CO2: 24 mmol/L (ref 20–29)
CREATININE: 0.86 mg/dL (ref 0.76–1.27)
Chloride: 106 mmol/L (ref 96–106)
GFR calc Af Amer: 101 mL/min/{1.73_m2} (ref >59)
GFR, EST NON AFRICAN AMERICAN: 87 mL/min/{1.73_m2} (ref >59)
GLUCOSE: 98 mg/dL (ref 65–99)
Globulin, Total: 1.9 g/dL (ref 1.5–4.5)
Potassium: 4.5 mmol/L (ref 3.5–5.2)
SODIUM: 140 mmol/L (ref 134–144)
Total Protein: 5.9 g/dL — ABNORMAL LOW (ref 6.0–8.5)

## 2018-11-10 LAB — CBC WITH DIFFERENTIAL/PLATELET
BASOS ABS: 0 10*3/uL (ref 0.0–0.2)
Basos: 1 %
EOS (ABSOLUTE): 0.2 10*3/uL (ref 0.0–0.4)
Eos: 8 %
Hematocrit: 40.8 % (ref 37.5–51.0)
Hemoglobin: 13.6 g/dL (ref 13.0–17.7)
IMMATURE GRANULOCYTES: 0 %
Immature Grans (Abs): 0 10*3/uL (ref 0.0–0.1)
LYMPHS ABS: 0.7 10*3/uL (ref 0.7–3.1)
Lymphs: 24 %
MCH: 30.9 pg (ref 26.6–33.0)
MCHC: 33.3 g/dL (ref 31.5–35.7)
MCV: 93 fL (ref 79–97)
MONOS ABS: 0.5 10*3/uL (ref 0.1–0.9)
Monocytes: 17 %
NEUTROS PCT: 50 %
Neutrophils Absolute: 1.4 10*3/uL (ref 1.4–7.0)
PLATELETS: 162 10*3/uL (ref 150–450)
RBC: 4.4 x10E6/uL (ref 4.14–5.80)
RDW: 12.8 % (ref 12.3–15.4)
WBC: 2.8 10*3/uL — AB (ref 3.4–10.8)

## 2018-11-10 LAB — PSA: Prostate Specific Ag, Serum: 0.4 ng/mL (ref 0.0–4.0)

## 2018-11-10 LAB — LIPID PANEL
CHOL/HDL RATIO: 2.2 ratio (ref 0.0–5.0)
CHOLESTEROL TOTAL: 126 mg/dL (ref 100–199)
HDL: 58 mg/dL (ref >39)
LDL CALC: 55 mg/dL (ref 0–99)
TRIGLYCERIDES: 63 mg/dL (ref 0–149)
VLDL Cholesterol Cal: 13 mg/dL (ref 5–40)

## 2018-11-10 LAB — TSH: TSH: 1.26 u[IU]/mL (ref 0.450–4.500)

## 2018-11-11 ENCOUNTER — Telehealth: Payer: Self-pay | Admitting: Family Medicine

## 2018-11-11 DIAGNOSIS — D72819 Decreased white blood cell count, unspecified: Secondary | ICD-10-CM

## 2018-11-11 NOTE — Telephone Encounter (Signed)
Phone call Discussed with patient low WBC will recheck CBC in a couple of weeks

## 2018-12-10 ENCOUNTER — Other Ambulatory Visit: Payer: Medicare Other

## 2018-12-10 DIAGNOSIS — D72819 Decreased white blood cell count, unspecified: Secondary | ICD-10-CM

## 2018-12-11 LAB — CBC WITH DIFFERENTIAL/PLATELET
BASOS ABS: 0 10*3/uL (ref 0.0–0.2)
Basos: 1 %
EOS (ABSOLUTE): 0.2 10*3/uL (ref 0.0–0.4)
Eos: 9 %
HEMOGLOBIN: 13.4 g/dL (ref 13.0–17.7)
Hematocrit: 40.8 % (ref 37.5–51.0)
Immature Grans (Abs): 0 10*3/uL (ref 0.0–0.1)
Immature Granulocytes: 0 %
LYMPHS ABS: 0.6 10*3/uL — AB (ref 0.7–3.1)
Lymphs: 23 %
MCH: 31.2 pg (ref 26.6–33.0)
MCHC: 32.8 g/dL (ref 31.5–35.7)
MCV: 95 fL (ref 79–97)
MONOCYTES: 13 %
MONOS ABS: 0.4 10*3/uL (ref 0.1–0.9)
Neutrophils Absolute: 1.4 10*3/uL (ref 1.4–7.0)
Neutrophils: 54 %
PLATELETS: 163 10*3/uL (ref 150–450)
RBC: 4.3 x10E6/uL (ref 4.14–5.80)
RDW: 12.8 % (ref 11.6–15.4)
WBC: 2.6 10*3/uL — AB (ref 3.4–10.8)

## 2018-12-14 ENCOUNTER — Telehealth: Payer: Self-pay | Admitting: Family Medicine

## 2018-12-14 DIAGNOSIS — D72829 Elevated white blood cell count, unspecified: Secondary | ICD-10-CM

## 2018-12-14 NOTE — Telephone Encounter (Signed)
-----   Message from Amada Kingfisher, [place] sent at 12/14/2018 12:05 PM EST ----- Patient was transferred to provider for telephone conversation.

## 2018-12-14 NOTE — Telephone Encounter (Signed)
Phone call Discussed with patient WBC count drifting lower will refer to hematology to further review.

## 2018-12-22 ENCOUNTER — Other Ambulatory Visit: Payer: Self-pay

## 2018-12-22 ENCOUNTER — Encounter: Payer: Self-pay | Admitting: Oncology

## 2018-12-22 ENCOUNTER — Inpatient Hospital Stay: Payer: Medicare Other

## 2018-12-22 ENCOUNTER — Inpatient Hospital Stay: Payer: Medicare Other | Attending: Oncology | Admitting: Oncology

## 2018-12-22 VITALS — BP 151/81 | HR 54 | Temp 96.9°F | Resp 18 | Ht 73.0 in | Wt 213.0 lb

## 2018-12-22 DIAGNOSIS — Z87891 Personal history of nicotine dependence: Secondary | ICD-10-CM | POA: Diagnosis not present

## 2018-12-22 DIAGNOSIS — Z7982 Long term (current) use of aspirin: Secondary | ICD-10-CM | POA: Diagnosis not present

## 2018-12-22 DIAGNOSIS — D72810 Lymphocytopenia: Secondary | ICD-10-CM

## 2018-12-22 DIAGNOSIS — E785 Hyperlipidemia, unspecified: Secondary | ICD-10-CM | POA: Insufficient documentation

## 2018-12-22 DIAGNOSIS — Z79899 Other long term (current) drug therapy: Secondary | ICD-10-CM | POA: Insufficient documentation

## 2018-12-22 DIAGNOSIS — I10 Essential (primary) hypertension: Secondary | ICD-10-CM

## 2018-12-22 DIAGNOSIS — K219 Gastro-esophageal reflux disease without esophagitis: Secondary | ICD-10-CM | POA: Diagnosis not present

## 2018-12-22 LAB — CBC WITH DIFFERENTIAL/PLATELET
Abs Immature Granulocytes: 0.01 10*3/uL (ref 0.00–0.07)
Basophils Absolute: 0 10*3/uL (ref 0.0–0.1)
Basophils Relative: 1 %
Eosinophils Absolute: 0.2 10*3/uL (ref 0.0–0.5)
Eosinophils Relative: 8 %
HCT: 41.1 % (ref 39.0–52.0)
Hemoglobin: 13.3 g/dL (ref 13.0–17.0)
Immature Granulocytes: 0 %
Lymphocytes Relative: 22 %
Lymphs Abs: 0.6 10*3/uL — ABNORMAL LOW (ref 0.7–4.0)
MCH: 30.5 pg (ref 26.0–34.0)
MCHC: 32.4 g/dL (ref 30.0–36.0)
MCV: 94.3 fL (ref 80.0–100.0)
Monocytes Absolute: 0.4 10*3/uL (ref 0.1–1.0)
Monocytes Relative: 15 %
Neutro Abs: 1.5 10*3/uL — ABNORMAL LOW (ref 1.7–7.7)
Neutrophils Relative %: 54 %
PLATELETS: 137 10*3/uL — AB (ref 150–400)
RBC: 4.36 MIL/uL (ref 4.22–5.81)
RDW: 13.5 % (ref 11.5–15.5)
WBC: 2.8 10*3/uL — ABNORMAL LOW (ref 4.0–10.5)
nRBC: 0 % (ref 0.0–0.2)

## 2018-12-22 LAB — FOLATE: Folate: 13.2 ng/mL (ref >5.9)

## 2018-12-22 LAB — VITAMIN B12: VITAMIN B 12: 273 pg/mL (ref 180–914)

## 2018-12-22 NOTE — Progress Notes (Signed)
Patient here for initial visit. No concerns voiced.  

## 2018-12-22 NOTE — Progress Notes (Signed)
Hematology/Oncology Consult note [facility] [phone] [phone]   Patient Care Team: [name], MD as PCP - General (Family Medicine) [name], MD as Consulting Physician (Cardiology)  REFERRING PROVIDER: [name], MD  CHIEF COMPLAINTS/REASON FOR VISIT:  Evaluation of leukopenia  HISTORY OF PRESENTING ILLNESS:  Shawn Shepard Sr. is a  73 y.o.  male with PMH listed below who was referred to me for evaluation of leukopenia. Reviewed patient's recent labs. Patient has low total WBC count was 2.6, predominantly absolute lymphocyte 0.6. Earlier lab lab done 11/2019 showed WBC 2.8, lymphocyte 0.7, neutrophil 1.4.  Previous lab records reviewed. 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 No aggravating or improving factors.  Associated symptoms:  denies fatigue, weight loss, fever, chills, frequent infection.  History hepatitis or HIV infection: Denies History of chronic liver disease: Denies History of blood transfusion: Denies Alcohol consumption: Denies Diet Vegetarian or Vegan: Denies Herbal medication: Denies  Reports feeling quite tired and fatigued recently.  He and his wife recently downsized to a one-story home and he has been moving a lot of furniture's by himself.  He feels it took a week for his knee to recover after the most. Denies any unintentional weight loss, fever or chills, night sweating. He is very active, walks every day. Previous occupation was a Product/process development scientist.  Drinks a cocktail every day.  Usually drinks cocktail with quinine tonic water for lower extremity cramps.  He started to do this after his ventricular tachycardic episodes about 1-1/2 years ago. Former smoker, quitting in 1972.  Review of Systems  Constitutional: Positive for malaise/fatigue. Negative for chills, fever and weight loss.  HENT: Negative for sore throat.   Eyes: Negative for  redness.  Respiratory: Negative for cough, shortness of breath and wheezing.   Cardiovascular: Negative for chest pain, palpitations and leg swelling.  Gastrointestinal: Negative for abdominal pain, blood in stool, nausea and vomiting.  Genitourinary: Negative for dysuria.  Musculoskeletal: Negative for myalgias.  Skin: Negative for rash.  Neurological: Negative for dizziness, tingling and tremors.  Endo/Heme/Allergies: Does not bruise/bleed easily.  Psychiatric/Behavioral: Negative for hallucinations.    MEDICAL HISTORY:  Past Medical History:  Diagnosis Date  . Arthritis   . Basal cell carcinoma 1999   basal cell/skin  . CAD (coronary artery disease)   . GERD (gastroesophageal reflux disease)   . History of kidney stones 2013   passed stone on his own  . Hyperlipidemia   . Hypertension   . Shingles   . Ventricular tachycardia ([place])     SURGICAL HISTORY: Past Surgical History:  Procedure Laterality Date  . CARDIAC CATHETERIZATION N/A 01/16/2016   Procedure: Left Heart Cath and Coronary Angiography;  Surgeon: [name], MD;  Location: [facility];  Service: Cardiovascular;  Laterality: N/A;  . CATARACT EXTRACTION Left 2017  . CORONARY ANGIOPLASTY  2000 and 2003  . GANGLION CYST EXCISION  1979  . KNEE ARTHROSCOPY WITH MEDIAL MENISECTOMY Left 12/01/2017   Procedure: KNEE ARTHROSCOPY WITH MEDIAL MENISECTOMY;  Surgeon: [name], MD;  Location: [facility];  Service: Orthopedics;  Laterality: Left;  Partial  . LEFT HEART CATH Right 08/07/2017   Procedure: Left Heart Cath;  Surgeon: [name], MD;  Location: [facility];  Service: Cardiovascular;  Laterality: Right;  . PILONIDAL CYST EXCISION  1977  . SKIN CANCER EXCISION  1999   ear  . SYNOVECTOMY Left 12/01/2017   Procedure:  SYNOVECTOMY;  Surgeon: [name], MD;  Location: [facility];  Service: Orthopedics;  Laterality: Left;  Partial  . V TACH ABLATION N/A 08/14/2017   Procedure: V Tach  Ablation;  Surgeon: [name], MD;  Location: [facility];  Service: Cardiovascular;  Laterality: N/A;  . VEIN SURGERY  2000   laser    SOCIAL HISTORY: Social History   Socioeconomic History  . Marital status: Married    Spouse name: Not on file  . Number of children: 2  . Years of education: Not on file  . Highest education level: Master's degree (e.g., MA, MS, MEng, MEd, MSW, MBA)  Occupational History  . Occupation: retired  Scientific laboratory technician  . Financial resource strain: Not hard at all  . Food insecurity:    Worry: Never true    Inability: Never true  . Transportation needs:    Medical: No    Non-medical: No  Tobacco Use  . Smoking status: Former Smoker    Types: Cigarettes    Last attempt to quit: 06/21/1971    Years since quitting: 47.5  . Smokeless tobacco: Former Systems developer    Types: [place] date: 06/20/1980  Substance and Sexual Activity  . Alcohol use: Yes    Alcohol/week: 2.0 standard drinks    Types: 2 Glasses of wine per week    Comment: 1-2 a week  . Drug use: No  . Sexual activity: Not on file  Lifestyle  . Physical activity:    Days per week: 5 days    Minutes per session: 30 min  . Stress: Patient refused  Relationships  . Social connections:    Talks on phone: More than three times a week    Gets together: More than three times a week    Attends religious service: More than 4 times per year    Active member of club or organization: Yes    Attends meetings of clubs or organizations: More than 4 times per year    Relationship status: Married  . Intimate partner violence:    Fear of current or ex partner: No    Emotionally abused: No    Physically abused: No    Forced sexual activity: No  Other Topics Concern  . Not on file  Social History Narrative   Lives in [place]   Retired school principal    FAMILY HISTORY: Family History  Problem Relation Age of Onset  . Hypertension Mother   . Heart disease Mother   . Heart  attack Mother   . Diabetes Mother   . Alzheimer's disease Father   . Congestive Heart Failure Brother   . Hypertension Brother   . Other Brother        dementia  . Brain cancer Daughter 51    ALLERGIES:  has No Known Allergies.  MEDICATIONS:  Current Outpatient Medications  Medication Sig Dispense Refill  . acetaminophen (TYLENOL) 500 MG tablet Take 1,000 mg by mouth every 6 (six) hours as needed for moderate pain or headache.    Marland Kitchen aspirin EC 81 MG tablet Take 81 mg by mouth daily.    Marland Kitchen atorvastatin (LIPITOR) 80 MG tablet Take 1 tablet (80 mg total) by mouth at bedtime. 90 tablet 4  . Calcium Carb-Cholecalciferol (CALCIUM 600/VITAMIN D3 PO) Take 1 tablet by mouth daily.    . fexofenadine (ALLEGRA) 180 MG tablet Take 1 tablet (180 mg total) by mouth daily. 90 tablet 4  . flecainide (TAMBOCOR)  100 MG tablet Take 2 tablets (200 mg total) as needed by mouth. As needed for recurrent tachycardia.  Do not repeat more than once per day or 3x per wk 2 tablet 3  . metoprolol succinate (TOPROL-XL) 25 MG 24 hr tablet Take 25 mg by mouth daily.    . naproxen sodium (ALEVE) 220 MG tablet Take 220 mg by mouth 2 (two) times daily as needed (for pain or headache).    . niacin (NIASPAN) 1000 MG CR tablet Take 1 tablet (1,000 mg total) by mouth at bedtime. 90 tablet 4  . nitroGLYCERIN (NITROSTAT) 0.4 MG SL tablet nitroglycerin 0.4 mg subl    . pantoprazole (PROTONIX) 40 MG tablet Take 1 tablet (40 mg total) by mouth daily. 90 tablet 4  . ramipril (ALTACE) 5 MG capsule Take 1 capsule (5 mg total) by mouth daily. 30 capsule 0   No current facility-administered medications for this visit.      PHYSICAL EXAMINATION: ECOG PERFORMANCE STATUS: 0 - Asymptomatic Vitals:   12/22/18 1406  BP: (!) 151/81  Pulse: (!) 54  Resp: 18  Temp: (!) 96.9 F (36.1 C)   Filed Weights   12/22/18 1406  Weight: 213 lb (96.6 kg)    Physical Exam Constitutional:      General: He is not in acute distress. HENT:      Head: Normocephalic and atraumatic.  Eyes:     General: No scleral icterus.    Pupils: Pupils are equal, round, and reactive to light.  Neck:     Musculoskeletal: Normal range of motion and neck supple.  Cardiovascular:     Rate and Rhythm: Normal rate and regular rhythm.     Heart sounds: Normal heart sounds.  Pulmonary:     Effort: Pulmonary effort is normal. No respiratory distress.     Breath sounds: No wheezing.  Abdominal:     General: Bowel sounds are normal. There is no distension.     Palpations: Abdomen is soft. There is no mass.     Tenderness: There is no abdominal tenderness.  Musculoskeletal: Normal range of motion.        General: No deformity.  Skin:    General: Skin is warm and dry.     Findings: No erythema or rash.  Neurological:     Mental Status: He is alert and oriented to person, place, and time.     Cranial Nerves: No cranial nerve deficit.     Coordination: Coordination normal.  Psychiatric:        Behavior: Behavior normal.        Thought Content: Thought content normal.      LABORATORY DATA:  I have reviewed the data as listed Lab Results  Component Value Date   WBC 2.8 (L) 12/22/2018   HGB 13.3 12/22/2018   HCT 41.1 12/22/2018   MCV 94.3 12/22/2018   PLT 137 (L) 12/22/2018   Recent Labs    05/04/18 1035 05/04/18 1058 11/09/18 1005  NA  --  139 140  K  --  4.6 4.5  CL  --  107* 106  CO2  --  23 24  GLUCOSE  --  113* 98  BUN  --  15 14  CREATININE  --  0.92 0.86  CALCIUM  --  10.4* 10.3*  GFRNONAA  --  84 87  GFRAA  --  97 101  PROT  --   --  5.9*  ALBUMIN  --   --  4.0  AST 35  --  25  ALT 37  --  19  ALKPHOS  --   --  63  BILITOT  --   --  0.7   Iron/TIBC/Ferritin/ %Sat No results found for: IRON, TIBC, FERRITIN, IRONPCTSAT     ASSESSMENT & PLAN:  1. Lymphocytopenia   2. Hypercalcemia     I discussed with patient that the differential diagnosis of the thrombosis is broad, including infection, inflammation, alcohol,  drug and medication, nutrition deficiency, malignant etiology including underlying bone morrow disorders.  For the work up of patient's thrombocytopenia, I recommend checking CBC;CMP, LDH; smear review, folate, Vitamin B12, hepatitis, HIV,  flowcytometry and monoclonal gammopathy workup.  Also advised patient to stop taking quinine tonic water.  #Hypercalcemia, borderline, chronic.  Repeat PTH and calcium level today.  Advised patient to see if he has been taking calcium supplements.  Advised to hold calcium supplements for now.  Check multiple myeloma work-up.  # Patient follow-up with me in approximately 2 weeks to review the above results.   Orders Placed This Encounter  Procedures  . CBC with Differential/Platelet    Standing Status:   Future    Number of Occurrences:   1    Standing Expiration Date:   12/23/2019  . Parathyroid hormone,  intact and calcium    Standing Status:   Future    Number of Occurrences:   1    Standing Expiration Date:   12/22/2019  . Multiple Myeloma Panel (SPEP&IFE w/QIG)    Standing Status:   Future    Number of Occurrences:   1    Standing Expiration Date:   12/22/2019  . Kappa/lambda light chains    Standing Status:   Future    Number of Occurrences:   1    Standing Expiration Date:   12/22/2019  . Flow cytometry panel-leukemia/lymphoma work-up    Standing Status:   Future    Number of Occurrences:   1    Standing Expiration Date:   12/23/2019  . Hepatitis panel, acute    Standing Status:   Future    Number of Occurrences:   1    Standing Expiration Date:   12/23/2019  . HIV Antibody (routine testing w rflx)    Standing Status:   Future    Number of Occurrences:   1    Standing Expiration Date:   12/23/2019  . Vitamin B12    Standing Status:   Future    Number of Occurrences:   1    Standing Expiration Date:   12/23/2019  . Folate    Standing Status:   Future    Number of Occurrences:   1    Standing Expiration Date:   12/23/2019    All questions  were answered. The patient knows to call the clinic with any problems questions or concerns.  Return of visit: 3 weeks Thank you for this kind referral and the opportunity to participate in the care of this patient. A copy of today's note is routed to referring provider   Total face to face encounter time for this patient visit was 45 min. >50% of the time was  spent in counseling and coordination of care.    [name], MD, PhD Hematology Oncology [facility] Pager- [phone] 12/22/2018

## 2018-12-23 LAB — HEPATITIS PANEL, ACUTE
HCV Ab: <0.1 s/co ratio (ref 0.0–0.9)
HEP A IGM: NEGATIVE
HEP B C IGM: NEGATIVE
Hepatitis B Surface Ag: NEGATIVE

## 2018-12-23 LAB — MULTIPLE MYELOMA PANEL, SERUM
Albumin SerPl Elph-Mcnc: 3.8 g/dL (ref 2.9–4.4)
Albumin/Glob SerPl: 1.7 (ref 0.7–1.7)
Alpha 1: 0.2 g/dL (ref 0.0–0.4)
Alpha2 Glob SerPl Elph-Mcnc: 0.5 g/dL (ref 0.4–1.0)
B-GLOBULIN SERPL ELPH-MCNC: 0.7 g/dL (ref 0.7–1.3)
GLOBULIN, TOTAL: 2.3 g/dL (ref 2.2–3.9)
Gamma Glob SerPl Elph-Mcnc: 0.9 g/dL (ref 0.4–1.8)
IGG (IMMUNOGLOBIN G), SERUM: 899 mg/dL (ref 700–1600)
IgA: 160 mg/dL (ref 61–437)
IgM (Immunoglobulin M), Srm: 218 mg/dL — ABNORMAL HIGH (ref 15–143)
Total Protein ELP: 6.1 g/dL (ref 6.0–8.5)

## 2018-12-23 LAB — PTH, INTACT AND CALCIUM
Calcium, Total (PTH): 10.4 mg/dL — ABNORMAL HIGH (ref 8.6–10.2)
PTH: 44 pg/mL (ref 15–65)

## 2018-12-23 LAB — KAPPA/LAMBDA LIGHT CHAINS
Kappa free light chain: 36.6 mg/L — ABNORMAL HIGH (ref 3.3–19.4)
Kappa, lambda light chain ratio: 1.55 (ref 0.26–1.65)
LAMDA FREE LIGHT CHAINS: 23.6 mg/L (ref 5.7–26.3)

## 2018-12-23 LAB — HIV ANTIBODY (ROUTINE TESTING W REFLEX): HIV Screen 4th Generation wRfx: NONREACTIVE

## 2018-12-24 LAB — COMP PANEL: LEUKEMIA/LYMPHOMA

## 2019-01-12 ENCOUNTER — Encounter: Payer: Self-pay | Admitting: Oncology

## 2019-01-12 ENCOUNTER — Inpatient Hospital Stay: Payer: Medicare Other

## 2019-01-12 ENCOUNTER — Inpatient Hospital Stay: Payer: Medicare Other | Attending: Oncology | Admitting: Oncology

## 2019-01-12 ENCOUNTER — Other Ambulatory Visit: Payer: Self-pay

## 2019-01-12 VITALS — BP 119/67 | HR 58 | Temp 95.6°F | Wt 209.2 lb

## 2019-01-12 DIAGNOSIS — D696 Thrombocytopenia, unspecified: Secondary | ICD-10-CM | POA: Diagnosis not present

## 2019-01-12 DIAGNOSIS — Z79899 Other long term (current) drug therapy: Secondary | ICD-10-CM | POA: Diagnosis not present

## 2019-01-12 DIAGNOSIS — D72810 Lymphocytopenia: Secondary | ICD-10-CM

## 2019-01-12 DIAGNOSIS — D649 Anemia, unspecified: Secondary | ICD-10-CM | POA: Diagnosis not present

## 2019-01-12 DIAGNOSIS — D709 Neutropenia, unspecified: Secondary | ICD-10-CM | POA: Insufficient documentation

## 2019-01-12 DIAGNOSIS — F1010 Alcohol abuse, uncomplicated: Secondary | ICD-10-CM | POA: Diagnosis not present

## 2019-01-12 LAB — CBC WITH DIFFERENTIAL/PLATELET
Abs Immature Granulocytes: 0 10*3/uL (ref 0.00–0.07)
BASOS ABS: 0 10*3/uL (ref 0.0–0.1)
Basophils Relative: 0 %
Eosinophils Absolute: 0.2 10*3/uL (ref 0.0–0.5)
Eosinophils Relative: 6 %
HCT: 38.3 % — ABNORMAL LOW (ref 39.0–52.0)
Hemoglobin: 12.6 g/dL — ABNORMAL LOW (ref 13.0–17.0)
Immature Granulocytes: 0 %
Lymphocytes Relative: 29 %
Lymphs Abs: 0.9 10*3/uL (ref 0.7–4.0)
MCH: 31.1 pg (ref 26.0–34.0)
MCHC: 32.9 g/dL (ref 30.0–36.0)
MCV: 94.6 fL (ref 80.0–100.0)
Monocytes Absolute: 0.5 10*3/uL (ref 0.1–1.0)
Monocytes Relative: 16 %
NEUTROS ABS: 1.5 10*3/uL — AB (ref 1.7–7.7)
Neutrophils Relative %: 49 %
Platelets: 144 10*3/uL — ABNORMAL LOW (ref 150–400)
RBC: 4.05 MIL/uL — ABNORMAL LOW (ref 4.22–5.81)
RDW: 13.6 % (ref 11.5–15.5)
WBC: 3.1 10*3/uL — ABNORMAL LOW (ref 4.0–10.5)
nRBC: 0 % (ref 0.0–0.2)

## 2019-01-12 LAB — BASIC METABOLIC PANEL
Anion gap: 3 — ABNORMAL LOW (ref 5–15)
BUN: 18 mg/dL (ref 8–23)
CO2: 27 mmol/L (ref 22–32)
Calcium: 9.9 mg/dL (ref 8.9–10.3)
Chloride: 108 mmol/L (ref 98–111)
Creatinine, Ser: 0.89 mg/dL (ref 0.61–1.24)
GFR calc Af Amer: >60 mL/min (ref >60)
GFR calc non Af Amer: >60 mL/min (ref >60)
Glucose, Bld: 99 mg/dL (ref 70–99)
POTASSIUM: 4.6 mmol/L (ref 3.5–5.1)
Sodium: 138 mmol/L (ref 135–145)

## 2019-01-12 MED ORDER — VITAMIN B-12 1000 MCG PO TABS: 1000.0000 ug | ORAL_TABLET | Freq: Every day | ORAL | 0 refills | Status: AC

## 2019-01-12 NOTE — Progress Notes (Signed)
Patient here today for follow up.   

## 2019-01-12 NOTE — Progress Notes (Signed)
Hematology/Oncology follow-up note [facility] Telephone:(336) [phone] Fax:(336) [phone]   Patient Care Team: [name], MD as PCP - General (Family Medicine) [name], MD as Consulting Physician (Cardiology)  REFERRING PROVIDER: [name], MD  CHIEF COMPLAINTS/REASON FOR VISIT:  Follow-up for management of leukopenia and thrombocytopenia  HISTORY OF PRESENTING ILLNESS:  Shawn Shepard Sr. is a  73 y.o.  male with PMH listed below who was referred to me for evaluation of leukopenia. Reviewed patient's recent labs. Patient has low total WBC count was 2.6, predominantly absolute lymphocyte 0.6. Earlier lab lab done 11/2019 showed WBC 2.8, lymphocyte 0.7, neutrophil 1.4.  Previous lab records reviewed. 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 No aggravating or improving factors.  Associated symptoms:  denies fatigue, weight loss, fever, chills, frequent infection.  History hepatitis or HIV infection: Denies History of chronic liver disease: Denies History of blood transfusion: Denies Alcohol consumption: Denies Diet Vegetarian or Vegan: Denies Herbal medication: Denies  Reports feeling quite tired and fatigued recently.  He and his wife recently downsized to a one-story home and he has been moving a lot of furniture's by himself.  He feels it took a week for his knee to recover after the most. Denies any unintentional weight loss, fever or chills, night sweating. He is very active, walks every day. Previous occupation was a Product/process development scientist.  Drinks a cocktail every day.  Usually drinks cocktail with quinine tonic water for lower extremity cramps.  He started to do this after his ventricular tachycardic episodes about 1-1/2 years ago. Former smoker, quitting in 1972.  INTERVAL HISTORY NKOSI CORTRIGHT Sr. is a 73 y.o. male who has above history reviewed by me today presents for follow up visit for  management of leukopenia and thrombocytopenia. Problems and complaints are listed below: He has cut down cocktail drinks to 2 times a week.  Also have stopped using quinine tonic water. Clinically he has improved lately.  He is back to his baseline activity.  Walks every day. Otherwise no new complaints.   Review of Systems  Constitutional: Negative for chills, fever, malaise/fatigue and weight loss.  HENT: Negative for sore throat.   Eyes: Negative for redness.  Respiratory: Negative for cough, shortness of breath and wheezing.   Cardiovascular: Negative for chest pain, palpitations and leg swelling.  Gastrointestinal: Negative for abdominal pain, blood in stool, nausea and vomiting.  Genitourinary: Negative for dysuria.  Musculoskeletal: Negative for myalgias.  Skin: Negative for rash.  Neurological: Negative for dizziness, tingling and tremors.  Endo/Heme/Allergies: Does not bruise/bleed easily.  Psychiatric/Behavioral: Negative for hallucinations.    MEDICAL HISTORY:  Past Medical History:  Diagnosis Date  . Arthritis   . Basal cell carcinoma 1999   basal cell/skin  . CAD (coronary artery disease)   . GERD (gastroesophageal reflux disease)   . History of kidney stones 2013   passed stone on his own  . Hyperlipidemia   . Hypertension   . Shingles   . Ventricular tachycardia ([place])     SURGICAL HISTORY: Past Surgical History:  Procedure Laterality Date  . CARDIAC CATHETERIZATION N/A 01/16/2016   Procedure: Left Heart Cath and Coronary Angiography;  Surgeon: [name], MD;  Location: [place] [facility];  Service: Cardiovascular;  Laterality: N/A;  . CATARACT EXTRACTION Left 2017  . CORONARY ANGIOPLASTY  2000 and 2003  . GANGLION CYST EXCISION  1979  . KNEE ARTHROSCOPY WITH MEDIAL MENISECTOMY Left 12/01/2017  Procedure: KNEE ARTHROSCOPY WITH MEDIAL MENISECTOMY;  Surgeon: [name], MD;  Location: [facility];  Service: Orthopedics;  Laterality: Left;  Partial    . LEFT HEART CATH Right 08/07/2017   Procedure: Left Heart Cath;  Surgeon: [name], MD;  Location: [place] [facility];  Service: Cardiovascular;  Laterality: Right;  . PILONIDAL CYST EXCISION  1977  . SKIN CANCER EXCISION  1999   ear  . SYNOVECTOMY Left 12/01/2017   Procedure: SYNOVECTOMY;  Surgeon: [name], MD;  Location: [facility];  Service: Orthopedics;  Laterality: Left;  Partial  . V TACH ABLATION N/A 08/14/2017   Procedure: V Tach Ablation;  Surgeon: [name], MD;  Location: [place] [facility];  Service: Cardiovascular;  Laterality: N/A;  . VEIN SURGERY  2000   laser    SOCIAL HISTORY: Social History   Socioeconomic History  . Marital status: Married    Spouse name: Not on file  . Number of children: 2  . Years of education: Not on file  . Highest education level: Master's degree (e.g., MA, MS, MEng, MEd, MSW, MBA)  Occupational History  . Occupation: retired  Scientific laboratory technician  . Financial resource strain: Not hard at all  . Food insecurity:    Worry: Never true    Inability: Never true  . Transportation needs:    Medical: No    Non-medical: No  Tobacco Use  . Smoking status: Former Smoker    Types: Cigarettes    Last attempt to quit: 06/21/1971    Years since quitting: 47.5  . Smokeless tobacco: Former Systems developer    Types: [place] date: 06/20/1980  Substance and Sexual Activity  . Alcohol use: Yes    Alcohol/week: 2.0 standard drinks    Types: 2 Glasses of wine per week    Comment: 1-2 a week  . Drug use: No  . Sexual activity: Not on file  Lifestyle  . Physical activity:    Days per week: 5 days    Minutes per session: 30 min  . Stress: Patient refused  Relationships  . Social connections:    Talks on phone: More than three times a week    Gets together: More than three times a week    Attends religious service: More than 4 times per year    Active member of club or organization: Yes    Attends meetings of clubs or organizations:  More than 4 times per year    Relationship status: Married  . Intimate partner violence:    Fear of current or ex partner: No    Emotionally abused: No    Physically abused: No    Forced sexual activity: No  Other Topics Concern  . Not on file  Social History Narrative   Lives in [place]   Retired school principal    FAMILY HISTORY: Family History  Problem Relation Age of Onset  . Hypertension Mother   . Heart disease Mother   . Heart attack Mother   . Diabetes Mother   . Alzheimer's disease Father   . Congestive Heart Failure Brother   . Hypertension Brother   . Other Brother        dementia  . Brain cancer Daughter 37    ALLERGIES:  has No Known Allergies.  MEDICATIONS:  Current Outpatient Medications  Medication Sig Dispense Refill  . acetaminophen (TYLENOL) 500 MG tablet Take 1,000 mg by mouth every 6 (six) hours as needed  for moderate pain or headache.    Marland Kitchen aspirin EC 81 MG tablet Take 81 mg by mouth daily.    Marland Kitchen atorvastatin (LIPITOR) 80 MG tablet Take 1 tablet (80 mg total) by mouth at bedtime. 90 tablet 4  . fexofenadine (ALLEGRA) 180 MG tablet Take 1 tablet (180 mg total) by mouth daily. 90 tablet 4  . flecainide (TAMBOCOR) 100 MG tablet Take 2 tablets (200 mg total) as needed by mouth. As needed for recurrent tachycardia.  Do not repeat more than once per day or 3x per wk 2 tablet 3  . metoprolol succinate (TOPROL-XL) 25 MG 24 hr tablet Take 25 mg by mouth daily.    . naproxen sodium (ALEVE) 220 MG tablet Take 220 mg by mouth 2 (two) times daily as needed (for pain or headache).    . niacin (NIASPAN) 1000 MG CR tablet Take 1 tablet (1,000 mg total) by mouth at bedtime. 90 tablet 4  . nitroGLYCERIN (NITROSTAT) 0.4 MG SL tablet nitroglycerin 0.4 mg subl    . pantoprazole (PROTONIX) 40 MG tablet Take 1 tablet (40 mg total) by mouth daily. 90 tablet 4  . ramipril (ALTACE) 5 MG capsule Take 1 capsule (5 mg total) by mouth daily. 30 capsule 0   No current  facility-administered medications for this visit.      PHYSICAL EXAMINATION: ECOG PERFORMANCE STATUS: 0 - Asymptomatic Vitals:   01/12/19 1420  BP: 119/67  Pulse: (!) 58  Temp: (!) 95.6 F (35.3 C)   Filed Weights   01/12/19 1420  Weight: 209 lb 3 oz (94.9 kg)    Physical Exam Constitutional:      General: He is not in acute distress. HENT:     Head: Normocephalic and atraumatic.  Eyes:     General: No scleral icterus.    Pupils: Pupils are equal, round, and reactive to light.  Neck:     Musculoskeletal: Normal range of motion and neck supple.  Cardiovascular:     Rate and Rhythm: Normal rate and regular rhythm.     Heart sounds: Normal heart sounds.  Pulmonary:     Effort: Pulmonary effort is normal. No respiratory distress.     Breath sounds: No wheezing.  Abdominal:     General: Bowel sounds are normal. There is no distension.     Palpations: Abdomen is soft. There is no mass.     Tenderness: There is no abdominal tenderness.  Musculoskeletal: Normal range of motion.        General: No deformity.  Skin:    General: Skin is warm and dry.     Findings: No erythema or rash.  Neurological:     Mental Status: He is alert and oriented to person, place, and time.     Cranial Nerves: No cranial nerve deficit.     Coordination: Coordination normal.  Psychiatric:        Behavior: Behavior normal.        Thought Content: Thought content normal.      LABORATORY DATA:  I have reviewed the data as listed Lab Results  Component Value Date   WBC 3.1 (L) 01/12/2019   HGB 12.6 (L) 01/12/2019   HCT 38.3 (L) 01/12/2019   MCV 94.6 01/12/2019   PLT 144 (L) 01/12/2019   Recent Labs    05/04/18 1035  05/04/18 1058 11/09/18 1005 12/22/18 1449 01/12/19 1511  NA  --   --  139 140  --  138  K  --   --  4.6 4.5  --  4.6  CL  --   --  107* 106  --  108  CO2  --   --  23 24  --  27  GLUCOSE  --   --  113* 98  --  99  BUN  --   --  15 14  --  18  CREATININE  --   --   0.92 0.86  --  0.89  CALCIUM  --    < > 10.4* 10.3* 10.4* 9.9  GFRNONAA  --   --  84 87  --  >60  GFRAA  --   --  97 101  --  >60  PROT  --   --   --  5.9*  --   --   ALBUMIN  --   --   --  4.0  --   --   AST 35  --   --  25  --   --   ALT 37  --   --  19  --   --   ALKPHOS  --   --   --  63  --   --   BILITOT  --   --   --  0.7  --   --    < > = values in this interval not displayed.   Iron/TIBC/Ferritin/ %Sat No results found for: IRON, TIBC, FERRITIN, IRONPCTSAT     ASSESSMENT & PLAN:  1. Lymphocytopenia   2. Hypercalcemia   3. Neutropenia, unspecified type ([place])   4. Thrombocytopenia ([place])   5. Anemia, unspecified type    Labs done on 12/22/2018 reviewed and discussed with patient. Patient has mild neutropenia with ANC of 1.5 and lymphocytopenia with absolute lymphocyte 0.6.  He also have mild thrombocytopenia with a platelet count of 137. Given his history of drinking cocktails daily, and also use of quinine tonic water, I have advised patient to cut down his alcohol consumption and stop quinine tonic water.  Discussed with patient that other work-up including smear, folate, B12 level, hepatitis panel, HIV, flow cytometry, multiple myeloma panel was not remarkable. Recommend to repeat CBC today to follow-up on his counts.  If there is any improvement, will hold additional work-up at this point.  # Borderline low B12 level, advise patient to start take vitamin B12 1056mg daily. Repeat b12 level in 3 months.   Hypercalcemia, advised patient to stop calcium supplementation and improve oral hydration. Repeat BMP today to trend calcium level.  Today's labs reviewed.  Improvement of leukopenia and thrombocytopenia. Hemoglobin slightly decreased. Continue to monitor.   Likely secondary to decreased alcohol consumption and stop of quinine tonic water.    RTC in 3 months with repeat CBC and CMP and B12. Orders Placed This Encounter  Procedures  . CBC with Differential/Platelet      Standing Status:   Future    Number of Occurrences:   1    Standing Expiration Date:   01/13/2020  . Basic metabolic panel    Standing Status:   Future    Number of Occurrences:   1    Standing Expiration Date:   01/13/2020  . CBC with Differential/Platelet    Standing Status:   Future    Standing Expiration Date:   01/13/2020  . Comprehensive metabolic panel    Standing Status:   Future    Standing Expiration Date:   01/13/2020  . Vitamin B12    Standing Status:  Future    Standing Expiration Date:   01/13/2020    All questions were answered. The patient knows to call the clinic with any problems questions or concerns. We spent sufficient time to discuss many aspect of care, questions were answered to patient's satisfaction. Total face to face encounter time for this patient visit was 25 min. >50% of the time was  spent in counseling and coordination of care.  [name], MD, PhD Hematology Oncology [facility] Pager- [phone] 01/12/2019

## 2019-01-13 ENCOUNTER — Encounter: Payer: Self-pay | Admitting: *Deleted

## 2019-04-09 ENCOUNTER — Telehealth: Payer: Self-pay | Admitting: *Deleted

## 2019-04-12 ENCOUNTER — Inpatient Hospital Stay: Payer: Medicare Other | Attending: Oncology

## 2019-04-12 ENCOUNTER — Other Ambulatory Visit: Payer: Self-pay

## 2019-04-12 DIAGNOSIS — Z87442 Personal history of urinary calculi: Secondary | ICD-10-CM | POA: Diagnosis not present

## 2019-04-12 DIAGNOSIS — E785 Hyperlipidemia, unspecified: Secondary | ICD-10-CM | POA: Diagnosis not present

## 2019-04-12 DIAGNOSIS — I10 Essential (primary) hypertension: Secondary | ICD-10-CM | POA: Diagnosis not present

## 2019-04-12 DIAGNOSIS — D72810 Lymphocytopenia: Secondary | ICD-10-CM

## 2019-04-12 DIAGNOSIS — Z87891 Personal history of nicotine dependence: Secondary | ICD-10-CM | POA: Diagnosis not present

## 2019-04-12 DIAGNOSIS — Z79899 Other long term (current) drug therapy: Secondary | ICD-10-CM | POA: Insufficient documentation

## 2019-04-12 DIAGNOSIS — I2510 Atherosclerotic heart disease of native coronary artery without angina pectoris: Secondary | ICD-10-CM | POA: Insufficient documentation

## 2019-04-12 DIAGNOSIS — M1990 Unspecified osteoarthritis, unspecified site: Secondary | ICD-10-CM | POA: Insufficient documentation

## 2019-04-12 DIAGNOSIS — K219 Gastro-esophageal reflux disease without esophagitis: Secondary | ICD-10-CM | POA: Insufficient documentation

## 2019-04-12 DIAGNOSIS — Z85828 Personal history of other malignant neoplasm of skin: Secondary | ICD-10-CM | POA: Insufficient documentation

## 2019-04-12 DIAGNOSIS — Z7982 Long term (current) use of aspirin: Secondary | ICD-10-CM | POA: Diagnosis not present

## 2019-04-12 DIAGNOSIS — D709 Neutropenia, unspecified: Secondary | ICD-10-CM

## 2019-04-12 DIAGNOSIS — D696 Thrombocytopenia, unspecified: Secondary | ICD-10-CM | POA: Diagnosis present

## 2019-04-12 DIAGNOSIS — D649 Anemia, unspecified: Secondary | ICD-10-CM

## 2019-04-12 LAB — CBC WITH DIFFERENTIAL/PLATELET
Abs Immature Granulocytes: 0.01 10*3/uL (ref 0.00–0.07)
Basophils Absolute: 0 10*3/uL (ref 0.0–0.1)
Basophils Relative: 1 %
Eosinophils Absolute: 0.2 10*3/uL (ref 0.0–0.5)
Eosinophils Relative: 5 %
HCT: 40.2 % (ref 39.0–52.0)
Hemoglobin: 13.3 g/dL (ref 13.0–17.0)
Immature Granulocytes: 0 %
Lymphocytes Relative: 23 %
Lymphs Abs: 0.8 10*3/uL (ref 0.7–4.0)
MCH: 30.7 pg (ref 26.0–34.0)
MCHC: 33.1 g/dL (ref 30.0–36.0)
MCV: 92.8 fL (ref 80.0–100.0)
Monocytes Absolute: 0.5 10*3/uL (ref 0.1–1.0)
Monocytes Relative: 15 %
Neutro Abs: 1.9 10*3/uL (ref 1.7–7.7)
Neutrophils Relative %: 56 %
Platelets: 125 10*3/uL — ABNORMAL LOW (ref 150–400)
RBC: 4.33 MIL/uL (ref 4.22–5.81)
RDW: 13.7 % (ref 11.5–15.5)
WBC: 3.4 10*3/uL — ABNORMAL LOW (ref 4.0–10.5)
nRBC: 0 % (ref 0.0–0.2)

## 2019-04-12 LAB — COMPREHENSIVE METABOLIC PANEL
ALT: 19 U/L (ref 0–44)
AST: 22 U/L (ref 15–41)
Albumin: 3.7 g/dL (ref 3.5–5.0)
Alkaline Phosphatase: 63 U/L (ref 38–126)
Anion gap: 6 (ref 5–15)
BUN: 15 mg/dL (ref 8–23)
CO2: 24 mmol/L (ref 22–32)
Calcium: 10 mg/dL (ref 8.9–10.3)
Chloride: 108 mmol/L (ref 98–111)
Creatinine, Ser: 0.89 mg/dL (ref 0.61–1.24)
GFR calc Af Amer: >60 mL/min (ref >60)
GFR calc non Af Amer: >60 mL/min (ref >60)
Glucose, Bld: 99 mg/dL (ref 70–99)
Potassium: 4.4 mmol/L (ref 3.5–5.1)
Sodium: 138 mmol/L (ref 135–145)
Total Bilirubin: 0.9 mg/dL (ref 0.3–1.2)
Total Protein: 6.4 g/dL — ABNORMAL LOW (ref 6.5–8.1)

## 2019-04-12 LAB — RETIC PANEL
Immature Retic Fract: 6.7 % (ref 2.3–15.9)
RBC.: 4.33 MIL/uL (ref 4.22–5.81)
Retic Count, Absolute: 57.6 10*3/uL (ref 19.0–186.0)
Retic Ct Pct: 1.3 % (ref 0.4–3.1)
Reticulocyte Hemoglobin: 35 pg (ref >27.9)

## 2019-04-12 LAB — VITAMIN B12: Vitamin B-12: 595 pg/mL (ref 180–914)

## 2019-04-13 ENCOUNTER — Inpatient Hospital Stay: Payer: Medicare Other

## 2019-04-13 ENCOUNTER — Other Ambulatory Visit: Payer: Self-pay

## 2019-04-13 ENCOUNTER — Encounter: Payer: Self-pay | Admitting: Oncology

## 2019-04-13 ENCOUNTER — Inpatient Hospital Stay (HOSPITAL_BASED_OUTPATIENT_CLINIC_OR_DEPARTMENT_OTHER): Payer: Medicare Other | Admitting: Oncology

## 2019-04-13 DIAGNOSIS — Z79899 Other long term (current) drug therapy: Secondary | ICD-10-CM | POA: Diagnosis not present

## 2019-04-13 DIAGNOSIS — Z7982 Long term (current) use of aspirin: Secondary | ICD-10-CM

## 2019-04-13 DIAGNOSIS — D72819 Decreased white blood cell count, unspecified: Secondary | ICD-10-CM | POA: Diagnosis not present

## 2019-04-13 DIAGNOSIS — D696 Thrombocytopenia, unspecified: Secondary | ICD-10-CM

## 2019-04-13 DIAGNOSIS — Z791 Long term (current) use of non-steroidal anti-inflammatories (NSAID): Secondary | ICD-10-CM

## 2019-04-13 NOTE — Progress Notes (Signed)
HEMATOLOGY-ONCOLOGY TeleHEALTH VISIT PROGRESS NOTE  I connected with Shawn Quest Yeo Sr. on 04/13/19 at 10:15 AM EDT by video enabled telemedicine visit and verified that I am speaking with the correct person using two identifiers. I discussed the limitations, risks, security and privacy concerns of performing an evaluation and management service by telemedicine and the availability of in-person appointments. I also discussed with the patient that there may be a patient responsible charge related to this service. The patient expressed understanding and agreed to proceed.   Other persons participating in the visit and their role in the encounter:  Shawn Shepard, CMA, check in patient     Patient's location: Home  Provider's location: Home office Chief Complaint: Follow-up for management of leukopenia and thrombocytopenia.   INTERVAL HISTORY Shawn SCHRIVER Sr. is a 73 y.o. male who has above history reviewed by me today presents for follow up visit for management of leukopenia and thrombocytopenia Problems and complaints are listed below:  Patient was seen by me on 01/12/2019.  He is leukopenia/lymphocytopenia/neutropenia have been felt to be secondary to chronic alcohol use and the patient has cut down alcohol consumption since last visit. Denies any infection fever, chills.  Thrombocytopenia, stable.  Denies any bleeding events. Denies weight loss, fever, chills, fatigue, night sweats.    Review of Systems  Constitutional: Negative for appetite change, chills, fatigue, fever and unexpected weight change.  HENT:   Negative for hearing loss and voice change.   Eyes: Negative for eye problems and icterus.  Respiratory: Negative for chest tightness, cough and shortness of breath.   Cardiovascular: Negative for chest pain and leg swelling.  Gastrointestinal: Negative for abdominal distention and abdominal pain.  Endocrine: Negative for hot flashes.  Genitourinary: Negative for difficulty  urinating, dysuria and frequency.   Musculoskeletal: Negative for arthralgias.  Skin: Negative for itching and rash.  Neurological: Negative for light-headedness and numbness.  Hematological: Negative for adenopathy. Does not bruise/bleed easily.  Psychiatric/Behavioral: Negative for confusion.    Past Medical History:  Diagnosis Date  . Arthritis   . Basal cell carcinoma 1999   basal cell/skin  . CAD (coronary artery disease)   . GERD (gastroesophageal reflux disease)   . History of kidney stones 2013   passed stone on his own  . Hyperlipidemia   . Hypertension   . Shingles   . Ventricular tachycardia [facility])    Past Surgical History:  Procedure Laterality Date  . CARDIAC CATHETERIZATION N/A 01/16/2016   Procedure: Left Heart Cath and Coronary Angiography;  Surgeon: [name], MD;  Location: [place] [facility];  Service: Cardiovascular;  Laterality: N/A;  . CATARACT EXTRACTION Left 2017  . CORONARY ANGIOPLASTY  2000 and 2003  . GANGLION CYST EXCISION  1979  . KNEE ARTHROSCOPY WITH MEDIAL MENISECTOMY Left 12/01/2017   Procedure: KNEE ARTHROSCOPY WITH MEDIAL MENISECTOMY;  Surgeon: [name], MD;  Location: [facility];  Service: Orthopedics;  Laterality: Left;  Partial  . LEFT HEART CATH Right 08/07/2017   Procedure: Left Heart Cath;  Surgeon: [name], MD;  Location: [place] [facility];  Service: Cardiovascular;  Laterality: Right;  . PILONIDAL CYST EXCISION  1977  . SKIN CANCER EXCISION  1999   ear  . SYNOVECTOMY Left 12/01/2017   Procedure: SYNOVECTOMY;  Surgeon: [name], MD;  Location: [facility];  Service: Orthopedics;  Laterality: Left;  Partial  . V TACH ABLATION N/A 08/14/2017   Procedure: V Tach Ablation;  Surgeon: [name],  MD;  Location: [place] [facility];  Service: Cardiovascular;  Laterality: N/A;  . VEIN SURGERY  2000   laser    Family History  Problem Relation Age of Onset  . Hypertension Mother   . Heart disease Mother   .  Heart attack Mother   . Diabetes Mother   . Alzheimer's disease Father   . Congestive Heart Failure Brother   . Hypertension Brother   . Other Brother        dementia  . Brain cancer Daughter 76    Social History   Socioeconomic History  . Marital status: Married    Spouse name: Not on file  . Number of children: 2  . Years of education: Not on file  . Highest education level: Master's degree (e.g., MA, MS, MEng, MEd, MSW, MBA)  Occupational History  . Occupation: retired  Scientific laboratory technician  . Financial resource strain: Not hard at all  . Food insecurity:    Worry: Never true    Inability: Never true  . Transportation needs:    Medical: No    Non-medical: No  Tobacco Use  . Smoking status: Former Smoker    Types: Cigarettes    Last attempt to quit: 06/21/1971    Years since quitting: 47.8  . Smokeless tobacco: Former Systems developer    Types: [place] date: 06/20/1980  Substance and Sexual Activity  . Alcohol use: Yes    Alcohol/week: 2.0 standard drinks    Types: 2 Glasses of wine per week    Comment: 1-2 a week  . Drug use: No  . Sexual activity: Not on file  Lifestyle  . Physical activity:    Days per week: 5 days    Minutes per session: 30 min  . Stress: Patient refused  Relationships  . Social connections:    Talks on phone: More than three times a week    Gets together: More than three times a week    Attends religious service: More than 4 times per year    Active member of club or organization: Yes    Attends meetings of clubs or organizations: More than 4 times per year    Relationship status: Married  . Intimate partner violence:    Fear of current or ex partner: No    Emotionally abused: No    Physically abused: No    Forced sexual activity: No  Other Topics Concern  . Not on file  Social History Narrative   Lives in [place]   Retired school principal    Current Outpatient Medications on File Prior to Visit  Medication Sig Dispense Refill  .  acetaminophen (TYLENOL) 500 MG tablet Take 1,000 mg by mouth every 6 (six) hours as needed for moderate pain or headache.    Marland Kitchen aspirin EC 81 MG tablet Take 81 mg by mouth daily.    Marland Kitchen atorvastatin (LIPITOR) 80 MG tablet Take 1 tablet (80 mg total) by mouth at bedtime. 90 tablet 4  . fexofenadine (ALLEGRA) 180 MG tablet Take 1 tablet (180 mg total) by mouth daily. 90 tablet 4  . flecainide (TAMBOCOR) 100 MG tablet Take 2 tablets (200 mg total) as needed by mouth. As needed for recurrent tachycardia.  Do not repeat more than once per day or 3x per wk 2 tablet 3  . metoprolol succinate (TOPROL-XL) 25 MG 24 hr tablet Take 25 mg by mouth daily.    . naproxen sodium (ALEVE) 220 MG tablet Take  220 mg by mouth 2 (two) times daily as needed (for pain or headache).    . niacin (NIASPAN) 1000 MG CR tablet Take 1 tablet (1,000 mg total) by mouth at bedtime. 90 tablet 4  . nitroGLYCERIN (NITROSTAT) 0.4 MG SL tablet nitroglycerin 0.4 mg subl    . pantoprazole (PROTONIX) 40 MG tablet Take 1 tablet (40 mg total) by mouth daily. 90 tablet 4  . ramipril (ALTACE) 5 MG capsule Take 1 capsule (5 mg total) by mouth daily. 30 capsule 0  . vitamin B-12 (CYANOCOBALAMIN) 1000 MCG tablet Take 1 tablet (1,000 mcg total) by mouth daily. 90 tablet 0   No current facility-administered medications on file prior to visit.     No Known Allergies     Observations/Objective: Today's Vitals   04/13/19 1020  PainSc: 0-No pain   There is no height or weight on file to calculate BMI.  Physical Exam  Constitutional: He is oriented to person, place, and time. No distress.  HENT:  Head: Atraumatic.  Pulmonary/Chest: Effort normal.  Neurological: He is alert and oriented to person, place, and time.  Psychiatric: Affect normal.   I have personally reviewed below laboratory results. CBC    Component Value Date/Time   WBC 3.4 (L) 04/12/2019 1146   RBC 4.33 04/12/2019 1146   RBC 4.33 04/12/2019 1146   HGB 13.3 04/12/2019 1146    HGB 13.4 12/10/2018 0818   HCT 40.2 04/12/2019 1146   HCT 40.8 12/10/2018 0818   PLT 125 (L) 04/12/2019 1146   PLT 163 12/10/2018 0818   MCV 92.8 04/12/2019 1146   MCV 95 12/10/2018 0818   MCH 30.7 04/12/2019 1146   MCHC 33.1 04/12/2019 1146   RDW 13.7 04/12/2019 1146   RDW 12.8 12/10/2018 0818   LYMPHSABS 0.8 04/12/2019 1146   LYMPHSABS 0.6 (L) 12/10/2018 0818   MONOABS 0.5 04/12/2019 1146   EOSABS 0.2 04/12/2019 1146   EOSABS 0.2 12/10/2018 0818   BASOSABS 0.0 04/12/2019 1146   BASOSABS 0.0 12/10/2018 0818    CMP     Component Value Date/Time   NA 138 04/12/2019 1146   NA 140 11/09/2018 1005   K 4.4 04/12/2019 1146   CL 108 04/12/2019 1146   CO2 24 04/12/2019 1146   GLUCOSE 99 04/12/2019 1146   BUN 15 04/12/2019 1146   BUN 14 11/09/2018 1005   CREATININE 0.89 04/12/2019 1146   CALCIUM 10.0 04/12/2019 1146   CALCIUM 10.4 (H) 12/22/2018 1449   PROT 6.4 (L) 04/12/2019 1146   PROT 5.9 (L) 11/09/2018 1005   ALBUMIN 3.7 04/12/2019 1146   ALBUMIN 4.0 11/09/2018 1005   AST 22 04/12/2019 1146   AST 35 05/04/2018 1035   ALT 19 04/12/2019 1146   ALT 37 05/04/2018 1035   ALKPHOS 63 04/12/2019 1146   BILITOT 0.9 04/12/2019 1146   BILITOT 0.7 11/09/2018 1005   GFRNONAA >60 04/12/2019 1146   GFRAA >60 04/12/2019 1146     Assessment and Plan: 1. Thrombocytopenia (HCC)   2. Leukopenia, unspecified type     Labs reviewed and discussed with patient. Lymphocytopenia/neutropenia, total white count has improved to 3.4.  Neutropenia has resolved.  Lymphocytopenia has resolved.  Continue monitor.  Thrombocytopenia, etiology unknown.  Thrombocytopenia worsened.  Previous work-up includes smear, folic acid, G64 level, hepatitis panel, HIV, flow cytometry, multiple myeloma, were negative. Questionable ITP. Given patient's chronic alcohol use history, recommend patient to obtain ultrasound abdomen for further evaluation.  Anemia has resolved.  Hypercalcemia has resolved.  Follow  Up Instructions: Obtain ultrasound abdomen Lab MD assessment in 3 months. Orders Placed This Encounter  Procedures  . US Abdomen Complete    Standing Status:   Future    Standing Expiration Date:   04/12/2020    Order Specific Question:   Reason for Exam (SYMPTOM  OR DIAGNOSIS REQUIRED)    Answer:   thrombocytopenia    Order Specific Question:   Preferred imaging location?    Answer:   [facility]  . Comprehensive metabolic panel    Standing Status:   Future    Standing Expiration Date:   04/12/2020  . CBC with Differential/Platelet    Standing Status:   Future    Standing Expiration Date:   04/12/2020     I discussed the assessment and treatment plan with the patient. The patient was provided an opportunity to ask questions and all were answered. The patient agreed with the plan and demonstrated an understanding of the instructions.  The patient was advised to call back or seek an in-person evaluation if the symptoms worsen or if the condition fails to improve as anticipated.   [name], MD 04/13/2019 9:30 PM

## 2019-04-13 NOTE — Progress Notes (Signed)
Called patient today for Telehealth visit via [place].  Patient states no new concerns today

## 2019-05-11 ENCOUNTER — Encounter: Payer: Self-pay | Admitting: Family Medicine

## 2019-05-11 ENCOUNTER — Ambulatory Visit (INDEPENDENT_AMBULATORY_CARE_PROVIDER_SITE_OTHER): Payer: Medicare Other | Admitting: Family Medicine

## 2019-05-11 ENCOUNTER — Other Ambulatory Visit: Payer: Self-pay

## 2019-05-11 DIAGNOSIS — I10 Essential (primary) hypertension: Secondary | ICD-10-CM | POA: Diagnosis not present

## 2019-05-11 DIAGNOSIS — I472 Ventricular tachycardia, unspecified: Secondary | ICD-10-CM

## 2019-05-11 DIAGNOSIS — E7800 Pure hypercholesterolemia, unspecified: Secondary | ICD-10-CM

## 2019-05-11 MED ORDER — METOPROLOL SUCCINATE ER 25 MG PO TB24: 25.0000 mg | ORAL_TABLET | Freq: Every day | ORAL | 2 refills | Status: DC

## 2019-05-11 NOTE — Assessment & Plan Note (Signed)
The current medical regimen is effective;  continue present plan and medications.  

## 2019-05-11 NOTE — Progress Notes (Signed)
There were no vitals taken for this visit.   Subjective:    Patient ID: Shawn Kitchens Sr., male    DOB: [birth year], 73 y.o.   MRN: [id number]  HPI: Shawn VICTORIO Sr. is a 73 y.o. male  Med check  Telemedicine using audio/video telecommunications for a synchronous communication visit. Today's visit due to COVID-19 isolation precautions I connected with and verified that I am speaking with the correct person using two identifiers.   I discussed the limitations, risks, security and privacy concerns of performing an evaluation and management service by telecommunication and the availability of in person appointments. I also discussed with the patient that there may be a patient responsible charge related to this service. The patient expressed understanding and agreed to proceed. The patient's location is home. I am at home.  Discussed with patient all in all doing well getting good reports from hematology and possibly having a ITP work-up done in the near future. Blood pressure cholesterol tachycardia all doing well with no complaints from medications.  Relevant past medical, surgical, family and social history reviewed and updated as indicated. Interim medical history since our last visit reviewed. Allergies and medications reviewed and updated.  Review of Systems  Constitutional: Negative.   Respiratory: Negative.   Cardiovascular: Negative.     Per HPI unless specifically indicated above     Objective:    There were no vitals taken for this visit.  Wt Readings from Last 3 Encounters:  01/12/19 209 lb 3 oz (94.9 kg)  12/22/18 213 lb (96.6 kg)  11/09/18 212 lb (96.2 kg)    Physical Exam  Results for orders placed or performed in visit on 04/12/19  Retic Panel  Result Value Ref Range   Retic Ct Pct 1.3 0.4 - 3.1 %   RBC. 4.33 4.22 - 5.81 MIL/uL   Retic Count, Absolute 57.6 19.0 - 186.0 K/uL   Immature Retic Fract 6.7 2.3 - 15.9 %   Reticulocyte Hemoglobin 35.0  >27.9 pg  Vitamin B12  Result Value Ref Range   Vitamin B-12 595 180 - 914 pg/mL  Comprehensive metabolic panel  Result Value Ref Range   Sodium 138 135 - 145 mmol/L   Potassium 4.4 3.5 - 5.1 mmol/L   Chloride 108 98 - 111 mmol/L   CO2 24 22 - 32 mmol/L   Glucose, Bld 99 70 - 99 mg/dL   BUN 15 8 - 23 mg/dL   Creatinine, Ser 0.89 0.61 - 1.24 mg/dL   Calcium 10.0 8.9 - 10.3 mg/dL   Total Protein 6.4 (L) 6.5 - 8.1 g/dL   Albumin 3.7 3.5 - 5.0 g/dL   AST 22 15 - 41 U/L   ALT 19 0 - 44 U/L   Alkaline Phosphatase 63 38 - 126 U/L   Total Bilirubin 0.9 0.3 - 1.2 mg/dL   GFR calc non Af Amer >60 >60 mL/min   GFR calc Af Amer >60 >60 mL/min   Anion gap 6 5 - 15  CBC with Differential/Platelet  Result Value Ref Range   WBC 3.4 (L) 4.0 - 10.5 K/uL   RBC 4.33 4.22 - 5.81 MIL/uL   Hemoglobin 13.3 13.0 - 17.0 g/dL   HCT 40.2 39.0 - 52.0 %   MCV 92.8 80.0 - 100.0 fL   MCH 30.7 26.0 - 34.0 pg   MCHC 33.1 30.0 - 36.0 g/dL   RDW 13.7 11.5 - 15.5 %   Platelets 125 (L) 150 - 400 K/uL  nRBC 0.0 0.0 - 0.2 %   Neutrophils Relative % 56 %   Neutro Abs 1.9 1.7 - 7.7 K/uL   Lymphocytes Relative 23 %   Lymphs Abs 0.8 0.7 - 4.0 K/uL   Monocytes Relative 15 %   Monocytes Absolute 0.5 0.1 - 1.0 K/uL   Eosinophils Relative 5 %   Eosinophils Absolute 0.2 0.0 - 0.5 K/uL   Basophils Relative 1 %   Basophils Absolute 0.0 0.0 - 0.1 K/uL   Immature Granulocytes 0 %   Abs Immature Granulocytes 0.01 0.00 - 0.07 K/uL      Assessment & Plan:   Problem List Items Addressed This Visit      Cardiovascular and Mediastinum   Hypertension    The current medical regimen is effective;  continue present plan and medications.       Relevant Medications   metoprolol succinate (TOPROL-XL) 25 MG 24 hr tablet   Ventricular tachycardia (HCC)    The current medical regimen is effective;  continue present plan and medications.       Relevant Medications   metoprolol succinate (TOPROL-XL) 25 MG 24 hr tablet      Other   Hyperlipidemia    The current medical regimen is effective;  continue present plan and medications.       Relevant Medications   metoprolol succinate (TOPROL-XL) 25 MG 24 hr tablet       I discussed the assessment and treatment plan with the patient. The patient was provided an opportunity to ask questions and all were answered. The patient agreed with the plan and demonstrated an understanding of the instructions.   The patient was advised to call back or seek an in-person evaluation if the symptoms worsen or if the condition fails to improve as anticipated.   I provided 21+ minutes of time during this encounter. Follow up plan: Return in about 6 months (around 11/10/2019) for Physical Exam.

## 2019-05-31 ENCOUNTER — Other Ambulatory Visit: Payer: Medicare Other

## 2019-05-31 ENCOUNTER — Other Ambulatory Visit: Payer: Self-pay | Admitting: Family Medicine

## 2019-05-31 ENCOUNTER — Other Ambulatory Visit: Payer: Self-pay

## 2019-05-31 ENCOUNTER — Encounter: Payer: Self-pay | Admitting: Family Medicine

## 2019-05-31 DIAGNOSIS — E7800 Pure hypercholesterolemia, unspecified: Secondary | ICD-10-CM

## 2019-05-31 DIAGNOSIS — Z0000 Encounter for general adult medical examination without abnormal findings: Secondary | ICD-10-CM

## 2019-05-31 DIAGNOSIS — I10 Essential (primary) hypertension: Secondary | ICD-10-CM

## 2019-05-31 LAB — LP+ALT+AST PICCOLO, WAIVED
ALT (SGPT) Piccolo, Waived: 24 U/L (ref 10–47)
AST (SGOT) Piccolo, Waived: 32 U/L (ref 11–38)
Chol/HDL Ratio Piccolo,Waive: 2.2 mg/dL
Cholesterol Piccolo, Waived: 136 mg/dL (ref <200)
HDL Chol Piccolo, Waived: 61 mg/dL (ref >59)
LDL Chol Calc Piccolo Waived: 57 mg/dL (ref <100)
Triglycerides Piccolo,Waived: 87 mg/dL (ref <150)
VLDL Chol Calc Piccolo,Waive: 17 mg/dL (ref <30)

## 2019-06-01 LAB — BASIC METABOLIC PANEL
BUN/Creatinine Ratio: 17 (ref 10–24)
BUN: 15 mg/dL (ref 8–27)
CO2: 24 mmol/L (ref 20–29)
Calcium: 10.4 mg/dL — ABNORMAL HIGH (ref 8.6–10.2)
Chloride: 102 mmol/L (ref 96–106)
Creatinine, Ser: 0.87 mg/dL (ref 0.76–1.27)
GFR calc Af Amer: 100 mL/min/{1.73_m2} (ref >59)
GFR calc non Af Amer: 87 mL/min/{1.73_m2} (ref >59)
Glucose: 104 mg/dL — ABNORMAL HIGH (ref 65–99)
Potassium: 4.8 mmol/L (ref 3.5–5.2)
Sodium: 137 mmol/L (ref 134–144)

## 2019-06-07 ENCOUNTER — Ambulatory Visit
Admission: RE | Admit: 2019-06-07 | Discharge: 2019-06-07 | Disposition: A | Discharge status: Home / Self Care | Payer: Medicare Other | Source: Ambulatory Visit | Attending: Oncology | Admitting: Oncology

## 2019-06-07 ENCOUNTER — Other Ambulatory Visit: Payer: Self-pay

## 2019-06-07 DIAGNOSIS — D696 Thrombocytopenia, unspecified: Secondary | ICD-10-CM

## 2019-06-12 LAB — BASIC METABOLIC PANEL

## 2019-06-12 LAB — FECAL OCCULT BLOOD, IMMUNOCHEMICAL: Fecal Occult Bld: NEGATIVE

## 2019-06-14 ENCOUNTER — Other Ambulatory Visit: Payer: Self-pay

## 2019-06-14 ENCOUNTER — Ambulatory Visit
Admission: RE | Admit: 2019-06-14 | Discharge: 2019-06-14 | Disposition: A | Discharge status: Home / Self Care | Payer: Medicare Other | Source: Ambulatory Visit | Attending: Oncology | Admitting: Oncology

## 2019-06-14 DIAGNOSIS — D696 Thrombocytopenia, unspecified: Secondary | ICD-10-CM | POA: Diagnosis not present

## 2019-07-12 ENCOUNTER — Inpatient Hospital Stay (HOSPITAL_BASED_OUTPATIENT_CLINIC_OR_DEPARTMENT_OTHER): Payer: Medicare Other | Admitting: Oncology

## 2019-07-12 ENCOUNTER — Encounter: Payer: Self-pay | Admitting: Oncology

## 2019-07-12 ENCOUNTER — Other Ambulatory Visit: Payer: Self-pay

## 2019-07-12 ENCOUNTER — Inpatient Hospital Stay: Payer: Medicare Other | Attending: Oncology

## 2019-07-12 VITALS — BP 152/80 | HR 50 | Temp 96.5°F | Resp 18 | Wt 207.4 lb

## 2019-07-12 DIAGNOSIS — D709 Neutropenia, unspecified: Secondary | ICD-10-CM | POA: Diagnosis not present

## 2019-07-12 DIAGNOSIS — K219 Gastro-esophageal reflux disease without esophagitis: Secondary | ICD-10-CM | POA: Diagnosis not present

## 2019-07-12 DIAGNOSIS — D72819 Decreased white blood cell count, unspecified: Secondary | ICD-10-CM

## 2019-07-12 DIAGNOSIS — E785 Hyperlipidemia, unspecified: Secondary | ICD-10-CM | POA: Diagnosis not present

## 2019-07-12 DIAGNOSIS — Z87891 Personal history of nicotine dependence: Secondary | ICD-10-CM | POA: Insufficient documentation

## 2019-07-12 DIAGNOSIS — D696 Thrombocytopenia, unspecified: Secondary | ICD-10-CM | POA: Insufficient documentation

## 2019-07-12 DIAGNOSIS — Z7982 Long term (current) use of aspirin: Secondary | ICD-10-CM | POA: Insufficient documentation

## 2019-07-12 DIAGNOSIS — I2510 Atherosclerotic heart disease of native coronary artery without angina pectoris: Secondary | ICD-10-CM | POA: Diagnosis not present

## 2019-07-12 DIAGNOSIS — M1990 Unspecified osteoarthritis, unspecified site: Secondary | ICD-10-CM | POA: Diagnosis not present

## 2019-07-12 DIAGNOSIS — Z85828 Personal history of other malignant neoplasm of skin: Secondary | ICD-10-CM | POA: Diagnosis not present

## 2019-07-12 DIAGNOSIS — Z79899 Other long term (current) drug therapy: Secondary | ICD-10-CM | POA: Diagnosis not present

## 2019-07-12 DIAGNOSIS — I10 Essential (primary) hypertension: Secondary | ICD-10-CM | POA: Insufficient documentation

## 2019-07-12 LAB — CBC WITH DIFFERENTIAL/PLATELET
Abs Immature Granulocytes: 0.01 10*3/uL (ref 0.00–0.07)
Basophils Absolute: 0 10*3/uL (ref 0.0–0.1)
Basophils Relative: 1 %
Eosinophils Absolute: 0.2 10*3/uL (ref 0.0–0.5)
Eosinophils Relative: 6 %
HCT: 39.5 % (ref 39.0–52.0)
Hemoglobin: 13.2 g/dL (ref 13.0–17.0)
Immature Granulocytes: 0 %
Lymphocytes Relative: 24 %
Lymphs Abs: 0.6 10*3/uL — ABNORMAL LOW (ref 0.7–4.0)
MCH: 31.1 pg (ref 26.0–34.0)
MCHC: 33.4 g/dL (ref 30.0–36.0)
MCV: 93.2 fL (ref 80.0–100.0)
Monocytes Absolute: 0.4 10*3/uL (ref 0.1–1.0)
Monocytes Relative: 16 %
Neutro Abs: 1.4 10*3/uL — ABNORMAL LOW (ref 1.7–7.7)
Neutrophils Relative %: 53 %
Platelets: 155 10*3/uL (ref 150–400)
RBC: 4.24 MIL/uL (ref 4.22–5.81)
RDW: 14.1 % (ref 11.5–15.5)
WBC: 2.6 10*3/uL — ABNORMAL LOW (ref 4.0–10.5)
nRBC: 0 % (ref 0.0–0.2)

## 2019-07-12 LAB — COMPREHENSIVE METABOLIC PANEL
ALT: 20 U/L (ref 0–44)
AST: 22 U/L (ref 15–41)
Albumin: 3.7 g/dL (ref 3.5–5.0)
Alkaline Phosphatase: 66 U/L (ref 38–126)
Anion gap: 5 (ref 5–15)
BUN: 14 mg/dL (ref 8–23)
CO2: 27 mmol/L (ref 22–32)
Calcium: 10 mg/dL (ref 8.9–10.3)
Chloride: 108 mmol/L (ref 98–111)
Creatinine, Ser: 0.89 mg/dL (ref 0.61–1.24)
GFR calc Af Amer: >60 mL/min (ref >60)
GFR calc non Af Amer: >60 mL/min (ref >60)
Glucose, Bld: 107 mg/dL — ABNORMAL HIGH (ref 70–99)
Potassium: 4.6 mmol/L (ref 3.5–5.1)
Sodium: 140 mmol/L (ref 135–145)
Total Bilirubin: 1.2 mg/dL (ref 0.3–1.2)
Total Protein: 6.5 g/dL (ref 6.5–8.1)

## 2019-07-12 LAB — IMMATURE PLATELET FRACTION: Immature Platelet Fraction: 2.2 % (ref 1.2–8.6)

## 2019-07-12 NOTE — Progress Notes (Signed)
Hematology/Oncology follow-up note [facility] [phone] [phone]   Patient Care Team: [name], MD as PCP - General (Family Medicine) Shawn David, MD as Consulting Physician (Cardiology)  REFERRING PROVIDER: [name], MD  CHIEF COMPLAINTS/REASON FOR VISIT:  Follow-up for management of leukopenia and thrombocytopenia  HISTORY OF PRESENTING ILLNESS:  Shawn LINA Sr. is a  73 y.o.  male with PMH listed below who was referred to me for evaluation of leukopenia. Reviewed patient's recent labs. Patient has low total WBC count was 2.6, predominantly absolute lymphocyte 0.6. Earlier lab lab done 11/2019 showed WBC 2.8, lymphocyte 0.7, neutrophil 1.4.  Previous lab records reviewed. 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 No aggravating or improving factors.  Associated symptoms:  denies fatigue, weight loss, fever, chills, frequent infection.  History hepatitis or HIV infection: Denies History of chronic liver disease: Denies History of blood transfusion: Denies Alcohol consumption: Denies Diet Vegetarian or Vegan: Denies Herbal medication: Denies  Reports feeling quite tired and fatigued recently.  He and his wife recently downsized to a one-story home and he has been moving a lot of furniture's by himself.  He feels it took a week for his knee to recover after the most. Denies any unintentional weight loss, fever or chills, night sweating. He is very active, walks every day. Previous occupation was a Product/process development scientist.  Drinks a cocktail every day.  Usually drinks cocktail with quinine tonic water for lower extremity cramps.  He started to do this after his ventricular tachycardic episodes about 1-1/2 years ago. Former smoker, quitting in 1972.  INTERVAL HISTORY Shawn MCMAHILL Sr. is a 73 y.o. male who has above history reviewed by me today presents for follow up visit for  management of leukopenia and thrombocytopenia. Problems and complaints are listed below: Patient reports that he has stopped drinking alcohol completely, also not using quinine with tonic water. Clinically he feels well. Denies any fever, unintentional weight loss, night sweating. He feels cold most of the time, no exacerbating or alleviating factors.  He exercises, walking daily.  Appetite is good.  He tries to lose weight healthily.  Lost 2 pounds since 6 months ago. Feels like energy is left base level.  No new complaints .  Review of Systems  Constitutional: Negative for chills, fever, malaise/fatigue and weight loss.  HENT: Negative for sore throat.   Eyes: Negative for redness.  Respiratory: Negative for cough, shortness of breath and wheezing.   Cardiovascular: Negative for chest pain, palpitations and leg swelling.  Gastrointestinal: Negative for abdominal pain, blood in stool, nausea and vomiting.  Genitourinary: Negative for dysuria.  Musculoskeletal: Negative for myalgias.  Skin: Negative for rash.  Neurological: Negative for dizziness, tingling and tremors.  Endo/Heme/Allergies: Does not bruise/bleed easily.  Psychiatric/Behavioral: Negative for hallucinations.    MEDICAL HISTORY:  Past Medical History:  Diagnosis Date  . Arthritis   . Basal cell carcinoma 1999   basal cell/skin  . CAD (coronary artery disease)   . GERD (gastroesophageal reflux disease)   . History of kidney stones 2013   passed stone on his own  . Hyperlipidemia   . Hypertension   . Shingles   . Ventricular tachycardia ([place])     SURGICAL HISTORY: Past Surgical History:  Procedure Laterality Date  . CARDIAC CATHETERIZATION N/A 01/16/2016   Procedure: Left Heart Cath and Coronary Angiography;  Surgeon: Shawn David, MD;  Location: [facility];  Service: Cardiovascular;  Laterality: N/A;  . CATARACT EXTRACTION Left 2017  . CORONARY ANGIOPLASTY  2000 and 2003  . GANGLION CYST  EXCISION  1979  . KNEE ARTHROSCOPY WITH MEDIAL MENISECTOMY Left 12/01/2017   Procedure: KNEE ARTHROSCOPY WITH MEDIAL MENISECTOMY;  Surgeon: [name], MD;  Location: [facility];  Service: Orthopedics;  Laterality: Left;  Partial  . LEFT HEART CATH Right 08/07/2017   Procedure: Left Heart Cath;  Surgeon: Shawn David, MD;  Location: [facility];  Service: Cardiovascular;  Laterality: Right;  . PILONIDAL CYST EXCISION  1977  . SKIN CANCER EXCISION  1999   ear  . SYNOVECTOMY Left 12/01/2017   Procedure: SYNOVECTOMY;  Surgeon: [name], MD;  Location: [facility];  Service: Orthopedics;  Laterality: Left;  Partial  . V TACH ABLATION N/A 08/14/2017   Procedure: V Tach Ablation;  Surgeon: [name], MD;  Location: [place] [facility];  Service: Cardiovascular;  Laterality: N/A;  . VEIN SURGERY  2000   laser    SOCIAL HISTORY: Social History   Socioeconomic History  . Marital status: Married    Spouse name: Not on file  . Number of children: 2  . Years of education: Not on file  . Highest education level: Master's degree (e.g., MA, MS, MEng, MEd, MSW, MBA)  Occupational History  . Occupation: retired  Scientific laboratory technician  . Financial resource strain: Not hard at all  . Food insecurity    Worry: Never true    Inability: Never true  . Transportation needs    Medical: No    Non-medical: No  Tobacco Use  . Smoking status: Former Smoker    Types: Cigarettes    Quit date: 06/21/1971    Years since quitting: 48.0  . Smokeless tobacco: Former Systems developer    Types: [place] date: 06/20/1980  Substance and Sexual Activity  . Alcohol use: Yes    Alcohol/week: 2.0 standard drinks    Types: 2 Glasses of wine per week    Comment: 1-2 a week  . Drug use: No  . Sexual activity: Not on file  Lifestyle  . Physical activity    Days per week: 5 days    Minutes per session: 30 min  . Stress: Patient refused  Relationships  . Social connections    Talks on phone: More than  three times a week    Gets together: More than three times a week    Attends religious service: More than 4 times per year    Active member of club or organization: Yes    Attends meetings of clubs or organizations: More than 4 times per year    Relationship status: Married  . Intimate partner violence    Fear of current or ex partner: No    Emotionally abused: No    Physically abused: No    Forced sexual activity: No  Other Topics Concern  . Not on file  Social History Narrative   Lives in [place]   Retired school principal    FAMILY HISTORY: Family History  Problem Relation Age of Onset  . Hypertension Mother   . Heart disease Mother   . Heart attack Mother   . Diabetes Mother   . Alzheimer's disease Father   . Congestive Heart Failure Brother   . Hypertension Brother   . Other Brother        dementia  . Brain cancer Daughter 75    ALLERGIES:  has No  Known Allergies.  MEDICATIONS:  Current Outpatient Medications  Medication Sig Dispense Refill  . acetaminophen (TYLENOL) 500 MG tablet Take 1,000 mg by mouth every 6 (six) hours as needed for moderate pain or headache.    Marland Kitchen aspirin EC 81 MG tablet Take 81 mg by mouth daily.    Marland Kitchen atorvastatin (LIPITOR) 80 MG tablet Take 1 tablet (80 mg total) by mouth at bedtime. 90 tablet 4  . fexofenadine (ALLEGRA) 180 MG tablet Take 1 tablet (180 mg total) by mouth daily. 90 tablet 4  . flecainide (TAMBOCOR) 100 MG tablet Take 2 tablets (200 mg total) as needed by mouth. As needed for recurrent tachycardia.  Do not repeat more than once per day or 3x per wk 2 tablet 3  . metoprolol succinate (TOPROL-XL) 25 MG 24 hr tablet Take 1 tablet (25 mg total) by mouth daily. 90 tablet 2  . naproxen sodium (ALEVE) 220 MG tablet Take 220 mg by mouth 2 (two) times daily as needed (for pain or headache).    . niacin (NIASPAN) 1000 MG CR tablet Take 1 tablet (1,000 mg total) by mouth at bedtime. 90 tablet 4  . nitroGLYCERIN (NITROSTAT) 0.4 MG SL  tablet nitroglycerin 0.4 mg subl    . pantoprazole (PROTONIX) 40 MG tablet Take 1 tablet (40 mg total) by mouth daily. 90 tablet 4  . ramipril (ALTACE) 5 MG capsule Take 1 capsule (5 mg total) by mouth daily. 30 capsule 0  . vitamin B-12 (CYANOCOBALAMIN) 1000 MCG tablet Take 1 tablet (1,000 mcg total) by mouth daily. 90 tablet 0   No current facility-administered medications for this visit.      PHYSICAL EXAMINATION: ECOG PERFORMANCE STATUS: 0 - Asymptomatic Vitals:   07/12/19 0930  BP: (!) 152/80  Pulse: (!) 50  Resp: 18  Temp: (!) 96.5 F (35.8 C)   Filed Weights   07/12/19 0930  Weight: 207 lb 6.4 oz (94.1 kg)    Physical Exam Constitutional:      General: He is not in acute distress. HENT:     Head: Normocephalic and atraumatic.  Eyes:     General: No scleral icterus.    Pupils: Pupils are equal, round, and reactive to light.  Neck:     Musculoskeletal: Normal range of motion and neck supple.  Cardiovascular:     Rate and Rhythm: Normal rate and regular rhythm.     Heart sounds: Normal heart sounds.  Pulmonary:     Effort: Pulmonary effort is normal. No respiratory distress.     Breath sounds: No wheezing.  Abdominal:     General: Bowel sounds are normal. There is no distension.     Palpations: Abdomen is soft. There is no mass.     Tenderness: There is no abdominal tenderness.  Musculoskeletal: Normal range of motion.        General: No deformity.  Skin:    General: Skin is warm and dry.     Findings: No erythema or rash.  Neurological:     Mental Status: He is alert and oriented to person, place, and time.     Cranial Nerves: No cranial nerve deficit.     Coordination: Coordination normal.  Psychiatric:        Behavior: Behavior normal.        Thought Content: Thought content normal.      LABORATORY DATA:  I have reviewed the data as listed Lab Results  Component Value Date   WBC 2.6 (L) 07/12/2019  HGB 13.2 07/12/2019   HCT 39.5 07/12/2019    MCV 93.2 07/12/2019   PLT 155 07/12/2019   Recent Labs    11/09/18 1005  01/12/19 1511 04/12/19 1146 05/31/19 0852 05/31/19 0856 06/02/19 0852  NA 140  --  138 138  --  137  --   K 4.5  --  4.6 4.4  --  4.8  --   CL 106  --  108 108  --  102  --   CO2 24  --  27 24  --  24  --   GLUCOSE 98   < > 99 99  --  104* CANCELED  BUN 14  --  18 15  --  15  --   CREATININE 0.86  --  0.89 0.89  --  0.87  --   CALCIUM 10.3*   < > 9.9 10.0  --  10.4*  --   GFRNONAA 87  --  >60 >60  --  87  --   GFRAA 101  --  >60 >60  --  100  --   PROT 5.9*  --   --  6.4*  --   --   --   ALBUMIN 4.0  --   --  3.7  --   --   --   AST 25  --   --  22 32  --   --   ALT 19  --   --  19 24  --   --   ALKPHOS 63  --   --  63  --   --   --   BILITOT 0.7  --   --  0.9  --   --   --    < > = values in this interval not displayed.   Iron/TIBC/Ferritin/ %Sat No results found for: IRON, TIBC, FERRITIN, IRONPCTSAT     ASSESSMENT & PLAN:  1. Leukopenia, unspecified type   2. Thrombocytopenia ([place])   3. Hypercalcemia   4. Neutropenia, unspecified type ([place])    Chronic leukopenia, predominantly neutropenia Patient has had laboratory work-up in the past.  Including normal smear, normal folate level, normal B12 level, hepatitis negative, negative HIV, negative flow cytometry.  Multiple myeloma panel was not remarkable except slightly increased IgM level. Leukopenia/neutropenia was felt to be secondary to chronic alcohol use.  Today's lab work was reviewed and discussed with patient. He has worsened leukocytopenia and neutropenia.   has an ANC of 1.4, white count 2.6. No frequent infections.  No fever today. I recommend patient to repeat blood work in 2 weeks.  I I would repeat CBC, repeat smear, recheck B12 level and multiple myeloma panel. If persistently low white blood cell count/neutropenia or even worse, I would recommend proceed with bone marrow biopsy for further evaluation. Patient agrees with the plan.   #Thrombocytopenia, his platelet level is 155,000, normalized.  Continue to monitor.  # Borderline low B12 level, continue B12 1062mg daily. Repeat b12 level     Hypercalcemia, calcium level normalized.  RTC in 3 months with repeat CBC and CMP and B12. Orders Placed This Encounter  Procedures  . CBC with Differential/Platelet    Standing Status:   Future    Standing Expiration Date:   07/11/2020  . Technologist smear review    Standing Status:   Future    Standing Expiration Date:   07/11/2020  . Multiple Myeloma Panel (SPEP&IFE w/QIG)    Standing Status:   Future  Standing Expiration Date:   07/11/2020  . Kappa/lambda light chains    Standing Status:   Future    Standing Expiration Date:   07/11/2020  . UPEP/UIFE/Light Chains/TP, 24-Hr Ur    Standing Status:   Future    Standing Expiration Date:   07/11/2020  . TSH    Standing Status:   Future    Standing Expiration Date:   07/11/2020  . Vitamin B12    Standing Status:   Future    Standing Expiration Date:   07/11/2020  . Folate    Standing Status:   Future    Standing Expiration Date:   07/11/2020    We spent sufficient time to discuss many aspect of care, questions were answered to patient's satisfaction. Total face to face encounter time for this patient visit was 25 min. >50% of the time was  spent in counseling and coordination of care.   [name], MD, PhD  07/12/2019

## 2019-07-26 ENCOUNTER — Other Ambulatory Visit: Payer: Self-pay

## 2019-07-26 ENCOUNTER — Inpatient Hospital Stay: Payer: Medicare Other

## 2019-07-26 DIAGNOSIS — D72819 Decreased white blood cell count, unspecified: Secondary | ICD-10-CM

## 2019-07-26 LAB — CBC WITH DIFFERENTIAL/PLATELET
Abs Immature Granulocytes: 0.01 10*3/uL (ref 0.00–0.07)
Basophils Absolute: 0 10*3/uL (ref 0.0–0.1)
Basophils Relative: 1 %
Eosinophils Absolute: 0.2 10*3/uL (ref 0.0–0.5)
Eosinophils Relative: 5 %
HCT: 40.6 % (ref 39.0–52.0)
Hemoglobin: 13.2 g/dL (ref 13.0–17.0)
Immature Granulocytes: 0 %
Lymphocytes Relative: 22 %
Lymphs Abs: 0.7 10*3/uL (ref 0.7–4.0)
MCH: 30.7 pg (ref 26.0–34.0)
MCHC: 32.5 g/dL (ref 30.0–36.0)
MCV: 94.4 fL (ref 80.0–100.0)
Monocytes Absolute: 0.5 10*3/uL (ref 0.1–1.0)
Monocytes Relative: 14 %
Neutro Abs: 1.9 10*3/uL (ref 1.7–7.7)
Neutrophils Relative %: 58 %
Platelets: 135 10*3/uL — ABNORMAL LOW (ref 150–400)
RBC: 4.3 MIL/uL (ref 4.22–5.81)
RDW: 13.9 % (ref 11.5–15.5)
WBC: 3.2 10*3/uL — ABNORMAL LOW (ref 4.0–10.5)
nRBC: 0 % (ref 0.0–0.2)

## 2019-07-26 LAB — TECHNOLOGIST SMEAR REVIEW

## 2019-07-26 LAB — FOLATE: Folate: 14 ng/mL

## 2019-07-26 LAB — TSH: TSH: 0.811 u[IU]/mL (ref 0.350–4.500)

## 2019-07-26 LAB — VITAMIN B12: Vitamin B-12: 678 pg/mL (ref 180–914)

## 2019-07-27 DIAGNOSIS — D72819 Decreased white blood cell count, unspecified: Secondary | ICD-10-CM | POA: Diagnosis not present

## 2019-07-27 LAB — MULTIPLE MYELOMA PANEL, SERUM
Albumin SerPl Elph-Mcnc: 3.7 g/dL (ref 2.9–4.4)
Albumin/Glob SerPl: 1.5 (ref 0.7–1.7)
Alpha 1: 0.2 g/dL (ref 0.0–0.4)
Alpha2 Glob SerPl Elph-Mcnc: 0.7 g/dL (ref 0.4–1.0)
B-Globulin SerPl Elph-Mcnc: 0.7 g/dL (ref 0.7–1.3)
Gamma Glob SerPl Elph-Mcnc: 1 g/dL (ref 0.4–1.8)
Globulin, Total: 2.6 g/dL (ref 2.2–3.9)
IgA: 164 mg/dL (ref 61–437)
IgG (Immunoglobin G), Serum: 930 mg/dL (ref 603–1613)
IgM (Immunoglobulin M), Srm: 244 mg/dL — ABNORMAL HIGH (ref 15–143)
Total Protein ELP: 6.3 g/dL (ref 6.0–8.5)

## 2019-07-27 LAB — KAPPA/LAMBDA LIGHT CHAINS
Kappa free light chain: 39.6 mg/L — ABNORMAL HIGH (ref 3.3–19.4)
Kappa, lambda light chain ratio: 1.35 (ref 0.26–1.65)
Lambda free light chains: 29.4 mg/L — ABNORMAL HIGH (ref 5.7–26.3)

## 2019-07-28 ENCOUNTER — Other Ambulatory Visit: Payer: Self-pay

## 2019-07-28 DIAGNOSIS — D72819 Decreased white blood cell count, unspecified: Secondary | ICD-10-CM

## 2019-07-29 LAB — UPEP/UIFE/LIGHT CHAINS/TP, 24-HR UR
% BETA, Urine: 0 %
ALPHA 1 URINE: 0 %
Albumin, U: 0 %
Alpha 2, Urine: 0 %
Free Kappa Lt Chains,Ur: 14.48 mg/L (ref 0.63–113.79)
Free Kappa/Lambda Ratio: 21.29 (ref 1.03–31.76)
Free Lambda Lt Chains,Ur: 0.68 mg/L (ref 0.47–11.77)
GAMMA GLOBULIN URINE: 0 %
Total Protein, Urine-Ur/day: 58 mg/24 hr (ref 30–150)
Total Protein, Urine: 6.1 mg/dL
Total Volume: 950

## 2019-10-07 ENCOUNTER — Ambulatory Visit (INDEPENDENT_AMBULATORY_CARE_PROVIDER_SITE_OTHER): Payer: Medicare Other

## 2019-10-07 VITALS — BP 132/79 | HR 66 | Ht 73.0 in | Wt 202.0 lb

## 2019-10-07 DIAGNOSIS — Z0000 Encounter for general adult medical examination without abnormal findings: Secondary | ICD-10-CM

## 2019-10-07 NOTE — Patient Instructions (Signed)
[name] , Thank you for taking time to come for yourMedicare Wellness Visit. I appreciate your ongoing commitment to your health goals. Please review the following plan we discussed and let me know if I can assist you in the future.   Screening recommendations/referrals: Colonoscopy: completed 02/14/2009 Recommended yearly ophthalmology/optometry visit for glaucoma screening and checkup Recommended yearly dental visit for hygiene and checkup  Vaccinations: Influenza vaccine: up to date Pneumococcal vaccine: up to date Tdap vaccine: up to date Shingles vaccine: shingrix eligible   Advanced directives: copy on file   Conditions/risks identified: none   Next appointment: Follow up in one year for your annual wellness visit.   Preventive Care 14 Years and Older, Male Preventive care refers to lifestyle choices and visits with your health care provider that can promote health and wellness. What does preventive care include?  A yearly physical exam. This is also called an annual well check.  Dental exams once or twice a year.  Routine eye exams. Ask your health care provider how often you should have your eyes checked.  Personal lifestyle choices, including:  Daily care of your teeth and gums.  Regular physical activity.  Eating a healthy diet.  Avoiding tobacco and drug use.  Limiting alcohol use.  Practicing safe sex.  Taking low doses of aspirin every day.  Taking vitamin and mineral supplements as recommended by your health care provider. What happens during an annual well check? The services and screenings done by your health care provider during your annual well check will depend on your age, overall health, lifestyle risk factors, and family history of disease. Counseling  Your health care provider may ask you questions about your:  Alcohol use.  Tobacco use.  Drug use.  Emotional well-being.  Home and relationship well-being.  Sexual  activity.  Eating habits.  History of falls.  Memory and ability to understand (cognition).  Work and work Statistician. Screening  You may have the following tests or measurements:  Height, weight, and BMI.  Blood pressure.  Lipid and cholesterol levels. These may be checked every 5 years, or more frequently if you are over 31 years old.  Skin check.  Lung cancer screening. You may have this screening every year starting at age 84 if you have a 30-pack-year history of smoking and currently smoke or have quit within the past 15 years.  Fecal occult blood test (FOBT) of the stool. You may have this test every year starting at age 76.  Flexible sigmoidoscopy or colonoscopy. You may have a sigmoidoscopy every 5 years or a colonoscopy every 10 years starting at age 81.  Prostate cancer screening. Recommendations will vary depending on your family history and other risks.  Hepatitis C blood test.  Hepatitis B blood test.  Sexually transmitted disease (STD) testing.  Diabetes screening. This is done by checking your blood sugar (glucose) after you have not eaten for a while (fasting). You may have this done every 1-3 years.  Abdominal aortic aneurysm (AAA) screening. You may need this if you are a current or former smoker.  Osteoporosis. You may be screened starting at age 50 if you are at high risk. Talk with your health care provider about your test results, treatment options, and if necessary, the need for more tests. Vaccines  Your health care provider may recommend certain vaccines, such as:  Influenza vaccine. This is recommended every year.  Tetanus, diphtheria, and acellular pertussis (Tdap, Td) vaccine. You may need a Td booster every  10 years.  Zoster vaccine. You may need this after age 61.  Pneumococcal 13-valent conjugate (PCV13) vaccine. One dose is recommended after age 66.  Pneumococcal polysaccharide (PPSV23) vaccine. One dose is recommended after age 88.  Talk to your health care provider about which screenings and vaccines you need and how often you need them. This information is not intended to replace advice given to you by your health care provider. Make sure you discuss any questions you have with your health care provider. Document Released: 12/15/2015 Document Revised: 08/07/2016 Document Reviewed: 09/19/2015 Elsevier Interactive Patient Education  2017 [place] Prevention in the Home Falls can cause injuries. They can happen to people of all ages. There are many things you can do to make your home safe and to help prevent falls. What can I do on the outside of my home?  Regularly fix the edges of walkways and driveways and fix any cracks.  Remove anything that might make you trip as you walk through a door, such as a raised step or threshold.  Trim any bushes or trees on the path to your home.  Use bright outdoor lighting.  Clear any walking paths of anything that might make someone trip, such as rocks or tools.  Regularly check to see if handrails are loose or broken. Make sure that both sides of any steps have handrails.  Any raised decks and porches should have guardrails on the edges.  Have any leaves, snow, or ice cleared regularly.  Use sand or salt on walking paths during winter.  Clean up any spills in your garage right away. This includes oil or grease spills. What can I do in the bathroom?  Use night lights.  Install grab bars by the toilet and in the tub and shower. Do not use towel bars as grab bars.  Use non-skid mats or decals in the tub or shower.  If you need to sit down in the shower, use a plastic, non-slip stool.  Keep the floor dry. Clean up any water that spills on the floor as soon as it happens.  Remove soap buildup in the tub or shower regularly.  Attach bath mats securely with double-sided non-slip rug tape.  Do not have throw rugs and other things on the floor that can make you  trip. What can I do in the bedroom?  Use night lights.  Make sure that you have a light by your bed that is easy to reach.  Do not use any sheets or blankets that are too big for your bed. They should not hang down onto the floor.  Have a firm chair that has side arms. You can use this for support while you get dressed.  Do not have throw rugs and other things on the floor that can make you trip. What can I do in the kitchen?  Clean up any spills right away.  Avoid walking on wet floors.  Keep items that you use a lot in easy-to-reach places.  If you need to reach something above you, use a strong step stool that has a grab bar.  Keep electrical cords out of the way.  Do not use floor polish or wax that makes floors slippery. If you must use wax, use non-skid floor wax.  Do not have throw rugs and other things on the floor that can make you trip. What can I do with my stairs?  Do not leave any items on the stairs.  Make sure  that there are handrails on both sides of the stairs and use them. Fix handrails that are broken or loose. Make sure that handrails are as long as the stairways.  Check any carpeting to make sure that it is firmly attached to the stairs. Fix any carpet that is loose or worn.  Avoid having throw rugs at the top or bottom of the stairs. If you do have throw rugs, attach them to the floor with carpet tape.  Make sure that you have a light switch at the top of the stairs and the bottom of the stairs. If you do not have them, ask someone to add them for you. What else can I do to help prevent falls?  Wear shoes that:  Do not have high heels.  Have rubber bottoms.  Are comfortable and fit you well.  Are closed at the toe. Do not wear sandals.  If you use a stepladder:  Make sure that it is fully opened. Do not climb a closed stepladder.  Make sure that both sides of the stepladder are locked into place.  Ask someone to hold it for you, if  possible.  Clearly mark and make sure that you can see:  Any grab bars or handrails.  First and last steps.  Where the edge of each step is.  Use tools that help you move around (mobility aids) if they are needed. These include:  Canes.  Walkers.  Scooters.  Crutches.  Turn on the lights when you go into a dark area. Replace any light bulbs as soon as they burn out.  Set up your furniture so you have a clear path. Avoid moving your furniture around.  If any of your floors are uneven, fix them.  If there are any pets around you, be aware of where they are.  Review your medicines with your doctor. Some medicines can make you feel dizzy. This can increase your chance of falling. Ask your doctor what other things that you can do to help prevent falls. This information is not intended to replace advice given to you by your health care provider. Make sure you discuss any questions you have with your health care provider. Document Released: 09/14/2009 Document Revised: 04/25/2016 Document Reviewed: 12/23/2014 Elsevier Interactive Patient Education  2017 [employer].

## 2019-10-07 NOTE — Progress Notes (Signed)
Subjective:   Shawn Kitchens Sr. is a 73 y.o. male who presents for Medicare Annual/Subsequent preventive examination.  This visit is being conducted via phone call  - after an attmept to do on video chat - due to the COVID-19 pandemic. This patient has given me verbal consent via phone to conduct this visit, patient states they are participating from their home address. Some vital signs may be absent or patient reported.   Patient identification: identified by name, DOB, and current address.    Review of Systems:  Cardiac Risk Factors include: advanced age (>50men, >33 women);male gender;dyslipidemia;hypertension     Objective:    Vitals: BP 132/79 Comment: pt reported  Pulse 66 Comment: pt reported  Ht 6\' 1"  (1.854 m) Comment: pt reported  Wt 202 lb (91.6 kg) Comment: pt reported  BMI 26.65 kg/m   Body mass index is 26.65 kg/m.  Advanced Directives 10/07/2019 04/13/2019 01/12/2019 12/22/2018 10/01/2018 12/01/2017 11/21/2017  Does Patient Have a Medical Advance Directive? Yes Yes Yes Yes Yes Yes Yes  Type of Advance Directive Living will;Healthcare Power of [place];Living will - Living will;Healthcare Power of [place];Living will - [place];Living will  Does patient want to make changes to medical advance directive? - - - - - - No - Patient declined  Copy of [place] in Chart? Yes - validated most recent copy scanned in chart (See row information) - - Yes - validated most recent copy scanned in chart (See row information) Yes Yes No - copy requested    Tobacco Social History   Tobacco Use  Smoking Status Former Smoker  . Packs/day: 1.00  . Years: 5.00  . Pack years: 5.00  . Types: Cigarettes  . Quit date: 06/21/1971  . Years since quitting: 48.3  Smokeless Tobacco Former Systems developer  . Types: Chew  . Quit date: 06/20/1980     Counseling given: Not Answered   Clinical Intake:   Pre-visit preparation completed: Yes  Pain : No/denies pain     Nutritional Status: BMI 25 -29 Overweight Nutritional Risks: None Diabetes: No  How often do you need to have someone help you when you read instructions, pamphlets, or other written materials from your doctor or pharmacy?: 1 - Never  Interpreter Needed?: No  Information entered by ::  ,LPN  Past Medical History:  Diagnosis Date  . Arthritis   . Basal cell carcinoma 1999   basal cell/skin  . CAD (coronary artery disease)   . Cataract Cataract surgery 2017  . GERD (gastroesophageal reflux disease)   . History of kidney stones 2013   passed stone on his own  . Hyperlipidemia   . Hypertension   . Shingles   . Ventricular tachycardia [facility])    Past Surgical History:  Procedure Laterality Date  . CARDIAC CATHETERIZATION N/A 01/16/2016   Procedure: Left Heart Cath and Coronary Angiography;  Surgeon: [name], MD;  Location: [place] [facility];  Service: Cardiovascular;  Laterality: N/A;  . CATARACT EXTRACTION Left 2017  . CORONARY ANGIOPLASTY  2000 and 2003  . EYE SURGERY  2017   Cateract  . GANGLION CYST EXCISION  1979  . KNEE ARTHROSCOPY WITH MEDIAL MENISECTOMY Left 12/01/2017   Procedure: KNEE ARTHROSCOPY WITH MEDIAL MENISECTOMY;  Surgeon: [name], MD;  Location: [facility] OR[facility];  Service: Orthopedics;  Laterality: Left;  Partial  . LEFT HEART CATH Right 08/07/2017   Procedure: Left Heart Cath;  Surgeon: [name], MD;  Location: [place] [facility];  Service: Cardiovascular;  Laterality: Right;  . PILONIDAL CYST EXCISION  1977  . SKIN CANCER EXCISION  1999   ear  . SYNOVECTOMY Left 12/01/2017   Procedure: SYNOVECTOMY;  Surgeon: [name], MD;  Location: [facility] OR[facility];  Service: Orthopedics;  Laterality: Left;  Partial  . V TACH ABLATION N/A 08/14/2017   Procedure: V Tach Ablation;  Surgeon: [name], MD;  Location: [place] [facility];  Service: Cardiovascular;   Laterality: N/A;  . VEIN SURGERY  2000   laser   Family History  Problem Relation Age of Onset  . Hypertension Mother   . Heart disease Mother   . Heart attack Mother   . Diabetes Mother   . Alzheimer's disease Father   . Congestive Heart Failure Brother   . Hypertension Brother   . Other Brother        dementia  . Heart disease Brother   . Cancer Maternal Grandfather   . Brain cancer Daughter 52  . Cancer Daughter   . Cancer Maternal Uncle   . Heart disease Son    Social History   Socioeconomic History  . Marital status: Married    Spouse name: Not on file  . Number of children: 2  . Years of education: Not on file  . Highest education level: Master's degree (e.g., MA, MS, MEng, MEd, MSW, MBA)  Occupational History  . Occupation: retired  Scientific laboratory technician  . Financial resource strain: Not hard at all  . Food insecurity    Worry: Never true    Inability: Never true  . Transportation needs    Medical: No    Non-medical: No  Tobacco Use  . Smoking status: Former Smoker    Packs/day: 1.00    Years: 5.00    Pack years: 5.00    Types: Cigarettes    Quit date: 06/21/1971    Years since quitting: 48.3  . Smokeless tobacco: Former Systems developer    Types: [place] date: 06/20/1980  Substance and Sexual Activity  . Alcohol use: Not Currently    Alcohol/week: 2.0 standard drinks    Types: 2 Glasses of wine per week    Comment: 1-2 a week  . Drug use: No  . Sexual activity: Not Currently  Lifestyle  . Physical activity    Days per week: 5 days    Minutes per session: 30 min  . Stress: Patient refused  Relationships  . Social connections    Talks on phone: More than three times a week    Gets together: More than three times a week    Attends religious service: More than 4 times per year    Active member of club or organization: Yes    Attends meetings of clubs or organizations: More than 4 times per year    Relationship status: Married  Other Topics Concern  . Not on  file  Social History Narrative   Lives in [place]   Retired school principal    Outpatient Encounter Medications as of 10/07/2019  Medication Sig  . acetaminophen (TYLENOL) 500 MG tablet Take 1,000 mg by mouth every 6 (six) hours as needed for moderate pain or headache.  Marland Kitchen aspirin EC 81 MG tablet Take 81 mg by mouth daily.  Marland Kitchen atorvastatin (LIPITOR) 80 MG tablet Take 1 tablet (80 mg total) by mouth at bedtime.  . fexofenadine (ALLEGRA) 180 MG tablet Take 1  tablet (180 mg total) by mouth daily.  . flecainide (TAMBOCOR) 100 MG tablet Take 2 tablets (200 mg total) as needed by mouth. As needed for recurrent tachycardia.  Do not repeat more than once per day or 3x per wk  . metoprolol succinate (TOPROL-XL) 25 MG 24 hr tablet Take 1 tablet (25 mg total) by mouth daily.  . naproxen sodium (ALEVE) 220 MG tablet Take 220 mg by mouth 2 (two) times daily as needed (for pain or headache).  . niacin (NIASPAN) 1000 MG CR tablet Take 1 tablet (1,000 mg total) by mouth at bedtime.  . nitroGLYCERIN (NITROSTAT) 0.4 MG SL tablet nitroglycerin 0.4 mg subl  . pantoprazole (PROTONIX) 40 MG tablet Take 1 tablet (40 mg total) by mouth daily.  . ramipril (ALTACE) 5 MG capsule Take 1 capsule (5 mg total) by mouth daily.  . vitamin B-12 (CYANOCOBALAMIN) 1000 MCG tablet Take 1 tablet (1,000 mcg total) by mouth daily.   No facility-administered encounter medications on file as of 10/07/2019.     Activities of Daily Living In your present state of health, do you have any difficulty performing the following activities: 10/07/2019 10/07/2019  Hearing? Y -  Comment difficulty mostly in right ear -  Vision? Y -  Comment eyeglasses, goes to [facility].has some problems with left eye in last yr -  Difficulty concentrating or making decisions? N -  Walking or climbing stairs? Y (No Data)  Comment knee pain knee pain  Dressing or bathing? N -  Doing errands, shopping? N -  Preparing Food and eating ? N -  Using the  Toilet? N -  In the past six months, have you accidently leaked urine? N -  Do you have problems with loss of bowel control? N -  Managing your Medications? N -  Managing your Finances? N -  Housekeeping or managing your Housekeeping? N -  Some recent data might be hidden    Patient Care Team: [name], MD as PCP - General (Family Medicine) [name], MD as Consulting Physician (Cardiology) [name], MD as Consulting Physician (Oncology)   Assessment:   This is a routine wellness examination for Shawn Shepard.  Exercise Activities and Dietary recommendations Current Exercise Habits: Home exercise routine, Type of exercise: walking, Time (Minutes): 45(2-3 miles), Frequency (Times/Week): 7, Weekly Exercise (Minutes/Week): 315, Intensity: Mild, Exercise limited by: None identified  Goals    . DIET - INCREASE WATER INTAKE     Recommend drinking 6-8 glasses of water per day       Fall Risk: Fall Risk  10/07/2019 11/09/2018 10/01/2018 05/04/2018 10/17/2017  Falls in the past year? 0 0 No No No  Number falls in past yr: 0 - - - -  Injury with Fall? 0 - - - -  Follow up - Falls evaluation completed - - -    FALL RISK PREVENTION PERTAINING TO THE HOME:  Any stairs in or around the home? No  If so, are there any without handrails? No   Home free of loose throw rugs in walkways, pet beds, electrical cords, etc? Yes  Adequate lighting in your home to reduce risk of falls? Yes   ASSISTIVE DEVICES UTILIZED TO PREVENT FALLS:  Life alert? No  Use of a cane, walker or w/c? No  Grab bars in the bathroom? No  Shower chair or bench in shower? No  Elevated toilet seat or a handicapped toilet? No   TIMED UP AND GO:  Unable to perform  Depression Screen PHQ 2/9 Scores 10/07/2019 10/01/2018 05/04/2018 10/17/2017  PHQ - 2 Score 0 0 0 0  PHQ- 9 Score - 2 - 0    Cognitive Function     6CIT Screen 10/01/2018 09/11/2017  What Year? 0 points 0 points  What month? 0 points 0 points   What time? 0 points 0 points  Count back from 20 0 points 0 points  Months in reverse 0 points 0 points  Repeat phrase 0 points 0 points  Total Score 0 0    Immunization History  Administered Date(s) Administered  . Influenza, High Dose Seasonal PF 08/26/2017, 08/05/2019  . Influenza-Unspecified 09/27/2015, 08/21/2016, 08/26/2017, 09/04/2018  . Pneumococcal Conjugate-13 12/08/2014  . Pneumococcal Polysaccharide-23 09/10/2016  . Td 12/21/2007  . Zoster 12/22/2008    Qualifies for Shingles Vaccine? Yes  Zostavax completed 2010. Due for Shingrix. Education has been provided regarding the importance of this vaccine. Pt has been advised to call insurance company to determine out of pocket expense. Advised may also receive vaccine at local pharmacy or Health Dept. Verbalized acceptance and understanding.  Tdap: Discussed need for TD/TDAP vaccine, patient verbalized understanding that this is not covered as a preventative with there insurance and to call the office if he develops any new skin injuries, ie: cuts, scrapes, bug bites, or open wounds.  Flu Vaccine: up to date   Pneumococcal Vaccine: up to date   Screening Tests Health Maintenance  Topic Date Due  . TETANUS/TDAP  12/20/2017  . COLONOSCOPY  02/15/2019  . COLON CANCER SCREENING ANNUAL FOBT  06/01/2020  . INFLUENZA VACCINE  Completed  . Hepatitis C Screening  Completed  . PNA vac Low Risk Adult  Completed   Cancer Screenings:  Colorectal Screening: Completed hemoccult 06/01/2009  Lung Cancer Screening: (Low Dose CT Chest recommended if Age 39-80 years, 30 pack-year currently smoking OR have quit w/in 15years.) does not qualify.    Additional Screening:  Hepatitis C Screening: does qualify; Completed 12/2018  Vision Screening: Recommended annual ophthalmology exams for early detection of glaucoma and other disorders of the eye. Is the patient up to date with their annual eye exam?  Yes  Who is the provider or what is  the name of the office in which the pt attends annual eye exams? Dr.Nice  Dental Screening: Recommended annual dental exams for proper oral hygiene  Community Resource Referral:  CRR required this visit?  No        Plan:  I have personally reviewed and addressed the Medicare Annual Wellness questionnaire and have noted the following in the patient's chart:  A. Medical and social history B. Use of alcohol, tobacco or illicit drugs  C. Current medications and supplements D. Functional ability and status E.  Nutritional status F.  Physical activity G. Advance directives H. List of other physicians I.  Hospitalizations, surgeries, and ER visits in previous 12 months J.  [place] such as hearing and vision if needed, cognitive and depression L. Referrals and appointments   In addition, I have reviewed and discussed with patient certain preventive protocols, quality metrics, and best practice recommendations. A written personalized care plan for preventive services as well as general preventive health recommendations were provided to patient.   Signed,   Bevelyn Ngo, LPN  [id number] Nurse Health Advisor   Nurse Notes: none

## 2019-10-12 ENCOUNTER — Encounter: Payer: Self-pay | Admitting: Oncology

## 2019-10-12 ENCOUNTER — Ambulatory Visit: Payer: Medicare Other

## 2019-10-12 ENCOUNTER — Other Ambulatory Visit: Payer: Self-pay

## 2019-10-12 ENCOUNTER — Inpatient Hospital Stay (HOSPITAL_BASED_OUTPATIENT_CLINIC_OR_DEPARTMENT_OTHER): Payer: Medicare Other | Admitting: Oncology

## 2019-10-12 ENCOUNTER — Inpatient Hospital Stay: Payer: Medicare Other | Attending: Oncology

## 2019-10-12 VITALS — BP 142/78 | HR 55 | Temp 97.1°F | Resp 16 | Wt 210.7 lb

## 2019-10-12 DIAGNOSIS — F17210 Nicotine dependence, cigarettes, uncomplicated: Secondary | ICD-10-CM | POA: Insufficient documentation

## 2019-10-12 DIAGNOSIS — D72819 Decreased white blood cell count, unspecified: Secondary | ICD-10-CM | POA: Insufficient documentation

## 2019-10-12 DIAGNOSIS — D709 Neutropenia, unspecified: Secondary | ICD-10-CM

## 2019-10-12 DIAGNOSIS — D696 Thrombocytopenia, unspecified: Secondary | ICD-10-CM

## 2019-10-12 DIAGNOSIS — Z79899 Other long term (current) drug therapy: Secondary | ICD-10-CM | POA: Diagnosis not present

## 2019-10-12 LAB — CBC WITH DIFFERENTIAL/PLATELET
Abs Immature Granulocytes: 0.01 K/uL (ref 0.00–0.07)
Basophils Absolute: 0 K/uL (ref 0.0–0.1)
Basophils Relative: 1 %
Eosinophils Absolute: 0.2 K/uL (ref 0.0–0.5)
Eosinophils Relative: 6 %
HCT: 40.1 % (ref 39.0–52.0)
Hemoglobin: 13.4 g/dL (ref 13.0–17.0)
Immature Granulocytes: 0 %
Lymphocytes Relative: 27 %
Lymphs Abs: 0.8 K/uL (ref 0.7–4.0)
MCH: 31.2 pg (ref 26.0–34.0)
MCHC: 33.4 g/dL (ref 30.0–36.0)
MCV: 93.5 fL (ref 80.0–100.0)
Monocytes Absolute: 0.5 K/uL (ref 0.1–1.0)
Monocytes Relative: 17 %
Neutro Abs: 1.4 K/uL — ABNORMAL LOW (ref 1.7–7.7)
Neutrophils Relative %: 49 %
Platelets: 130 K/uL — ABNORMAL LOW (ref 150–400)
RBC: 4.29 MIL/uL (ref 4.22–5.81)
RDW: 13.3 % (ref 11.5–15.5)
WBC: 2.8 K/uL — ABNORMAL LOW (ref 4.0–10.5)
nRBC: 0 % (ref 0.0–0.2)

## 2019-10-12 LAB — COMPREHENSIVE METABOLIC PANEL
ALT: 20 U/L (ref 0–44)
AST: 22 U/L (ref 15–41)
Albumin: 3.8 g/dL (ref 3.5–5.0)
Alkaline Phosphatase: 69 U/L (ref 38–126)
Anion gap: 3 — ABNORMAL LOW (ref 5–15)
BUN: 12 mg/dL (ref 8–23)
CO2: 25 mmol/L (ref 22–32)
Calcium: 9.6 mg/dL (ref 8.9–10.3)
Chloride: 109 mmol/L (ref 98–111)
Creatinine, Ser: 0.81 mg/dL (ref 0.61–1.24)
GFR calc Af Amer: >60 mL/min (ref >60)
GFR calc non Af Amer: >60 mL/min (ref >60)
Glucose, Bld: 109 mg/dL — ABNORMAL HIGH (ref 70–99)
Potassium: 4.4 mmol/L (ref 3.5–5.1)
Sodium: 137 mmol/L (ref 135–145)
Total Bilirubin: 1.1 mg/dL (ref 0.3–1.2)
Total Protein: 6.7 g/dL (ref 6.5–8.1)

## 2019-10-12 LAB — C-REACTIVE PROTEIN: CRP: <0.8 mg/dL (ref <1.0)

## 2019-10-12 LAB — SEDIMENTATION RATE: Sed Rate: 8 mm/hr (ref 0–20)

## 2019-10-12 NOTE — Progress Notes (Signed)
Hematology/Oncology follow-up note [facility] [phone] [phone]   Patient Care Team: [name], MD as PCP - General (Family Medicine) [name], MD as Consulting Physician (Cardiology) [name], MD as Consulting Physician (Oncology)  REFERRING PROVIDER: [name], MD  CHIEF COMPLAINTS/REASON FOR VISIT:  Follow-up for management of leukopenia and thrombocytopenia  HISTORY OF PRESENTING ILLNESS:  Shawn Kitchens Sr. is a  73 y.o.  male with PMH listed below who was referred to me for evaluation of leukopenia. Reviewed patient's recent labs. Patient has low total WBC count was 2.6, predominantly absolute lymphocyte 0.6. Earlier lab lab done 11/2019 showed WBC 2.8, lymphocyte 0.7, neutrophil 1.4.  Previous lab records reviewed. 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 No aggravating or improving factors.  Associated symptoms:  denies fatigue, weight loss, fever, chills, frequent infection.  History hepatitis or HIV infection: Denies History of chronic liver disease: Denies History of blood transfusion: Denies Alcohol consumption: Denies Diet Vegetarian or Vegan: Denies Herbal medication: Denies  Reports feeling quite tired and fatigued recently.  He and his wife recently downsized to a one-story home and he has been moving a lot of furniture's by himself.  He feels it took a week for his knee to recover after the most. Denies any unintentional weight loss, fever or chills, night sweating. He is very active, walks every day. Previous occupation was a Product/process development scientist.  Drinks a cocktail every day.  Usually drinks cocktail with quinine tonic water for lower extremity cramps.  He started to do this after his ventricular tachycardic episodes about 1-1/2 years ago. Former smoker, quitting in 1972.  INTERVAL HISTORY Shawn POSTLEWAIT Sr. is a 73 y.o. male who has above history  reviewed by me today presents for follow up visit for management of leukopenia and thrombocytopenia. Problems and complaints are listed below: Patient reports feeling well. He is no longer drinking alcohol.  Also not using quinine contained tonic water Denies any recent infections, fever, chills, new medication Energy level has been at baseline.  He exercises every day No unintentional weight loss, night sweating. .  Review of Systems  Constitutional: Negative for chills, fever, malaise/fatigue and weight loss.  HENT: Negative for sore throat.   Eyes: Negative for redness.  Respiratory: Negative for cough, shortness of breath and wheezing.   Cardiovascular: Negative for chest pain, palpitations and leg swelling.  Gastrointestinal: Negative for abdominal pain, blood in stool, nausea and vomiting.  Genitourinary: Negative for dysuria.  Musculoskeletal: Negative for myalgias.  Skin: Negative for rash.  Neurological: Negative for dizziness, tingling and tremors.  Endo/Heme/Allergies: Does not bruise/bleed easily.  Psychiatric/Behavioral: Negative for hallucinations.    MEDICAL HISTORY:  Past Medical History:  Diagnosis Date  . Arthritis   . Basal cell carcinoma 1999   basal cell/skin  . CAD (coronary artery disease)   . Cataract Cataract surgery 2017  . GERD (gastroesophageal reflux disease)   . History of kidney stones 2013   passed stone on his own  . Hyperlipidemia   . Hypertension   . Shingles   . Ventricular tachycardia ([place])     SURGICAL HISTORY: Past Surgical History:  Procedure Laterality Date  . CARDIAC CATHETERIZATION N/A 01/16/2016   Procedure: Left Heart Cath and Coronary Angiography;  Surgeon: [name], MD;  Location: [place] [facility];  Service: Cardiovascular;  Laterality: N/A;  . CATARACT EXTRACTION Left 2017  . CORONARY ANGIOPLASTY  2000 and 2003  .  EYE SURGERY  2017   Cateract  . GANGLION CYST EXCISION  1979  . KNEE ARTHROSCOPY WITH MEDIAL  MENISECTOMY Left 12/01/2017   Procedure: KNEE ARTHROSCOPY WITH MEDIAL MENISECTOMY;  Surgeon: [name], MD;  Location: [facility];  Service: Orthopedics;  Laterality: Left;  Partial  . LEFT HEART CATH Right 08/07/2017   Procedure: Left Heart Cath;  Surgeon: [name], MD;  Location: [place] [facility];  Service: Cardiovascular;  Laterality: Right;  . PILONIDAL CYST EXCISION  1977  . SKIN CANCER EXCISION  1999   ear  . SYNOVECTOMY Left 12/01/2017   Procedure: SYNOVECTOMY;  Surgeon: [name], MD;  Location: [facility];  Service: Orthopedics;  Laterality: Left;  Partial  . V TACH ABLATION N/A 08/14/2017   Procedure: V Tach Ablation;  Surgeon: [name], MD;  Location: [place] [facility];  Service: Cardiovascular;  Laterality: N/A;  . VEIN SURGERY  2000   laser    SOCIAL HISTORY: Social History   Socioeconomic History  . Marital status: Married    Spouse name: Not on file  . Number of children: 2  . Years of education: Not on file  . Highest education level: Master's degree (e.g., MA, MS, MEng, MEd, MSW, MBA)  Occupational History  . Occupation: retired  Scientific laboratory technician  . Financial resource strain: Not hard at all  . Food insecurity    Worry: Never true    Inability: Never true  . Transportation needs    Medical: No    Non-medical: No  Tobacco Use  . Smoking status: Former Smoker    Packs/day: 1.00    Years: 5.00    Pack years: 5.00    Types: Cigarettes    Quit date: 06/21/1971    Years since quitting: 48.3  . Smokeless tobacco: Former Systems developer    Types: [place] date: 06/20/1980  Substance and Sexual Activity  . Alcohol use: Not Currently    Alcohol/week: 2.0 standard drinks    Types: 2 Glasses of wine per week    Comment: 1-2 a week  . Drug use: No  . Sexual activity: Not Currently  Lifestyle  . Physical activity    Days per week: 5 days    Minutes per session: 30 min  . Stress: Patient refused  Relationships  . Social connections    Talks  on phone: More than three times a week    Gets together: More than three times a week    Attends religious service: More than 4 times per year    Active member of club or organization: Yes    Attends meetings of clubs or organizations: More than 4 times per year    Relationship status: Married  . Intimate partner violence    Fear of current or ex partner: No    Emotionally abused: No    Physically abused: No    Forced sexual activity: No  Other Topics Concern  . Not on file  Social History Narrative   Lives in [place]   Retired school principal    FAMILY HISTORY: Family History  Problem Relation Age of Onset  . Hypertension Mother   . Heart disease Mother   . Heart attack Mother   . Diabetes Mother   . Alzheimer's disease Father   . Congestive Heart Failure Brother   . Hypertension Brother   . Other Brother        dementia  . Heart disease Brother   .  Cancer Maternal Grandfather   . Brain cancer Daughter 53  . Cancer Daughter   . Cancer Maternal Uncle   . Heart disease Son     ALLERGIES:  has No Known Allergies.  MEDICATIONS:  Current Outpatient Medications  Medication Sig Dispense Refill  . acetaminophen (TYLENOL) 500 MG tablet Take 1,000 mg by mouth every 6 (six) hours as needed for moderate pain or headache.    Marland Kitchen aspirin EC 81 MG tablet Take 81 mg by mouth daily.    Marland Kitchen atorvastatin (LIPITOR) 80 MG tablet Take 1 tablet (80 mg total) by mouth at bedtime. 90 tablet 4  . fexofenadine (ALLEGRA) 180 MG tablet Take 1 tablet (180 mg total) by mouth daily. 90 tablet 4  . flecainide (TAMBOCOR) 100 MG tablet Take 2 tablets (200 mg total) as needed by mouth. As needed for recurrent tachycardia.  Do not repeat more than once per day or 3x per wk 2 tablet 3  . metoprolol succinate (TOPROL-XL) 25 MG 24 hr tablet Take 1 tablet (25 mg total) by mouth daily. 90 tablet 2  . naproxen sodium (ALEVE) 220 MG tablet Take 220 mg by mouth 2 (two) times daily as needed (for pain or  headache).    . niacin (NIASPAN) 1000 MG CR tablet Take 1 tablet (1,000 mg total) by mouth at bedtime. 90 tablet 4  . nitroGLYCERIN (NITROSTAT) 0.4 MG SL tablet nitroglycerin 0.4 mg subl    . pantoprazole (PROTONIX) 40 MG tablet Take 1 tablet (40 mg total) by mouth daily. 90 tablet 4  . ramipril (ALTACE) 5 MG capsule Take 1 capsule (5 mg total) by mouth daily. 30 capsule 0  . vitamin B-12 (CYANOCOBALAMIN) 1000 MCG tablet Take 1 tablet (1,000 mcg total) by mouth daily. 90 tablet 0   No current facility-administered medications for this visit.      PHYSICAL EXAMINATION: ECOG PERFORMANCE STATUS: 0 - Asymptomatic Vitals:   10/12/19 1026  BP: (!) 142/78  Pulse: (!) 55  Resp: 16  Temp: (!) 97.1 F (36.2 C)  SpO2: 98%   Filed Weights   10/12/19 1026  Weight: 210 lb 11.2 oz (95.6 kg)    Physical Exam Constitutional:      General: He is not in acute distress. HENT:     Head: Normocephalic and atraumatic.  Eyes:     General: No scleral icterus.    Pupils: Pupils are equal, round, and reactive to light.  Neck:     Musculoskeletal: Normal range of motion and neck supple.  Cardiovascular:     Rate and Rhythm: Normal rate and regular rhythm.     Heart sounds: Normal heart sounds.  Pulmonary:     Effort: Pulmonary effort is normal. No respiratory distress.     Breath sounds: No wheezing.  Abdominal:     General: Bowel sounds are normal. There is no distension.     Palpations: Abdomen is soft. There is no mass.     Tenderness: There is no abdominal tenderness.  Musculoskeletal: Normal range of motion.        General: No deformity.  Skin:    General: Skin is warm and dry.     Findings: No erythema or rash.  Neurological:     Mental Status: He is alert and oriented to person, place, and time.     Cranial Nerves: No cranial nerve deficit.     Coordination: Coordination normal.  Psychiatric:        Behavior: Behavior normal.  Thought Content: Thought content normal.       LABORATORY DATA:  I have reviewed the data as listed Lab Results  Component Value Date   WBC 2.8 (L) 10/12/2019   HGB 13.4 10/12/2019   HCT 40.1 10/12/2019   MCV 93.5 10/12/2019   PLT 130 (L) 10/12/2019   Recent Labs    04/12/19 1146 05/31/19 0852 05/31/19 0856 06/02/19 0852 07/12/19 0912 10/12/19 1014  NA 138  --  137  --  140 137  K 4.4  --  4.8  --  4.6 4.4  CL 108  --  102  --  108 109  CO2 24  --  24  --  27 25  GLUCOSE 99  --  104* CANCELED 107* 109*  BUN 15  --  15  --  14 12  CREATININE 0.89  --  0.87  --  0.89 0.81  CALCIUM 10.0  --  10.4*  --  10.0 9.6  GFRNONAA >60  --  87  --  >60 >60  GFRAA >60  --  100  --  >60 >60  PROT 6.4*  --   --   --  6.5 6.7  ALBUMIN 3.7  --   --   --  3.7 3.8  AST 22 32  --   --  22 22  ALT 19 24  --   --  20 20  ALKPHOS 63  --   --   --  66 69  BILITOT 0.9  --   --   --  1.2 1.1   Iron/TIBC/Ferritin/ %Sat No results found for: IRON, TIBC, FERRITIN, IRONPCTSAT     ASSESSMENT & PLAN:  1. Leukopenia, unspecified type   2. Thrombocytopenia (HCC)    Chronic leukopenia, predominantly neutropenia Thrombocytopenia, status post extensive work-up in the past.  Including normal smear, normal folate level, normal B12 level, hepatitis negative, negative HIV, negative flow cytometry.  Multiple myeloma panel was not remarkable except slightly increased IgM level.  Ultrasound abdomen showed normal spleen size. Patient has stopped alcohol use and not using any quinine-containing tonic water.  Today lab results were reviewed and discussed with patient. Patient has worsening of total white blood cell count to 2.8, platelet counts 1 30,000 neutrophils 1.4. I would obtain ANA, CRP, ESR.  If all negative I had a discussion with him about possible underlying MDS and recommend proceeding with bone marrow biopsy to confirm. Patient agrees with the plan. Follow-up in the clinic 1 week after bone marrow biopsy.  Orders Placed This Encounter   Procedures  . CT BONE MARROW BIOPSY & ASPIRATION    Standing Status:   Future    Standing Expiration Date:   01/11/2021    Order Specific Question:   Reason for Exam (SYMPTOM  OR DIAGNOSIS REQUIRED)    Answer:   neutropenia, thrombocytopenia    Order Specific Question:   Preferred location?    Answer:   [facility]    Order Specific Question:   Radiology Contrast Protocol - do NOT remove file path    Answer:   [email or web address]  . Sedimentation rate    Standing Status:   Future    Number of Occurrences:   1    Standing Expiration Date:   10/11/2020  . C-reactive protein    Standing Status:   Future    Number of Occurrences:   1    Standing Expiration Date:   10/11/2020  . ANA, IFA (with reflex)  Standing Status:   Future    Number of Occurrences:   1    Standing Expiration Date:   10/11/2020    We spent sufficient time to discuss many aspect of care, questions were answered to patient's satisfaction. Total face to face encounter time for this patient visit was 25 min. >50% of the time was  spent in counseling and coordination of care.    [name], MD, PhD  10/12/2019

## 2019-10-12 NOTE — Progress Notes (Signed)
Patient does not offer any problems today.  

## 2019-10-13 ENCOUNTER — Other Ambulatory Visit: Payer: Self-pay | Admitting: Oncology

## 2019-10-13 DIAGNOSIS — D72819 Decreased white blood cell count, unspecified: Secondary | ICD-10-CM

## 2019-10-13 DIAGNOSIS — D696 Thrombocytopenia, unspecified: Secondary | ICD-10-CM

## 2019-10-14 ENCOUNTER — Telehealth: Payer: Self-pay

## 2019-10-14 NOTE — Telephone Encounter (Signed)
Received call from [facility] @ central scheduling with patient's appt info. Pt scheduled for bone marrow biopsy on 11/24 @ 8:30am.

## 2019-10-17 LAB — ANTINUCLEAR ANTIBODIES, IFA: ANA Ab, IFA: NEGATIVE

## 2019-10-21 NOTE — Progress Notes (Signed)
Patient for BMB 10/26/2019, called and spoke with patient with pre procedure instructions given along with time to be here @ 0730/and NPO after MN prior to procedure. Stated understanding of when to be here and someone to drive him home.

## 2019-10-25 ENCOUNTER — Other Ambulatory Visit: Payer: Self-pay | Admitting: Radiology

## 2019-10-26 ENCOUNTER — Other Ambulatory Visit: Payer: Self-pay

## 2019-10-26 ENCOUNTER — Ambulatory Visit
Admission: RE | Admit: 2019-10-26 | Discharge: 2019-10-26 | Disposition: A | Discharge status: Home / Self Care | Payer: Medicare Other | Source: Ambulatory Visit | Attending: Oncology | Admitting: Oncology

## 2019-10-26 DIAGNOSIS — I10 Essential (primary) hypertension: Secondary | ICD-10-CM | POA: Insufficient documentation

## 2019-10-26 DIAGNOSIS — Z809 Family history of malignant neoplasm, unspecified: Secondary | ICD-10-CM | POA: Insufficient documentation

## 2019-10-26 DIAGNOSIS — Z808 Family history of malignant neoplasm of other organs or systems: Secondary | ICD-10-CM | POA: Diagnosis not present

## 2019-10-26 DIAGNOSIS — K219 Gastro-esophageal reflux disease without esophagitis: Secondary | ICD-10-CM | POA: Insufficient documentation

## 2019-10-26 DIAGNOSIS — Z7982 Long term (current) use of aspirin: Secondary | ICD-10-CM | POA: Insufficient documentation

## 2019-10-26 DIAGNOSIS — Z79899 Other long term (current) drug therapy: Secondary | ICD-10-CM | POA: Diagnosis not present

## 2019-10-26 DIAGNOSIS — E785 Hyperlipidemia, unspecified: Secondary | ICD-10-CM | POA: Insufficient documentation

## 2019-10-26 DIAGNOSIS — D696 Thrombocytopenia, unspecified: Secondary | ICD-10-CM | POA: Insufficient documentation

## 2019-10-26 DIAGNOSIS — Z8249 Family history of ischemic heart disease and other diseases of the circulatory system: Secondary | ICD-10-CM | POA: Diagnosis not present

## 2019-10-26 DIAGNOSIS — I2510 Atherosclerotic heart disease of native coronary artery without angina pectoris: Secondary | ICD-10-CM | POA: Insufficient documentation

## 2019-10-26 DIAGNOSIS — I472 Ventricular tachycardia: Secondary | ICD-10-CM | POA: Insufficient documentation

## 2019-10-26 DIAGNOSIS — M1990 Unspecified osteoarthritis, unspecified site: Secondary | ICD-10-CM | POA: Insufficient documentation

## 2019-10-26 DIAGNOSIS — Z85828 Personal history of other malignant neoplasm of skin: Secondary | ICD-10-CM | POA: Diagnosis not present

## 2019-10-26 DIAGNOSIS — Z833 Family history of diabetes mellitus: Secondary | ICD-10-CM | POA: Diagnosis not present

## 2019-10-26 DIAGNOSIS — Z87891 Personal history of nicotine dependence: Secondary | ICD-10-CM | POA: Insufficient documentation

## 2019-10-26 DIAGNOSIS — D72819 Decreased white blood cell count, unspecified: Secondary | ICD-10-CM | POA: Diagnosis not present

## 2019-10-26 LAB — CBC WITH DIFFERENTIAL/PLATELET
Abs Immature Granulocytes: 0.05 10*3/uL (ref 0.00–0.07)
Basophils Absolute: 0 10*3/uL (ref 0.0–0.1)
Basophils Relative: 1 %
Eosinophils Absolute: 0.3 10*3/uL (ref 0.0–0.5)
Eosinophils Relative: 7 %
HCT: 42.2 % (ref 39.0–52.0)
Hemoglobin: 14.1 g/dL (ref 13.0–17.0)
Immature Granulocytes: 1 %
Lymphocytes Relative: 27 %
Lymphs Abs: 0.9 10*3/uL (ref 0.7–4.0)
MCH: 30.5 pg (ref 26.0–34.0)
MCHC: 33.4 g/dL (ref 30.0–36.0)
MCV: 91.3 fL (ref 80.0–100.0)
Monocytes Absolute: 0.5 10*3/uL (ref 0.1–1.0)
Monocytes Relative: 15 %
Neutro Abs: 1.7 10*3/uL (ref 1.7–7.7)
Neutrophils Relative %: 49 %
Platelets: 148 10*3/uL — ABNORMAL LOW (ref 150–400)
RBC: 4.62 MIL/uL (ref 4.22–5.81)
RDW: 13.3 % (ref 11.5–15.5)
WBC: 3.5 10*3/uL — ABNORMAL LOW (ref 4.0–10.5)
nRBC: 0 % (ref 0.0–0.2)

## 2019-10-26 LAB — PROTIME-INR
INR: 1 (ref 0.8–1.2)
Prothrombin Time: 13 seconds (ref 11.4–15.2)

## 2019-10-26 MED ORDER — FENTANYL CITRATE (PF) 100 MCG/2ML IJ SOLN
INTRAMUSCULAR | Status: AC | PRN
  Administered 2019-10-26: 50 ug via INTRAVENOUS

## 2019-10-26 MED ORDER — HEPARIN SOD (PORK) LOCK FLUSH 100 UNIT/ML IV SOLN
INTRAVENOUS | Status: AC
  Filled 2019-10-26: qty 5

## 2019-10-26 MED ORDER — SODIUM CHLORIDE 0.9 % IV SOLN
INTRAVENOUS | Status: DC
  Administered 2019-10-26: 20 mL/h via INTRAVENOUS

## 2019-10-26 MED ORDER — MIDAZOLAM HCL 2 MG/2ML IJ SOLN
INTRAMUSCULAR | Status: AC
  Filled 2019-10-26: qty 4

## 2019-10-26 MED ORDER — FENTANYL CITRATE (PF) 100 MCG/2ML IJ SOLN
INTRAMUSCULAR | Status: AC
  Filled 2019-10-26: qty 2

## 2019-10-26 MED ORDER — MIDAZOLAM HCL 2 MG/2ML IJ SOLN
INTRAMUSCULAR | Status: AC | PRN
  Administered 2019-10-26: 1 mg via INTRAVENOUS

## 2019-10-26 NOTE — H&P (Signed)
Chief Complaint: Patient was seen in consultation today for image guided bone marrow biopsy.  Referring Physician(s): Yu,Zhou  Supervising Physician: Daryll Brod  Patient Status: [facility] - Out-pt  History of Present Illness: Shawn Shepard Sr. is a 73 y.o. male with a past medical history significant for arthritis, GERD, CAD, HLD, HTN, leukopenia and thrombocytopenia followed by Dr. Tasia Catchings who presents today for a bone marrow biopsy. Patient was last seen by Dr. Tasia Catchings on 10/12/19 for routine follow up of known leukopenia and thrombocytopenia of unknown etiology, the possibility of MDS was discussed during that visit and decision was made to proceed with a bone marrow biopsy for further evaluation.  [name] denies any complaints today, he states he's had a few heart caths before so is familiar with the type of sedation he will be having. He states understanding of the procedure and wishes to proceed.  Past Medical History:  Diagnosis Date  . Arthritis   . Basal cell carcinoma 1999   basal cell/skin  . CAD (coronary artery disease)   . Cataract Cataract surgery 2017  . GERD (gastroesophageal reflux disease)   . History of kidney stones 2013   passed stone on his own  . Hyperlipidemia   . Hypertension   . Shingles   . Ventricular tachycardia [facility])     Past Surgical History:  Procedure Laterality Date  . CARDIAC CATHETERIZATION N/A 01/16/2016   Procedure: Left Heart Cath and Coronary Angiography;  Surgeon: [name], MD;  Location: [place] [facility];  Service: Cardiovascular;  Laterality: N/A;  . CATARACT EXTRACTION Left 2017  . CORONARY ANGIOPLASTY  2000 and 2003  . EYE SURGERY  2017   Cateract  . GANGLION CYST EXCISION  1979  . KNEE ARTHROSCOPY WITH MEDIAL MENISECTOMY Left 12/01/2017   Procedure: KNEE ARTHROSCOPY WITH MEDIAL MENISECTOMY;  Surgeon: [name], MD;  Location: [facility];  Service: Orthopedics;  Laterality: Left;  Partial  . LEFT HEART CATH Right  08/07/2017   Procedure: Left Heart Cath;  Surgeon: [name], MD;  Location: [place] [facility];  Service: Cardiovascular;  Laterality: Right;  . PILONIDAL CYST EXCISION  1977  . SKIN CANCER EXCISION  1999   ear  . SYNOVECTOMY Left 12/01/2017   Procedure: SYNOVECTOMY;  Surgeon: [name], MD;  Location: [facility];  Service: Orthopedics;  Laterality: Left;  Partial  . V TACH ABLATION N/A 08/14/2017   Procedure: V Tach Ablation;  Surgeon: [name], MD;  Location: [place] [facility];  Service: Cardiovascular;  Laterality: N/A;  . VEIN SURGERY  2000   laser    Allergies: Patient has no known allergies.  Medications: Prior to Admission medications   Medication Sig Start Date End Date Taking? Authorizing Provider  acetaminophen (TYLENOL) 500 MG tablet Take 1,000 mg by mouth every 6 (six) hours as needed for moderate pain or headache.   Yes [provider]  aspirin EC 81 MG tablet Take 81 mg by mouth daily.   Yes [provider]  atorvastatin (LIPITOR) 80 MG tablet Take 1 tablet (80 mg total) by mouth at bedtime. 11/09/18  Yes Crissman, Jeannette How, MD  fexofenadine (ALLEGRA) 180 MG tablet Take 1 tablet (180 mg total) by mouth daily. 11/09/18  Yes Crissman, Jeannette How, MD  flecainide (TAMBOCOR) 100 MG tablet Take 2 tablets (200 mg total) as needed by mouth. As needed for recurrent tachycardia.  Do not repeat more than once per day or 3x per  wk 10/13/17  Yes [name], [name], MD  metoprolol succinate (TOPROL-XL) 25 MG 24 hr tablet Take 1 tablet (25 mg total) by mouth daily. 05/11/19  Yes Crissman, Jeannette How, MD  niacin (NIASPAN) 1000 MG CR tablet Take 1 tablet (1,000 mg total) by mouth at bedtime. 11/09/18  Yes Crissman, Jeannette How, MD  pantoprazole (PROTONIX) 40 MG tablet Take 1 tablet (40 mg total) by mouth daily. 11/09/18  Yes Crissman, Jeannette How, MD  ramipril (ALTACE) 5 MG capsule Take 1 capsule (5 mg total) by mouth daily. 08/09/17  Yes [name], MD  vitamin B-12  (CYANOCOBALAMIN) 1000 MCG tablet Take 1 tablet (1,000 mcg total) by mouth daily. 01/12/19  Yes [name], MD  naproxen sodium (ALEVE) 220 MG tablet Take 220 mg by mouth 2 (two) times daily as needed (for pain or headache).    [provider]  nitroGLYCERIN (NITROSTAT) 0.4 MG SL tablet nitroglycerin 0.4 mg subl    [provider]     Family History  Problem Relation Age of Onset  . Hypertension Mother   . Heart disease Mother   . Heart attack Mother   . Diabetes Mother   . Alzheimer's disease Father   . Congestive Heart Failure Brother   . Hypertension Brother   . Other Brother        dementia  . Heart disease Brother   . Cancer Maternal Grandfather   . Brain cancer Daughter 43  . Cancer Daughter   . Cancer Maternal Uncle   . Heart disease Son     Social History   Socioeconomic History  . Marital status: Married    Spouse name: Not on file  . Number of children: 2  . Years of education: Not on file  . Highest education level: Master's degree (e.g., MA, MS, MEng, MEd, MSW, MBA)  Occupational History  . Occupation: retired  Scientific laboratory technician  . Financial resource strain: Not hard at all  . Food insecurity    Worry: Never true    Inability: Never true  . Transportation needs    Medical: No    Non-medical: No  Tobacco Use  . Smoking status: Former Smoker    Packs/day: 1.00    Years: 5.00    Pack years: 5.00    Types: Cigarettes    Quit date: 06/21/1971    Years since quitting: 48.3  . Smokeless tobacco: Former Systems developer    Types: [place] date: 06/20/1980  Substance and Sexual Activity  . Alcohol use: Not Currently    Alcohol/week: 2.0 standard drinks    Types: 2 Glasses of wine per week    Comment: 1-2 a week  . Drug use: No  . Sexual activity: Not Currently  Lifestyle  . Physical activity    Days per week: 5 days    Minutes per session: 30 min  . Stress: Patient refused  Relationships  . Social connections    Talks on phone: More than three  times a week    Gets together: More than three times a week    Attends religious service: More than 4 times per year    Active member of club or organization: Yes    Attends meetings of clubs or organizations: More than 4 times per year    Relationship status: Married  Other Topics Concern  . Not on file  Social History Narrative   Lives in [place]   Retired school principal     Review of  Systems: A 12 point ROS discussed and pertinent positives are indicated in the HPI above.  All other systems are negative.  Review of Systems  Constitutional: Negative for appetite change, chills, fatigue and fever.  Respiratory: Negative for cough and shortness of breath.   Cardiovascular: Negative for chest pain.  Gastrointestinal: Negative for abdominal pain, blood in stool, diarrhea, nausea and vomiting.  Genitourinary: Negative for dysuria and hematuria.  Musculoskeletal: Negative for back pain.  Skin: Negative for rash and wound.  Neurological: Negative for dizziness and headaches.    Vital Signs: BP 139/82   Pulse 64   Temp 97.7 F (36.5 C) (Oral)   Resp 16   Ht '6\' 1"'  (1.854 m)   Wt 205 lb (93 kg)   SpO2 99%   BMI 27.05 kg/m   Physical Exam Vitals signs reviewed.  Constitutional:      General: He is not in acute distress. HENT:     Head: Normocephalic.     Mouth/Throat:     Mouth: Mucous membranes are moist.     Pharynx: Oropharynx is clear. No oropharyngeal exudate or posterior oropharyngeal erythema.  Cardiovascular:     Rate and Rhythm: Normal rate and regular rhythm.  Pulmonary:     Effort: Pulmonary effort is normal.     Breath sounds: Normal breath sounds.  Abdominal:     General: Bowel sounds are normal. There is no distension.     Palpations: Abdomen is soft.     Tenderness: There is no abdominal tenderness.  Skin:    General: Skin is warm and dry.  Neurological:     Mental Status: He is alert and oriented to person, place, and time.  Psychiatric:         Mood and Affect: Mood normal.        Thought Content: Thought content normal.        Judgment: Judgment normal.      MD Evaluation Airway: WNL Heart: WNL Abdomen: WNL Chest/ Lungs: WNL ASA  Classification: 2 Mallampati/Airway Score: One   Imaging: No results found.  Labs:  CBC: Recent Labs    04/12/19 1146 07/12/19 0912 07/26/19 1202 10/12/19 1014  WBC 3.4* 2.6* 3.2* 2.8*  HGB 13.3 13.2 13.2 13.4  HCT 40.2 39.5 40.6 40.1  PLT 125* 155 135* 130*    COAGS: Recent Labs    10/26/19 0749  INR 1.0    BMP: Recent Labs    04/12/19 1146 05/31/19 0856 06/02/19 0852 07/12/19 0912 10/12/19 1014  NA 138 137  --  140 137  K 4.4 4.8  --  4.6 4.4  CL 108 102  --  108 109  CO2 24 24  --  27 25  GLUCOSE 99 104* CANCELED 107* 109*  BUN 15 15  --  14 12  CALCIUM 10.0 10.4*  --  10.0 9.6  CREATININE 0.89 0.87  --  0.89 0.81  GFRNONAA >60 87  --  >60 >60  GFRAA >60 100  --  >60 >60    LIVER FUNCTION TESTS: Recent Labs    11/09/18 1005 04/12/19 1146 05/31/19 0852 07/12/19 0912 10/12/19 1014  BILITOT 0.7 0.9  --  1.2 1.1  AST 25 22 32 22 22  ALT '19 19 24 20 20  ' ALKPHOS 63 63  --  66 69  PROT 5.9* 6.4*  --  6.5 6.7  ALBUMIN 4.0 3.7  --  3.7 3.8    TUMOR MARKERS: No results for input(s): AFPTM, CEA,  CA199, CHROMGRNA in the last 8760 hours.  Assessment and Plan:  73 y/o M with history of chronic leukopenia/thrombocytopenia of unknown etiology followed by Dr. Tasia Catchings who presents today for a bone marrow biopsy to further evaluate for possible MDS.   Patient has been NPO since 9 pm last night, he did not take any medications this morning, he does not take any blood thinning/anitplatelet medications. Afebrile, INR 1.0, CBC pending at time of this note writing and will be reviewed prior to procedure.  Risks and benefits discussed with the patient including, but not limited to bleeding, infection, damage to adjacent structures or low yield requiring additional tests.   All of the patient's questions were answered, patient is agreeable to proceed.  Consent signed and in chart.  Thank you for this interesting consult.  I greatly enjoyed meeting Shawn SCHERTZER Sr. and look forward to participating in their care.  A copy of this report was sent to the requesting provider on this date.  Electronically Signed: Joaquim Nam, PA-C 10/26/2019, 8:24 AM   I spent a total of  15 Minutesin face to face in clinical consultation, greater than 50% of which was counseling/coordinating care for bone marrow biopsy.

## 2019-10-26 NOTE — Procedures (Signed)
Pancytopenia  S/p CT BM ASP AND CORE BX  No comp Stable ebl min Path pending Full report in pacs  

## 2019-10-26 NOTE — Discharge Instructions (Signed)
Moderate Conscious Sedation, Adult, Care After °These instructions provide you with information about caring for yourself after your procedure. Your health care provider may also give you more specific instructions. Your treatment has been planned according to current medical practices, but problems sometimes occur. Call your health care provider if you have any problems or questions after your procedure. °What can I expect after the procedure? °After your procedure, it is common: °· To feel sleepy for several hours. °· To feel clumsy and have poor balance for several hours. °· To have poor judgment for several hours. °· To vomit if you eat too soon. °Follow these instructions at home: °For at least 24 hours after the procedure: ° °· Do not: °? Participate in activities where you could fall or become injured. °? Drive. °? Use heavy machinery. °? Drink alcohol. °? Take sleeping pills or medicines that cause drowsiness. °? Make important decisions or sign legal documents. °? Take care of children on your own. °· Rest. °Eating and drinking °· Follow the diet recommended by your health care provider. °· If you vomit: °? Drink water, juice, or soup when you can drink without vomiting. °? Make sure you have little or no nausea before eating solid foods. °General instructions °· Have a responsible adult stay with you until you are awake and alert. °· Take over-the-counter and prescription medicines only as told by your health care provider. °· If you smoke, do not smoke without supervision. °· Keep all follow-up visits as told by your health care provider. This is important. °Contact a health care provider if: °· You keep feeling nauseous or you keep vomiting. °· You feel light-headed. °· You develop a rash. °· You have a fever. °Get help right away if: °· You have trouble breathing. °This information is not intended to replace advice given to you by your health care provider. Make sure you discuss any questions you have  with your health care provider. °Document Released: 09/08/2013 Document Revised: 10/31/2017 Document Reviewed: 03/09/2016 °Elsevier Patient Education © 2020 [employer] °Needle Biopsy, Care After °These instructions tell you how to care for yourself after your procedure. Your doctor may also give you more specific instructions. Call your doctor if you have any problems or questions. °What can I expect after the procedure? °After the procedure, it is common to have: °· Soreness. °· Bruising. °· Mild pain. °Follow these instructions at home: ° °· Return to your normal activities as told by your doctor. Ask your doctor what activities are safe for you. °· Take over-the-counter and prescription medicines only as told by your doctor. °· Wash your hands with soap and water before you change your bandage (dressing). If you cannot use soap and water, use hand sanitizer. °· Follow instructions from your doctor about: °? How to take care of your puncture site. °? When and how to change your bandage. °? When to remove your bandage. °· Check your puncture site every day for signs of infection. Watch for: °? Redness, swelling, or pain. °? Fluid or blood.  °? Pus or a bad smell. °? Warmth. °· Do not take baths, swim, or use a hot tub until your doctor approves. Ask your doctor if you may take showers. You may only be allowed to take sponge baths. °· Keep all follow-up visits as told by your doctor. This is important. °Contact a doctor if you have: °· A fever. °· Redness, swelling, or pain at the puncture site, and it lasts longer than a few   days. °· Fluid, blood, or pus coming from the puncture site. °· Warmth coming from the puncture site. °Get help right away if: °· You have a lot of bleeding from the puncture site. °Summary °· After the procedure, it is common to have soreness, bruising, or mild pain at the puncture site. °· Check your puncture site every day for signs of infection, such as redness, swelling, or pain. °· Get  help right away if you have severe bleeding from your puncture site. °This information is not intended to replace advice given to you by your health care provider. Make sure you discuss any questions you have with your health care provider. °Document Released: 10/31/2008 Document Revised: 12/01/2017 Document Reviewed: 12/01/2017 °Elsevier Patient Education © 2020 [employer] ° ° ° ° ° °Moderate Conscious Sedation, Adult, Care After °These instructions provide you with information about caring for yourself after your procedure. Your health care provider may also give you more specific instructions. Your treatment has been planned according to current medical practices, but problems sometimes occur. Call your health care provider if you have any problems or questions after your procedure. °What can I expect after the procedure? °After your procedure, it is common: °· To feel sleepy for several hours. °· To feel clumsy and have poor balance for several hours. °· To have poor judgment for several hours. °· To vomit if you eat too soon. °Follow these instructions at home: °For at least 24 hours after the procedure: ° °· Do not: °? Participate in activities where you could fall or become injured. °? Drive. °? Use heavy machinery. °? Drink alcohol. °? Take sleeping pills or medicines that cause drowsiness. °? Make important decisions or sign legal documents. °? Take care of children on your own. °· Rest. °Eating and drinking °· Follow the diet recommended by your health care provider. °· If you vomit: °? Drink water, juice, or soup when you can drink without vomiting. °? Make sure you have little or no nausea before eating solid foods. °General instructions °· Have a responsible adult stay with you until you are awake and alert. °· Take over-the-counter and prescription medicines only as told by your health care provider. °· If you smoke, do not smoke without supervision. °· Keep all follow-up visits as told by your  health care provider. This is important. °Contact a health care provider if: °· You keep feeling nauseous or you keep vomiting. °· You feel light-headed. °· You develop a rash. °· You have a fever. °Get help right away if: °· You have trouble breathing. °This information is not intended to replace advice given to you by your health care provider. Make sure you discuss any questions you have with your health care provider. °Document Released: 09/08/2013 Document Revised: 10/31/2017 Document Reviewed: 03/09/2016 °Elsevier Patient Education © 2020 [employer] Inc. ° °

## 2019-10-29 ENCOUNTER — Other Ambulatory Visit: Payer: Self-pay

## 2019-11-01 LAB — SURGICAL PATHOLOGY

## 2019-11-03 ENCOUNTER — Other Ambulatory Visit: Payer: Self-pay

## 2019-11-03 ENCOUNTER — Inpatient Hospital Stay: Payer: Medicare Other | Attending: Oncology | Admitting: Oncology

## 2019-11-03 ENCOUNTER — Encounter (HOSPITAL_COMMUNITY): Payer: Self-pay | Admitting: Oncology

## 2019-11-03 ENCOUNTER — Telehealth: Payer: Self-pay

## 2019-11-03 ENCOUNTER — Encounter: Payer: Self-pay | Admitting: Oncology

## 2019-11-03 DIAGNOSIS — D72819 Decreased white blood cell count, unspecified: Secondary | ICD-10-CM | POA: Diagnosis not present

## 2019-11-03 DIAGNOSIS — D696 Thrombocytopenia, unspecified: Secondary | ICD-10-CM | POA: Diagnosis not present

## 2019-11-03 DIAGNOSIS — K219 Gastro-esophageal reflux disease without esophagitis: Secondary | ICD-10-CM | POA: Diagnosis not present

## 2019-11-03 DIAGNOSIS — M1990 Unspecified osteoarthritis, unspecified site: Secondary | ICD-10-CM | POA: Diagnosis not present

## 2019-11-03 DIAGNOSIS — F17210 Nicotine dependence, cigarettes, uncomplicated: Secondary | ICD-10-CM | POA: Insufficient documentation

## 2019-11-03 DIAGNOSIS — Z7982 Long term (current) use of aspirin: Secondary | ICD-10-CM | POA: Insufficient documentation

## 2019-11-03 DIAGNOSIS — I2510 Atherosclerotic heart disease of native coronary artery without angina pectoris: Secondary | ICD-10-CM | POA: Insufficient documentation

## 2019-11-03 DIAGNOSIS — I10 Essential (primary) hypertension: Secondary | ICD-10-CM | POA: Diagnosis not present

## 2019-11-03 DIAGNOSIS — Z79899 Other long term (current) drug therapy: Secondary | ICD-10-CM | POA: Insufficient documentation

## 2019-11-03 DIAGNOSIS — E785 Hyperlipidemia, unspecified: Secondary | ICD-10-CM | POA: Diagnosis not present

## 2019-11-03 NOTE — Telephone Encounter (Signed)
Made multiple attempts to contact patient to check him in for his virtual visit with Dr. Tasia Catchings today. Unable to reach patient. Left message.

## 2019-11-03 NOTE — Progress Notes (Signed)
Patient verified using two identifiers for virtual visit via telephone today.  Patient does not offer any problems today.  

## 2019-11-04 ENCOUNTER — Encounter: Payer: Self-pay | Admitting: Oncology

## 2019-11-04 NOTE — Progress Notes (Signed)
Please arrange Lab encounter in 4 weeks  Dear [name],  Cyctogentic testing was normal.  I recommend to repeat cbc in 4 weeks. If persistently low, I will obtain molecular testing/genome profiling.  Thank you  [name]

## 2019-11-04 NOTE — Progress Notes (Signed)
HEMATOLOGY-ONCOLOGY TeleHEALTH VISIT PROGRESS NOTE  I connected with Shawn Quest Hindle Sr. on 11/04/19 at  1:30 PM EST by video enabled telemedicine visit and verified that I am speaking with the correct person using two identifiers. I discussed the limitations, risks, security and privacy concerns of performing an evaluation and management service by telemedicine and the availability of in-person appointments. I also discussed with the patient that there may be a patient responsible charge related to this service. The patient expressed understanding and agreed to proceed.   Other persons participating in the visit and their role in the encounter:  None  Patient's location: Home  Provider's location: office Chief Complaint: Follow-up for discussion of bone marrow biopsy results.   INTERVAL HISTORY Shawn ALBARRAN Sr. is a 73 y.o. male who has above history reviewed by me today presents for follow up visit for management of leukopenia and thrombocytopenia. Problems and complaints are listed below:  Patient reports doing well today. Patient underwent bone marrow biopsy on 10/29/2019 for progressively worsening neutropenia and thrombocytopenia. Today he presents virtually to discuss results.  No new complaints. Denies constitutional symptoms.  Chronic fatigue.  Review of Systems  Constitutional: Positive for fatigue. Negative for appetite change, chills, fever and unexpected weight change.  HENT:   Negative for hearing loss and voice change.   Eyes: Negative for eye problems and icterus.  Respiratory: Negative for chest tightness, cough and shortness of breath.   Cardiovascular: Negative for chest pain and leg swelling.  Gastrointestinal: Negative for abdominal distention and abdominal pain.  Endocrine: Negative for hot flashes.  Genitourinary: Negative for difficulty urinating, dysuria and frequency.   Musculoskeletal: Negative for arthralgias.  Skin: Negative for itching and rash.   Neurological: Negative for light-headedness and numbness.  Hematological: Negative for adenopathy. Does not bruise/bleed easily.  Psychiatric/Behavioral: Negative for confusion.    Past Medical History:  Diagnosis Date  . Arthritis   . Basal cell carcinoma 1999   basal cell/skin  . CAD (coronary artery disease)   . Cataract Cataract surgery 2017  . GERD (gastroesophageal reflux disease)   . History of kidney stones 2013   passed stone on his own  . Hyperlipidemia   . Hypertension   . Shingles   . Ventricular tachycardia [facility])    Past Surgical History:  Procedure Laterality Date  . CARDIAC CATHETERIZATION N/A 01/16/2016   Procedure: Left Heart Cath and Coronary Angiography;  Surgeon: [name], MD;  Location: [facility];  Service: Cardiovascular;  Laterality: N/A;  . CATARACT EXTRACTION Left 2017  . CORONARY ANGIOPLASTY  2000 and 2003  . EYE SURGERY  2017   Cateract  . GANGLION CYST EXCISION  1979  . KNEE ARTHROSCOPY WITH MEDIAL MENISECTOMY Left 12/01/2017   Procedure: KNEE ARTHROSCOPY WITH MEDIAL MENISECTOMY;  Surgeon: [name], MD;  Location: [facility];  Service: Orthopedics;  Laterality: Left;  Partial  . LEFT HEART CATH Right 08/07/2017   Procedure: Left Heart Cath;  Surgeon: [name], MD;  Location: [place];  Service: Cardiovascular;  Laterality: Right;  . PILONIDAL CYST EXCISION  1977  . SKIN CANCER EXCISION  1999   ear  . SYNOVECTOMY Left 12/01/2017   Procedure: SYNOVECTOMY;  Surgeon: [name], MD;  Location: [facility];  Service: Orthopedics;  Laterality: Left;  Partial  . V TACH ABLATION N/A 08/14/2017   Procedure: V Tach Ablation;  Surgeon: [name], MD;  Location: [facility];  Service: Cardiovascular;  Laterality: N/A;  . VEIN SURGERY  2000   laser    Family History  Problem Relation Age of Onset  . Hypertension Mother   . Heart disease Mother   . Heart attack Mother   . Diabetes Mother   .  Alzheimer's disease Father   . Congestive Heart Failure Brother   . Hypertension Brother   . Other Brother        dementia  . Heart disease Brother   . Cancer Maternal Grandfather   . Brain cancer Daughter 70  . Cancer Daughter   . Cancer Maternal Uncle   . Heart disease Son     Social History   Socioeconomic History  . Marital status: Married    Spouse name: Not on file  . Number of children: 2  . Years of education: Not on file  . Highest education level: Master's degree (e.g., MA, MS, MEng, MEd, MSW, MBA)  Occupational History  . Occupation: retired  Scientific laboratory technician  . Financial resource strain: Not hard at all  . Food insecurity    Worry: Never true    Inability: Never true  . Transportation needs    Medical: No    Non-medical: No  Tobacco Use  . Smoking status: Former Smoker    Packs/day: 1.00    Years: 5.00    Pack years: 5.00    Types: Cigarettes    Quit date: 06/21/1971    Years since quitting: 48.4  . Smokeless tobacco: Former Systems developer    Types: [place] date: 06/20/1980  Substance and Sexual Activity  . Alcohol use: Not Currently    Alcohol/week: 2.0 standard drinks    Types: 2 Glasses of wine per week    Comment: 1-2 a week  . Drug use: No  . Sexual activity: Not Currently  Lifestyle  . Physical activity    Days per week: 5 days    Minutes per session: 30 min  . Stress: Patient refused  Relationships  . Social connections    Talks on phone: More than three times a week    Gets together: More than three times a week    Attends religious service: More than 4 times per year    Active member of club or organization: Yes    Attends meetings of clubs or organizations: More than 4 times per year    Relationship status: Married  . Intimate partner violence    Fear of current or ex partner: No    Emotionally abused: No    Physically abused: No    Forced sexual activity: No  Other Topics Concern  . Not on file  Social History Narrative   Lives in  [place]   Retired school principal    Current Outpatient Medications on File Prior to Visit  Medication Sig Dispense Refill  . acetaminophen (TYLENOL) 500 MG tablet Take 1,000 mg by mouth every 6 (six) hours as needed for moderate pain or headache.    Marland Kitchen aspirin EC 81 MG tablet Take 81 mg by mouth daily.    Marland Kitchen atorvastatin (LIPITOR) 80 MG tablet Take 1 tablet (80 mg total) by mouth at bedtime. 90 tablet 4  . fexofenadine (ALLEGRA) 180 MG tablet Take 1 tablet (180 mg total) by mouth daily. 90 tablet 4  . flecainide (TAMBOCOR) 100 MG tablet Take 2 tablets (200 mg total) as needed by mouth. As needed for recurrent tachycardia.  Do not repeat more than once per day or 3x  per wk 2 tablet 3  . metoprolol succinate (TOPROL-XL) 25 MG 24 hr tablet Take 1 tablet (25 mg total) by mouth daily. 90 tablet 2  . naproxen sodium (ALEVE) 220 MG tablet Take 220 mg by mouth 2 (two) times daily as needed (for pain or headache).    . niacin (NIASPAN) 1000 MG CR tablet Take 1 tablet (1,000 mg total) by mouth at bedtime. 90 tablet 4  . nitroGLYCERIN (NITROSTAT) 0.4 MG SL tablet nitroglycerin 0.4 mg subl    . pantoprazole (PROTONIX) 40 MG tablet Take 1 tablet (40 mg total) by mouth daily. 90 tablet 4  . ramipril (ALTACE) 5 MG capsule Take 1 capsule (5 mg total) by mouth daily. 30 capsule 0  . vitamin B-12 (CYANOCOBALAMIN) 1000 MCG tablet Take 1 tablet (1,000 mcg total) by mouth daily. 90 tablet 0   No current facility-administered medications on file prior to visit.     No Known Allergies     Observations/Objective: Today's Vitals   11/03/19 1553  PainSc: 0-No pain   There is no height or weight on file to calculate BMI.  Physical Exam  Constitutional: He is oriented to person, place, and time. No distress.  Pulmonary/Chest: Effort normal.  Neurological: He is alert and oriented to person, place, and time.  Psychiatric: Affect normal.    CBC    Component Value Date/Time   WBC 3.5 (L) 10/26/2019 0749    RBC 4.62 10/26/2019 0749   HGB 14.1 10/26/2019 0749   HGB 13.4 12/10/2018 0818   HCT 42.2 10/26/2019 0749   HCT 40.8 12/10/2018 0818   PLT 148 (L) 10/26/2019 0749   PLT 163 12/10/2018 0818   MCV 91.3 10/26/2019 0749   MCV 95 12/10/2018 0818   MCH 30.5 10/26/2019 0749   MCHC 33.4 10/26/2019 0749   RDW 13.3 10/26/2019 0749   RDW 12.8 12/10/2018 0818   LYMPHSABS 0.9 10/26/2019 0749   LYMPHSABS 0.6 (L) 12/10/2018 0818   MONOABS 0.5 10/26/2019 0749   EOSABS 0.3 10/26/2019 0749   EOSABS 0.2 12/10/2018 0818   BASOSABS 0.0 10/26/2019 0749   BASOSABS 0.0 12/10/2018 0818    CMP     Component Value Date/Time   NA 137 10/12/2019 1014   NA 137 05/31/2019 0856   K 4.4 10/12/2019 1014   CL 109 10/12/2019 1014   CO2 25 10/12/2019 1014   GLUCOSE 109 (H) 10/12/2019 1014   BUN 12 10/12/2019 1014   BUN 15 05/31/2019 0856   CREATININE 0.81 10/12/2019 1014   CALCIUM 9.6 10/12/2019 1014   CALCIUM 10.4 (H) 12/22/2018 1449   PROT 6.7 10/12/2019 1014   PROT 5.9 (L) 11/09/2018 1005   ALBUMIN 3.8 10/12/2019 1014   ALBUMIN 4.0 11/09/2018 1005   AST 22 10/12/2019 1014   AST 32 05/31/2019 0852   ALT 20 10/12/2019 1014   ALT 24 05/31/2019 0852   ALKPHOS 69 10/12/2019 1014   BILITOT 1.1 10/12/2019 1014   BILITOT 0.7 11/09/2018 1005   GFRNONAA >60 10/12/2019 1014   GFRAA >60 10/12/2019 1014     Assessment and Plan: 1. Leukopenia, unspecified type   2. Thrombocytopenia ([place])     #Marrow results was reviewed and discussed with patient. Bone marrow showed viable cellular bone marrow with trilineage hematopoiesis. Significant dyspoiesis or increase in blast cells is not identified.  Significant lymphoid aggregates are not seen. Immunohistochemistry stain for CD 138 and in situ hybridization for kappa and lambda showed polyclonal plasma cells staining pattern which is  most consistent with a reactive process. CBC on the day of bone marrow transfusion showed improving of neutropenia/leukopenia  and thrombocytopenia which is compatible with reactive process picture.  Patient is asymptomatic and clinically doing better. No constitutional symptoms.   Cytogenetics showed a normal karyotype. Repeat cbc in 4 weeks, if still low will send molecular testing . Plan to repeat CBC in 3 months to trend counts.  Follow Up Instructions:    I discussed the assessment and treatment plan with the patient. The patient was provided an opportunity to ask questions and all were answered. The patient agreed with the plan and demonstrated an understanding of the instructions.  The patient was advised to call back or seek an in-person evaluation if the symptoms worsen or if the condition fails to improve as anticipated.   I provided 16 minutes of face-to-face video visit time during this encounter, and > 50% was spent counseling as documented under my assessment & plan.  [name], MD 11/04/2019 11:34 PM

## 2019-11-05 ENCOUNTER — Telehealth: Payer: Self-pay | Admitting: *Deleted

## 2019-11-05 ENCOUNTER — Telehealth: Payer: Self-pay

## 2019-11-05 ENCOUNTER — Other Ambulatory Visit: Payer: Self-pay

## 2019-11-05 DIAGNOSIS — D72819 Decreased white blood cell count, unspecified: Secondary | ICD-10-CM

## 2019-11-05 NOTE — Telephone Encounter (Signed)
-----   Message from [name], MD sent at 11/04/2019 11:48 PM EST ----- Please arrange Lab encounter in 4 weeks  Dear Mr.Paragas,  Cyctogentic testing was normal.  I recommend to repeat cbc in 4 weeks. If persistently low, I will obtain molecular testing/genome profiling.  Thank you  [name]

## 2019-11-05 NOTE — Telephone Encounter (Signed)
-----   Message from [name], MD sent at 11/04/2019 11:48 PM EST ----- Please arrange Lab encounter in 4 weeks  Dear [name],  Cyctogentic testing was normal.  I recommend to repeat cbc in 4 weeks. If persistently low, I will obtain molecular testing/genome profiling.  Thank you  [name]

## 2019-11-05 NOTE — Telephone Encounter (Signed)
Per Dr.Yu to have pt to repeat labs in 4 weeks. A detailed message was left on pt vmail making him aware of the date and time of his scheduled 12/06/2019 lab appt.

## 2019-11-19 ENCOUNTER — Other Ambulatory Visit: Payer: Self-pay

## 2019-11-19 ENCOUNTER — Ambulatory Visit (INDEPENDENT_AMBULATORY_CARE_PROVIDER_SITE_OTHER): Payer: Medicare Other | Admitting: Family Medicine

## 2019-11-19 ENCOUNTER — Encounter: Payer: Self-pay | Admitting: Family Medicine

## 2019-11-19 VITALS — BP 123/80 | HR 65 | Temp 98.0°F | Ht 71.6 in | Wt 210.0 lb

## 2019-11-19 DIAGNOSIS — Z0000 Encounter for general adult medical examination without abnormal findings: Secondary | ICD-10-CM | POA: Diagnosis not present

## 2019-11-19 DIAGNOSIS — E7800 Pure hypercholesterolemia, unspecified: Secondary | ICD-10-CM

## 2019-11-19 DIAGNOSIS — N400 Enlarged prostate without lower urinary tract symptoms: Secondary | ICD-10-CM

## 2019-11-19 DIAGNOSIS — K2100 Gastro-esophageal reflux disease with esophagitis, without bleeding: Secondary | ICD-10-CM | POA: Diagnosis not present

## 2019-11-19 DIAGNOSIS — I10 Essential (primary) hypertension: Secondary | ICD-10-CM | POA: Diagnosis not present

## 2019-11-19 DIAGNOSIS — Z1211 Encounter for screening for malignant neoplasm of colon: Secondary | ICD-10-CM | POA: Diagnosis not present

## 2019-11-19 DIAGNOSIS — I2583 Coronary atherosclerosis due to lipid rich plaque: Secondary | ICD-10-CM

## 2019-11-19 DIAGNOSIS — I2510 Atherosclerotic heart disease of native coronary artery without angina pectoris: Secondary | ICD-10-CM

## 2019-11-19 LAB — UA/M W/RFLX CULTURE, ROUTINE
Bilirubin, UA: NEGATIVE
Glucose, UA: NEGATIVE
Leukocytes,UA: NEGATIVE
Nitrite, UA: NEGATIVE
Protein,UA: NEGATIVE
RBC, UA: NEGATIVE
Specific Gravity, UA: 1.025 (ref 1.005–1.030)
Urobilinogen, Ur: 1 mg/dL (ref 0.2–1.0)
pH, UA: 5.5 (ref 5.0–7.5)

## 2019-11-19 MED ORDER — PANTOPRAZOLE SODIUM 40 MG PO TBEC: 40.0000 mg | DELAYED_RELEASE_TABLET | Freq: Every day | ORAL | 3 refills | Status: DC

## 2019-11-19 MED ORDER — NIACIN ER (ANTIHYPERLIPIDEMIC) 1000 MG PO TBCR: 1000.0000 mg | EXTENDED_RELEASE_TABLET | Freq: Every day | ORAL | 1 refills | Status: DC

## 2019-11-19 MED ORDER — ATORVASTATIN CALCIUM 80 MG PO TABS: 80.0000 mg | ORAL_TABLET | Freq: Every day | ORAL | 1 refills | Status: DC

## 2019-11-19 MED ORDER — FEXOFENADINE HCL 180 MG PO TABS: 180.0000 mg | ORAL_TABLET | Freq: Every day | ORAL | 3 refills | Status: DC

## 2019-11-19 NOTE — Progress Notes (Signed)
BP 123/80   Pulse 65   Temp 98 F (36.7 C) (Oral)   Ht 5' 11.6" (1.819 m)   Wt 210 lb (95.3 kg)   SpO2 96%   BMI 28.80 kg/m    Subjective:    Patient ID: Shawn Kitchens Sr., male    DOB: [birth year], 73 y.o.   MRN: [id number]  HPI: Shawn WEHNER Sr. is a 73 y.o. male presenting on 11/19/2019 for comprehensive medical examination. Current medical complaints include:see below  Following with Hematology for leukopenia, recent bone marrow bx last month neg for leukemia, CT abdomen this year benign. Feeling well, no fevers, night sweats, weight loss, weakness.  Home BPs running 120s/80s consistently. Taking his medicines faithfully without side effects.   Following closely with Cardiology for HLD, CAD and on aspirin, high dose lipitor, metoprolol, and niacin. No recent episodes of CP, SOB, dizziness.   BPH no new urinary issues or sxs. PSAs have been stable.   He currently lives with: Interim Problems from his last visit: no  Depression Screen done today and results listed below:  Depression screen [facility] 2/9 10/07/2019 10/01/2018 05/04/2018 10/17/2017 09/11/2017  Decreased Interest 0 0 0 0 0  Down, Depressed, Hopeless 0 0 0 0 0  PHQ - 2 Score 0 0 0 0 0  Altered sleeping - 1 - 0 -  Tired, decreased energy - 1 - 0 -  Change in appetite - 0 - 0 -  Feeling bad or failure about yourself  - 0 - 0 -  Trouble concentrating - 0 - 0 -  Moving slowly or fidgety/restless - 0 - 0 -  Suicidal thoughts - 0 - 0 -  PHQ-9 Score - 2 - 0 -    The patient does not have a history of falls. I did complete a risk assessment for falls. A plan of care for falls was documented.   Past Medical History:  Past Medical History:  Diagnosis Date  . Arthritis   . Basal cell carcinoma 1999   basal cell/skin  . CAD (coronary artery disease)   . Cataract Cataract surgery 2017  . GERD (gastroesophageal reflux disease)   . History of bone marrow biopsy   . History of kidney stones 2013   passed stone  on his own  . Hyperlipidemia   . Hypertension   . Shingles   . Ventricular tachycardia [facility])     Surgical History:  Past Surgical History:  Procedure Laterality Date  . CARDIAC CATHETERIZATION N/A 01/16/2016   Procedure: Left Heart Cath and Coronary Angiography;  Surgeon: [name], MD;  Location: [place] [facility];  Service: Cardiovascular;  Laterality: N/A;  . CATARACT EXTRACTION Left 2017  . CORONARY ANGIOPLASTY  2000 and 2003  . EYE SURGERY  2017   Cateract  . GANGLION CYST EXCISION  1979  . KNEE ARTHROSCOPY WITH MEDIAL MENISECTOMY Left 12/01/2017   Procedure: KNEE ARTHROSCOPY WITH MEDIAL MENISECTOMY;  Surgeon: [name], MD;  Location: [facility] OR[facility];  Service: Orthopedics;  Laterality: Left;  Partial  . LEFT HEART CATH Right 08/07/2017   Procedure: Left Heart Cath;  Surgeon: [name], MD;  Location: [place] [facility];  Service: Cardiovascular;  Laterality: Right;  . PILONIDAL CYST EXCISION  1977  . SKIN CANCER EXCISION  1999   ear  . SYNOVECTOMY Left 12/01/2017   Procedure: SYNOVECTOMY;  Surgeon: [name], MD;  Location: [facility] OR[facility];  Service: Orthopedics;  Laterality: Left;  Partial  .  V TACH ABLATION N/A 08/14/2017   Procedure: V Tach Ablation;  Surgeon: [name], MD;  Location: [place] [facility];  Service: Cardiovascular;  Laterality: N/A;  . VEIN SURGERY  2000   laser    Medications:  Current Outpatient Medications on File Prior to Visit  Medication Sig  . acetaminophen (TYLENOL) 500 MG tablet Take 1,000 mg by mouth every 6 (six) hours as needed for moderate pain or headache.  Marland Kitchen aspirin EC 81 MG tablet Take 81 mg by mouth daily.  . flecainide (TAMBOCOR) 100 MG tablet Take 2 tablets (200 mg total) as needed by mouth. As needed for recurrent tachycardia.  Do not repeat more than once per day or 3x per wk  . metoprolol succinate (TOPROL-XL) 25 MG 24 hr tablet Take 1 tablet (25 mg total) by mouth daily.  . naproxen sodium (ALEVE) 220 MG  tablet Take 220 mg by mouth 2 (two) times daily as needed (for pain or headache).  . ramipril (ALTACE) 5 MG capsule Take 1 capsule (5 mg total) by mouth daily.  . vitamin B-12 (CYANOCOBALAMIN) 1000 MCG tablet Take 1 tablet (1,000 mcg total) by mouth daily.  . nitroGLYCERIN (NITROSTAT) 0.4 MG SL tablet nitroglycerin 0.4 mg subl   No current facility-administered medications on file prior to visit.    Allergies:  No Known Allergies  Social History:  Social History   Socioeconomic History  . Marital status: Married    Spouse name: Not on file  . Number of children: 2  . Years of education: Not on file  . Highest education level: Master's degree (e.g., MA, MS, MEng, MEd, MSW, MBA)  Occupational History  . Occupation: retired  Tobacco Use  . Smoking status: Former Smoker    Packs/day: 1.00    Years: 5.00    Pack years: 5.00    Types: Cigarettes    Quit date: 06/21/1971    Years since quitting: 48.4  . Smokeless tobacco: Former Systems developer    Types: [place] date: 06/20/1980  Substance and Sexual Activity  . Alcohol use: Not Currently    Alcohol/week: 2.0 standard drinks    Types: 2 Glasses of wine per week    Comment: 1-2 a week  . Drug use: No  . Sexual activity: Not Currently  Other Topics Concern  . Not on file  Social History Narrative   Lives in [place]   Retired school principal   Social Determinants of Health   Financial Resource Strain:   . Difficulty of Paying Living Expenses: Not on file  Food Insecurity:   . Worried About Charity fundraiser in the Last Year: Not on file  . Ran Out of Food in the Last Year: Not on file  Transportation Needs:   . Lack of Transportation (Medical): Not on file  . Lack of Transportation (Non-Medical): Not on file  Physical Activity:   . Days of Exercise per Week: Not on file  . Minutes of Exercise per Session: Not on file  Stress:   . Feeling of Stress : Not on file  Social Connections:   . Frequency of Communication with  Friends and Family: Not on file  . Frequency of Social Gatherings with Friends and Family: Not on file  . Attends Religious Services: Not on file  . Active Member of Clubs or Organizations: Not on file  . Attends Archivist Meetings: Not on file  . Marital Status: Not on file  Intimate Partner  Violence:   . Fear of Current or Ex-Partner: Not on file  . Emotionally Abused: Not on file  . Physically Abused: Not on file  . Sexually Abused: Not on file   Social History   Tobacco Use  Smoking Status Former Smoker  . Packs/day: 1.00  . Years: 5.00  . Pack years: 5.00  . Types: Cigarettes  . Quit date: 06/21/1971  . Years since quitting: 48.4  Smokeless Tobacco Former Systems developer  . Types: Chew  . Quit date: 06/20/1980   Social History   Substance and Sexual Activity  Alcohol Use Not Currently  . Alcohol/week: 2.0 standard drinks  . Types: 2 Glasses of wine per week   Comment: 1-2 a week    Family History:  Family History  Problem Relation Age of Onset  . Hypertension Mother   . Heart disease Mother   . Heart attack Mother   . Diabetes Mother   . Alzheimer's disease Father   . Congestive Heart Failure Brother   . Hypertension Brother   . Other Brother        dementia  . Heart disease Brother   . Cancer Maternal Grandfather   . Brain cancer Daughter 12  . Cancer Daughter   . Cancer Maternal Uncle   . Heart disease Son     Past medical history, surgical history, medications, allergies, family history and social history reviewed with patient today and changes made to appropriate areas of the chart.   Review of Systems - General ROS: negative Psychological ROS: negative Ophthalmic ROS: negative ENT ROS: negative Allergy and Immunology ROS: negative Hematological and Lymphatic ROS: negative Endocrine ROS: negative Respiratory ROS: no cough, shortness of breath, or wheezing Cardiovascular ROS: no chest pain or dyspnea on exertion Gastrointestinal ROS: no  abdominal pain, change in bowel habits, or black or bloody stools Genito-Urinary ROS: no dysuria, trouble voiding, or hematuria Musculoskeletal ROS: negative Neurological ROS: no TIA or stroke symptoms Dermatological ROS: negative All other ROS negative except what is listed above and in the HPI.      Objective:    BP 123/80   Pulse 65   Temp 98 F (36.7 C) (Oral)   Ht 5' 11.6" (1.819 m)   Wt 210 lb (95.3 kg)   SpO2 96%   BMI 28.80 kg/m   Wt Readings from Last 3 Encounters:  11/19/19 210 lb (95.3 kg)  10/26/19 205 lb (93 kg)  10/12/19 210 lb 11.2 oz (95.6 kg)    Physical Exam Vitals and nursing note reviewed.  Constitutional:      General: He is not in acute distress.    Appearance: He is well-developed.  HENT:     Head: Atraumatic.     Right Ear: Tympanic membrane and external ear normal.     Left Ear: Tympanic membrane and external ear normal.     Nose: Nose normal.     Mouth/Throat:     Mouth: Mucous membranes are moist.     Pharynx: Oropharynx is clear.  Eyes:     General: No scleral icterus.    Conjunctiva/sclera: Conjunctivae normal.     Pupils: Pupils are equal, round, and reactive to light.  Cardiovascular:     Rate and Rhythm: Normal rate and regular rhythm.     Heart sounds: Normal heart sounds. No murmur.  Pulmonary:     Effort: Pulmonary effort is normal. No respiratory distress.     Breath sounds: Normal breath sounds.  Abdominal:  General: Bowel sounds are normal. There is no distension.     Palpations: Abdomen is soft. There is no mass.     Tenderness: There is no abdominal tenderness. There is no guarding.  Genitourinary:    Rectum: Normal.     Comments: Prostate mildly enlarged, no nodules or asymmetry palpable Musculoskeletal:        General: No tenderness. Normal range of motion.     Cervical back: Normal range of motion and neck supple.  Skin:    General: Skin is warm and dry.     Findings: No rash.  Neurological:     General: No  focal deficit present.     Mental Status: He is alert and oriented to person, place, and time.     Deep Tendon Reflexes: Reflexes are normal and symmetric.  Psychiatric:        Mood and Affect: Mood normal.        Behavior: Behavior normal.        Thought Content: Thought content normal.        Judgment: Judgment normal.         Assessment & Plan:   Problem List Items Addressed This Visit      Cardiovascular and Mediastinum   CAD (coronary artery disease)    Followed by Cardiology, continue current regimen      Relevant Medications   niacin (NIASPAN) 1000 MG CR tablet   atorvastatin (LIPITOR) 80 MG tablet   Other Relevant Orders   Lipid Panel w/o Chol/HDL Ratio out   Essential hypertension - Primary    BPs stable and WNL, continue current regimen      Relevant Medications   niacin (NIASPAN) 1000 MG CR tablet   atorvastatin (LIPITOR) 80 MG tablet   Other Relevant Orders   CBC with Differential/Platelet out   Comprehensive metabolic panel   UA/M w/rflx Culture, Routine     Digestive   Reflux esophagitis    Stable on protonix, continue current regimen        Genitourinary   BPH (benign prostatic hyperplasia)    Stable, recheck PSA and continue to monitor      Relevant Orders   PSA     Other   Hyperlipidemia    Recheck labs, continue current regimen and good lifestyle habits      Relevant Medications   niacin (NIASPAN) 1000 MG CR tablet   atorvastatin (LIPITOR) 80 MG tablet    Other Visit Diagnoses    Annual physical exam       Colon cancer screening       Relevant Orders   Ambulatory referral to Gastroenterology       Discussed aspirin prophylaxis for myocardial infarction prevention and decision was made to continue ASA  LABORATORY TESTING:  Health maintenance labs ordered today as discussed above.   The natural history of prostate cancer and ongoing controversy regarding screening and potential treatment outcomes of prostate cancer has been  discussed with the patient. The meaning of a false positive PSA and a false negative PSA has been discussed. He indicates understanding of the limitations of this screening test and wishes to proceed with screening PSA testing.   IMMUNIZATIONS:   - Tdap: Tetanus vaccination status reviewed: postponed. - Influenza: Up to date - Pneumovax: Up to date - Prevnar: Up to date - HPV: Not applicable - Zostavax vaccine: Up to date  SCREENING: - Colonoscopy: Ordered today  Discussed with patient purpose of the colonoscopy is to detect colon  cancer at curable precancerous or early stages   PATIENT COUNSELING:    Sexuality: Discussed sexually transmitted diseases, partner selection, use of condoms, avoidance of unintended pregnancy  and contraceptive alternatives.   Advised to avoid cigarette smoking.  I discussed with the patient that most people either abstain from alcohol or drink within safe limits (<=14/week and <=4 drinks/occasion for males, <=7/weeks and <= 3 drinks/occasion for females) and that the risk for alcohol disorders and other health effects rises proportionally with the number of drinks per week and how often a drinker exceeds daily limits.  Discussed cessation/primary prevention of drug use and availability of treatment for abuse.   Diet: Encouraged to adjust caloric intake to maintain  or achieve ideal body weight, to reduce intake of dietary saturated fat and total fat, to limit sodium intake by avoiding high sodium foods and not adding table salt, and to maintain adequate dietary potassium and calcium preferably from fresh fruits, vegetables, and low-fat dairy products.    stressed the importance of regular exercise  Injury prevention: Discussed safety belts, safety helmets, smoke detector, smoking near bedding or upholstery.   Dental health: Discussed importance of regular tooth brushing, flossing, and dental visits.   Follow up plan: NEXT PREVENTATIVE PHYSICAL DUE IN 1  YEAR. Return in about 6 months (around 05/19/2020) for 6 month f/u.

## 2019-11-19 NOTE — Assessment & Plan Note (Signed)
Stable, recheck PSA and continue to monitor

## 2019-11-19 NOTE — Assessment & Plan Note (Signed)
BPs stable and WNL, continue current regimen 

## 2019-11-19 NOTE — Assessment & Plan Note (Signed)
Followed by Cardiology, continue current regimen

## 2019-11-19 NOTE — Assessment & Plan Note (Signed)
Recheck labs, continue current regimen and good lifestyle habits

## 2019-11-19 NOTE — Assessment & Plan Note (Signed)
Stable on protonix, continue current regimen

## 2019-11-20 LAB — CBC WITH DIFFERENTIAL/PLATELET
Basophils Absolute: 0 10*3/uL (ref 0.0–0.2)
Basos: 1 %
EOS (ABSOLUTE): 0.2 10*3/uL (ref 0.0–0.4)
Eos: 6 %
Hematocrit: 39.9 % (ref 37.5–51.0)
Hemoglobin: 13.9 g/dL (ref 13.0–17.7)
Immature Grans (Abs): 0 10*3/uL (ref 0.0–0.1)
Immature Granulocytes: 0 %
Lymphocytes Absolute: 0.8 10*3/uL (ref 0.7–3.1)
Lymphs: 28 %
MCH: 31.9 pg (ref 26.6–33.0)
MCHC: 34.8 g/dL (ref 31.5–35.7)
MCV: 92 fL (ref 79–97)
Monocytes Absolute: 0.5 10*3/uL (ref 0.1–0.9)
Monocytes: 15 %
Neutrophils Absolute: 1.5 10*3/uL (ref 1.4–7.0)
Neutrophils: 50 %
Platelets: 172 10*3/uL (ref 150–450)
RBC: 4.36 x10E6/uL (ref 4.14–5.80)
RDW: 12.5 % (ref 11.6–15.4)
WBC: 3 10*3/uL — ABNORMAL LOW (ref 3.4–10.8)

## 2019-11-20 LAB — COMPREHENSIVE METABOLIC PANEL
ALT: 18 IU/L (ref 0–44)
AST: 25 IU/L (ref 0–40)
Albumin/Globulin Ratio: 1.7 (ref 1.2–2.2)
Albumin: 4.1 g/dL (ref 3.7–4.7)
Alkaline Phosphatase: 90 IU/L (ref 39–117)
BUN/Creatinine Ratio: 16 (ref 10–24)
BUN: 13 mg/dL (ref 8–27)
Bilirubin Total: 0.7 mg/dL (ref 0.0–1.2)
CO2: 26 mmol/L (ref 20–29)
Calcium: 10.3 mg/dL — ABNORMAL HIGH (ref 8.6–10.2)
Chloride: 104 mmol/L (ref 96–106)
Creatinine, Ser: 0.8 mg/dL (ref 0.76–1.27)
GFR calc Af Amer: 103 mL/min/{1.73_m2} (ref >59)
GFR calc non Af Amer: 89 mL/min/{1.73_m2} (ref >59)
Globulin, Total: 2.4 g/dL (ref 1.5–4.5)
Glucose: 94 mg/dL (ref 65–99)
Potassium: 4.4 mmol/L (ref 3.5–5.2)
Sodium: 139 mmol/L (ref 134–144)
Total Protein: 6.5 g/dL (ref 6.0–8.5)

## 2019-11-20 LAB — PSA: Prostate Specific Ag, Serum: 0.4 ng/mL (ref 0.0–4.0)

## 2019-11-20 LAB — LIPID PANEL W/O CHOL/HDL RATIO
Cholesterol, Total: 124 mg/dL (ref 100–199)
HDL: 51 mg/dL (ref >39)
LDL Chol Calc (NIH): 56 mg/dL (ref 0–99)
Triglycerides: 90 mg/dL (ref 0–149)
VLDL Cholesterol Cal: 17 mg/dL (ref 5–40)

## 2019-11-22 ENCOUNTER — Telehealth: Payer: Self-pay | Admitting: Gastroenterology

## 2019-11-22 ENCOUNTER — Telehealth: Payer: Self-pay

## 2019-11-22 NOTE — Telephone Encounter (Signed)
Pt left vm returning a call for [employer]

## 2019-11-22 NOTE — Telephone Encounter (Signed)
error 

## 2019-11-22 NOTE — Telephone Encounter (Signed)
Called and left a message for call back  

## 2019-12-06 ENCOUNTER — Other Ambulatory Visit: Payer: Self-pay

## 2019-12-06 ENCOUNTER — Inpatient Hospital Stay: Payer: Medicare PPO | Attending: Oncology

## 2019-12-06 DIAGNOSIS — K219 Gastro-esophageal reflux disease without esophagitis: Secondary | ICD-10-CM | POA: Insufficient documentation

## 2019-12-06 DIAGNOSIS — I10 Essential (primary) hypertension: Secondary | ICD-10-CM | POA: Insufficient documentation

## 2019-12-06 DIAGNOSIS — Z7982 Long term (current) use of aspirin: Secondary | ICD-10-CM | POA: Diagnosis not present

## 2019-12-06 DIAGNOSIS — M1990 Unspecified osteoarthritis, unspecified site: Secondary | ICD-10-CM | POA: Insufficient documentation

## 2019-12-06 DIAGNOSIS — I2510 Atherosclerotic heart disease of native coronary artery without angina pectoris: Secondary | ICD-10-CM | POA: Diagnosis not present

## 2019-12-06 DIAGNOSIS — D696 Thrombocytopenia, unspecified: Secondary | ICD-10-CM | POA: Diagnosis not present

## 2019-12-06 DIAGNOSIS — Z79899 Other long term (current) drug therapy: Secondary | ICD-10-CM | POA: Diagnosis not present

## 2019-12-06 DIAGNOSIS — F17210 Nicotine dependence, cigarettes, uncomplicated: Secondary | ICD-10-CM | POA: Diagnosis not present

## 2019-12-06 DIAGNOSIS — E785 Hyperlipidemia, unspecified: Secondary | ICD-10-CM | POA: Diagnosis not present

## 2019-12-06 DIAGNOSIS — D72819 Decreased white blood cell count, unspecified: Secondary | ICD-10-CM | POA: Insufficient documentation

## 2019-12-06 LAB — CBC WITH DIFFERENTIAL/PLATELET
Abs Immature Granulocytes: 0 10*3/uL (ref 0.00–0.07)
Basophils Absolute: 0 10*3/uL (ref 0.0–0.1)
Basophils Relative: 1 %
Eosinophils Absolute: 0.2 10*3/uL (ref 0.0–0.5)
Eosinophils Relative: 5 %
HCT: 38.3 % — ABNORMAL LOW (ref 39.0–52.0)
Hemoglobin: 12.3 g/dL — ABNORMAL LOW (ref 13.0–17.0)
Immature Granulocytes: 0 %
Lymphocytes Relative: 26 %
Lymphs Abs: 0.8 10*3/uL (ref 0.7–4.0)
MCH: 30.4 pg (ref 26.0–34.0)
MCHC: 32.1 g/dL (ref 30.0–36.0)
MCV: 94.8 fL (ref 80.0–100.0)
Monocytes Absolute: 0.4 10*3/uL (ref 0.1–1.0)
Monocytes Relative: 15 %
Neutro Abs: 1.6 10*3/uL — ABNORMAL LOW (ref 1.7–7.7)
Neutrophils Relative %: 53 %
Platelets: 158 10*3/uL (ref 150–400)
RBC: 4.04 MIL/uL — ABNORMAL LOW (ref 4.22–5.81)
RDW: 13.3 % (ref 11.5–15.5)
WBC: 3 10*3/uL — ABNORMAL LOW (ref 4.0–10.5)
nRBC: 0 % (ref 0.0–0.2)

## 2019-12-10 ENCOUNTER — Encounter: Payer: Self-pay | Admitting: *Deleted

## 2019-12-16 ENCOUNTER — Other Ambulatory Visit: Payer: Self-pay

## 2019-12-16 ENCOUNTER — Ambulatory Visit (INDEPENDENT_AMBULATORY_CARE_PROVIDER_SITE_OTHER): Payer: Medicare PPO

## 2019-12-16 DIAGNOSIS — Z23 Encounter for immunization: Secondary | ICD-10-CM | POA: Diagnosis not present

## 2019-12-16 DIAGNOSIS — S61411A Laceration without foreign body of right hand, initial encounter: Secondary | ICD-10-CM

## 2020-01-02 ENCOUNTER — Ambulatory Visit: Payer: Medicare PPO

## 2020-01-07 ENCOUNTER — Ambulatory Visit: Payer: Medicare PPO

## 2020-01-09 ENCOUNTER — Ambulatory Visit: Payer: Medicare PPO

## 2020-01-31 ENCOUNTER — Other Ambulatory Visit: Payer: Self-pay

## 2020-01-31 ENCOUNTER — Inpatient Hospital Stay: Payer: Medicare PPO | Attending: Oncology

## 2020-01-31 DIAGNOSIS — M1990 Unspecified osteoarthritis, unspecified site: Secondary | ICD-10-CM | POA: Diagnosis not present

## 2020-01-31 DIAGNOSIS — I10 Essential (primary) hypertension: Secondary | ICD-10-CM | POA: Insufficient documentation

## 2020-01-31 DIAGNOSIS — Z7982 Long term (current) use of aspirin: Secondary | ICD-10-CM | POA: Diagnosis not present

## 2020-01-31 DIAGNOSIS — E785 Hyperlipidemia, unspecified: Secondary | ICD-10-CM | POA: Insufficient documentation

## 2020-01-31 DIAGNOSIS — K219 Gastro-esophageal reflux disease without esophagitis: Secondary | ICD-10-CM | POA: Insufficient documentation

## 2020-01-31 DIAGNOSIS — D696 Thrombocytopenia, unspecified: Secondary | ICD-10-CM

## 2020-01-31 DIAGNOSIS — Z85828 Personal history of other malignant neoplasm of skin: Secondary | ICD-10-CM | POA: Insufficient documentation

## 2020-01-31 DIAGNOSIS — Z79899 Other long term (current) drug therapy: Secondary | ICD-10-CM | POA: Diagnosis not present

## 2020-01-31 DIAGNOSIS — I2510 Atherosclerotic heart disease of native coronary artery without angina pectoris: Secondary | ICD-10-CM | POA: Insufficient documentation

## 2020-01-31 DIAGNOSIS — D72819 Decreased white blood cell count, unspecified: Secondary | ICD-10-CM | POA: Diagnosis not present

## 2020-01-31 LAB — CBC WITH DIFFERENTIAL/PLATELET
Abs Immature Granulocytes: 0.01 10*3/uL (ref 0.00–0.07)
Basophils Absolute: 0 10*3/uL (ref 0.0–0.1)
Basophils Relative: 0 %
Eosinophils Absolute: 0.2 10*3/uL (ref 0.0–0.5)
Eosinophils Relative: 6 %
HCT: 41.3 % (ref 39.0–52.0)
Hemoglobin: 13.3 g/dL (ref 13.0–17.0)
Immature Granulocytes: 0 %
Lymphocytes Relative: 26 %
Lymphs Abs: 0.8 10*3/uL (ref 0.7–4.0)
MCH: 30.4 pg (ref 26.0–34.0)
MCHC: 32.2 g/dL (ref 30.0–36.0)
MCV: 94.5 fL (ref 80.0–100.0)
Monocytes Absolute: 0.4 10*3/uL (ref 0.1–1.0)
Monocytes Relative: 15 %
Neutro Abs: 1.5 10*3/uL — ABNORMAL LOW (ref 1.7–7.7)
Neutrophils Relative %: 53 %
Platelets: 141 10*3/uL — ABNORMAL LOW (ref 150–400)
RBC: 4.37 MIL/uL (ref 4.22–5.81)
RDW: 13.2 % (ref 11.5–15.5)
WBC: 2.9 10*3/uL — ABNORMAL LOW (ref 4.0–10.5)
nRBC: 0 % (ref 0.0–0.2)

## 2020-02-01 ENCOUNTER — Encounter: Payer: Self-pay | Admitting: Oncology

## 2020-02-01 ENCOUNTER — Inpatient Hospital Stay (HOSPITAL_BASED_OUTPATIENT_CLINIC_OR_DEPARTMENT_OTHER): Payer: Medicare PPO | Admitting: Oncology

## 2020-02-01 DIAGNOSIS — D72819 Decreased white blood cell count, unspecified: Secondary | ICD-10-CM | POA: Diagnosis not present

## 2020-02-01 DIAGNOSIS — D696 Thrombocytopenia, unspecified: Secondary | ICD-10-CM

## 2020-02-01 NOTE — Progress Notes (Signed)
Patient contacted for Mychart visit. No concerns voiced. 

## 2020-02-03 NOTE — Progress Notes (Signed)
HEMATOLOGY-ONCOLOGY TeleHEALTH VISIT PROGRESS NOTE  I connected with Shawn Quest Colombo Sr. on 02/03/20 at  2:15 PM EST by video enabled telemedicine visit and verified that I am speaking with the correct person using two identifiers. I discussed the limitations, risks, security and privacy concerns of performing an evaluation and management service by telemedicine and the availability of in-person appointments. I also discussed with the patient that there may be a patient responsible charge related to this service. The patient expressed understanding and agreed to proceed.   Other persons participating in the visit and their role in the encounter:  None  Patient's location: Home  Provider's location: office Chief Complaint: Follow-up for thrombocytopenia and leukopenia.   INTERVAL HISTORY Shawn RAFANAN Sr. is a 74 y.o. male who has above history reviewed by me today presents for follow up visit for management of thrombocytopenia and leukopenia Problems and complaints are listed below:  Patient has no new complaints today.  Doing well.  Review of Systems  Constitutional: Negative for appetite change, chills, fatigue, fever and unexpected weight change.  HENT:   Negative for hearing loss and voice change.   Eyes: Negative for eye problems and icterus.  Respiratory: Negative for chest tightness, cough and shortness of breath.   Cardiovascular: Negative for chest pain and leg swelling.  Gastrointestinal: Negative for abdominal distention, abdominal pain and blood in stool.  Endocrine: Negative for hot flashes.  Genitourinary: Negative for difficulty urinating, dysuria and frequency.   Musculoskeletal: Negative for arthralgias.  Skin: Negative for itching and rash.  Neurological: Negative for extremity weakness, light-headedness and numbness.  Hematological: Negative for adenopathy. Does not bruise/bleed easily.  Psychiatric/Behavioral: Negative for confusion.    Past Medical History:   Diagnosis Date  . Arthritis   . Basal cell carcinoma 1999   basal cell/skin  . CAD (coronary artery disease)   . Cataract Cataract surgery 2017  . GERD (gastroesophageal reflux disease)   . History of bone marrow biopsy   . History of kidney stones 2013   passed stone on his own  . Hyperlipidemia   . Hypertension   . Shingles   . Ventricular tachycardia [facility])    Past Surgical History:  Procedure Laterality Date  . CARDIAC CATHETERIZATION N/A 01/16/2016   Procedure: Left Heart Cath and Coronary Angiography;  Surgeon: [name], MD;  Location: [place] [facility];  Service: Cardiovascular;  Laterality: N/A;  . CATARACT EXTRACTION Left 2017  . CORONARY ANGIOPLASTY  2000 and 2003  . EYE SURGERY  2017   Cateract  . GANGLION CYST EXCISION  1979  . KNEE ARTHROSCOPY WITH MEDIAL MENISECTOMY Left 12/01/2017   Procedure: KNEE ARTHROSCOPY WITH MEDIAL MENISECTOMY;  Surgeon: [name], MD;  Location: [facility];  Service: Orthopedics;  Laterality: Left;  Partial  . LEFT HEART CATH Right 08/07/2017   Procedure: Left Heart Cath;  Surgeon: [name], MD;  Location: [place] [facility];  Service: Cardiovascular;  Laterality: Right;  . PILONIDAL CYST EXCISION  1977  . SKIN CANCER EXCISION  1999   ear  . SYNOVECTOMY Left 12/01/2017   Procedure: SYNOVECTOMY;  Surgeon: [name], MD;  Location: [facility];  Service: Orthopedics;  Laterality: Left;  Partial  . V TACH ABLATION N/A 08/14/2017   Procedure: V Tach Ablation;  Surgeon: [name], MD;  Location: [place] [facility];  Service: Cardiovascular;  Laterality: N/A;  . VEIN SURGERY  2000   laser    Family History  Problem Relation Age of Onset  . Hypertension Mother   . Heart disease Mother   . Heart attack Mother   . Diabetes Mother   . Alzheimer's disease Father   . Congestive Heart Failure Brother   . Hypertension Brother   . Other Brother        dementia  . Heart disease Brother   . Cancer Maternal  Grandfather   . Brain cancer Daughter 62  . Cancer Daughter   . Cancer Maternal Uncle   . Heart disease Son     Social History   Socioeconomic History  . Marital status: Married    Spouse name: Not on file  . Number of children: 2  . Years of education: Not on file  . Highest education level: Master's degree (e.g., MA, MS, MEng, MEd, MSW, MBA)  Occupational History  . Occupation: retired  Tobacco Use  . Smoking status: Former Smoker    Packs/day: 1.00    Years: 5.00    Pack years: 5.00    Types: Cigarettes    Quit date: 06/21/1971    Years since quitting: 48.6  . Smokeless tobacco: Former Systems developer    Types: [place] date: 06/20/1980  Substance and Sexual Activity  . Alcohol use: Not Currently    Alcohol/week: 0.0 standard drinks  . Drug use: No  . Sexual activity: Not Currently  Other Topics Concern  . Not on file  Social History Narrative   Lives in [place]   Retired school principal   Social Determinants of Health   Financial Resource Strain:   . Difficulty of Paying Living Expenses: Not on file  Food Insecurity:   . Worried About Charity fundraiser in the Last Year: Not on file  . Ran Out of Food in the Last Year: Not on file  Transportation Needs:   . Lack of Transportation (Medical): Not on file  . Lack of Transportation (Non-Medical): Not on file  Physical Activity:   . Days of Exercise per Week: Not on file  . Minutes of Exercise per Session: Not on file  Stress:   . Feeling of Stress : Not on file  Social Connections:   . Frequency of Communication with Friends and Family: Not on file  . Frequency of Social Gatherings with Friends and Family: Not on file  . Attends Religious Services: Not on file  . Active Member of Clubs or Organizations: Not on file  . Attends Archivist Meetings: Not on file  . Marital Status: Not on file  Intimate Partner Violence:   . Fear of Current or Ex-Partner: Not on file  . Emotionally Abused: Not on file   . Physically Abused: Not on file  . Sexually Abused: Not on file    Current Outpatient Medications on File Prior to Visit  Medication Sig Dispense Refill  . acetaminophen (TYLENOL) 500 MG tablet Take 1,000 mg by mouth every 6 (six) hours as needed for moderate pain or headache.    Marland Kitchen aspirin EC 81 MG tablet Take 81 mg by mouth daily.    Marland Kitchen atorvastatin (LIPITOR) 80 MG tablet Take 1 tablet (80 mg total) by mouth at bedtime. 90 tablet 1  . fexofenadine (ALLEGRA) 180 MG tablet Take 1 tablet (180 mg total) by mouth daily. 90 tablet 3  . flecainide (TAMBOCOR) 100 MG tablet Take 2 tablets (200 mg total) as needed by mouth. As needed for recurrent tachycardia.  Do not repeat more than once  per day or 3x per wk 2 tablet 3  . metoprolol succinate (TOPROL-XL) 25 MG 24 hr tablet Take 1 tablet (25 mg total) by mouth daily. 90 tablet 2  . niacin (NIASPAN) 1000 MG CR tablet Take 1 tablet (1,000 mg total) by mouth at bedtime. 90 tablet 1  . nitroGLYCERIN (NITROSTAT) 0.4 MG SL tablet nitroglycerin 0.4 mg subl    . pantoprazole (PROTONIX) 40 MG tablet Take 1 tablet (40 mg total) by mouth daily. 90 tablet 3  . ramipril (ALTACE) 5 MG capsule Take 1 capsule (5 mg total) by mouth daily. 30 capsule 0  . vitamin B-12 (CYANOCOBALAMIN) 1000 MCG tablet Take 1 tablet (1,000 mcg total) by mouth daily. 90 tablet 0   No current facility-administered medications on file prior to visit.    No Known Allergies     Observations/Objective: There were no vitals filed for this visit. There is no height or weight on file to calculate BMI.  Physical Exam  Constitutional: No distress.  Neurological: He is alert.    CBC    Component Value Date/Time   WBC 2.9 (L) 01/31/2020 1111   RBC 4.37 01/31/2020 1111   HGB 13.3 01/31/2020 1111   HGB 13.9 11/19/2019 0947   HCT 41.3 01/31/2020 1111   HCT 39.9 11/19/2019 0947   PLT 141 (L) 01/31/2020 1111   PLT 172 11/19/2019 0947   MCV 94.5 01/31/2020 1111   MCV 92 11/19/2019 0947    MCH 30.4 01/31/2020 1111   MCHC 32.2 01/31/2020 1111   RDW 13.2 01/31/2020 1111   RDW 12.5 11/19/2019 0947   LYMPHSABS 0.8 01/31/2020 1111   LYMPHSABS 0.8 11/19/2019 0947   MONOABS 0.4 01/31/2020 1111   EOSABS 0.2 01/31/2020 1111   EOSABS 0.2 11/19/2019 0947   BASOSABS 0.0 01/31/2020 1111   BASOSABS 0.0 11/19/2019 0947    CMP     Component Value Date/Time   NA 139 11/19/2019 0947   K 4.4 11/19/2019 0947   CL 104 11/19/2019 0947   CO2 26 11/19/2019 0947   GLUCOSE 94 11/19/2019 0947   GLUCOSE 109 (H) 10/12/2019 1014   BUN 13 11/19/2019 0947   CREATININE 0.80 11/19/2019 0947   CALCIUM 10.3 (H) 11/19/2019 0947   CALCIUM 10.4 (H) 12/22/2018 1449   PROT 6.5 11/19/2019 0947   ALBUMIN 4.1 11/19/2019 0947   AST 25 11/19/2019 0947   AST 32 05/31/2019 0852   ALT 18 11/19/2019 0947   ALT 24 05/31/2019 0852   ALKPHOS 90 11/19/2019 0947   BILITOT 0.7 11/19/2019 0947   GFRNONAA 89 11/19/2019 0947   GFRAA 103 11/19/2019 0947     Assessment and Plan: 1. Leukopenia, unspecified type   2. Thrombocytopenia ([place])     Labs reviewed reviewed and discussed with the patient. 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  Stable counts. Previous bone marrow did not show significant dyspoiesis or increase in the blast.  Cytogenetics showed a normal karyotype. Recommend continue to observe.  Hold additional work-up at this point.  Possible medication side effects from pantoprazole.   Follow Up Instructions: 6 months   I discussed the assessment and treatment plan with the patient. The patient was provided an opportunity to ask questions and all were answered. The patient agreed with the plan and demonstrated an understanding of the instructions.  The patient was advised to call back or seek an in-person evaluation if the symptoms worsen or if the condition fails to  improve as anticipated.   [name], MD 02/03/2020 8:29 PM

## 2020-02-14 ENCOUNTER — Other Ambulatory Visit: Payer: Self-pay

## 2020-02-14 ENCOUNTER — Telehealth: Payer: Self-pay

## 2020-02-14 DIAGNOSIS — Z1211 Encounter for screening for malignant neoplasm of colon: Secondary | ICD-10-CM

## 2020-02-14 NOTE — Telephone Encounter (Signed)
Gastroenterology Pre-Procedure Review  Request Date: Monday 03/27/20 Requesting Physician: Dr. Allen Norris  PATIENT REVIEW QUESTIONS: The patient responded to the following health history questions as indicated:    1. Are you having any GI issues? no 2. Do you have a personal history of Polyps? no 3. Do you have a family history of Colon Cancer or Polyps? no 4. Diabetes Mellitus? no 5. Joint replacements in the past 12 months?no 6. Major health problems in the past 3 months?no 7. Any artificial heart valves, MVP, or defibrillator?no    MEDICATIONS & ALLERGIES:    Patient reports the following regarding taking any anticoagulation/antiplatelet therapy:   Plavix, Coumadin, Eliquis, Xarelto, Lovenox, Pradaxa, Brilinta, or Effient? yes (Aspirin 81 mg daily) Aspirin? yes (Aspirin 81 mg daily)  Patient confirms/reports the following medications:  Current Outpatient Medications  Medication Sig Dispense Refill  . acetaminophen (TYLENOL) 500 MG tablet Take 1,000 mg by mouth every 6 (six) hours as needed for moderate pain or headache.    Marland Kitchen aspirin EC 81 MG tablet Take 81 mg by mouth daily.    Marland Kitchen atorvastatin (LIPITOR) 80 MG tablet Take 1 tablet (80 mg total) by mouth at bedtime. 90 tablet 1  . fexofenadine (ALLEGRA) 180 MG tablet Take 1 tablet (180 mg total) by mouth daily. 90 tablet 3  . flecainide (TAMBOCOR) 100 MG tablet Take 2 tablets (200 mg total) as needed by mouth. As needed for recurrent tachycardia.  Do not repeat more than once per day or 3x per wk 2 tablet 3  . metoprolol succinate (TOPROL-XL) 25 MG 24 hr tablet Take 1 tablet (25 mg total) by mouth daily. 90 tablet 2  . niacin (NIASPAN) 1000 MG CR tablet Take 1 tablet (1,000 mg total) by mouth at bedtime. 90 tablet 1  . nitroGLYCERIN (NITROSTAT) 0.4 MG SL tablet nitroglycerin 0.4 mg subl    . pantoprazole (PROTONIX) 40 MG tablet Take 1 tablet (40 mg total) by mouth daily. 90 tablet 3  . ramipril (ALTACE) 5 MG capsule Take 1 capsule (5 mg  total) by mouth daily. 30 capsule 0  . vitamin B-12 (CYANOCOBALAMIN) 1000 MCG tablet Take 1 tablet (1,000 mcg total) by mouth daily. 90 tablet 0   No current facility-administered medications for this visit.    Patient confirms/reports the following allergies:  No Known Allergies  No orders of the defined types were placed in this encounter.   AUTHORIZATION INFORMATION Primary Insurance: 1D#: Group #:  Secondary Insurance: 1D#: Group #:  SCHEDULE INFORMATION: Date: [place] 03/27/20 Time: Location:

## 2020-02-22 ENCOUNTER — Other Ambulatory Visit: Payer: Self-pay

## 2020-02-22 NOTE — Telephone Encounter (Signed)
LOV: 11/19/19, NOV: 05/25/20

## 2020-02-23 MED ORDER — METOPROLOL SUCCINATE ER 25 MG PO TB24: 25.0000 mg | ORAL_TABLET | Freq: Every day | ORAL | 1 refills | Status: DC

## 2020-03-16 ENCOUNTER — Encounter: Payer: Self-pay | Admitting: Gastroenterology

## 2020-03-16 ENCOUNTER — Other Ambulatory Visit: Payer: Self-pay

## 2020-03-23 ENCOUNTER — Other Ambulatory Visit
Admission: RE | Admit: 2020-03-23 | Discharge: 2020-03-23 | Disposition: A | Discharge status: Home / Self Care | Payer: Medicare PPO | Source: Ambulatory Visit | Attending: Gastroenterology | Admitting: Gastroenterology

## 2020-03-23 ENCOUNTER — Other Ambulatory Visit: Payer: Self-pay

## 2020-03-23 DIAGNOSIS — Z20822 Contact with and (suspected) exposure to covid-19: Secondary | ICD-10-CM | POA: Diagnosis not present

## 2020-03-23 DIAGNOSIS — Z01812 Encounter for preprocedural laboratory examination: Secondary | ICD-10-CM | POA: Insufficient documentation

## 2020-03-23 LAB — SARS CORONAVIRUS 2 (TAT 6-24 HRS): SARS Coronavirus 2: NEGATIVE

## 2020-03-24 NOTE — Discharge Instructions (Signed)
General Anesthesia, Adult, Care After This sheet gives you information about how to care for yourself after your procedure. Your health care provider may also give you more specific instructions. If you have problems or questions, contact your health care provider. What can I expect after the procedure? After the procedure, the following side effects are common:  Pain or discomfort at the IV site.  Nausea.  Vomiting.  Sore throat.  Trouble concentrating.  Feeling cold or chills.  Weak or tired.  Sleepiness and fatigue.  Soreness and body aches. These side effects can affect parts of the body that were not involved in surgery. Follow these instructions at home:  For at least 24 hours after the procedure:  Have a responsible adult stay with you. It is important to have someone help care for you until you are awake and alert.  Rest as needed.  Do not: ? Participate in activities in which you could fall or become injured. ? Drive. ? Use heavy machinery. ? Drink alcohol. ? Take sleeping pills or medicines that cause drowsiness. ? Make important decisions or sign legal documents. ? Take care of children on your own. Eating and drinking  Follow any instructions from your health care provider about eating or drinking restrictions.  When you feel hungry, start by eating small amounts of foods that are soft and easy to digest (bland), such as toast. Gradually return to your regular diet.  Drink enough fluid to keep your urine pale yellow.  If you vomit, rehydrate by drinking water, juice, or clear broth. General instructions  If you have sleep apnea, surgery and certain medicines can increase your risk for breathing problems. Follow instructions from your health care provider about wearing your sleep device: ? Anytime you are sleeping, including during daytime naps. ? While taking prescription pain medicines, sleeping medicines, or medicines that make you drowsy.  Return to  your normal activities as told by your health care provider. Ask your health care provider what activities are safe for you.  Take over-the-counter and prescription medicines only as told by your health care provider.  If you smoke, do not smoke without supervision.  Keep all follow-up visits as told by your health care provider. This is important. Contact a health care provider if:  You have nausea or vomiting that does not get better with medicine.  You cannot eat or drink without vomiting.  You have pain that does not get better with medicine.  You are unable to pass urine.  You develop a skin rash.  You have a fever.  You have redness around your IV site that gets worse. Get help right away if:  You have difficulty breathing.  You have chest pain.  You have blood in your urine or stool, or you vomit blood. Summary  After the procedure, it is common to have a sore throat or nausea. It is also common to feel tired.  Have a responsible adult stay with you for the first 24 hours after general anesthesia. It is important to have someone help care for you until you are awake and alert.  When you feel hungry, start by eating small amounts of foods that are soft and easy to digest (bland), such as toast. Gradually return to your regular diet.  Drink enough fluid to keep your urine pale yellow.  Return to your normal activities as told by your health care provider. Ask your health care provider what activities are safe for you. This information is not   intended to replace advice given to you by your health care provider. Make sure you discuss any questions you have with your health care provider. Document Revised: 11/21/2017 Document Reviewed: 07/04/2017 Elsevier Patient Education  2020 Elsevier Inc.  

## 2020-03-27 ENCOUNTER — Encounter: Payer: Self-pay | Admitting: Gastroenterology

## 2020-03-27 ENCOUNTER — Ambulatory Visit
Admission: RE | Admit: 2020-03-27 | Discharge: 2020-03-27 | Disposition: A | Discharge status: Home / Self Care | Payer: Medicare PPO | Attending: Gastroenterology | Admitting: Gastroenterology

## 2020-03-27 ENCOUNTER — Ambulatory Visit: Payer: Medicare PPO | Admitting: Anesthesiology

## 2020-03-27 ENCOUNTER — Encounter
Admission: RE | Disposition: A | Discharge status: Home / Self Care | Payer: Self-pay | Source: Home / Self Care | Attending: Gastroenterology

## 2020-03-27 ENCOUNTER — Other Ambulatory Visit: Payer: Self-pay

## 2020-03-27 DIAGNOSIS — K635 Polyp of colon: Secondary | ICD-10-CM

## 2020-03-27 DIAGNOSIS — K219 Gastro-esophageal reflux disease without esophagitis: Secondary | ICD-10-CM | POA: Insufficient documentation

## 2020-03-27 DIAGNOSIS — D122 Benign neoplasm of ascending colon: Secondary | ICD-10-CM | POA: Insufficient documentation

## 2020-03-27 DIAGNOSIS — Z87891 Personal history of nicotine dependence: Secondary | ICD-10-CM | POA: Diagnosis not present

## 2020-03-27 DIAGNOSIS — I10 Essential (primary) hypertension: Secondary | ICD-10-CM | POA: Diagnosis not present

## 2020-03-27 DIAGNOSIS — I2510 Atherosclerotic heart disease of native coronary artery without angina pectoris: Secondary | ICD-10-CM | POA: Diagnosis not present

## 2020-03-27 DIAGNOSIS — Z79899 Other long term (current) drug therapy: Secondary | ICD-10-CM | POA: Diagnosis not present

## 2020-03-27 DIAGNOSIS — K5730 Diverticulosis of large intestine without perforation or abscess without bleeding: Secondary | ICD-10-CM | POA: Insufficient documentation

## 2020-03-27 DIAGNOSIS — Z85828 Personal history of other malignant neoplasm of skin: Secondary | ICD-10-CM | POA: Insufficient documentation

## 2020-03-27 DIAGNOSIS — Z1211 Encounter for screening for malignant neoplasm of colon: Secondary | ICD-10-CM

## 2020-03-27 DIAGNOSIS — E785 Hyperlipidemia, unspecified: Secondary | ICD-10-CM | POA: Insufficient documentation

## 2020-03-27 DIAGNOSIS — M1990 Unspecified osteoarthritis, unspecified site: Secondary | ICD-10-CM | POA: Insufficient documentation

## 2020-03-27 DIAGNOSIS — Z7982 Long term (current) use of aspirin: Secondary | ICD-10-CM | POA: Insufficient documentation

## 2020-03-27 HISTORY — PX: POLYPECTOMY: SHX5525

## 2020-03-27 HISTORY — DX: Decreased white blood cell count, unspecified: D72.819

## 2020-03-27 HISTORY — PX: COLONOSCOPY WITH PROPOFOL: SHX5780

## 2020-03-27 SURGERY — COLONOSCOPY WITH PROPOFOL
Anesthesia: General | Site: Rectum

## 2020-03-27 MED ORDER — ACETAMINOPHEN 160 MG/5ML PO SOLN: 325.0000 mg | ORAL | Status: DC | PRN

## 2020-03-27 MED ORDER — PROPOFOL 10 MG/ML IV BOLUS
INTRAVENOUS | Status: DC | PRN
  Administered 2020-03-27 (×3): 30 mg via INTRAVENOUS
  Administered 2020-03-27: 150 mg via INTRAVENOUS

## 2020-03-27 MED ORDER — LIDOCAINE HCL (CARDIAC) PF 100 MG/5ML IV SOSY
PREFILLED_SYRINGE | INTRAVENOUS | Status: DC | PRN
  Administered 2020-03-27: 30 mg via INTRAVENOUS

## 2020-03-27 MED ORDER — ACETAMINOPHEN 325 MG PO TABS: 325.0000 mg | ORAL_TABLET | ORAL | Status: DC | PRN

## 2020-03-27 MED ORDER — LACTATED RINGERS IV SOLN: INTRAVENOUS | Status: DC

## 2020-03-27 MED ORDER — ONDANSETRON HCL 4 MG/2ML IJ SOLN: 4.0000 mg | Freq: Once | INTRAMUSCULAR | Status: DC | PRN

## 2020-03-27 MED ORDER — STERILE WATER FOR IRRIGATION IR SOLN
Status: DC | PRN
  Administered 2020-03-27: 100 mL

## 2020-03-27 SURGICAL SUPPLY — 6 items
FORCEPS BIOP RAD 4 LRG CAP 4 (CUTTING FORCEPS) ×4 IMPLANT
GOWN CVR UNV OPN BCK APRN NK (MISCELLANEOUS) ×4 IMPLANT
GOWN ISOL THUMB LOOP REG UNIV (MISCELLANEOUS) ×4
KIT ENDO PROCEDURE OLY (KITS) ×4 IMPLANT
PROBE APC STR FIRE (PROBE) IMPLANT
WATER STERILE IRR 250ML POUR (IV SOLUTION) ×4 IMPLANT

## 2020-03-27 NOTE — Op Note (Signed)
[facility] Gastroenterology Patient Name: Shawn Shepard Procedure Date: 03/27/2020 9:36 AM MRN: [id number] Account #: [id number] Date of Birth: 1946-02-15 Admit Type: Outpatient Age: 74 Room: [facility] OR ROOM 01 Gender: Male Note Status: Finalized Procedure:             Colonoscopy Indications:           Screening for colorectal malignant neoplasm Providers:             [name] MD, MD Referring MD:          [name] (Referring MD) Medicines:             Propofol per Anesthesia Complications:         No immediate complications. Procedure:             Pre-Anesthesia Assessment:                        - Prior to the procedure, a History and Physical was                         performed, and patient medications and allergies were                         reviewed. The patient's tolerance of previous                         anesthesia was also reviewed. The risks and benefits                         of the procedure and the sedation options and risks                         were discussed with the patient. All questions were                         answered, and informed consent was obtained. Prior                         Anticoagulants: The patient has taken no previous                         anticoagulant or antiplatelet agents. ASA Grade                         Assessment: II - A patient with mild systemic disease.                         After reviewing the risks and benefits, the patient                         was deemed in satisfactory condition to undergo the                         procedure.                        After obtaining informed consent, the colonoscope was  passed under direct vision. Throughout the procedure,                         the patient's blood pressure, pulse, and oxygen                         saturations were monitored continuously. The                         Colonoscope was introduced through the  anus and                         advanced to the the cecum, identified by its                         appearance. The colonoscopy was performed without                         difficulty. The patient tolerated the procedure well.                         The quality of the bowel preparation was excellent. Findings:      The perianal and digital rectal examinations were normal.      A 3 mm polyp was found in the ascending colon. The polyp was sessile.       The polyp was removed with a cold biopsy forceps. Resection and       retrieval were complete.      Multiple small-mouthed diverticula were found in the sigmoid colon.      A localized area of mildly erythematous mucosa was found in the sigmoid       colon. This was biopsied with a cold forceps for histology. Impression:            - One 3 mm polyp in the ascending colon, removed with                         a cold biopsy forceps. Resected and retrieved.                        - Diverticulosis in the sigmoid colon.                        - Erythematous mucosa in the sigmoid colon. Biopsied. Recommendation:        - Discharge patient to home.                        - Resume previous diet.                        - Continue present medications.                        - Await pathology results. Procedure Code(s):     --- Professional ---                        [phone], Colonoscopy, flexible; with biopsy, single or  multiple Diagnosis Code(s):     --- Professional ---                        Z12.11, Encounter for screening for malignant neoplasm                         of colon                        K63.5, Polyp of colon                        K63.89, Other specified diseases of intestine CPT copyright 2019 American Medical Association. All rights reserved. The codes documented in this report are preliminary and upon coder review may  be revised to meet current compliance requirements. [name] MD, MD 03/27/2020  10:02:53 AM This report has been signed electronically. Number of Addenda: 0 Note Initiated On: 03/27/2020 9:36 AM Scope Withdrawal Time: 0 hours 9 minutes 29 seconds  Total Procedure Duration: 0 hours 15 minutes 7 seconds  Estimated Blood Loss:  Estimated blood loss: none.      [facility]

## 2020-03-27 NOTE — Anesthesia Preprocedure Evaluation (Addendum)
Anesthesia Evaluation  Patient identified by MRN, date of birth, ID band Patient awake    History of Anesthesia Complications Negative for: history of anesthetic complications  Airway Mallampati: I  TM Distance: >3 FB Neck ROM: Full    Dental no notable dental hx.    Pulmonary former smoker,    Pulmonary exam normal        Cardiovascular Exercise Tolerance: Good hypertension, Normal cardiovascular exam+ dysrhythmias (s/p VT ablation 2 years ago, no AC, no sxs)      Neuro/Psych negative neurological ROS     GI/Hepatic Neg liver ROS, GERD  Medicated,  Endo/Other  negative endocrine ROS  Renal/GU negative Renal ROS     Musculoskeletal negative musculoskeletal ROS (+)   Abdominal   Peds  Hematology negative hematology ROS (+)   Anesthesia Other Findings   Reproductive/Obstetrics                            Anesthesia Physical Anesthesia Plan  ASA: II  Anesthesia Plan: General   Post-op Pain Management:    Induction: Intravenous  PONV Risk Score and Plan: 2 and Propofol infusion, TIVA and Treatment may vary due to age or medical condition  Airway Management Planned: Nasal Cannula and Natural Airway  Additional Equipment: None  Intra-op Plan:   Post-operative Plan:   Informed Consent: I have reviewed the patients History and Physical, chart, labs and discussed the procedure including the risks, benefits and alternatives for the proposed anesthesia with the patient or authorized representative who has indicated his/her understanding and acceptance.       Plan Discussed with: CRNA  Anesthesia Plan Comments:         Anesthesia Quick Evaluation

## 2020-03-27 NOTE — Anesthesia Procedure Notes (Signed)
Performed by: Gatha Mcnulty, CRNA Pre-anesthesia Checklist: Patient identified, Emergency Drugs available, Suction available, Timeout performed and Patient being monitored Patient Re-evaluated:Patient Re-evaluated prior to induction Oxygen Delivery Method: Nasal cannula Placement Confirmation: positive ETCO2       

## 2020-03-27 NOTE — Anesthesia Postprocedure Evaluation (Signed)
Anesthesia Post Note  Patient: Shawn Quest Wedge Sr.  Procedure(s) Performed: COLONOSCOPY WITH PROPOFOL (N/A Rectum) POLYPECTOMY (Rectum)     Patient location during evaluation: PACU Anesthesia Type: General Level of consciousness: awake and alert Pain management: pain level controlled Vital Signs Assessment: post-procedure vital signs reviewed and stable Respiratory status: spontaneous breathing, nonlabored ventilation, respiratory function stable and patient connected to nasal cannula oxygen Cardiovascular status: blood pressure returned to baseline and stable Postop Assessment: no apparent nausea or vomiting Anesthetic complications: no    Adele Barthel Kenetha Cozza

## 2020-03-27 NOTE — H&P (Signed)
Shawn Lame, MD [address] [place], [place] [address] Phone: [phone] Fax : [phone]  Primary Care Physician:  [name], PA-C Primary Gastroenterologist:  Dr. Allen Norris  Pre-Procedure History & Physical: HPI:  Shawn Shepard Sr. is a 74 y.o. male is here for a screening colonoscopy.   Past Medical History:  Diagnosis Date  . Arthritis   . Basal cell carcinoma 1999   basal cell/skin  . CAD (coronary artery disease)   . Cataract Cataract surgery 2017  . GERD (gastroesophageal reflux disease)   . History of bone marrow biopsy   . History of kidney stones 2013   passed stone on his own  . Hyperlipidemia   . Hypertension   . Leukopenia   . Shingles   . Ventricular tachycardia [facility])     Past Surgical History:  Procedure Laterality Date  . CARDIAC CATHETERIZATION N/A 01/16/2016   Procedure: Left Heart Cath and Coronary Angiography;  Surgeon: [name], MD;  Location: [facility];  Service: Cardiovascular;  Laterality: N/A;  . CATARACT EXTRACTION Left 2017  . CORONARY ANGIOPLASTY  2000 and 2003  . EYE SURGERY  2017   Cateract  . GANGLION CYST EXCISION  1979  . KNEE ARTHROSCOPY WITH MEDIAL MENISECTOMY Left 12/01/2017   Procedure: KNEE ARTHROSCOPY WITH MEDIAL MENISECTOMY;  Surgeon: [name], MD;  Location: [facility] OR[facility];  Service: Orthopedics;  Laterality: Left;  Partial  . LEFT HEART CATH Right 08/07/2017   Procedure: Left Heart Cath;  Surgeon: [name], MD;  Location: [place] [facility];  Service: Cardiovascular;  Laterality: Right;  . PILONIDAL CYST EXCISION  1977  . SKIN CANCER EXCISION  1999   ear  . SYNOVECTOMY Left 12/01/2017   Procedure: SYNOVECTOMY;  Surgeon: [name], MD;  Location: [facility];  Service: Orthopedics;  Laterality: Left;  Partial  . V TACH ABLATION N/A 08/14/2017   Procedure: V Tach Ablation;  Surgeon: [name], MD;  Location: [place] [facility];  Service: Cardiovascular;   Laterality: N/A;  . VEIN SURGERY  2000   laser    Prior to Admission medications   Medication Sig Start Date End Date Taking? Authorizing Provider  acetaminophen (TYLENOL) 500 MG tablet Take 1,000 mg by mouth every 6 (six) hours as needed for moderate pain or headache.   Yes [provider]  aspirin EC 81 MG tablet Take 81 mg by mouth daily.   Yes [provider]  atorvastatin (LIPITOR) 80 MG tablet Take 1 tablet (80 mg total) by mouth at bedtime. 11/19/19  Yes [name], PA-C  fexofenadine (ALLEGRA) 180 MG tablet Take 1 tablet (180 mg total) by mouth daily. 11/19/19  Yes [name], PA-C  flecainide (TAMBOCOR) 100 MG tablet Take 2 tablets (200 mg total) as needed by mouth. As needed for recurrent tachycardia.  Do not repeat more than once per day or 3x per wk 10/13/17  Yes [name], [name], MD  metoprolol succinate (TOPROL-XL) 25 MG 24 hr tablet Take 1 tablet (25 mg total) by mouth daily. 02/23/20  Yes [name], PA-C  niacin (NIASPAN) 1000 MG CR tablet Take 1 tablet (1,000 mg total) by mouth at bedtime. 11/19/19  Yes [name], PA-C  nitroGLYCERIN (NITROSTAT) 0.4 MG SL tablet nitroglycerin 0.4 mg subl   Yes [provider]  pantoprazole (PROTONIX) 40 MG tablet Take 1 tablet (40 mg total) by mouth daily. 11/19/19  Yes [name],  PA-C  ramipril (ALTACE) 5 MG capsule Take 1 capsule (5 mg total) by mouth daily. 08/09/17  Yes [name], MD  vitamin B-12 (CYANOCOBALAMIN) 1000 MCG tablet Take 1 tablet (1,000 mcg total) by mouth daily. 01/12/19  Yes [name], MD    Allergies as of 02/14/2020  . (No Known Allergies)    Family History  Problem Relation Age of Onset  . Hypertension Mother   . Heart disease Mother   . Heart attack Mother   . Diabetes Mother   . Alzheimer's disease Father   . Congestive Heart Failure Brother   . Hypertension Brother   . Other Brother        dementia  . Heart disease  Brother   . Cancer Maternal Grandfather   . Brain cancer Daughter 51  . Cancer Daughter   . Cancer Maternal Uncle   . Heart disease Son     Social History   Socioeconomic History  . Marital status: Married    Spouse name: Not on file  . Number of children: 2  . Years of education: Not on file  . Highest education level: Master's degree (e.g., MA, MS, MEng, MEd, MSW, MBA)  Occupational History  . Occupation: retired  Tobacco Use  . Smoking status: Former Smoker    Packs/day: 1.00    Years: 5.00    Pack years: 5.00    Types: Cigarettes    Quit date: 06/21/1971    Years since quitting: 48.8  . Smokeless tobacco: Former Systems developer    Types: [place] date: 06/20/1980  Substance and Sexual Activity  . Alcohol use: Not Currently    Alcohol/week: 0.0 standard drinks  . Drug use: No  . Sexual activity: Not Currently  Other Topics Concern  . Not on file  Social History Narrative   Lives in [place]   Retired school principal   Social Determinants of Health   Financial Resource Strain:   . Difficulty of Paying Living Expenses:   Food Insecurity:   . Worried About Charity fundraiser in the Last Year:   . Arboriculturist in the Last Year:   Transportation Needs:   . Film/video editor (Medical):   Marland Kitchen Lack of Transportation (Non-Medical):   Physical Activity:   . Days of Exercise per Week:   . Minutes of Exercise per Session:   Stress:   . Feeling of Stress :   Social Connections:   . Frequency of Communication with Friends and Family:   . Frequency of Social Gatherings with Friends and Family:   . Attends Religious Services:   . Active Member of Clubs or Organizations:   . Attends Archivist Meetings:   Marland Kitchen Marital Status:   Intimate Partner Violence:   . Fear of Current or Ex-Partner:   . Emotionally Abused:   Marland Kitchen Physically Abused:   . Sexually Abused:     Review of Systems: See HPI, otherwise negative ROS  Physical Exam: BP (!) 119/97   Pulse 71    Temp (!) 97.2 F (36.2 C) (Temporal)   Resp 16   Ht 5' 11.6" (1.819 m)   Wt 93.4 kg   SpO2 99%   BMI 28.25 kg/m  General:   Alert,  pleasant and cooperative in NAD Head:  Normocephalic and atraumatic. Neck:  Supple; no masses or thyromegaly. Lungs:  Clear throughout to auscultation.    Heart:  Regular rate and rhythm. Abdomen:  Soft, nontender and  nondistended. Normal bowel sounds, without guarding, and without rebound.   Neurologic:  Alert and  oriented x4;  grossly normal neurologically.  Impression/Plan: [name] is now here to undergo a screening colonoscopy.  Risks, benefits, and alternatives regarding colonoscopy have been reviewed with the patient.  Questions have been answered.  All parties agreeable.

## 2020-03-27 NOTE — Transfer of Care (Signed)
Immediate Anesthesia Transfer of Care Note  Patient: Shawn Viana Lizardi Sr.  Procedure(s) Performed: COLONOSCOPY WITH PROPOFOL (N/A Rectum) POLYPECTOMY (Rectum)  Patient Location: PACU  Anesthesia Type: General  Level of Consciousness: awake, alert  and patient cooperative  Airway and Oxygen Therapy: Patient Spontanous Breathing and Patient connected to supplemental oxygen  Post-op Assessment: Post-op Vital signs reviewed, Patient's Cardiovascular Status Stable, Respiratory Function Stable, Patent Airway and No signs of Nausea or vomiting  Post-op Vital Signs: Reviewed and stable  Complications: No apparent anesthesia complications

## 2020-03-28 ENCOUNTER — Encounter: Payer: Self-pay | Admitting: *Deleted

## 2020-03-28 ENCOUNTER — Encounter: Payer: Self-pay | Admitting: Gastroenterology

## 2020-03-28 LAB — SURGICAL PATHOLOGY

## 2020-05-03 ENCOUNTER — Ambulatory Visit: Payer: Medicare PPO | Admitting: Oncology

## 2020-05-03 ENCOUNTER — Other Ambulatory Visit: Payer: Medicare PPO

## 2020-05-18 ENCOUNTER — Ambulatory Visit: Payer: Medicare Other | Admitting: Family Medicine

## 2020-05-25 ENCOUNTER — Encounter: Payer: Self-pay | Admitting: Family Medicine

## 2020-05-25 ENCOUNTER — Other Ambulatory Visit: Payer: Self-pay

## 2020-05-25 ENCOUNTER — Ambulatory Visit (INDEPENDENT_AMBULATORY_CARE_PROVIDER_SITE_OTHER): Payer: Medicare PPO | Admitting: Family Medicine

## 2020-05-25 VITALS — BP 112/71 | HR 64 | Temp 97.9°F | Wt 210.0 lb

## 2020-05-25 DIAGNOSIS — J3089 Other allergic rhinitis: Secondary | ICD-10-CM

## 2020-05-25 DIAGNOSIS — I10 Essential (primary) hypertension: Secondary | ICD-10-CM

## 2020-05-25 DIAGNOSIS — I2583 Coronary atherosclerosis due to lipid rich plaque: Secondary | ICD-10-CM

## 2020-05-25 DIAGNOSIS — R0789 Other chest pain: Secondary | ICD-10-CM

## 2020-05-25 DIAGNOSIS — I2510 Atherosclerotic heart disease of native coronary artery without angina pectoris: Secondary | ICD-10-CM

## 2020-05-25 DIAGNOSIS — E7800 Pure hypercholesterolemia, unspecified: Secondary | ICD-10-CM

## 2020-05-25 DIAGNOSIS — R42 Dizziness and giddiness: Secondary | ICD-10-CM

## 2020-05-25 MED ORDER — MONTELUKAST SODIUM 10 MG PO TABS
10.0000 mg | ORAL_TABLET | Freq: Every day | ORAL | 3 refills | Status: DC
Start: 2020-05-25 — End: 2020-11-28

## 2020-05-25 MED ORDER — AZELASTINE HCL 0.1 % NA SOLN: 1.0000 | Freq: Two times a day (BID) | NASAL | 11 refills | Status: DC

## 2020-05-25 MED ORDER — NIACIN ER (ANTIHYPERLIPIDEMIC) 1000 MG PO TBCR: 1000.0000 mg | EXTENDED_RELEASE_TABLET | Freq: Every day | ORAL | 1 refills | Status: DC

## 2020-05-25 MED ORDER — METOPROLOL SUCCINATE ER 25 MG PO TB24: 25.0000 mg | ORAL_TABLET | Freq: Every day | ORAL | 1 refills | Status: DC

## 2020-05-25 MED ORDER — ATORVASTATIN CALCIUM 80 MG PO TABS: 80.0000 mg | ORAL_TABLET | Freq: Every day | ORAL | 1 refills | Status: DC

## 2020-05-25 NOTE — Progress Notes (Signed)
BP 112/71   Pulse 64   Temp 97.9 F (36.6 C) (Oral)   Wt 210 lb (95.3 kg)   SpO2 98%   BMI 28.80 kg/m    Subjective:    Patient ID: Shawn Kitchens Sr., male    DOB: Dec 18, 1945, 74 y.o.   MRN: [id number]  HPI: Shawn PETIT Sr. is a 74 y.o. male  Chief Complaint  Patient presents with  . Hypertension  . Hyperlipidemia   Here today for 6 month f/u chronic conditions.   Several months of a left chest "sensation" that would only happen for several seconds at a time. Has not happened in at least a month now. Has a Cardiology appt scheduled for September and plans to discuss this at that time. Denies associated SOB, dizziness, diaphoresis, syncope. Known hx of HTN, CAD, and episode of ventricular tachycardia  Also noting episodes where he gets light headed and woozy every once in a while that often occurs while out doing things like golfing. Notes benefit with eating a piece of candy during these episodes. Tries to stay well hydrated.   Chronic post nasal drainage and cough, known hx of allergies. Takes allegra, benadryl and tussin just about daily which does help. Denies fever, chills, wheezing, SOB.   HTN - under good control, checks at home and typically 120/80 or lower. Taking medication faithfully without side effects. Taking lipitor and niacin for HLD, tolerating well. No myalgias, claudication.   Relevant past medical, surgical, family and social history reviewed and updated as indicated. Interim medical history since our last visit reviewed. Allergies and medications reviewed and updated.  Review of Systems  Per HPI unless specifically indicated above     Objective:    BP 112/71   Pulse 64   Temp 97.9 F (36.6 C) (Oral)   Wt 210 lb (95.3 kg)   SpO2 98%   BMI 28.80 kg/m   Wt Readings from Last 3 Encounters:  05/25/20 210 lb (95.3 kg)  03/27/20 206 lb (93.4 kg)  11/19/19 210 lb (95.3 kg)    Physical Exam Vitals and nursing note reviewed.  Constitutional:       Appearance: Normal appearance.  HENT:     Head: Atraumatic.  Eyes:     Extraocular Movements: Extraocular movements intact.     Conjunctiva/sclera: Conjunctivae normal.  Cardiovascular:     Rate and Rhythm: Normal rate and regular rhythm.  Pulmonary:     Effort: Pulmonary effort is normal.     Breath sounds: Normal breath sounds.  Musculoskeletal:        General: Normal range of motion.     Cervical back: Normal range of motion and neck supple.  Skin:    General: Skin is warm and dry.  Neurological:     General: No focal deficit present.     Mental Status: He is oriented to person, place, and time.  Psychiatric:        Mood and Affect: Mood normal.        Thought Content: Thought content normal.        Judgment: Judgment normal.     Results for orders placed or performed in visit on 05/25/20  Comprehensive metabolic panel  Result Value Ref Range   Glucose 97 65 - 99 mg/dL   BUN 12 8 - 27 mg/dL   Creatinine, Ser 0.99 0.76 - 1.27 mg/dL   GFR calc non Af Amer 76 >59 mL/min/1.73   GFR calc Af Amer 88 >59  mL/min/1.73   BUN/Creatinine Ratio 12 10 - 24   Sodium 141 134 - 144 mmol/L   Potassium 4.7 3.5 - 5.2 mmol/L   Chloride 106 96 - 106 mmol/L   CO2 26 20 - 29 mmol/L   Calcium 10.3 (H) 8.6 - 10.2 mg/dL   Total Protein 6.2 6.0 - 8.5 g/dL   Albumin 4.2 3.7 - 4.7 g/dL   Globulin, Total 2.0 1.5 - 4.5 g/dL   Albumin/Globulin Ratio 2.1 1.2 - 2.2   Bilirubin Total 0.8 0.0 - 1.2 mg/dL   Alkaline Phosphatase 79 48 - 121 IU/L   AST 24 0 - 40 IU/L   ALT 19 0 - 44 IU/L  Lipid Panel w/o Chol/HDL Ratio  Result Value Ref Range   Cholesterol, Total 127 100 - 199 mg/dL   Triglycerides 79 0 - 149 mg/dL   HDL 53 >39 mg/dL   VLDL Cholesterol Cal 16 5 - 40 mg/dL   LDL Chol Calc (NIH) 58 0 - 99 mg/dL      Assessment & Plan:   Problem List Items Addressed This Visit      Cardiovascular and Mediastinum   CAD (coronary artery disease)    Recheck lipids, adjust as needed.  Continue diet and exercise as well as current regimen      Relevant Medications   ramipril (ALTACE) 10 MG capsule   niacin (NIASPAN) 1000 MG CR tablet   metoprolol succinate (TOPROL-XL) 25 MG 24 hr tablet   atorvastatin (LIPITOR) 80 MG tablet   Essential hypertension - Primary    Stable and under good control, continue current regimen      Relevant Medications   ramipril (ALTACE) 10 MG capsule   niacin (NIASPAN) 1000 MG CR tablet   metoprolol succinate (TOPROL-XL) 25 MG 24 hr tablet   atorvastatin (LIPITOR) 80 MG tablet   Other Relevant Orders   Comprehensive metabolic panel (Completed)     Respiratory   Allergic rhinitis    Add singulair and astelin to antihistamine regimen, monitor for benefit with chronic cough and drainage        Other   Hyperlipidemia    Recheck lipids, continue current regimen      Relevant Medications   ramipril (ALTACE) 10 MG capsule   niacin (NIASPAN) 1000 MG CR tablet   metoprolol succinate (TOPROL-XL) 25 MG 24 hr tablet   atorvastatin (LIPITOR) 80 MG tablet   Other Relevant Orders   Lipid Panel w/o Chol/HDL Ratio (Completed)    Other Visit Diagnoses    Lightheadedness       Trial reducing simple carbs, increasing protein and fiber intake to help stabilize BS. Push fluids. F/u if spells more frequent or severe for further testing   Chest tightness       Declines EKG today, will address issue with Cardiologist at upcoming appt and call office sooner if occurring again       Follow up plan: Return in about 6 months (around 11/24/2020) for CPE.

## 2020-05-26 LAB — LIPID PANEL W/O CHOL/HDL RATIO
Cholesterol, Total: 127 mg/dL (ref 100–199)
HDL: 53 mg/dL (ref >39)
LDL Chol Calc (NIH): 58 mg/dL (ref 0–99)
Triglycerides: 79 mg/dL (ref 0–149)
VLDL Cholesterol Cal: 16 mg/dL (ref 5–40)

## 2020-05-26 LAB — COMPREHENSIVE METABOLIC PANEL
ALT: 19 IU/L (ref 0–44)
AST: 24 IU/L (ref 0–40)
Albumin/Globulin Ratio: 2.1 (ref 1.2–2.2)
Albumin: 4.2 g/dL (ref 3.7–4.7)
Alkaline Phosphatase: 79 IU/L (ref 48–121)
BUN/Creatinine Ratio: 12 (ref 10–24)
BUN: 12 mg/dL (ref 8–27)
Bilirubin Total: 0.8 mg/dL (ref 0.0–1.2)
CO2: 26 mmol/L (ref 20–29)
Calcium: 10.3 mg/dL — ABNORMAL HIGH (ref 8.6–10.2)
Chloride: 106 mmol/L (ref 96–106)
Creatinine, Ser: 0.99 mg/dL (ref 0.76–1.27)
GFR calc Af Amer: 88 mL/min/{1.73_m2} (ref >59)
GFR calc non Af Amer: 76 mL/min/{1.73_m2} (ref >59)
Globulin, Total: 2 g/dL (ref 1.5–4.5)
Glucose: 97 mg/dL (ref 65–99)
Potassium: 4.7 mmol/L (ref 3.5–5.2)
Sodium: 141 mmol/L (ref 134–144)
Total Protein: 6.2 g/dL (ref 6.0–8.5)

## 2020-05-28 DIAGNOSIS — J309 Allergic rhinitis, unspecified: Secondary | ICD-10-CM | POA: Insufficient documentation

## 2020-05-28 NOTE — Assessment & Plan Note (Signed)
Add singulair and astelin to antihistamine regimen, monitor for benefit with chronic cough and drainage

## 2020-05-28 NOTE — Assessment & Plan Note (Signed)
Stable and under good control, continue current regimen 

## 2020-05-28 NOTE — Assessment & Plan Note (Signed)
Recheck lipids, continue current regimen 

## 2020-05-28 NOTE — Assessment & Plan Note (Signed)
Recheck lipids, adjust as needed. Continue diet and exercise as well as current regimen

## 2020-07-13 ENCOUNTER — Encounter: Payer: Self-pay | Admitting: Family Medicine

## 2020-08-03 ENCOUNTER — Encounter: Payer: Self-pay | Admitting: Oncology

## 2020-08-03 ENCOUNTER — Other Ambulatory Visit: Payer: Self-pay

## 2020-08-03 ENCOUNTER — Inpatient Hospital Stay: Payer: Medicare PPO | Admitting: Oncology

## 2020-08-03 ENCOUNTER — Inpatient Hospital Stay: Payer: Medicare PPO | Attending: Oncology

## 2020-08-03 VITALS — BP 116/73 | HR 60 | Temp 97.0°F | Resp 16 | Wt 212.4 lb

## 2020-08-03 DIAGNOSIS — M1990 Unspecified osteoarthritis, unspecified site: Secondary | ICD-10-CM | POA: Diagnosis not present

## 2020-08-03 DIAGNOSIS — Z7982 Long term (current) use of aspirin: Secondary | ICD-10-CM | POA: Insufficient documentation

## 2020-08-03 DIAGNOSIS — D696 Thrombocytopenia, unspecified: Secondary | ICD-10-CM | POA: Diagnosis not present

## 2020-08-03 DIAGNOSIS — I10 Essential (primary) hypertension: Secondary | ICD-10-CM | POA: Insufficient documentation

## 2020-08-03 DIAGNOSIS — D72819 Decreased white blood cell count, unspecified: Secondary | ICD-10-CM

## 2020-08-03 DIAGNOSIS — E785 Hyperlipidemia, unspecified: Secondary | ICD-10-CM | POA: Insufficient documentation

## 2020-08-03 DIAGNOSIS — Z85828 Personal history of other malignant neoplasm of skin: Secondary | ICD-10-CM | POA: Diagnosis not present

## 2020-08-03 DIAGNOSIS — Z79899 Other long term (current) drug therapy: Secondary | ICD-10-CM | POA: Insufficient documentation

## 2020-08-03 DIAGNOSIS — I2510 Atherosclerotic heart disease of native coronary artery without angina pectoris: Secondary | ICD-10-CM | POA: Insufficient documentation

## 2020-08-03 DIAGNOSIS — Z87891 Personal history of nicotine dependence: Secondary | ICD-10-CM | POA: Diagnosis not present

## 2020-08-03 DIAGNOSIS — K219 Gastro-esophageal reflux disease without esophagitis: Secondary | ICD-10-CM | POA: Insufficient documentation

## 2020-08-03 LAB — CBC WITH DIFFERENTIAL/PLATELET
Abs Immature Granulocytes: 0.01 K/uL (ref 0.00–0.07)
Basophils Absolute: 0 K/uL (ref 0.0–0.1)
Basophils Relative: 1 %
Eosinophils Absolute: 0.2 K/uL (ref 0.0–0.5)
Eosinophils Relative: 6 %
HCT: 39.8 % (ref 39.0–52.0)
Hemoglobin: 13.5 g/dL (ref 13.0–17.0)
Immature Granulocytes: 0 %
Lymphocytes Relative: 24 %
Lymphs Abs: 0.7 K/uL (ref 0.7–4.0)
MCH: 31.3 pg (ref 26.0–34.0)
MCHC: 33.9 g/dL (ref 30.0–36.0)
MCV: 92.3 fL (ref 80.0–100.0)
Monocytes Absolute: 0.5 K/uL (ref 0.1–1.0)
Monocytes Relative: 15 %
Neutro Abs: 1.6 K/uL — ABNORMAL LOW (ref 1.7–7.7)
Neutrophils Relative %: 54 %
Platelets: 135 K/uL — ABNORMAL LOW (ref 150–400)
RBC: 4.31 MIL/uL (ref 4.22–5.81)
RDW: 13.5 % (ref 11.5–15.5)
WBC: 3.1 K/uL — ABNORMAL LOW (ref 4.0–10.5)
nRBC: 0 % (ref 0.0–0.2)

## 2020-08-03 LAB — COMPREHENSIVE METABOLIC PANEL WITH GFR
ALT: 22 U/L (ref 0–44)
AST: 23 U/L (ref 15–41)
Albumin: 3.8 g/dL (ref 3.5–5.0)
Alkaline Phosphatase: 59 U/L (ref 38–126)
Anion gap: 7 (ref 5–15)
BUN: 14 mg/dL (ref 8–23)
CO2: 27 mmol/L (ref 22–32)
Calcium: 9.6 mg/dL (ref 8.9–10.3)
Chloride: 104 mmol/L (ref 98–111)
Creatinine, Ser: 0.9 mg/dL (ref 0.61–1.24)
GFR calc Af Amer: >60 mL/min (ref >60)
GFR calc non Af Amer: >60 mL/min (ref >60)
Glucose, Bld: 97 mg/dL (ref 70–99)
Potassium: 4.5 mmol/L (ref 3.5–5.1)
Sodium: 138 mmol/L (ref 135–145)
Total Bilirubin: 1.1 mg/dL (ref 0.3–1.2)
Total Protein: 6.4 g/dL — ABNORMAL LOW (ref 6.5–8.1)

## 2020-08-03 NOTE — Progress Notes (Signed)
Patient denies new problems/concerns today.   °

## 2020-08-03 NOTE — Progress Notes (Signed)
Hematology/Oncology follow-up note [facility] [phone] [phone]   Patient Care Team: [name], Hershal Coria as PCP - General (Family Medicine) [name], MD as Consulting Physician (Cardiology) [name], MD as Consulting Physician (Oncology)  REFERRING PROVIDER: [name], PA-C  CHIEF COMPLAINTS/REASON FOR VISIT:  Follow-up for management of leukopenia and thrombocytopenia  HISTORY OF PRESENTING ILLNESS:  Shawn Kitchens Sr. is a  74 y.o.  male with PMH listed below who was referred to me for evaluation of leukopenia. Reviewed patient's recent labs. Patient has low total WBC count was 2.6, predominantly absolute lymphocyte 0.6. Earlier lab lab done 11/2019 showed WBC 2.8, lymphocyte 0.7, neutrophil 1.4.  Previous lab records reviewed. 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 No aggravating or improving factors.  Associated symptoms:  denies fatigue, weight loss, fever, chills, frequent infection.  History hepatitis or HIV infection: Denies History of chronic liver disease: Denies History of blood transfusion: Denies Alcohol consumption: Denies Diet Vegetarian or Vegan: Denies Herbal medication: Denies  Reports feeling quite tired and fatigued recently.  He and his wife recently downsized to a one-story home and he has been moving a lot of furniture's by himself.  He feels it took a week for his knee to recover after the most. Denies any unintentional weight loss, fever or chills, night sweating. He is very active, walks every day. Previous occupation was a Product/process development scientist.  Drinks a cocktail every day.  Usually drinks cocktail with quinine tonic water for lower extremity cramps.  He started to do this after his ventricular tachycardic episodes about 1-1/2 years ago. Former smoker, quitting in 1972.  INTERVAL HISTORY Shawn KRYSIAK Sr. is a 74 y.o. male who has  above history reviewed by me today presents for follow up visit for management of leukopenia and thrombocytopenia. Problems and complaints are listed below: Patient reports feeling well. Patient has not drink alcohol for the past 1-1/2-year.  No using of chronic contained tonic water.  He denies any new complaints.  Denies any constitutional symptoms. He is active and exercises every day..  Review of Systems  Constitutional: Negative for chills, fever, malaise/fatigue and weight loss.  HENT: Negative for sore throat.   Eyes: Negative for redness.  Respiratory: Negative for cough, shortness of breath and wheezing.   Cardiovascular: Negative for chest pain, palpitations and leg swelling.  Gastrointestinal: Negative for abdominal pain, blood in stool, nausea and vomiting.  Genitourinary: Negative for dysuria.  Musculoskeletal: Negative for myalgias.  Skin: Negative for rash.  Neurological: Negative for dizziness, tingling and tremors.  Endo/Heme/Allergies: Does not bruise/bleed easily.  Psychiatric/Behavioral: Negative for hallucinations.    MEDICAL HISTORY:  Past Medical History:  Diagnosis Date  . Arthritis   . Basal cell carcinoma 1999   basal cell/skin  . CAD (coronary artery disease)   . Cataract Cataract surgery 2017  . GERD (gastroesophageal reflux disease)   . History of bone marrow biopsy   . History of kidney stones 2013   passed stone on his own  . Hyperlipidemia   . Hypertension   . Leukopenia   . Shingles   . Ventricular tachycardia ([place])     SURGICAL HISTORY: Past Surgical History:  Procedure Laterality Date  . CARDIAC CATHETERIZATION N/A 01/16/2016   Procedure: Left Heart Cath and Coronary Angiography;  Surgeon: [name], MD;  Location: [place] [facility];  Service: Cardiovascular;  Laterality: N/A;  . CATARACT EXTRACTION Left 2017  . COLONOSCOPY  WITH PROPOFOL N/A 03/27/2020   Procedure: COLONOSCOPY WITH PROPOFOL;  Surgeon: [name], MD;  Location:  [facility];  Service: Endoscopy;  Laterality: N/A;  priority 4  . CORONARY ANGIOPLASTY  2000 and 2003  . EYE SURGERY  2017   Cateract  . GANGLION CYST EXCISION  1979  . KNEE ARTHROSCOPY WITH MEDIAL MENISECTOMY Left 12/01/2017   Procedure: KNEE ARTHROSCOPY WITH MEDIAL MENISECTOMY;  Surgeon: [name], MD;  Location: [facility];  Service: Orthopedics;  Laterality: Left;  Partial  . LEFT HEART CATH Right 08/07/2017   Procedure: Left Heart Cath;  Surgeon: [name], MD;  Location: [place] [facility];  Service: Cardiovascular;  Laterality: Right;  . PILONIDAL CYST EXCISION  1977  . POLYPECTOMY  03/27/2020   Procedure: POLYPECTOMY;  Surgeon: [name], MD;  Location: [place];  Service: Endoscopy;;  . SKIN CANCER EXCISION  1999   ear  . SYNOVECTOMY Left 12/01/2017   Procedure: SYNOVECTOMY;  Surgeon: [name], MD;  Location: [facility];  Service: Orthopedics;  Laterality: Left;  Partial  . V TACH ABLATION N/A 08/14/2017   Procedure: V Tach Ablation;  Surgeon: [name], MD;  Location: [place] [facility];  Service: Cardiovascular;  Laterality: N/A;  . VEIN SURGERY  2000   laser    SOCIAL HISTORY: Social History   Socioeconomic History  . Marital status: Married    Spouse name: Not on file  . Number of children: 2  . Years of education: Not on file  . Highest education level: Master's degree (e.g., MA, MS, MEng, MEd, MSW, MBA)  Occupational History  . Occupation: retired  Tobacco Use  . Smoking status: Former Smoker    Packs/day: 1.00    Years: 5.00    Pack years: 5.00    Types: Cigarettes    Quit date: 06/21/1971    Years since quitting: 49.1  . Smokeless tobacco: Former Systems developer    Types: Chew    Quit date: 06/20/1980  Vaping Use  . Vaping Use: Never used  Substance and Sexual Activity  . Alcohol use: Not Currently    Alcohol/week: 0.0 standard drinks  . Drug use: No  . Sexual activity: Not Currently  Other Topics Concern  . Not on  file  Social History Narrative   Lives in [place]   Retired school principal   Social Determinants of Health   Financial Resource Strain:   . Difficulty of Paying Living Expenses: Not on file  Food Insecurity:   . Worried About Charity fundraiser in the Last Year: Not on file  . Ran Out of Food in the Last Year: Not on file  Transportation Needs:   . Lack of Transportation (Medical): Not on file  . Lack of Transportation (Non-Medical): Not on file  Physical Activity:   . Days of Exercise per Week: Not on file  . Minutes of Exercise per Session: Not on file  Stress:   . Feeling of Stress : Not on file  Social Connections:   . Frequency of Communication with Friends and Family: Not on file  . Frequency of Social Gatherings with Friends and Family: Not on file  . Attends Religious Services: Not on file  . Active Member of Clubs or Organizations: Not on file  . Attends Archivist Meetings: Not on file  . Marital Status: Not on file  Intimate Partner Violence:   . Fear of Current or Ex-Partner: Not on file  .  Emotionally Abused: Not on file  . Physically Abused: Not on file  . Sexually Abused: Not on file    FAMILY HISTORY: Family History  Problem Relation Age of Onset  . Hypertension Mother   . Heart disease Mother   . Heart attack Mother   . Diabetes Mother   . Alzheimer's disease Father   . Congestive Heart Failure Brother   . Hypertension Brother   . Other Brother        dementia  . Heart disease Brother   . Cancer Maternal Grandfather   . Brain cancer Daughter 75  . Cancer Daughter   . Cancer Maternal Uncle   . Heart disease Son     ALLERGIES:  has No Known Allergies.  MEDICATIONS:  Current Outpatient Medications  Medication Sig Dispense Refill  . acetaminophen (TYLENOL) 500 MG tablet Take 1,000 mg by mouth every 6 (six) hours as needed for moderate pain or headache.    Marland Kitchen aspirin EC 81 MG tablet Take 81 mg by mouth daily.    Marland Kitchen atorvastatin  (LIPITOR) 80 MG tablet Take 1 tablet (80 mg total) by mouth at bedtime. 90 tablet 1  . fexofenadine (ALLEGRA) 180 MG tablet Take 1 tablet (180 mg total) by mouth daily. 90 tablet 3  . flecainide (TAMBOCOR) 100 MG tablet Take 2 tablets (200 mg total) as needed by mouth. As needed for recurrent tachycardia.  Do not repeat more than once per day or 3x per wk 2 tablet 3  . metoprolol succinate (TOPROL-XL) 25 MG 24 hr tablet Take 1 tablet (25 mg total) by mouth daily. 90 tablet 1  . niacin (NIASPAN) 1000 MG CR tablet Take 1 tablet (1,000 mg total) by mouth at bedtime. 90 tablet 1  . pantoprazole (PROTONIX) 40 MG tablet Take 1 tablet (40 mg total) by mouth daily. 90 tablet 3  . ramipril (ALTACE) 10 MG capsule Take 10 mg by mouth daily.    . vitamin B-12 (CYANOCOBALAMIN) 1000 MCG tablet Take 1 tablet (1,000 mcg total) by mouth daily. 90 tablet 0  . azelastine (ASTELIN) 0.1 % nasal spray Place 1 spray into both nostrils 2 (two) times daily. Use in each nostril as directed (Patient not taking: Reported on 08/03/2020) 30 mL 11  . montelukast (SINGULAIR) 10 MG tablet Take 1 tablet (10 mg total) by mouth at bedtime. (Patient not taking: Reported on 08/03/2020) 90 tablet 3  . nitroGLYCERIN (NITROSTAT) 0.4 MG SL tablet nitroglycerin 0.4 mg subl (Patient not taking: Reported on 05/25/2020)     No current facility-administered medications for this visit.     PHYSICAL EXAMINATION: ECOG PERFORMANCE STATUS: 0 - Asymptomatic Vitals:   08/03/20 1134  BP: 116/73  Pulse: 60  Resp: 16  Temp: (!) 97 F (36.1 C)   Filed Weights   08/03/20 1134  Weight: 212 lb 6.4 oz (96.3 kg)    Physical Exam Constitutional:      General: He is not in acute distress. HENT:     Head: Normocephalic and atraumatic.  Eyes:     General: No scleral icterus.    Pupils: Pupils are equal, round, and reactive to light.  Cardiovascular:     Rate and Rhythm: Normal rate and regular rhythm.     Heart sounds: Normal heart sounds.   Pulmonary:     Effort: Pulmonary effort is normal. No respiratory distress.     Breath sounds: No wheezing.  Abdominal:     General: Bowel sounds are normal. There is no  distension.     Palpations: Abdomen is soft. There is no mass.     Tenderness: There is no abdominal tenderness.  Musculoskeletal:        General: No deformity. Normal range of motion.     Cervical back: Normal range of motion and neck supple.  Skin:    General: Skin is warm and dry.     Findings: No erythema or rash.  Neurological:     Mental Status: He is alert and oriented to person, place, and time.     Cranial Nerves: No cranial nerve deficit.     Coordination: Coordination normal.  Psychiatric:        Behavior: Behavior normal.        Thought Content: Thought content normal.      LABORATORY DATA:  I have reviewed the data as listed Lab Results  Component Value Date   WBC 3.1 (L) 08/03/2020   HGB 13.5 08/03/2020   HCT 39.8 08/03/2020   MCV 92.3 08/03/2020   PLT 135 (L) 08/03/2020   Recent Labs    11/19/19 0947 05/25/20 1015 08/03/20 1117  NA 139 141 138  K 4.4 4.7 4.5  CL 104 106 104  CO2 '26 26 27  ' GLUCOSE 94 97 97  BUN '13 12 14  ' CREATININE 0.80 0.99 0.90  CALCIUM 10.3* 10.3* 9.6  GFRNONAA 89 76 >60  GFRAA 103 88 >60  PROT 6.5 6.2 6.4*  ALBUMIN 4.1 4.2 3.8  AST '25 24 23  ' ALT '18 19 22  ' ALKPHOS 90 79 59  BILITOT 0.7 0.8 1.1   Iron/TIBC/Ferritin/ %Sat No results found for: IRON, TIBC, FERRITIN, IRONPCTSAT     ASSESSMENT & PLAN:  1. Leukopenia, unspecified type   2. Thrombocytopenia (HCC)    Chronic leukopenia, predominantly neutropenia And thrombocytopenia. Patient has had extensive work-up in the past including  Including normal smear, normal folate level, normal B12 level, hepatitis negative, negative HIV, negative flow cytometry.  Multiple myeloma panel was not remarkable except slightly increased IgM level.  Ultrasound abdomen showed normal spleen size. Normal ANA, CRP,  ESR. Patient has stopped alcohol use and not using any quinine-containing tonic water Labs are reviewed and discussed with patient.  Stable counts. Previous bone marrow biopsy on 10/26/2019 also showed no significant dyspoiesis or increasing blast cells.  No significant lymphoid aggregates.  Normal cytogenetics. Currently I will hold off additional work-up.  Counts are stable and he does not have any constitutional symptoms.  Check blood work and follow-up every 6 months.  He agrees with the plan.  Orders Placed This Encounter  Procedures  . CBC with Differential/Platelet    Standing Status:   Future    Standing Expiration Date:   08/03/2021  . Comprehensive metabolic panel    Standing Status:   Future    Standing Expiration Date:   08/03/2021  . Vitamin B12    Standing Status:   Future    Standing Expiration Date:   08/03/2021    We spent sufficient time to discuss many aspect of care, questions were answered to patient's satisfaction. [name], MD, PhD  08/03/2020

## 2020-08-23 ENCOUNTER — Other Ambulatory Visit: Payer: Medicare PPO

## 2020-08-23 ENCOUNTER — Other Ambulatory Visit: Payer: Self-pay

## 2020-09-07 ENCOUNTER — Other Ambulatory Visit: Payer: Self-pay | Admitting: Orthopaedic Surgery

## 2020-09-07 ENCOUNTER — Other Ambulatory Visit (HOSPITAL_COMMUNITY): Payer: Self-pay | Admitting: Orthopaedic Surgery

## 2020-09-07 DIAGNOSIS — R2231 Localized swelling, mass and lump, right upper limb: Secondary | ICD-10-CM

## 2020-09-15 ENCOUNTER — Ambulatory Visit
Admission: RE | Admit: 2020-09-15 | Discharge: 2020-09-15 | Disposition: A | Discharge status: Home / Self Care | Payer: Medicare PPO | Source: Ambulatory Visit | Attending: Orthopaedic Surgery | Admitting: Orthopaedic Surgery

## 2020-09-15 ENCOUNTER — Other Ambulatory Visit: Payer: Self-pay

## 2020-09-15 DIAGNOSIS — R2231 Localized swelling, mass and lump, right upper limb: Secondary | ICD-10-CM

## 2020-09-15 MED ORDER — IOHEXOL 350 MG/ML SOLN
100.0000 mL | Freq: Once | INTRAVENOUS | Status: AC | PRN
  Administered 2020-09-15: 100 mL via INTRAVENOUS

## 2020-10-09 ENCOUNTER — Ambulatory Visit (INDEPENDENT_AMBULATORY_CARE_PROVIDER_SITE_OTHER): Payer: Medicare PPO

## 2020-10-09 VITALS — Ht 73.0 in | Wt 203.0 lb

## 2020-10-09 DIAGNOSIS — Z0000 Encounter for general adult medical examination without abnormal findings: Secondary | ICD-10-CM

## 2020-10-09 NOTE — Progress Notes (Signed)
I connected with Shawn Fare Sr. today by telephone and verified that I am speaking with the correct person using two identifiers. Location patient: home Location provider: work Persons participating in the virtual visit: Shawn Fare Sr., Glenna Durand LPN.   I discussed the limitations, risks, security and privacy concerns of performing an evaluation and management service by telephone and the availability of in person appointments. I also discussed with the patient that there may be a patient responsible charge related to this service. The patient expressed understanding and verbally consented to this telephonic visit.    Interactive audio and video telecommunications were attempted between this provider and patient, however failed, due to patient having technical difficulties OR patient did not have access to video capability.  We continued and completed visit with audio only.     Vital signs may be patient reported or missing.  Subjective:   Shawn Kitchens Sr. is a 74 y.o. male who presents for Medicare Annual/Subsequent preventive examination.  Review of Systems     Cardiac Risk Factors include: advanced age (>35mn, >>64women);hypertension;male gender     Objective:    Today's Vitals   10/09/20 1026  Weight: 203 lb (92.1 kg)  Height: '6\' 1"'  (1.854 m)   Body mass index is 26.78 kg/m.  Advanced Directives 10/09/2020 08/03/2020 03/27/2020 02/01/2020 10/26/2019 10/07/2019 04/13/2019  Does Patient Have a Medical Advance Directive? Yes Yes Yes Yes Yes Yes Yes  Type of AParamed[place]of [place] will [place] will Healthcare Power of Attorney Living will;Healthcare Power of [place]Living will Living will;Healthcare Power of AUniversity of California-DavisLiving will  Does patient want to make changes to medical advance directive? - - No - Patient declined - - - -  Copy of [place] Chart? No - copy requested Yes - validated most recent copy scanned in chart (See row information) No - copy requested - - Yes - validated most recent copy scanned in chart (See row information) -    Current Medications (verified) Outpatient Encounter Medications as of 10/09/2020  Medication Sig  . acetaminophen (TYLENOL) 500 MG tablet Take 1,000 mg by mouth every 6 (six) hours as needed for moderate pain or headache.  .Marland Kitchenaspirin EC 81 MG tablet Take 81 mg by mouth daily.  .Marland Kitchenatorvastatin (LIPITOR) 80 MG tablet Take 1 tablet (80 mg total) by mouth at bedtime.  . fexofenadine (ALLEGRA) 180 MG tablet Take 1 tablet (180 mg total) by mouth daily.  . flecainide (TAMBOCOR) 100 MG tablet Take 2 tablets (200 mg total) as needed by mouth. As needed for recurrent tachycardia.  Do not repeat more than once per day or 3x per wk  . metoprolol succinate (TOPROL-XL) 25 MG 24 hr tablet Take 1 tablet (25 mg total) by mouth daily.  . niacin (NIASPAN) 1000 MG CR tablet Take 1 tablet (1,000 mg total) by mouth at bedtime.  . nitroGLYCERIN (NITROSTAT) 0.4 MG SL tablet nitroglycerin 0.4 mg subl  . pantoprazole (PROTONIX) 40 MG tablet Take 1 tablet (40 mg total) by mouth daily.  . ramipril (ALTACE) 10 MG capsule Take 10 mg by mouth daily.  . vitamin B-12 (CYANOCOBALAMIN) 1000 MCG tablet Take 1 tablet (1,000 mcg total) by mouth daily.  .Marland Kitchenazelastine (ASTELIN) 0.1 % nasal spray Place 1 spray into both nostrils 2 (two) times daily. Use in each nostril as directed (Patient not taking: Reported on 08/03/2020)  . montelukast (SINGULAIR) 10  MG tablet Take 1 tablet (10 mg total) by mouth at bedtime. (Patient not taking: Reported on 08/03/2020)   No facility-administered encounter medications on file as of 10/09/2020.    Allergies (verified) Patient has no known allergies.   History: Past Medical History:  Diagnosis Date  . Arthritis   . Basal cell carcinoma 1999   basal cell/skin  . CAD (coronary artery disease)    . Cataract Cataract surgery 2017  . GERD (gastroesophageal reflux disease)   . History of bone marrow biopsy   . History of kidney stones 2013   passed stone on his own  . Hyperlipidemia   . Hypertension   . Leukopenia   . Shingles   . Ventricular tachycardia [facility])    Past Surgical History:  Procedure Laterality Date  . CARDIAC CATHETERIZATION N/A 01/16/2016   Procedure: Left Heart Cath and Coronary Angiography;  Surgeon: [name], MD;  Location: [place] [facility];  Service: Cardiovascular;  Laterality: N/A;  . CATARACT EXTRACTION Left 2017  . COLONOSCOPY WITH PROPOFOL N/A 03/27/2020   Procedure: COLONOSCOPY WITH PROPOFOL;  Surgeon: [name], MD;  Location: [place];  Service: Endoscopy;  Laterality: N/A;  priority 4  . CORONARY ANGIOPLASTY  2000 and 2003  . EYE SURGERY  2017   Cateract  . GANGLION CYST EXCISION  1979  . KNEE ARTHROSCOPY WITH MEDIAL MENISECTOMY Left 12/01/2017   Procedure: KNEE ARTHROSCOPY WITH MEDIAL MENISECTOMY;  Surgeon: [name], MD;  Location: [facility];  Service: Orthopedics;  Laterality: Left;  Partial  . LEFT HEART CATH Right 08/07/2017   Procedure: Left Heart Cath;  Surgeon: [name], MD;  Location: [place] [facility];  Service: Cardiovascular;  Laterality: Right;  . PILONIDAL CYST EXCISION  1977  . POLYPECTOMY  03/27/2020   Procedure: POLYPECTOMY;  Surgeon: [name], MD;  Location: [place];  Service: Endoscopy;;  . SKIN CANCER EXCISION  1999   ear  . SYNOVECTOMY Left 12/01/2017   Procedure: SYNOVECTOMY;  Surgeon: [name], MD;  Location: [facility];  Service: Orthopedics;  Laterality: Left;  Partial  . V TACH ABLATION N/A 08/14/2017   Procedure: V Tach Ablation;  Surgeon: [name], MD;  Location: [place] [facility];  Service: Cardiovascular;  Laterality: N/A;  . VEIN SURGERY  2000   laser   Family History  Problem Relation Age of Onset  . Hypertension Mother   . Heart disease Mother    . Heart attack Mother   . Diabetes Mother   . Alzheimer's disease Father   . Congestive Heart Failure Brother   . Hypertension Brother   . Other Brother        dementia  . Heart disease Brother   . Cancer Maternal Grandfather   . Brain cancer Daughter 13  . Cancer Daughter   . Cancer Maternal Uncle   . Heart disease Son    Social History   Socioeconomic History  . Marital status: Married    Spouse name: Not on file  . Number of children: 2  . Years of education: Not on file  . Highest education level: Master's degree (e.g., MA, MS, MEng, MEd, MSW, MBA)  Occupational History  . Occupation: retired  Tobacco Use  . Smoking status: Former Smoker    Packs/day: 1.00    Years: 5.00    Pack years: 5.00    Types: Cigarettes    Quit date: 06/21/1971    Years since quitting: 49.3  .  Smokeless tobacco: Former Systems developer    Types: Chew    Quit date: 06/20/1980  Vaping Use  . Vaping Use: Never used  Substance and Sexual Activity  . Alcohol use: Not Currently    Alcohol/week: 0.0 standard drinks  . Drug use: No  . Sexual activity: Not Currently  Other Topics Concern  . Not on file  Social History Narrative   Lives in [place]   Retired school principal   Social Determinants of Health   Financial Resource Strain: [place]   . Difficulty of Paying Living Expenses: Not hard at all  Food Insecurity: No Food Insecurity  . Worried About Charity fundraiser in the Last Year: Never true  . Ran Out of Food in the Last Year: Never true  Transportation Needs: No Transportation Needs  . Lack of Transportation (Medical): No  . Lack of Transportation (Non-Medical): No  Physical Activity: Insufficiently Active  . Days of Exercise per Week: 3 days  . Minutes of Exercise per Session: 40 min  Stress: No Stress Concern Present  . Feeling of Stress : Not at all  Social Connections:   . Frequency of Communication with Friends and Family: Not on file  . Frequency of Social Gatherings with  Friends and Family: Not on file  . Attends Religious Services: Not on file  . Active Member of Clubs or Organizations: Not on file  . Attends Archivist Meetings: Not on file  . Marital Status: Not on file    Tobacco Counseling Counseling given: Not Answered   Clinical Intake:  Pre-visit preparation completed: Yes  Pain : No/denies pain     Nutritional Status: BMI 25 -29 Overweight Nutritional Risks: None Diabetes: No  How often do you need to have someone help you when you read instructions, pamphlets, or other written materials from your doctor or pharmacy?: 1 - Never What is the last grade level you completed in school?: 2 master's degree  Diabetic? no  Interpreter Needed?: No  Information entered by :: NAllen LPN   Activities of Daily Living In your present state of health, do you have any difficulty performing the following activities: 10/09/2020 03/27/2020  Hearing? N N  Vision? N N  Difficulty concentrating or making decisions? N N  Walking or climbing stairs? N N  Dressing or bathing? N N  Doing errands, shopping? N -  Preparing Food and eating ? N -  Using the Toilet? N -  In the past six months, have you accidently leaked urine? N -  Do you have problems with loss of bowel control? N -  Managing your Medications? N -  Managing your Finances? N -  Housekeeping or managing your Housekeeping? N -  Some recent data might be hidden    Patient Care Team: [name], PA-C as PCP - General (Family Medicine) [name], MD as Consulting Physician (Cardiology) [name], MD as Consulting Physician (Oncology)  Indicate any recent [place] you may have received from other than [facility] providers in the past year (date may be approximate).     Assessment:   This is a routine wellness examination for Shawn Shepard.  Hearing/Vision screen  Hearing Screening   '125Hz'  '250Hz'  '500Hz'  '1000Hz'  '2000Hz'  '3000Hz'  '4000Hz'  '6000Hz'  '8000Hz'   Right ear:            Left ear:           Vision Screening Comments: Regular eye exams, Dr. Jomarie Longs  Dietary issues and exercise  activities discussed: Current Exercise Habits: Home exercise routine, Type of exercise: walking (plays golf 2 times a week), Time (Minutes): 40, Frequency (Times/Week): 3, Weekly Exercise (Minutes/Week): 120  Goals    . DIET - INCREASE WATER INTAKE     Recommend drinking 6-8 glasses of water per day    . Patient Stated     10/09/2020, wants to lose 10 pounds      Depression Screen PHQ 2/9 Scores 10/09/2020 10/07/2019 10/01/2018 05/04/2018 10/17/2017 09/11/2017 06/23/2017  PHQ - 2 Score 0 0 0 0 0 0 0  PHQ- 9 Score 0 - 2 - 0 - -    Fall Risk Fall Risk  10/09/2020 10/07/2019 11/09/2018 10/01/2018 05/04/2018  Falls in the past year? 0 0 0 No No  Number falls in past yr: - 0 - - -  Injury with Fall? - 0 - - -  Risk for fall due to : Medication side effect - - - -  Follow up Falls evaluation completed;Education provided;Falls prevention discussed - Falls evaluation completed - -    Any stairs in or around the home? No  If so, are there any without handrails? n/a Home free of loose throw rugs in walkways, pet beds, electrical cords, etc? Yes  Adequate lighting in your home to reduce risk of falls? Yes   ASSISTIVE DEVICES UTILIZED TO PREVENT FALLS:  Life alert? No  Use of a cane, walker or w/c? No  Grab bars in the bathroom? Yes  Shower chair or bench in shower? No  Elevated toilet seat or a handicapped toilet? No   TIMED UP AND GO:  Was the test performed? No .    Cognitive Function:     6CIT Screen 10/09/2020 10/01/2018 09/11/2017  What Year? 0 points 0 points 0 points  What month? 0 points 0 points 0 points  What time? 0 points 0 points 0 points  Count back from 20 0 points 0 points 0 points  Months in reverse 0 points 0 points 0 points  Repeat phrase 0 points 0 points 0 points  Total Score 0 0 0    Immunizations Immunization History  Administered Date(s)  Administered  . Influenza, High Dose Seasonal PF 08/26/2017, 08/05/2019  . Influenza-Unspecified 09/27/2015, 08/21/2016, 08/26/2017, 09/04/2018  . Moderna SARS-COVID-2 Vaccination 01/07/2020, 02/05/2020  . Pneumococcal Conjugate-13 12/08/2014  . Pneumococcal Polysaccharide-23 09/10/2016  . Td 12/21/2007, 12/16/2019  . Zoster 12/22/2008    TDAP status: Up to date Flu Vaccine status: Up to date Pneumococcal vaccine status: Up to date Covid-19 vaccine status: Completed vaccines  Qualifies for Shingles Vaccine? Yes   Zostavax completed Yes   Shingrix Completed?: No.    Education has been provided regarding the importance of this vaccine. Patient has been advised to call insurance company to determine out of pocket expense if they have not yet received this vaccine. Advised may also receive vaccine at local pharmacy or Health Dept. Verbalized acceptance and understanding.  Screening Tests Health Maintenance  Topic Date Due  . COLON CANCER SCREENING ANNUAL FOBT  06/01/2020  . INFLUENZA VACCINE  08/03/2024 (Originally 07/02/2020)  . TETANUS/TDAP  12/15/2029  . COLONOSCOPY  03/27/2030  . COVID-19 Vaccine  Completed  . Hepatitis C Screening  Completed  . PNA vac Low Risk Adult  Completed    Health Maintenance  Health Maintenance Due  Topic Date Due  . COLON CANCER SCREENING ANNUAL FOBT  06/01/2020    Colorectal cancer screening: Completed 03/27/2020. Repeat every 10 years  Lung Cancer  Screening: (Low Dose CT Chest recommended if Age 72-80 years, 30 pack-year currently smoking OR have quit w/in 15years.) does not qualify.   Lung Cancer Screening Referral: no   Additional Screening:  Hepatitis C Screening: does qualify; Completed 12/22/2018  Vision Screening: Recommended annual ophthalmology exams for early detection of glaucoma and other disorders of the eye. Is the patient up to date with their annual eye exam?  Yes  Who is the provider or what is the name of the office in which  the patient attends annual eye exams? Dr. Matilde Sprang  If pt is not established with a provider, would they like to be referred to a provider to establish care? No .   Dental Screening: Recommended annual dental exams for proper oral hygiene  Community Resource Referral / Chronic Care Management: CRR required this visit?  No   CCM required this visit?  No      Plan:     I have personally reviewed and noted the following in the patient's chart:   . Medical and social history . Use of alcohol, tobacco or illicit drugs  . Current medications and supplements . Functional ability and status . Nutritional status . Physical activity . Advanced directives . List of other physicians . Hospitalizations, surgeries, and ER visits in previous 12 months . Vitals . Screenings to include cognitive, depression, and falls . Referrals and appointments  In addition, I have reviewed and discussed with patient certain preventive protocols, quality metrics, and best practice recommendations. A written personalized care plan for preventive services as well as general preventive health recommendations were provided to patient.     Kellie Simmering, LPN   00/04/1101   Nurse Notes:

## 2020-10-09 NOTE — Patient Instructions (Signed)
[name] , Thank you for taking time to come for your Medicare Wellness Visit. I appreciate your ongoing commitment to your health goals. Please review the following plan we discussed and let me know if I can assist you in the future.   Screening recommendations/referrals: Colonoscopy: no longer required Recommended yearly ophthalmology/optometry visit for glaucoma screening and checkup Recommended yearly dental visit for hygiene and checkup  Vaccinations: Influenza vaccine: completed 09/2020 per patient, due 07/02/2021 Pneumococcal vaccine: completed 09/10/2016 Tdap vaccine: completed 12/16/2019, due 12/15/2029 Shingles vaccine: discussed   Covid-19:  02/05/2020, 01/07/2020  Advanced directives: Please bring a copy of your POA (Power of Attorney) and/or Living Will to your next appointment.   Conditions/risks identified: none  Next appointment: Follow up in one year for your annual wellness visit.   Preventive Care 73 Years and Older, Male Preventive care refers to lifestyle choices and visits with your health care provider that can promote health and wellness. What does preventive care include?  A yearly physical exam. This is also called an annual well check.  Dental exams once or twice a year.  Routine eye exams. Ask your health care provider how often you should have your eyes checked.  Personal lifestyle choices, including:  Daily care of your teeth and gums.  Regular physical activity.  Eating a healthy diet.  Avoiding tobacco and drug use.  Limiting alcohol use.  Practicing safe sex.  Taking low doses of aspirin every day.  Taking vitamin and mineral supplements as recommended by your health care provider. What happens during an annual well check? The services and screenings done by your health care provider during your annual well check will depend on your age, overall health, lifestyle risk factors, and family history of disease. Counseling  Your health care  provider may ask you questions about your:  Alcohol use.  Tobacco use.  Drug use.  Emotional well-being.  Home and relationship well-being.  Sexual activity.  Eating habits.  History of falls.  Memory and ability to understand (cognition).  Work and work Statistician. Screening  You may have the following tests or measurements:  Height, weight, and BMI.  Blood pressure.  Lipid and cholesterol levels. These may be checked every 5 years, or more frequently if you are over 69 years old.  Skin check.  Lung cancer screening. You may have this screening every year starting at age 73 if you have a 30-pack-year history of smoking and currently smoke or have quit within the past 15 years.  Fecal occult blood test (FOBT) of the stool. You may have this test every year starting at age 18.  Flexible sigmoidoscopy or colonoscopy. You may have a sigmoidoscopy every 5 years or a colonoscopy every 10 years starting at age 73.  Prostate cancer screening. Recommendations will vary depending on your family history and other risks.  Hepatitis C blood test.  Hepatitis B blood test.  Sexually transmitted disease (STD) testing.  Diabetes screening. This is done by checking your blood sugar (glucose) after you have not eaten for a while (fasting). You may have this done every 1-3 years.  Abdominal aortic aneurysm (AAA) screening. You may need this if you are a current or former smoker.  Osteoporosis. You may be screened starting at age 37 if you are at high risk. Talk with your health care provider about your test results, treatment options, and if necessary, the need for more tests. Vaccines  Your health care provider may recommend certain vaccines, such as:  Influenza  vaccine. This is recommended every year.  Tetanus, diphtheria, and acellular pertussis (Tdap, Td) vaccine. You may need a Td booster every 10 years.  Zoster vaccine. You may need this after age 73.  Pneumococcal  13-valent conjugate (PCV13) vaccine. One dose is recommended after age 73.  Pneumococcal polysaccharide (PPSV23) vaccine. One dose is recommended after age 73. Talk to your health care provider about which screenings and vaccines you need and how often you need them. This information is not intended to replace advice given to you by your health care provider. Make sure you discuss any questions you have with your health care provider. Document Released: 12/15/2015 Document Revised: 08/07/2016 Document Reviewed: 09/19/2015 Elsevier Interactive Patient Education  2017 [place] Prevention in the Home Falls can cause injuries. They can happen to people of all ages. There are many things you can do to make your home safe and to help prevent falls. What can I do on the outside of my home?  Regularly fix the edges of walkways and driveways and fix any cracks.  Remove anything that might make you trip as you walk through a door, such as a raised step or threshold.  Trim any bushes or trees on the path to your home.  Use bright outdoor lighting.  Clear any walking paths of anything that might make someone trip, such as rocks or tools.  Regularly check to see if handrails are loose or broken. Make sure that both sides of any steps have handrails.  Any raised decks and porches should have guardrails on the edges.  Have any leaves, snow, or ice cleared regularly.  Use sand or salt on walking paths during winter.  Clean up any spills in your garage right away. This includes oil or grease spills. What can I do in the bathroom?  Use night lights.  Install grab bars by the toilet and in the tub and shower. Do not use towel bars as grab bars.  Use non-skid mats or decals in the tub or shower.  If you need to sit down in the shower, use a plastic, non-slip stool.  Keep the floor dry. Clean up any water that spills on the floor as soon as it happens.  Remove soap buildup in the  tub or shower regularly.  Attach bath mats securely with double-sided non-slip rug tape.  Do not have throw rugs and other things on the floor that can make you trip. What can I do in the bedroom?  Use night lights.  Make sure that you have a light by your bed that is easy to reach.  Do not use any sheets or blankets that are too big for your bed. They should not hang down onto the floor.  Have a firm chair that has side arms. You can use this for support while you get dressed.  Do not have throw rugs and other things on the floor that can make you trip. What can I do in the kitchen?  Clean up any spills right away.  Avoid walking on wet floors.  Keep items that you use a lot in easy-to-reach places.  If you need to reach something above you, use a strong step stool that has a grab bar.  Keep electrical cords out of the way.  Do not use floor polish or wax that makes floors slippery. If you must use wax, use non-skid floor wax.  Do not have throw rugs and other things on the floor that can  make you trip. What can I do with my stairs?  Do not leave any items on the stairs.  Make sure that there are handrails on both sides of the stairs and use them. Fix handrails that are broken or loose. Make sure that handrails are as long as the stairways.  Check any carpeting to make sure that it is firmly attached to the stairs. Fix any carpet that is loose or worn.  Avoid having throw rugs at the top or bottom of the stairs. If you do have throw rugs, attach them to the floor with carpet tape.  Make sure that you have a light switch at the top of the stairs and the bottom of the stairs. If you do not have them, ask someone to add them for you. What else can I do to help prevent falls?  Wear shoes that:  Do not have high heels.  Have rubber bottoms.  Are comfortable and fit you well.  Are closed at the toe. Do not wear sandals.  If you use a stepladder:  Make sure that it  is fully opened. Do not climb a closed stepladder.  Make sure that both sides of the stepladder are locked into place.  Ask someone to hold it for you, if possible.  Clearly mark and make sure that you can see:  Any grab bars or handrails.  First and last steps.  Where the edge of each step is.  Use tools that help you move around (mobility aids) if they are needed. These include:  Canes.  Walkers.  Scooters.  Crutches.  Turn on the lights when you go into a dark area. Replace any light bulbs as soon as they burn out.  Set up your furniture so you have a clear path. Avoid moving your furniture around.  If any of your floors are uneven, fix them.  If there are any pets around you, be aware of where they are.  Review your medicines with your doctor. Some medicines can make you feel dizzy. This can increase your chance of falling. Ask your doctor what other things that you can do to help prevent falls. This information is not intended to replace advice given to you by your health care provider. Make sure you discuss any questions you have with your health care provider. Document Released: 09/14/2009 Document Revised: 04/25/2016 Document Reviewed: 12/23/2014 Elsevier Interactive Patient Education  2017 [employer].

## 2020-10-27 ENCOUNTER — Other Ambulatory Visit: Payer: Self-pay | Admitting: Family Medicine

## 2020-11-03 ENCOUNTER — Encounter: Payer: Self-pay | Admitting: Nurse Practitioner

## 2020-11-06 ENCOUNTER — Telehealth: Payer: Self-pay

## 2020-11-06 NOTE — Telephone Encounter (Signed)
Copied from [place] [phone]. Topic: General - Other >> Nov 06, 2020 10:25 AM Alanda Slim E wrote: Reason for CRM: Pts wife Ardine Eng is [name]'s Pt and he would like to request [name] as his provider as well/ please advise if [name] will accpet

## 2020-11-06 NOTE — Telephone Encounter (Signed)
I am okay with seeing him as patient.

## 2020-11-14 ENCOUNTER — Other Ambulatory Visit: Payer: Self-pay | Admitting: Family Medicine

## 2020-11-23 ENCOUNTER — Other Ambulatory Visit: Payer: Self-pay | Admitting: Family Medicine

## 2020-11-23 DIAGNOSIS — I2510 Atherosclerotic heart disease of native coronary artery without angina pectoris: Secondary | ICD-10-CM

## 2020-11-23 DIAGNOSIS — E7800 Pure hypercholesterolemia, unspecified: Secondary | ICD-10-CM

## 2020-11-25 ENCOUNTER — Encounter: Payer: Self-pay | Admitting: Nurse Practitioner

## 2020-11-25 DIAGNOSIS — D72819 Decreased white blood cell count, unspecified: Secondary | ICD-10-CM | POA: Insufficient documentation

## 2020-11-25 DIAGNOSIS — D696 Thrombocytopenia, unspecified: Secondary | ICD-10-CM | POA: Insufficient documentation

## 2020-11-27 ENCOUNTER — Encounter: Payer: Medicare PPO | Admitting: Nurse Practitioner

## 2020-11-27 ENCOUNTER — Encounter: Payer: Medicare PPO | Admitting: Family Medicine

## 2020-11-28 ENCOUNTER — Other Ambulatory Visit: Payer: Self-pay

## 2020-11-28 ENCOUNTER — Ambulatory Visit (INDEPENDENT_AMBULATORY_CARE_PROVIDER_SITE_OTHER): Payer: Medicare PPO | Admitting: Nurse Practitioner

## 2020-11-28 ENCOUNTER — Encounter: Payer: Self-pay | Admitting: Nurse Practitioner

## 2020-11-28 VITALS — BP 127/80 | HR 58 | Temp 97.7°F | Ht 71.46 in | Wt 204.2 lb

## 2020-11-28 DIAGNOSIS — D696 Thrombocytopenia, unspecified: Secondary | ICD-10-CM | POA: Diagnosis not present

## 2020-11-28 DIAGNOSIS — G8929 Other chronic pain: Secondary | ICD-10-CM

## 2020-11-28 DIAGNOSIS — Z125 Encounter for screening for malignant neoplasm of prostate: Secondary | ICD-10-CM | POA: Diagnosis not present

## 2020-11-28 DIAGNOSIS — I472 Ventricular tachycardia, unspecified: Secondary | ICD-10-CM

## 2020-11-28 DIAGNOSIS — M810 Age-related osteoporosis without current pathological fracture: Secondary | ICD-10-CM | POA: Insufficient documentation

## 2020-11-28 DIAGNOSIS — M8588 Other specified disorders of bone density and structure, other site: Secondary | ICD-10-CM

## 2020-11-28 DIAGNOSIS — M8580 Other specified disorders of bone density and structure, unspecified site: Secondary | ICD-10-CM | POA: Insufficient documentation

## 2020-11-28 DIAGNOSIS — M25552 Pain in left hip: Secondary | ICD-10-CM

## 2020-11-28 DIAGNOSIS — I10 Essential (primary) hypertension: Secondary | ICD-10-CM

## 2020-11-28 DIAGNOSIS — I2583 Coronary atherosclerosis due to lipid rich plaque: Secondary | ICD-10-CM

## 2020-11-28 DIAGNOSIS — Z0000 Encounter for general adult medical examination without abnormal findings: Secondary | ICD-10-CM | POA: Diagnosis not present

## 2020-11-28 DIAGNOSIS — I2510 Atherosclerotic heart disease of native coronary artery without angina pectoris: Secondary | ICD-10-CM

## 2020-11-28 DIAGNOSIS — D709 Neutropenia, unspecified: Secondary | ICD-10-CM

## 2020-11-28 DIAGNOSIS — K2100 Gastro-esophageal reflux disease with esophagitis, without bleeding: Secondary | ICD-10-CM

## 2020-11-28 DIAGNOSIS — E7800 Pure hypercholesterolemia, unspecified: Secondary | ICD-10-CM

## 2020-11-28 DIAGNOSIS — N400 Enlarged prostate without lower urinary tract symptoms: Secondary | ICD-10-CM

## 2020-11-28 MED ORDER — NIACIN ER (ANTIHYPERLIPIDEMIC) 1000 MG PO TBCR: EXTENDED_RELEASE_TABLET | ORAL | 4 refills | Status: DC

## 2020-11-28 MED ORDER — ATORVASTATIN CALCIUM 80 MG PO TABS: 80.0000 mg | ORAL_TABLET | Freq: Every day | ORAL | 4 refills | Status: DC

## 2020-11-28 MED ORDER — METOPROLOL SUCCINATE ER 25 MG PO TB24: 25.0000 mg | ORAL_TABLET | Freq: Every day | ORAL | 4 refills | Status: DC

## 2020-11-28 NOTE — Assessment & Plan Note (Signed)
Chronic, ongoing.  Recommend starting Vitamin D supplement and calcium daily.  Educated on this.  Obtain Vit D level today and plan on repeat DEXA in 2023.

## 2020-11-28 NOTE — Assessment & Plan Note (Signed)
Chronic, stable.  Continue current medication regimen and collaboration with cardiology.  No recent NTG use.

## 2020-11-28 NOTE — Assessment & Plan Note (Signed)
Chronic, stable with BP at goal in office today.  Recommend she monitor BP at least a few mornings a week at home and document.  DASH diet at home.  Continue current medication regimen and adjust as needed.  Labs today.  Return in 6 months.

## 2020-11-28 NOTE — Assessment & Plan Note (Signed)
Continue collaboration with cardiology, no recent use Tambocor.

## 2020-11-28 NOTE — Assessment & Plan Note (Signed)
Check PSA today. 

## 2020-11-28 NOTE — Assessment & Plan Note (Signed)
Ongoing for over one year with worsening.  Will obtain imaging, has underlying osteopenia.  Recommend starting Vitamin D3 daily.  Recommend continuing over the counter Tylenol and creams for discomfort.  Alternate heat and ice as needed.  Plan of referral to PT or ortho dependent on imaging findings.

## 2020-11-28 NOTE — Progress Notes (Signed)
BP 127/80   Pulse (!) 58   Temp 97.7 F (36.5 C) (Oral)   Ht 5' 11.46" (1.815 m)   Wt 204 lb 3.2 oz (92.6 kg)   SpO2 98%   BMI 28.12 kg/m    Subjective:    Patient ID: Shawn Kitchens Sr., male    DOB: 1946/05/21, 74 y.o.   MRN: [id number]  HPI: Shawn TURNEY Sr. is a 74 y.o. male presenting on 11/28/2020 for comprehensive medical examination. Current medical complaints include:none  He currently lives with: wife Interim Problems from his last visit: no   HYPERTENSION / HYPERLIPIDEMIA Continues on Ramipril, Metoprolol, Tambocor as needed, Niacin, and Atorvastatin.  Sees cardiology every 6 months -- sees Dr. Chancy Milroy.  Has two blockages they monitor.  Had ablation 3 years ago for VT.   Satisfied with current treatment? yes Duration of hypertension: chronic BP monitoring frequency: not checking BP range:  BP medication side effects: no Duration of hyperlipidemia: chronic Cholesterol medication side effects: no Cholesterol supplements: none Medication compliance: good compliance Aspirin: yes Recent stressors: no Recurrent headaches: no Visual changes: no Palpitations: no Dyspnea: no Chest pain: no Lower extremity edema: to right leg at end of day only -- baseline Dizzy/lightheaded: no   LEUKOPENIA AND THROMBOCYTOPENIA: Noted on labs since 2018, followed by oncology and last saw 08/03/20.  Returns in March.  No fevers, night sweats, unexpected weight loss, decrease appetite.Marland Kitchen    GERD Continues on Protonix as needed. GERD control status: stable  Satisfied with current treatment? yes Heartburn frequency: none Medication side effects: no  Medication compliance: stable Dysphagia: no Odynophagia:  no Hematemesis: no Blood in stool: no EGD: no   HIP PAIN Has had worsening over past year.  Stiffness when gets out of bed in morning to lower back and left hip.  Has been going to chiropractor once a month for 30 years.  The more he moves the better pain is, sitting for  long time.  Has history of DEXA in 2018 -- noted osteopenia AP spine. Duration: chronic Involved hip: left  Mechanism of injury: unknown Location: lateral Onset: gradual  Severity: 4/10  Quality: dull and aching Frequency: intermittent Radiation: no Aggravating factors: prolonged sitting   Alleviating factors: APAP  Status: worse Treatments attempted: APAP and chiropractor   Relief with NSAIDs?: No NSAIDs Taken Weakness with weight bearing: no Weakness with walking: no Paresthesias / decreased sensation: no Swelling: no Redness:no Fevers: no  Functional Status Survey: Is the patient deaf or have difficulty hearing?: No Does the patient have difficulty seeing, even when wearing glasses/contacts?: Yes Does the patient have difficulty concentrating, remembering, or making decisions?: No Does the patient have difficulty walking or climbing stairs?: No Does the patient have difficulty dressing or bathing?: No Does the patient have difficulty doing errands alone such as visiting a doctor's office or shopping?: No  FALL RISK: Fall Risk  11/28/2020 10/09/2020 10/07/2019 11/09/2018 10/01/2018  Falls in the past year? 0 0 0 0 No  Number falls in past yr: - - 0 - -  Injury with Fall? 0 - 0 - -  Risk for fall due to : No Fall Risks Medication side effect - - -  Follow up - Falls evaluation completed;Education provided;Falls prevention discussed - Falls evaluation completed -    Depression Screen Depression screen [facility] 2/9 11/28/2020 10/09/2020 10/07/2019 10/01/2018 05/04/2018  Decreased Interest 0 0 0 0 0  Down, Depressed, Hopeless 0 0 0 0 0  PHQ - 2 Score  0 0 0 0 0  Altered sleeping - 0 - 1 -  Tired, decreased energy - 0 - 1 -  Change in appetite - 0 - 0 -  Feeling bad or failure about yourself  - 0 - 0 -  Trouble concentrating - 0 - 0 -  Moving slowly or fidgety/restless - 0 - 0 -  Suicidal thoughts - 0 - 0 -  PHQ-9 Score - 0 - 2 -    Advanced Directives <no information>  Past  Medical History:  Past Medical History:  Diagnosis Date  . Arthritis   . Basal cell carcinoma 1999   basal cell/skin  . CAD (coronary artery disease)   . Cataract Cataract surgery 2017  . GERD (gastroesophageal reflux disease)   . History of bone marrow biopsy   . History of kidney stones 2013   passed stone on his own  . Hyperlipidemia   . Hypertension   . Leukopenia   . Shingles   . Ventricular tachycardia [facility])     Surgical History:  Past Surgical History:  Procedure Laterality Date  . CARDIAC CATHETERIZATION N/A 01/16/2016   Procedure: Left Heart Cath and Coronary Angiography;  Surgeon: [name], MD;  Location: [place] [facility];  Service: Cardiovascular;  Laterality: N/A;  . CATARACT EXTRACTION Left 2017  . COLONOSCOPY WITH PROPOFOL N/A 03/27/2020   Procedure: COLONOSCOPY WITH PROPOFOL;  Surgeon: [name], MD;  Location: [place];  Service: Endoscopy;  Laterality: N/A;  priority 4  . CORONARY ANGIOPLASTY  2000 and 2003  . EYE SURGERY  2017   Cateract  . GANGLION CYST EXCISION  1979  . KNEE ARTHROSCOPY WITH MEDIAL MENISECTOMY Left 12/01/2017   Procedure: KNEE ARTHROSCOPY WITH MEDIAL MENISECTOMY;  Surgeon: [name], MD;  Location: [facility];  Service: Orthopedics;  Laterality: Left;  Partial  . LEFT HEART CATH Right 08/07/2017   Procedure: Left Heart Cath;  Surgeon: [name], MD;  Location: [place] [facility];  Service: Cardiovascular;  Laterality: Right;  . PILONIDAL CYST EXCISION  1977  . POLYPECTOMY  03/27/2020   Procedure: POLYPECTOMY;  Surgeon: [name], MD;  Location: [place];  Service: Endoscopy;;  . SKIN CANCER EXCISION  1999   ear  . SYNOVECTOMY Left 12/01/2017   Procedure: SYNOVECTOMY;  Surgeon: [name], MD;  Location: [facility];  Service: Orthopedics;  Laterality: Left;  Partial  . V TACH ABLATION N/A 08/14/2017   Procedure: V Tach Ablation;  Surgeon: [name], MD;  Location: [place] [facility];  Service: Cardiovascular;  Laterality: N/A;  . VEIN SURGERY  2000   laser    Medications:  Current Outpatient Medications on File Prior to Visit  Medication Sig  . acetaminophen (TYLENOL) 500 MG tablet Take 1,000 mg by mouth every 6 (six) hours as needed for moderate pain or headache.  Marland Kitchen aspirin EC 81 MG tablet Take 81 mg by mouth daily.  . flecainide (TAMBOCOR) 100 MG tablet Take 2 tablets (200 mg total) as needed by mouth. As needed for recurrent tachycardia.  Do not repeat more than once per day or 3x per wk  . nitroGLYCERIN (NITROSTAT) 0.4 MG SL tablet nitroglycerin 0.4 mg subl  . pantoprazole (PROTONIX) 40 MG tablet TAKE 1 TABLET(40 MG) BY MOUTH DAILY  . ramipril (ALTACE) 10 MG capsule Take 10 mg by mouth daily.  . vitamin B-12 (CYANOCOBALAMIN) 1000 MCG tablet Take 1 tablet (1,000 mcg total) by mouth daily.  No current facility-administered medications on file prior to visit.    Allergies:  No Known Allergies  Social History:  Social History   Socioeconomic History  . Marital status: Married    Spouse name: Not on file  . Number of children: 2  . Years of education: Not on file  . Highest education level: Master's degree (e.g., MA, MS, MEng, MEd, MSW, MBA)  Occupational History  . Occupation: retired  Tobacco Use  . Smoking status: Former Smoker    Packs/day: 1.00    Years: 5.00    Pack years: 5.00    Types: Cigarettes    Quit date: 06/21/1971    Years since quitting: 49.4  . Smokeless tobacco: Former Systems developer    Types: Chew    Quit date: 06/20/1980  Vaping Use  . Vaping Use: Never used  Substance and Sexual Activity  . Alcohol use: Not Currently    Alcohol/week: 0.0 standard drinks  . Drug use: No  . Sexual activity: Not Currently  Other Topics Concern  . Not on file  Social History Narrative   Lives in [place]   Retired school principal   Social Determinants of Health   Financial Resource Strain: [place]   . Difficulty of Paying Living Expenses:  Not hard at all  Food Insecurity: No Food Insecurity  . Worried About Charity fundraiser in the Last Year: Never true  . Ran Out of Food in the Last Year: Never true  Transportation Needs: No Transportation Needs  . Lack of Transportation (Medical): No  . Lack of Transportation (Non-Medical): No  Physical Activity: Insufficiently Active  . Days of Exercise per Week: 3 days  . Minutes of Exercise per Session: 40 min  Stress: No Stress Concern Present  . Feeling of Stress : Not at all  Social Connections: Not on file  Intimate Partner Violence: Not on file   Social History   Tobacco Use  Smoking Status Former Smoker  . Packs/day: 1.00  . Years: 5.00  . Pack years: 5.00  . Types: Cigarettes  . Quit date: 06/21/1971  . Years since quitting: 49.4  Smokeless Tobacco Former Systems developer  . Types: Chew  . Quit date: 06/20/1980   Social History   Substance and Sexual Activity  Alcohol Use Not Currently  . Alcohol/week: 0.0 standard drinks    Family History:  Family History  Problem Relation Age of Onset  . Hypertension Mother   . Heart disease Mother   . Heart attack Mother   . Diabetes Mother   . Alzheimer's disease Father   . Congestive Heart Failure Brother   . Hypertension Brother   . Other Brother        dementia  . Heart disease Brother   . Cancer Maternal Grandfather   . Brain cancer Daughter 108  . Cancer Daughter   . Cancer Maternal Uncle   . Heart disease Son     Past medical history, surgical history, medications, allergies, family history and social history reviewed with patient today and changes made to appropriate areas of the chart.   Review of Systems - negative All other ROS negative except what is listed above and in the HPI.      Objective:    BP 127/80   Pulse (!) 58   Temp 97.7 F (36.5 C) (Oral)   Ht 5' 11.46" (1.815 m)   Wt 204 lb 3.2 oz (92.6 kg)   SpO2 98%   BMI 28.12 kg/m  Wt Readings from Last 3 Encounters:  11/28/20 204 lb 3.2 oz  (92.6 kg)  10/09/20 203 lb (92.1 kg)  08/03/20 212 lb 6.4 oz (96.3 kg)    Physical Exam Vitals and nursing note reviewed.  Constitutional:      General: He is awake. He is not in acute distress.    Appearance: He is well-developed and well-groomed. He is not ill-appearing.  HENT:     Head: Normocephalic and atraumatic.     Right Ear: Hearing, tympanic membrane, ear canal and external ear normal. No drainage.     Left Ear: Hearing, tympanic membrane, ear canal and external ear normal. No drainage.     Nose: Nose normal.     Mouth/Throat:     Pharynx: Uvula midline.  Eyes:     General: Lids are normal.        Right eye: No discharge.        Left eye: No discharge.     Extraocular Movements: Extraocular movements intact.     Conjunctiva/sclera: Conjunctivae normal.     Pupils: Pupils are equal, round, and reactive to light.     Visual Fields: Right eye visual fields normal and left eye visual fields normal.  Neck:     Thyroid: No thyromegaly.     Vascular: No carotid bruit or JVD.     Trachea: Trachea normal.  Cardiovascular:     Rate and Rhythm: Normal rate and regular rhythm.     Heart sounds: Normal heart sounds, S1 normal and S2 normal. No murmur heard. No gallop.   Pulmonary:     Effort: Pulmonary effort is normal. No accessory muscle usage or respiratory distress.     Breath sounds: Normal breath sounds.  Abdominal:     General: Bowel sounds are normal.     Palpations: Abdomen is soft. There is no hepatomegaly or splenomegaly.     Tenderness: There is no abdominal tenderness.  Musculoskeletal:        General: Normal range of motion.     Cervical back: Normal range of motion and neck supple.     Lumbar back: No swelling, edema or tenderness. Normal range of motion. Negative right straight leg raise test and negative left straight leg raise test.     Right hip: Normal.     Left hip: No deformity, tenderness, bony tenderness or crepitus. Normal range of motion. Normal  strength.     Right lower leg: No edema.     Left lower leg: No edema.     Comments: Mild discomfort reported on external rotation of left hip.  Mild thoracic kyphosis noted on back assessment, R>L.  Lymphadenopathy:     Head:     Right side of head: No submental, submandibular, tonsillar, preauricular or posterior auricular adenopathy.     Left side of head: No submental, submandibular, tonsillar, preauricular or posterior auricular adenopathy.     Cervical: No cervical adenopathy.  Skin:    General: Skin is warm and dry.     Capillary Refill: Capillary refill takes less than 2 seconds.     Findings: No rash.  Neurological:     Mental Status: He is alert and oriented to person, place, and time.     Cranial Nerves: Cranial nerves are intact.     Gait: Gait is intact.     Deep Tendon Reflexes: Reflexes are normal and symmetric.     Reflex Scores:      Brachioradialis reflexes are 2+ on the  right side and 2+ on the left side.      Patellar reflexes are 2+ on the right side and 2+ on the left side. Psychiatric:        Attention and Perception: Attention normal.        Mood and Affect: Mood normal.        Speech: Speech normal.        Behavior: Behavior normal. Behavior is cooperative.        Thought Content: Thought content normal.        Cognition and Memory: Cognition normal.        Judgment: Judgment normal.    Results for orders placed or performed in visit on 08/03/20  Comprehensive metabolic panel  Result Value Ref Range   Sodium 138 135 - 145 mmol/L   Potassium 4.5 3.5 - 5.1 mmol/L   Chloride 104 98 - 111 mmol/L   CO2 27 22 - 32 mmol/L   Glucose, Bld 97 70 - 99 mg/dL   BUN 14 8 - 23 mg/dL   Creatinine, Ser 0.90 0.61 - 1.24 mg/dL   Calcium 9.6 8.9 - 10.3 mg/dL   Total Protein 6.4 (L) 6.5 - 8.1 g/dL   Albumin 3.8 3.5 - 5.0 g/dL   AST 23 15 - 41 U/L   ALT 22 0 - 44 U/L   Alkaline Phosphatase 59 38 - 126 U/L   Total Bilirubin 1.1 0.3 - 1.2 mg/dL   GFR calc non Af Amer  >60 >60 mL/min   GFR calc Af Amer >60 >60 mL/min   Anion gap 7 5 - 15  CBC with Differential/Platelet  Result Value Ref Range   WBC 3.1 (L) 4.0 - 10.5 K/uL   RBC 4.31 4.22 - 5.81 MIL/uL   Hemoglobin 13.5 13.0 - 17.0 g/dL   HCT 39.8 39.0 - 52.0 %   MCV 92.3 80.0 - 100.0 fL   MCH 31.3 26.0 - 34.0 pg   MCHC 33.9 30.0 - 36.0 g/dL   RDW 13.5 11.5 - 15.5 %   Platelets 135 (L) 150 - 400 K/uL   nRBC 0.0 0.0 - 0.2 %   Neutrophils Relative % 54 %   Neutro Abs 1.6 (L) 1.7 - 7.7 K/uL   Lymphocytes Relative 24 %   Lymphs Abs 0.7 0.7 - 4.0 K/uL   Monocytes Relative 15 %   Monocytes Absolute 0.5 0.1 - 1.0 K/uL   Eosinophils Relative 6 %   Eosinophils Absolute 0.2 0.0 - 0.5 K/uL   Basophils Relative 1 %   Basophils Absolute 0.0 0.0 - 0.1 K/uL   Immature Granulocytes 0 %   Abs Immature Granulocytes 0.01 0.00 - 0.07 K/uL      Assessment & Plan:   Problem List Items Addressed This Visit      Cardiovascular and Mediastinum   CAD (coronary artery disease)    Chronic, stable.  Continue current medication regimen and collaboration with cardiology.  No recent NTG use.      Relevant Medications   metoprolol succinate (TOPROL-XL) 25 MG 24 hr tablet   atorvastatin (LIPITOR) 80 MG tablet   niacin (NIASPAN) 1000 MG CR tablet   Other Relevant Orders   Lipid Panel w/o Chol/HDL Ratio   Essential hypertension    Chronic, stable with BP at goal in office today.  Recommend she monitor BP at least a few mornings a week at home and document.  DASH diet at home.  Continue current medication regimen and adjust as needed.  Labs today.  Return in 6 months.       Relevant Medications   metoprolol succinate (TOPROL-XL) 25 MG 24 hr tablet   atorvastatin (LIPITOR) 80 MG tablet   niacin (NIASPAN) 1000 MG CR tablet   Other Relevant Orders   TSH   Ventricular tachycardia ([place]) - Primary    Continue collaboration with cardiology, no recent use Tambocor.      Relevant Medications   metoprolol succinate  (TOPROL-XL) 25 MG 24 hr tablet   atorvastatin (LIPITOR) 80 MG tablet   niacin (NIASPAN) 1000 MG CR tablet     Digestive   Reflux esophagitis    Chronic, ongoing.  Continue current medication regimen and adjust as needed.  Only uses as needed.  Return in 6 months.        Musculoskeletal and Integument   Osteopenia    Chronic, ongoing.  Recommend starting Vitamin D supplement and calcium daily.  Educated on this.  Obtain Vit D level today and plan on repeat DEXA in 2023.      Relevant Orders   VITAMIN D 25 Hydroxy (Vit-D Deficiency, Fractures)     Genitourinary   BPH (benign prostatic hyperplasia)    Check PSA today.        Other   Hyperlipidemia    Chronic, ongoing.  Continue current medication regimen and adjust as needed.  Lipid panel today.         Relevant Medications   metoprolol succinate (TOPROL-XL) 25 MG 24 hr tablet   atorvastatin (LIPITOR) 80 MG tablet   niacin (NIASPAN) 1000 MG CR tablet   Leukopenia    Continue collaboration with hematology, recent labs reviewed.      Thrombocytopenia ([place])    Continue collaboration with hematology, recent labs reviewed.      Chronic left hip pain    Ongoing for over one year with worsening.  Will obtain imaging, has underlying osteopenia.  Recommend starting Vitamin D3 daily.  Recommend continuing over the counter Tylenol and creams for discomfort.  Alternate heat and ice as needed.  Plan of referral to PT or ortho dependent on imaging findings.      Relevant Orders   DG Hip Unilat W OR W/O Pelvis 2-3 Views Left   DG Lumbar Spine Complete    Other Visit Diagnoses    Prostate cancer screening       PSA on labs today   Relevant Orders   PSA   Annual physical exam       Annual labs today      Discussed aspirin prophylaxis for myocardial infarction prevention and decision was made to continue ASA  LABORATORY TESTING:  Health maintenance labs ordered today as discussed above.   The natural history of prostate  cancer and ongoing controversy regarding screening and potential treatment outcomes of prostate cancer has been discussed with the patient. The meaning of a false positive PSA and a false negative PSA has been discussed. He indicates understanding of the limitations of this screening test and wishes to proceed with screening PSA testing.   IMMUNIZATIONS:   - Tdap: Tetanus vaccination status reviewed: last tetanus booster within 10 years. - Influenza: Up to date at [facility] - Pneumovax: Up to date - Prevnar: Up to date - Zostavax vaccine: Up to date  - Covid vaccines -- up to date  SCREENING: - Colonoscopy: Up to date  Discussed with patient purpose of the colonoscopy is to detect colon cancer at curable precancerous or early stages   -  AAA Screening: Up To Date -- in 2014 -Hearing Test: Not applicable  -Spirometry: Not applicable   PATIENT COUNSELING:    Sexuality: Discussed sexually transmitted diseases, partner selection, use of condoms, avoidance of unintended pregnancy  and contraceptive alternatives.   Advised to avoid cigarette smoking.  I discussed with the patient that most people either abstain from alcohol or drink within safe limits (<=14/week and <=4 drinks/occasion for males, <=7/weeks and <= 3 drinks/occasion for females) and that the risk for alcohol disorders and other health effects rises proportionally with the number of drinks per week and how often a drinker exceeds daily limits.  Discussed cessation/primary prevention of drug use and availability of treatment for abuse.   Diet: Encouraged to adjust caloric intake to maintain  or achieve ideal body weight, to reduce intake of dietary saturated fat and total fat, to limit sodium intake by avoiding high sodium foods and not adding table salt, and to maintain adequate dietary potassium and calcium preferably from fresh fruits, vegetables, and low-fat dairy products.    Stressed the importance of regular  exercise  Injury prevention: Discussed safety belts, safety helmets, smoke detector, smoking near bedding or upholstery.   Dental health: Discussed importance of regular tooth brushing, flossing, and dental visits.   Follow up plan: NEXT PREVENTATIVE PHYSICAL DUE IN 1 YEAR. Return in about 6 months (around 05/29/2021) for HTN/HLD, OSTEOPENIA, GERD.

## 2020-11-28 NOTE — Assessment & Plan Note (Signed)
Chronic, ongoing.  Continue current medication regimen and adjust as needed.  Only uses as needed.  Return in 6 months.

## 2020-11-28 NOTE — Assessment & Plan Note (Signed)
Continue collaboration with hematology, recent labs reviewed. 

## 2020-11-28 NOTE — Assessment & Plan Note (Signed)
Chronic, ongoing.  Continue current medication regimen and adjust as needed. Lipid panel today. 

## 2020-11-28 NOTE — Patient Instructions (Signed)
Your bone density shows thinning bones (osteopenia) but not brittle (osteoporosis).  We recommend Vitamin D supplementation of about 2,0000 IUs of over the counter Vitamin D3.  In addition, we recommend a diet high in calcium with dairy and dark green leafy vegetables.  We would like you to get plenty of weight bearing exercises with walking and resistance training such as light weights or resistance bands available with instructions at places such as [employer].    [facility] -- [place], [place] [address]   Osteopenia  Osteopenia is a loss of thickness (density) inside of the bones. Another name for osteopenia is low bone mass. Mild osteopenia is a normal part of aging. It is not a disease, and it does not cause symptoms. However, if you have osteopenia and continue to lose bone mass, you could develop a condition that causes the bones to become thin and break more easily (osteoporosis). You may also lose some height, have back pain, and have a stooped posture. Although osteopenia is not a disease, making changes to your lifestyle and diet can help to prevent osteopenia from developing into osteoporosis. What are the causes? Osteopenia is caused by loss of calcium in the bones.  Bones are constantly changing. Old bone cells are continually being replaced with new bone cells. This process builds new bone. The mineral calcium is needed to build new bone and maintain bone density. Bone density is usually highest around age 8. After that, most people's bodies cannot replace all the bone they have lost with new bone. What increases the risk? You are more likely to develop this condition if:  You are older than age 80.  You are a woman who went through menopause early.  You have a long illness that keeps you in bed.  You do not get enough exercise.  You lack certain nutrients (malnutrition).  You have an overactive thyroid gland (hyperthyroidism).  You smoke.  You drink  a lot of alcohol.  You are taking medicines that weaken the bones, such as steroids. What are the signs or symptoms? This condition does not cause any symptoms. You may have a slightly higher risk for bone breaks (fractures), so getting fractures more easily than normal may be an indication of osteopenia. How is this diagnosed? Your health care provider can diagnose this condition with a special type of X-ray exam that measures bone density (dual-energy X-ray absorptiometry, DEXA). This test can measure bone density in your hips, spine, and wrists. Osteopenia has no symptoms, so this condition is usually diagnosed after a routine bone density screening test is done for osteoporosis. This routine screening is usually done for:  Women who are age 83 or older.  Men who are age 65 or older. If you have risk factors for osteopenia, you may have the screening test at an earlier age. How is this treated? Making dietary and lifestyle changes can lower your risk for osteoporosis. If you have severe osteopenia that is close to becoming osteoporosis, your health care provider may prescribe medicines and dietary supplements such as calcium and vitamin D. These supplements help to rebuild bone density. Follow these instructions at home:   Take over-the-counter and prescription medicines only as told by your health care provider. These include vitamins and supplements.  Eat a diet that is high in calcium and vitamin D. ? Calcium is found in dairy products, beans, salmon, and leafy green vegetables like spinach and broccoli. ? Look for foods that have  vitamin D and calcium added to them (fortified foods), such as orange juice, cereal, and bread.  Do 30 or more minutes of a weight-bearing exercise every day, such as walking, jogging, or playing a sport. These types of exercises strengthen the bones.  Take precautions at home to lower your risk of falling, such as: ? Keeping rooms well-lit and free of  clutter, such as cords. ? Installing safety rails on stairs. ? Using rubber mats in the bathroom or other areas that are often wet or slippery.  Do not use any products that contain nicotine or tobacco, such as cigarettes and e-cigarettes. If you need help quitting, ask your health care provider.  Avoid alcohol or limit alcohol intake to no more than 1 drink a day for nonpregnant women and 2 drinks a day for men. One drink equals 12 oz of beer, 5 oz of wine, or 1 oz of hard liquor.  Keep all follow-up visits as told by your health care provider. This is important. Contact a health care provider if:  You have not had a bone density screening for osteoporosis and you are: ? A woman, age 7 or older. ? A man, age 73 or older.  You are a postmenopausal woman who has not had a bone density screening for osteoporosis.  You are older than age 23 and you want to know if you should have bone density screening for osteoporosis. Summary  Osteopenia is a loss of thickness (density) inside of the bones. Another name for osteopenia is low bone mass.  Osteopenia is not a disease, but it may increase your risk for a condition that causes the bones to become thin and break more easily (osteoporosis).  You may be at risk for osteopenia if you are older than age 47 or if you are a woman who went through early menopause.  Osteopenia does not cause any symptoms, but it can be diagnosed with a bone density screening test.  Dietary and lifestyle changes are the first treatment for osteopenia. These may lower your risk for osteoporosis. This information is not intended to replace advice given to you by your health care provider. Make sure you discuss any questions you have with your health care provider. Document Revised: 10/31/2017 Document Reviewed: 08/27/2017 Elsevier Patient Education  2020 [employer].

## 2020-11-28 NOTE — Assessment & Plan Note (Signed)
Continue collaboration with hematology, recent labs reviewed.

## 2020-11-29 LAB — LIPID PANEL W/O CHOL/HDL RATIO
Cholesterol, Total: 121 mg/dL (ref 100–199)
HDL: 53 mg/dL (ref >39)
LDL Chol Calc (NIH): 53 mg/dL (ref 0–99)
Triglycerides: 73 mg/dL (ref 0–149)
VLDL Cholesterol Cal: 15 mg/dL (ref 5–40)

## 2020-11-29 LAB — TSH: TSH: 0.564 u[IU]/mL (ref 0.450–4.500)

## 2020-11-29 LAB — VITAMIN D 25 HYDROXY (VIT D DEFICIENCY, FRACTURES): Vit D, 25-Hydroxy: 46.5 ng/mL (ref 30.0–100.0)

## 2020-11-29 LAB — PSA: Prostate Specific Ag, Serum: 0.4 ng/mL (ref 0.0–4.0)

## 2020-11-29 NOTE — Progress Notes (Signed)
Contacted via MyChart   Good afternoon Shawn Shepard, your labs have returned and they are all within normal ranges.  Your cholesterol levels are beautiful with LDL well below goal.  Continue all current medications:) Have a Happy New Year!! Keep being awesome!!  Thank you for allowing me to participate in your care. Kindest regards, Angel Hobdy

## 2020-12-04 ENCOUNTER — Other Ambulatory Visit: Payer: Self-pay | Admitting: Nurse Practitioner

## 2020-12-04 MED ORDER — PREDNISONE 20 MG PO TABS
40.0000 mg | ORAL_TABLET | Freq: Every day | ORAL | 0 refills | Status: AC
Start: 2020-12-04 — End: 2020-12-09

## 2020-12-11 NOTE — Addendum Note (Signed)
Addended by: Marnee Guarneri T on: 12/11/2020 04:24 PM   Modules accepted: Orders

## 2020-12-20 ENCOUNTER — Other Ambulatory Visit: Payer: Self-pay

## 2020-12-20 ENCOUNTER — Ambulatory Visit
Admission: RE | Admit: 2020-12-20 | Discharge: 2020-12-20 | Disposition: A | Discharge status: Home / Self Care | Payer: Medicare PPO | Attending: Nurse Practitioner | Admitting: Nurse Practitioner

## 2020-12-20 ENCOUNTER — Ambulatory Visit
Admission: RE | Admit: 2020-12-20 | Discharge: 2020-12-20 | Disposition: A | Discharge status: Home / Self Care | Payer: Medicare PPO | Source: Ambulatory Visit | Attending: Nurse Practitioner | Admitting: Nurse Practitioner

## 2020-12-20 DIAGNOSIS — G8929 Other chronic pain: Secondary | ICD-10-CM

## 2020-12-20 DIAGNOSIS — M25552 Pain in left hip: Secondary | ICD-10-CM | POA: Diagnosis present

## 2020-12-20 NOTE — Progress Notes (Signed)
Contacted via MyChart   Good evening Phil, your imaging has returned.  Hip imaging was normal, showing no arthritic changes.  The lumbar, lower back, imaging did show what is possibly causing this long term pain though.  You do have moderate lower back degenerative disc disease at many levels.  There is some spondylolisthesis -- it occurs when one of your vertebrae, your spinal bones, slips out of place onto the vertebra below it.  You also have moderate facet arthropathy, which is arthritis of the facet joints between each vertebra (spinal bone) -- this is noted on left side more.  So overall this is most likely cause of this left hip pain.  I would recommend we start some physical therapy and if ongoing then perhaps get you into ortho in the future.  What are your thoughts? Keep being awesome!!  Thank you for allowing me to participate in your care. Kindest regards, Erice Ahles

## 2021-01-29 ENCOUNTER — Other Ambulatory Visit: Payer: Self-pay

## 2021-01-29 DIAGNOSIS — D696 Thrombocytopenia, unspecified: Secondary | ICD-10-CM

## 2021-01-29 DIAGNOSIS — D72819 Decreased white blood cell count, unspecified: Secondary | ICD-10-CM

## 2021-01-30 ENCOUNTER — Encounter: Payer: Self-pay | Admitting: Oncology

## 2021-01-30 ENCOUNTER — Other Ambulatory Visit: Payer: Self-pay

## 2021-01-30 ENCOUNTER — Inpatient Hospital Stay: Payer: Medicare PPO | Admitting: Oncology

## 2021-01-30 ENCOUNTER — Inpatient Hospital Stay: Payer: Medicare PPO | Attending: Oncology

## 2021-01-30 VITALS — BP 113/73 | HR 65 | Temp 97.1°F | Resp 16 | Wt 198.6 lb

## 2021-01-30 DIAGNOSIS — D72819 Decreased white blood cell count, unspecified: Secondary | ICD-10-CM | POA: Insufficient documentation

## 2021-01-30 DIAGNOSIS — D696 Thrombocytopenia, unspecified: Secondary | ICD-10-CM

## 2021-01-30 DIAGNOSIS — Z87891 Personal history of nicotine dependence: Secondary | ICD-10-CM | POA: Diagnosis not present

## 2021-01-30 DIAGNOSIS — Z85828 Personal history of other malignant neoplasm of skin: Secondary | ICD-10-CM | POA: Diagnosis not present

## 2021-01-30 LAB — COMPREHENSIVE METABOLIC PANEL
ALT: 22 U/L (ref 0–44)
AST: 24 U/L (ref 15–41)
Albumin: 3.9 g/dL (ref 3.5–5.0)
Alkaline Phosphatase: 67 U/L (ref 38–126)
Anion gap: 8 (ref 5–15)
BUN: 14 mg/dL (ref 8–23)
CO2: 24 mmol/L (ref 22–32)
Calcium: 9.9 mg/dL (ref 8.9–10.3)
Chloride: 106 mmol/L (ref 98–111)
Creatinine, Ser: 0.83 mg/dL (ref 0.61–1.24)
GFR, Estimated: >60 mL/min (ref >60)
Glucose, Bld: 100 mg/dL — ABNORMAL HIGH (ref 70–99)
Potassium: 4.3 mmol/L (ref 3.5–5.1)
Sodium: 138 mmol/L (ref 135–145)
Total Bilirubin: 1.3 mg/dL — ABNORMAL HIGH (ref 0.3–1.2)
Total Protein: 6.5 g/dL (ref 6.5–8.1)

## 2021-01-30 LAB — CBC WITH DIFFERENTIAL/PLATELET
Abs Immature Granulocytes: 0.01 10*3/uL (ref 0.00–0.07)
Basophils Absolute: 0 10*3/uL (ref 0.0–0.1)
Basophils Relative: 1 %
Eosinophils Absolute: 0.2 10*3/uL (ref 0.0–0.5)
Eosinophils Relative: 6 %
HCT: 41 % (ref 39.0–52.0)
Hemoglobin: 13.5 g/dL (ref 13.0–17.0)
Immature Granulocytes: 0 %
Lymphocytes Relative: 30 %
Lymphs Abs: 0.8 10*3/uL (ref 0.7–4.0)
MCH: 30.8 pg (ref 26.0–34.0)
MCHC: 32.9 g/dL (ref 30.0–36.0)
MCV: 93.6 fL (ref 80.0–100.0)
Monocytes Absolute: 0.4 10*3/uL (ref 0.1–1.0)
Monocytes Relative: 15 %
Neutro Abs: 1.2 10*3/uL — ABNORMAL LOW (ref 1.7–7.7)
Neutrophils Relative %: 48 %
Platelets: 140 10*3/uL — ABNORMAL LOW (ref 150–400)
RBC: 4.38 MIL/uL (ref 4.22–5.81)
RDW: 13.3 % (ref 11.5–15.5)
WBC: 2.6 10*3/uL — ABNORMAL LOW (ref 4.0–10.5)
nRBC: 0 % (ref 0.0–0.2)

## 2021-01-30 LAB — PATHOLOGIST SMEAR REVIEW

## 2021-01-30 LAB — VITAMIN B12: Vitamin B-12: 773 pg/mL (ref 180–914)

## 2021-01-30 NOTE — Progress Notes (Signed)
Hematology/Oncology follow-up note [facility] [phone] [phone]   Patient Care Team: Venita Lick, NP as PCP - General (Nurse Practitioner) [name], MD as Consulting Physician (Cardiology) [name], MD as Consulting Physician (Oncology)  REFERRING PROVIDER: Venita Lick, NP  CHIEF COMPLAINTS/REASON FOR VISIT:  Follow-up for management of leukopenia and thrombocytopenia  HISTORY OF PRESENTING ILLNESS:  Shawn Kitchens Sr. is a  75 y.o.  male with PMH listed below who was referred to me for evaluation of leukopenia. Reviewed patient's recent labs. Patient has low total WBC count was 2.6, predominantly absolute lymphocyte 0.6. Earlier lab lab done 11/2019 showed WBC 2.8, lymphocyte 0.7, neutrophil 1.4.  Previous lab records reviewed. 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 No aggravating or improving factors.  Associated symptoms:  denies fatigue, weight loss, fever, chills, frequent infection.  History hepatitis or HIV infection: Denies History of chronic liver disease: Denies History of blood transfusion: Denies Alcohol consumption: Denies Diet Vegetarian or Vegan: Denies Herbal medication: Denies  Reports feeling quite tired and fatigued recently.  He and his wife recently downsized to a one-story home and he has been moving a lot of furniture's by himself.  He feels it took a week for his knee to recover after the most. Denies any unintentional weight loss, fever or chills, night sweating. He is very active, walks every day. Previous occupation was a Product/process development scientist.  Drinks a cocktail every day.  Usually drinks cocktail with quinine tonic water for lower extremity cramps.  He started to do this after his ventricular tachycardic episodes about 1-1/2 years ago. Former smoker, quitting in 1972.  INTERVAL HISTORY Shawn WINSKI Sr. is a 75 y.o. male who has above history  reviewed by me today presents for follow up visit for management of leukopenia and thrombocytopenia. Problems and complaints are listed below: Patient reports feeling well. COVID-19 positive in January 2022. Denies any constitutional symptoms. He has chronic back pain and had a lumbar spine x-ray done on 12/20/2020 which showed multilevel moderate lumbar degenerative disc disease, spine spondylolisthesis, facet arthropathy   Review of Systems  Constitutional: Negative for chills, fever, malaise/fatigue and weight loss.  HENT: Negative for sore throat.   Eyes: Negative for redness.  Respiratory: Negative for cough, shortness of breath and wheezing.   Cardiovascular: Negative for chest pain, palpitations and leg swelling.  Gastrointestinal: Negative for abdominal pain, blood in stool, nausea and vomiting.  Genitourinary: Negative for dysuria.  Musculoskeletal: Negative for myalgias.  Skin: Negative for rash.  Neurological: Negative for dizziness, tingling and tremors.  Endo/Heme/Allergies: Does not bruise/bleed easily.  Psychiatric/Behavioral: Negative for hallucinations.    MEDICAL HISTORY:  Past Medical History:  Diagnosis Date  . Arthritis   . Basal cell carcinoma 1999   basal cell/skin  . CAD (coronary artery disease)   . Cataract Cataract surgery 2017  . GERD (gastroesophageal reflux disease)   . History of bone marrow biopsy   . History of kidney stones 2013   passed stone on his own  . Hyperlipidemia   . Hypertension   . Leukopenia   . Shingles   . Ventricular tachycardia ([place])     SURGICAL HISTORY: Past Surgical History:  Procedure Laterality Date  . CARDIAC CATHETERIZATION N/A 01/16/2016   Procedure: Left Heart Cath and Coronary Angiography;  Surgeon: [name], MD;  Location: [facility];  Service: Cardiovascular;  Laterality: N/A;  . CATARACT EXTRACTION Left 2017  .  COLONOSCOPY WITH PROPOFOL N/A 03/27/2020   Procedure: COLONOSCOPY WITH PROPOFOL;   Surgeon: [name], MD;  Location: [facility];  Service: Endoscopy;  Laterality: N/A;  priority 4  . CORONARY ANGIOPLASTY  2000 and 2003  . EYE SURGERY  2017   Cateract  . GANGLION CYST EXCISION  1979  . KNEE ARTHROSCOPY WITH MEDIAL MENISECTOMY Left 12/01/2017   Procedure: KNEE ARTHROSCOPY WITH MEDIAL MENISECTOMY;  Surgeon: [name], MD;  Location: [facility];  Service: Orthopedics;  Laterality: Left;  Partial  . LEFT HEART CATH Right 08/07/2017   Procedure: Left Heart Cath;  Surgeon: [name], MD;  Location: [facility];  Service: Cardiovascular;  Laterality: Right;  . PILONIDAL CYST EXCISION  1977  . POLYPECTOMY  03/27/2020   Procedure: POLYPECTOMY;  Surgeon: [name], MD;  Location: [facility];  Service: Endoscopy;;  . SKIN CANCER EXCISION  1999   ear  . SYNOVECTOMY Left 12/01/2017   Procedure: SYNOVECTOMY;  Surgeon: [name], MD;  Location: [facility];  Service: Orthopedics;  Laterality: Left;  Partial  . V TACH ABLATION N/A 08/14/2017   Procedure: V Tach Ablation;  Surgeon: [name], MD;  Location: [facility];  Service: Cardiovascular;  Laterality: N/A;  . VEIN SURGERY  2000   laser    SOCIAL HISTORY: Social History   Socioeconomic History  . Marital status: Married    Spouse name: Not on file  . Number of children: 2  . Years of education: Not on file  . Highest education level: Master's degree (e.g., MA, MS, MEng, MEd, MSW, MBA)  Occupational History  . Occupation: retired  Tobacco Use  . Smoking status: Former Smoker    Packs/day: 1.00    Years: 5.00    Pack years: 5.00    Types: Cigarettes    Quit date: 06/21/1971    Years since quitting: 49.6  . Smokeless tobacco: Former Systems developer    Types: Chew    Quit date: 06/20/1980  Vaping Use  . Vaping Use: Never used  Substance and Sexual Activity  . Alcohol use: Not Currently    Alcohol/week: 0.0 standard drinks  . Drug use: No  . Sexual activity: Not  Currently  Other Topics Concern  . Not on file  Social History Narrative   Lives in [place]   Retired school principal   Social Determinants of Health   Financial Resource Strain: [place]   . Difficulty of Paying Living Expenses: Not hard at all  Food Insecurity: No Food Insecurity  . Worried About Charity fundraiser in the Last Year: Never true  . Ran Out of Food in the Last Year: Never true  Transportation Needs: No Transportation Needs  . Lack of Transportation (Medical): No  . Lack of Transportation (Non-Medical): No  Physical Activity: Insufficiently Active  . Days of Exercise per Week: 3 days  . Minutes of Exercise per Session: 40 min  Stress: No Stress Concern Present  . Feeling of Stress : Not at all  Social Connections: Not on file  Intimate Partner Violence: Not on file    FAMILY HISTORY: Family History  Problem Relation Age of Onset  . Hypertension Mother   . Heart disease Mother   . Heart attack Mother   . Diabetes Mother   . Alzheimer's disease Father   . Congestive Heart Failure Brother   . Hypertension Brother   . Other Brother  dementia  . Heart disease Brother   . Cancer Maternal Grandfather   . Brain cancer Daughter 43  . Cancer Daughter   . Cancer Maternal Uncle   . Heart disease Son     ALLERGIES:  has No Known Allergies.  MEDICATIONS:  Current Outpatient Medications  Medication Sig Dispense Refill  . acetaminophen (TYLENOL) 500 MG tablet Take 1,000 mg by mouth every 6 (six) hours as needed for moderate pain or headache.    Marland Kitchen aspirin EC 81 MG tablet Take 81 mg by mouth daily.    Marland Kitchen atorvastatin (LIPITOR) 80 MG tablet Take 1 tablet (80 mg total) by mouth at bedtime. 90 tablet 4  . flecainide (TAMBOCOR) 100 MG tablet Take 2 tablets (200 mg total) as needed by mouth. As needed for recurrent tachycardia.  Do not repeat more than once per day or 3x per wk 2 tablet 3  . metoprolol succinate (TOPROL-XL) 25 MG 24 hr tablet Take 1 tablet (25  mg total) by mouth daily. 90 tablet 4  . niacin (NIASPAN) 1000 MG CR tablet TAKE 1 TABLET(1000 MG) BY MOUTH AT BEDTIME 90 tablet 4  . nitroGLYCERIN (NITROSTAT) 0.4 MG SL tablet nitroglycerin 0.4 mg subl    . pantoprazole (PROTONIX) 40 MG tablet TAKE 1 TABLET(40 MG) BY MOUTH DAILY 90 tablet 0  . ramipril (ALTACE) 10 MG capsule Take 10 mg by mouth daily.    . vitamin B-12 (CYANOCOBALAMIN) 1000 MCG tablet Take 1 tablet (1,000 mcg total) by mouth daily. 90 tablet 0   No current facility-administered medications for this visit.     PHYSICAL EXAMINATION: ECOG PERFORMANCE STATUS: 0 - Asymptomatic Vitals:   01/30/21 1016  BP: 113/73  Pulse: 65  Resp: 16  Temp: (!) 97.1 F (36.2 C)   Filed Weights   01/30/21 1016  Weight: 198 lb 9.6 oz (90.1 kg)    Physical Exam Constitutional:      General: He is not in acute distress. HENT:     Head: Normocephalic and atraumatic.  Eyes:     General: No scleral icterus.    Pupils: Pupils are equal, round, and reactive to light.  Cardiovascular:     Rate and Rhythm: Normal rate and regular rhythm.     Heart sounds: Normal heart sounds.  Pulmonary:     Effort: Pulmonary effort is normal. No respiratory distress.     Breath sounds: No wheezing.  Abdominal:     General: Bowel sounds are normal. There is no distension.     Palpations: Abdomen is soft. There is no mass.     Tenderness: There is no abdominal tenderness.  Musculoskeletal:        General: No deformity. Normal range of motion.     Cervical back: Normal range of motion and neck supple.  Skin:    General: Skin is warm and dry.     Findings: No erythema or rash.  Neurological:     Mental Status: He is alert and oriented to person, place, and time.     Cranial Nerves: No cranial nerve deficit.     Coordination: Coordination normal.  Psychiatric:        Behavior: Behavior normal.        Thought Content: Thought content normal.      LABORATORY DATA:  I have reviewed the data as  listed Lab Results  Component Value Date   WBC 2.6 (L) 01/30/2021   HGB 13.5 01/30/2021   HCT 41.0 01/30/2021   MCV  93.6 01/30/2021   PLT 140 (L) 01/30/2021   Recent Labs    05/25/20 1015 08/03/20 1117 01/30/21 0953  NA 141 138 138  K 4.7 4.5 4.3  CL 106 104 106  CO2 '26 27 24  ' GLUCOSE 97 97 100*  BUN '12 14 14  ' CREATININE 0.99 0.90 0.83  CALCIUM 10.3* 9.6 9.9  GFRNONAA 76 >60 >60  GFRAA 88 >60  --   PROT 6.2 6.4* 6.5  ALBUMIN 4.2 3.8 3.9  AST '24 23 24  ' ALT '19 22 22  ' ALKPHOS 79 59 67  BILITOT 0.8 1.1 1.3*   Iron/TIBC/Ferritin/ %Sat No results found for: IRON, TIBC, FERRITIN, IRONPCTSAT     ASSESSMENT & PLAN:  1. Thrombocytopenia (HCC)   2. Leukopenia, unspecified type    Chronic leukopenia, predominantly neutropenia And thrombocytopenia. He has previously had extensive work-up in the past. normal smear, normal folate level, normal B12 level, hepatitis negative, negative HIV, negative flow cytometry.  Multiple myeloma panel was not remarkable except slightly increased IgM level.  Ultrasound abdomen showed normal spleen size.Normal ANA, CRP, ESR.  No longer on alcohol and quinine-containing tonic water Previous bone marrow biopsy on 10/26/2019 also showed no significant dyspoiesis or increasing blast cells.  No significant lymphoid aggregates.  Normal cytogenetics. Labs are reviewed and discussed with patient. Slightly lowe WBC than his baseline.  2.6, absolute neutrophil 1.2.  Platelet count 140,000 stable. He is asymptomatic.  Recommend monitor Repeat blood work in 3 months.  Orders Placed This Encounter  Procedures  . Pathologist smear review    Standing Status:   Future    Number of Occurrences:   1    Standing Expiration Date:   01/30/2022    We spent sufficient time to discuss many aspect of care, questions were answered to patient's satisfaction. [name], MD, PhD  01/30/2021

## 2021-01-30 NOTE — Progress Notes (Signed)
Patient denies new problems/concerns today.   °

## 2021-01-31 ENCOUNTER — Other Ambulatory Visit: Payer: Medicare PPO

## 2021-01-31 ENCOUNTER — Ambulatory Visit: Payer: Medicare PPO | Admitting: Oncology

## 2021-02-08 HISTORY — PX: HAND SURGERY: SHX662

## 2021-02-13 ENCOUNTER — Other Ambulatory Visit: Payer: Self-pay | Admitting: Nurse Practitioner

## 2021-02-13 NOTE — Telephone Encounter (Signed)
Routing to provider  

## 2021-02-13 NOTE — Telephone Encounter (Signed)
  Notes to clinic:  looks like medication was d/c in error Please review for continue use and refill    Requested Prescriptions  Pending Prescriptions Disp Refills   ALLERGY RELIEF 180 MG tablet [Pharmacy Med Name: FEXOFENADINE 180MG  TABLETS (OTC)] 90 tablet 0    Sig: TAKE 1 TABLET BY MOUTH EVERY DAY      Ear, Nose, and Throat:  Antihistamines Passed - 02/13/2021 11:32 AM      Passed - Valid encounter within last 12 months    Recent Outpatient Visits           2 months ago Ventricular tachycardia [facility])   [place], Henrine Screws T, NP   8 months ago Essential hypertension   [name], [place]   1 year ago Essential hypertension   [name], [place]   1 year ago Essential hypertension   [place], Jeannette How, MD   2 years ago Essential hypertension   [place], Jeannette How, MD       Future Appointments             In 2 months [name], MD [facility]   In 3 months [place], Barbaraann Faster, NP [facility]   In 7 months  [facility]

## 2021-04-22 IMAGING — CR DG HIP (WITH OR WITHOUT PELVIS) 2-3V*L*
1 series · 3 of 3 positions shown · non-contrast
Comparison: None.

CLINICAL DATA: Atraumatic lower back and posterior left hip pain x1
year.

EXAM:
DG HIP (WITH OR WITHOUT PELVIS) 2-3V LEFT

[Series 1: dg hip unilat w or w/o pelvis 2-3 views  · non-contrast · 0.14mm/px · 3 of 3 slices shown]
[im 1/3]
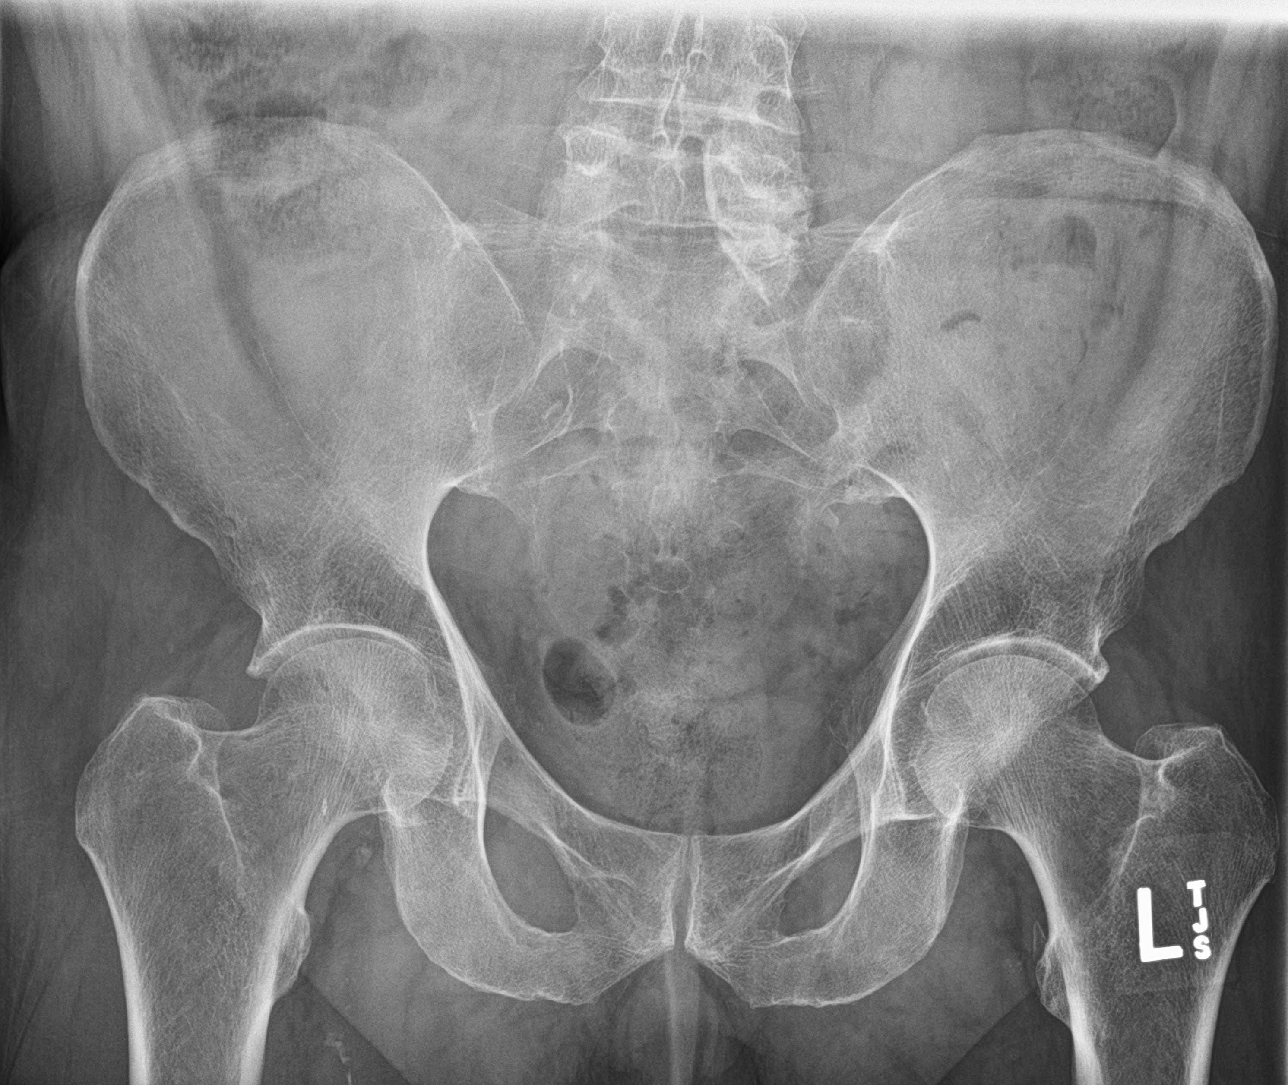
[im 2/3]
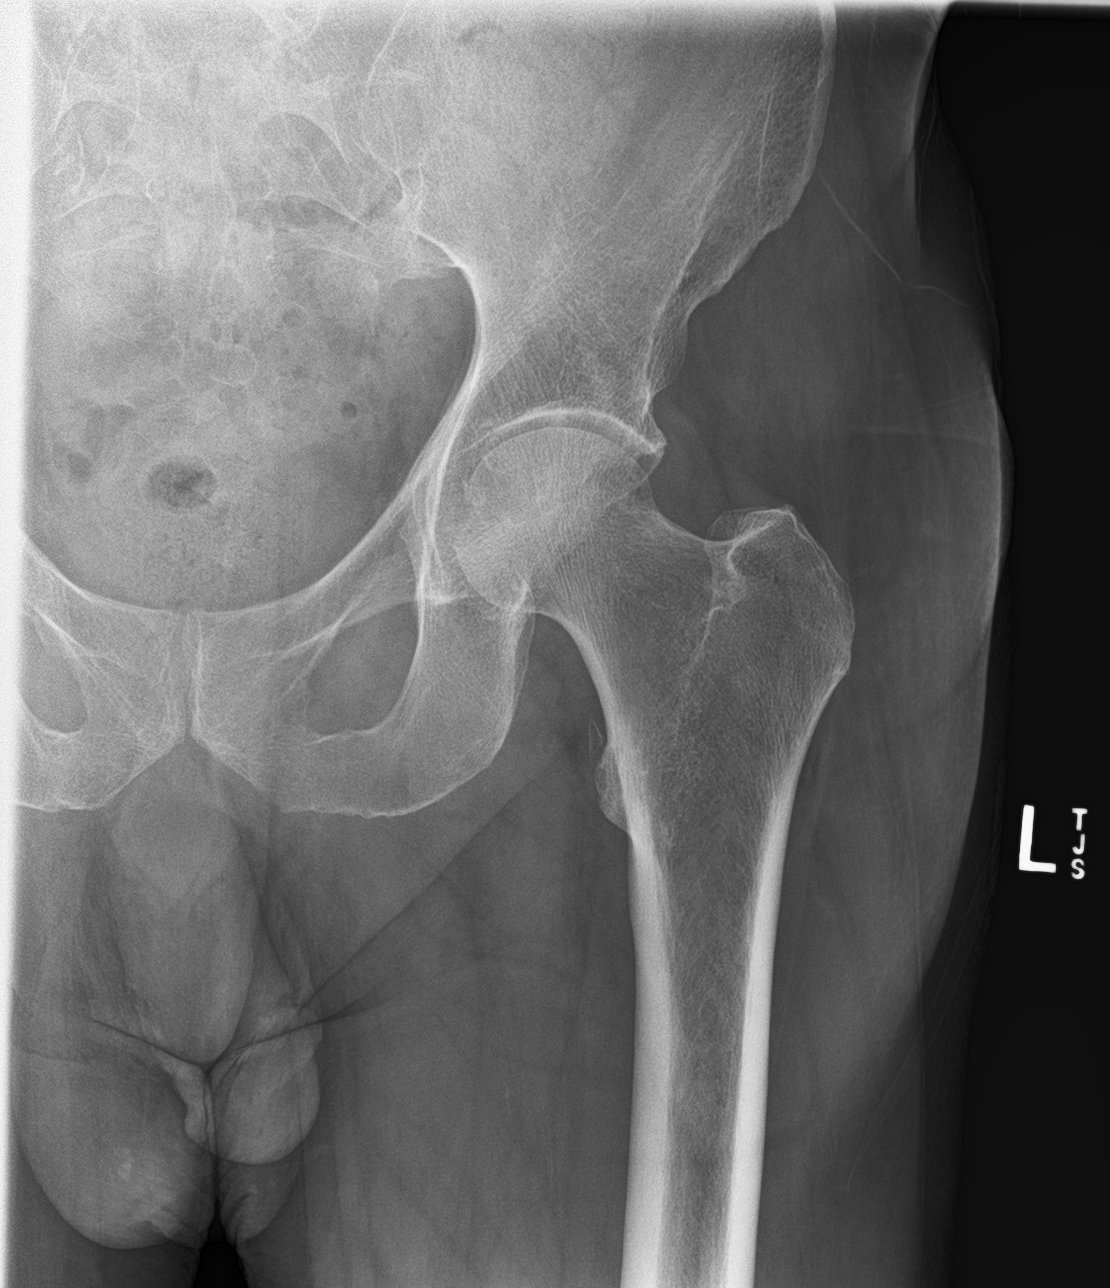
[im 3/3]
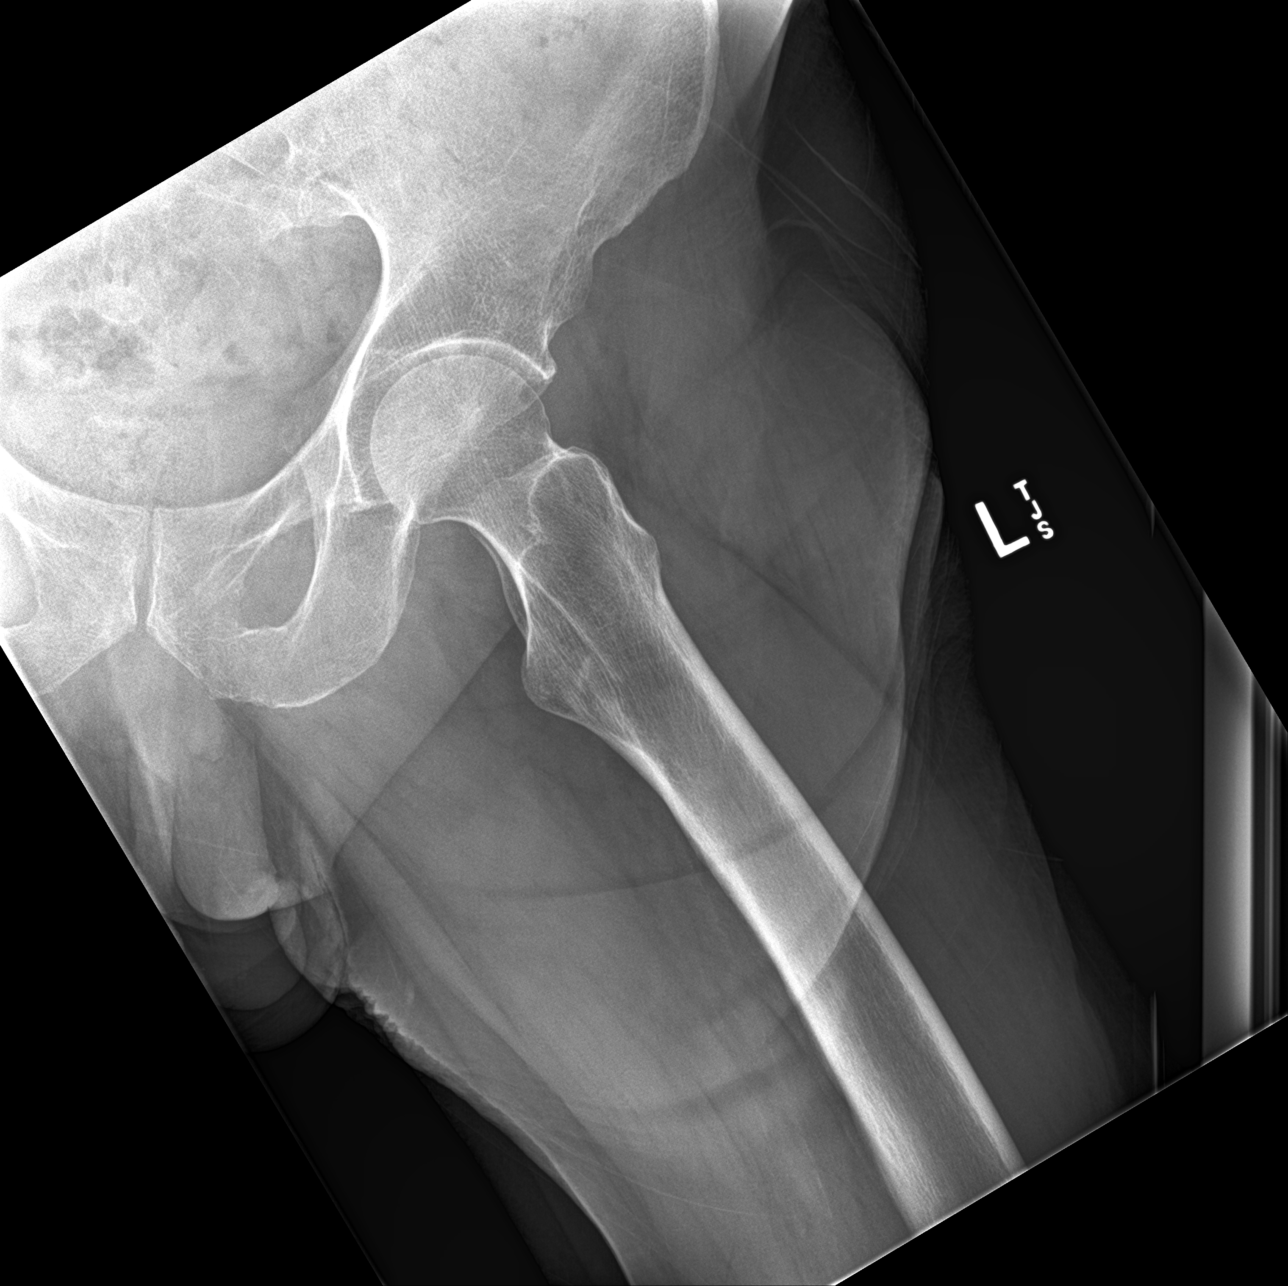

[3 of 3 positions shown; findings below may reference images not displayed]

FINDINGS: There is no evidence of hip fracture or dislocation. There is no
evidence of arthropathy or other focal bone abnormality.
IMPRESSION: Negative.

## 2021-04-22 IMAGING — CR DG LUMBAR SPINE COMPLETE 4+V
1 series · 5 of 5 positions shown · non-contrast
Comparison: None.

CLINICAL DATA: Chronic worsening low back and left hip pain for 1
year. No reported injury.

EXAM:
LUMBAR SPINE - COMPLETE 4+ VIEW

[Series 1: dg lumbar spine complete 4 +v · 0.14mm/px · 5 of 5 slices shown]
[im 1/5]
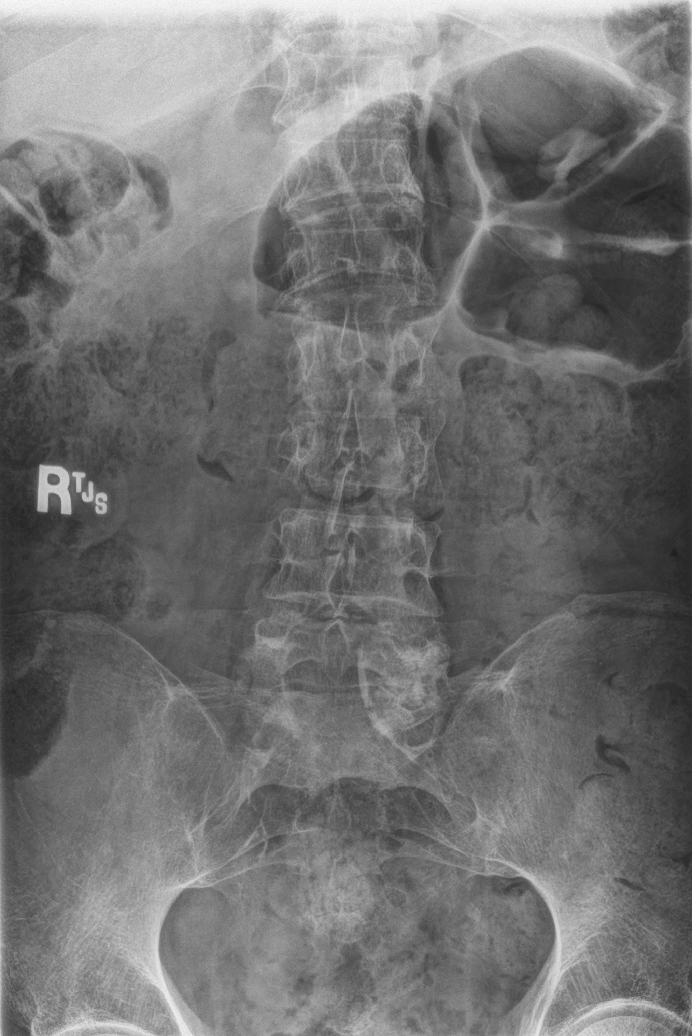
[im 2/5]
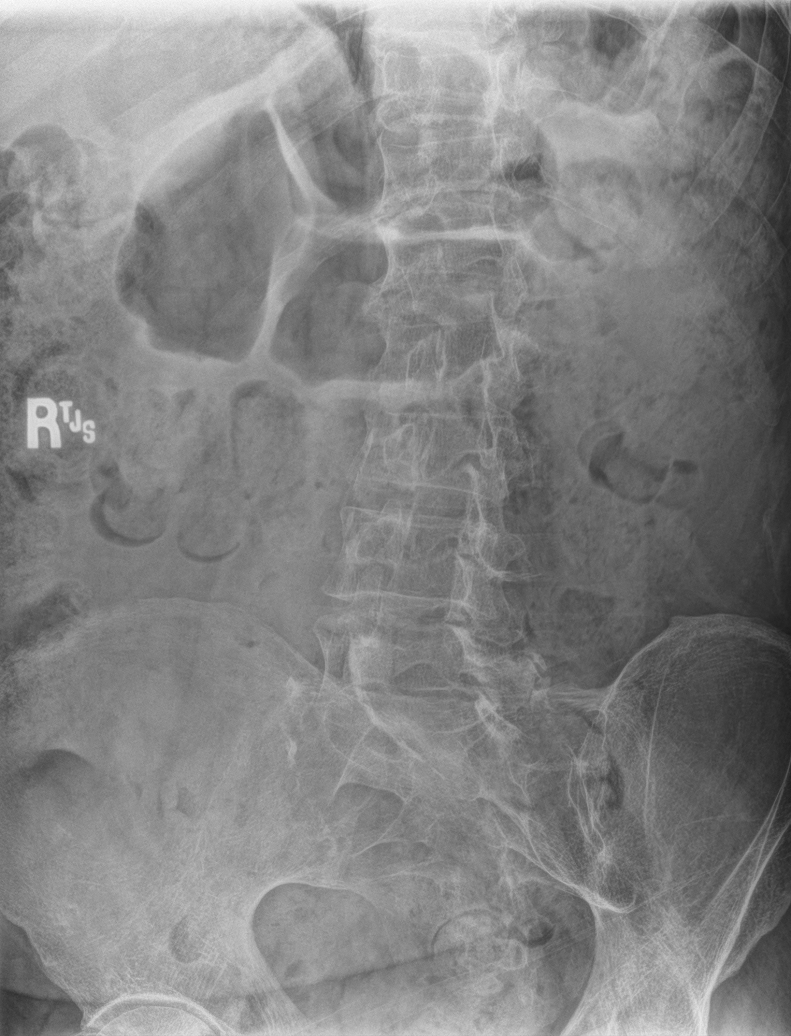
[im 3/5]
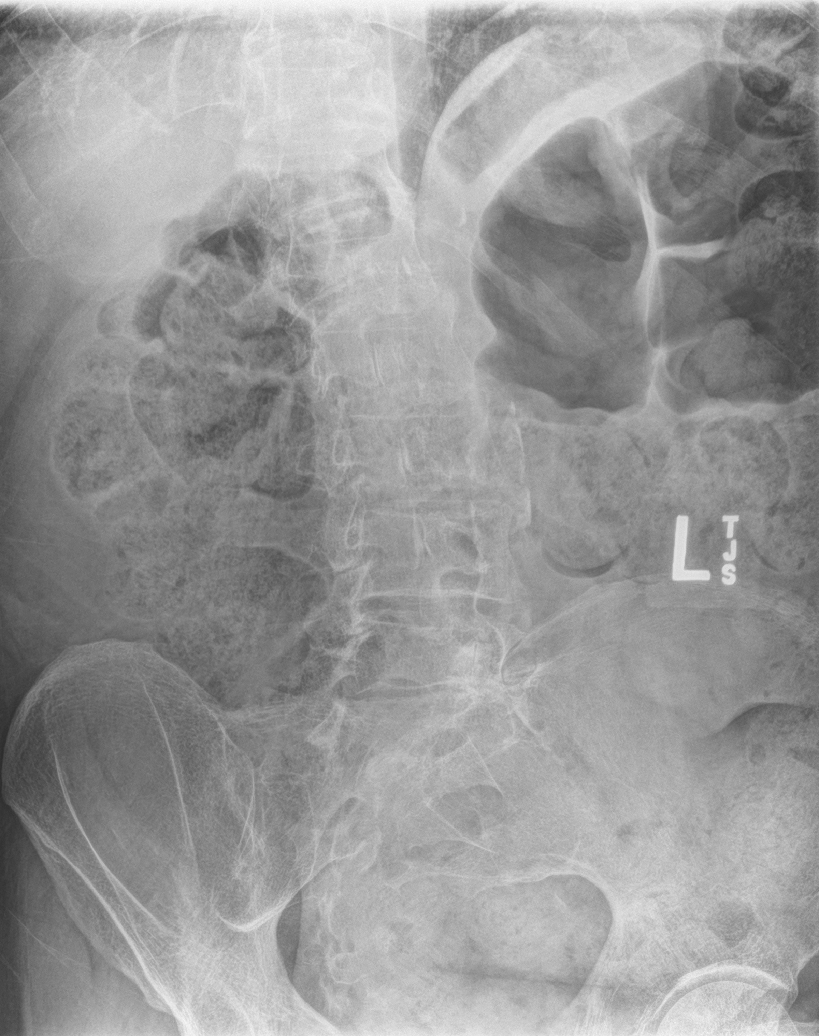
[im 4/5]
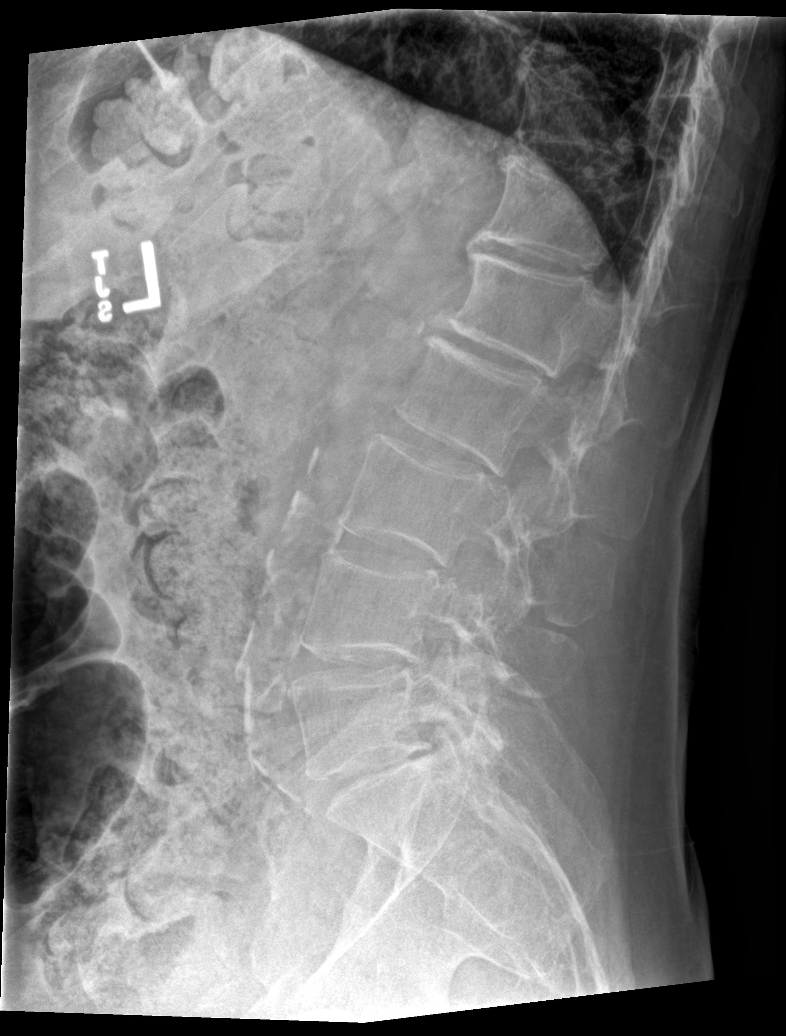
[im 5/5]
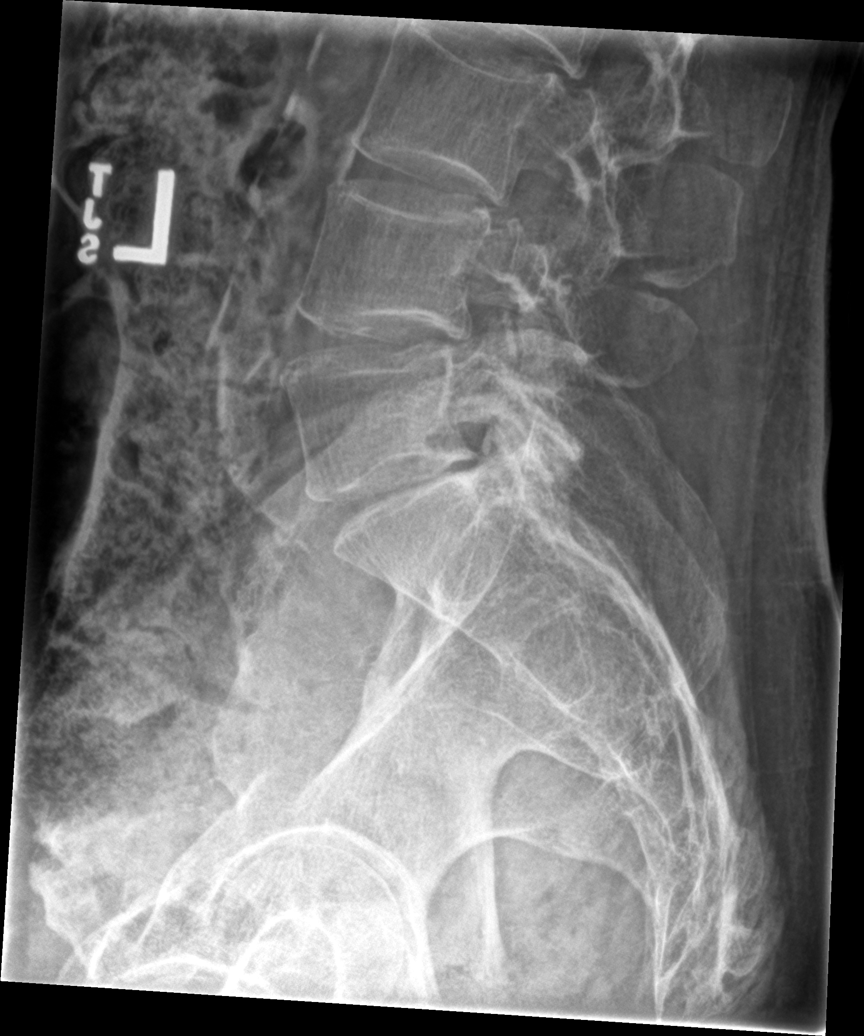

[5 of 5 positions shown; findings below may reference images not displayed]

FINDINGS: This report assumes 5 non rib-bearing lumbar vertebrae.

Lumbar vertebral body heights are preserved, with no fracture.

Moderate multilevel lumbar degenerative disc disease, most prominent
at L1-2 and L5-S1. There is mild 4 mm retrolisthesis at L1-2, 5 mm
retrolisthesis at L2-3, 2 mm retrolisthesis at L3-4 and 3 mm
retrolisthesis at L4-5. Moderate lower lumbar facet arthropathy,
asymmetric to the left. No aggressive appearing focal osseous
lesions. Abdominal aortic atherosclerosis.
IMPRESSION: 1. Moderate multilevel lumbar degenerative disc disease.
2. Mild multilevel lumbar spondylolisthesis.
3. Moderate lower lumbar facet arthropathy, asymmetric to the left.

## 2021-05-01 ENCOUNTER — Other Ambulatory Visit: Payer: Medicare PPO

## 2021-05-02 ENCOUNTER — Other Ambulatory Visit: Payer: Self-pay

## 2021-05-02 MED ORDER — PANTOPRAZOLE SODIUM 40 MG PO TBEC: DELAYED_RELEASE_TABLET | ORAL | 4 refills | Status: DC

## 2021-05-02 NOTE — Telephone Encounter (Signed)
Patient last seen 11/28/20 and has appointment 05/29/21

## 2021-05-03 ENCOUNTER — Other Ambulatory Visit: Payer: Self-pay

## 2021-05-03 ENCOUNTER — Inpatient Hospital Stay: Payer: Medicare PPO | Attending: Oncology

## 2021-05-03 DIAGNOSIS — Z87891 Personal history of nicotine dependence: Secondary | ICD-10-CM | POA: Diagnosis not present

## 2021-05-03 DIAGNOSIS — I2510 Atherosclerotic heart disease of native coronary artery without angina pectoris: Secondary | ICD-10-CM | POA: Diagnosis not present

## 2021-05-03 DIAGNOSIS — D72819 Decreased white blood cell count, unspecified: Secondary | ICD-10-CM

## 2021-05-03 DIAGNOSIS — E785 Hyperlipidemia, unspecified: Secondary | ICD-10-CM | POA: Diagnosis not present

## 2021-05-03 DIAGNOSIS — D696 Thrombocytopenia, unspecified: Secondary | ICD-10-CM

## 2021-05-03 DIAGNOSIS — D709 Neutropenia, unspecified: Secondary | ICD-10-CM

## 2021-05-03 DIAGNOSIS — Z79899 Other long term (current) drug therapy: Secondary | ICD-10-CM | POA: Insufficient documentation

## 2021-05-03 DIAGNOSIS — Z7982 Long term (current) use of aspirin: Secondary | ICD-10-CM | POA: Diagnosis not present

## 2021-05-03 DIAGNOSIS — K219 Gastro-esophageal reflux disease without esophagitis: Secondary | ICD-10-CM | POA: Insufficient documentation

## 2021-05-03 DIAGNOSIS — I10 Essential (primary) hypertension: Secondary | ICD-10-CM | POA: Diagnosis not present

## 2021-05-03 LAB — CBC WITH DIFFERENTIAL/PLATELET
Abs Immature Granulocytes: 0.01 10*3/uL (ref 0.00–0.07)
Basophils Absolute: 0 10*3/uL (ref 0.0–0.1)
Basophils Relative: 1 %
Eosinophils Absolute: 0.2 10*3/uL (ref 0.0–0.5)
Eosinophils Relative: 7 %
HCT: 40.1 % (ref 39.0–52.0)
Hemoglobin: 13.5 g/dL (ref 13.0–17.0)
Immature Granulocytes: 0 %
Lymphocytes Relative: 31 %
Lymphs Abs: 0.8 10*3/uL (ref 0.7–4.0)
MCH: 31.5 pg (ref 26.0–34.0)
MCHC: 33.7 g/dL (ref 30.0–36.0)
MCV: 93.7 fL (ref 80.0–100.0)
Monocytes Absolute: 0.4 10*3/uL (ref 0.1–1.0)
Monocytes Relative: 17 %
Neutro Abs: 1.2 10*3/uL — ABNORMAL LOW (ref 1.7–7.7)
Neutrophils Relative %: 44 %
Platelets: 139 10*3/uL — ABNORMAL LOW (ref 150–400)
RBC: 4.28 MIL/uL (ref 4.22–5.81)
RDW: 13.8 % (ref 11.5–15.5)
WBC: 2.6 10*3/uL — ABNORMAL LOW (ref 4.0–10.5)
nRBC: 0 % (ref 0.0–0.2)

## 2021-05-04 ENCOUNTER — Inpatient Hospital Stay (HOSPITAL_BASED_OUTPATIENT_CLINIC_OR_DEPARTMENT_OTHER): Payer: Medicare PPO | Admitting: Nurse Practitioner

## 2021-05-04 ENCOUNTER — Encounter: Payer: Self-pay | Admitting: Nurse Practitioner

## 2021-05-04 DIAGNOSIS — D72819 Decreased white blood cell count, unspecified: Secondary | ICD-10-CM | POA: Diagnosis not present

## 2021-05-04 DIAGNOSIS — D696 Thrombocytopenia, unspecified: Secondary | ICD-10-CM

## 2021-05-04 DIAGNOSIS — D709 Neutropenia, unspecified: Secondary | ICD-10-CM

## 2021-05-04 NOTE — Progress Notes (Signed)
Hematology/Oncology follow-up note [facility] Telephone:([phone] [phone]  Virtual Visit Progress Note  I connected with Shawn Quest Mower Sr. on 05/04/21 at  1:45 PM EDT by video enabled telemedicine visit and verified that I am speaking with the correct person using two identifiers.   I discussed the limitations, risks, security and privacy concerns of performing an evaluation and management service by telemedicine and the availability of in-person appointments. I also discussed with the patient that there may be a patient responsible charge related to this service. The patient expressed understanding and agreed to proceed.   Other persons participating in the visit and their role in the encounter: Patient's wife  Patient's location: Home Provider's location: [facility]   Patient Care Team: Venita Lick, NP as PCP - General (Nurse Practitioner) [name], MD as Consulting Physician (Cardiology) [name], MD as Consulting Physician (Oncology)  REFERRING PROVIDER: Venita Lick, NP   CHIEF COMPLAINTS/REASON FOR VISIT:  Follow-up for management of leukopenia and thrombocytopenia  HISTORY OF PRESENTING ILLNESS:  Shawn Kitchens Sr. is a  75 y.o.  male with PMH listed below who was referred to me for evaluation of leukopenia. Reviewed patient's recent labs. Patient has low total WBC count was 2.6, predominantly absolute lymphocyte 0.6. Earlier lab lab done 11/2019 showed WBC 2.8, lymphocyte 0.7, neutrophil 1.4.  Previous lab records reviewed. 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 No aggravating or improving factors.  Associated symptoms:  denies fatigue, weight loss, fever, chills, frequent infection.  History hepatitis or HIV infection: Denies History of chronic liver disease: Denies History of blood transfusion: Denies Alcohol consumption: Denies Diet Vegetarian or Vegan:  Denies Herbal medication: Denies  Reports feeling quite tired and fatigued recently.  He and his wife recently downsized to a one-story home and he has been moving a lot of furniture's by himself.  He feels it took a week for his knee to recover after the most. Denies any unintentional weight loss, fever or chills, night sweating. He is very active, walks every day. Previous occupation was a Product/process development scientist.  Drinks a cocktail every day.  Usually drinks cocktail with quinine tonic water for lower extremity cramps.  He started to do this after his ventricular tachycardic episodes about 1-1/2 years ago.  He has since discontinued EtOH and quinine.   COVID-19 positive in January 2022.  Chronic back pain-lumbar spine x-ray in January 2020 showed multilevel moderate lumbar DDD, spine spondylolisthesis, facet arthropathy.  Former smoker, quitting in 1972.  INTERVAL HISTORY: Patient returns to clinic for further evaluation and follow-up for history of chronic leukopenia, neutropenia, and thrombocytopenia.  Endorses fatigue but not bothersome.  he continues to feel well and denies any ongoing sections.  No fevers or chills.  No cough or shortness of breath.  No chest pain.  No abnormal bleeding or bruising.  Denies melena or hematochezia.  No interval falls.  No neurologic complaints.  No urinary complaints.  No other specific complaints today.  His wife accompanies him today and contributes to history.  Review of Systems  Constitutional: Negative for chills, fever, malaise/fatigue and weight loss.  HENT: Negative for sore throat.   Eyes: Negative for redness.  Respiratory: Negative for cough, shortness of breath and wheezing.   Cardiovascular: Negative for chest pain, palpitations and leg swelling.  Gastrointestinal: Negative for abdominal pain, blood in stool, nausea and vomiting.  Genitourinary: Negative for dysuria.  Musculoskeletal: Negative for myalgias.  Skin:  Negative for rash.   Neurological: Negative for dizziness, tingling and tremors.  Endo/Heme/Allergies: Does not bruise/bleed easily.  Psychiatric/Behavioral: Negative for hallucinations.    MEDICAL HISTORY:  Past Medical History:  Diagnosis Date  . Arthritis   . Basal cell carcinoma 1999   basal cell/skin  . CAD (coronary artery disease)   . Cataract Cataract surgery 2017  . GERD (gastroesophageal reflux disease)   . History of bone marrow biopsy   . History of kidney stones 2013   passed stone on his own  . Hyperlipidemia   . Hypertension   . Leukopenia   . Shingles   . Ventricular tachycardia ([place])     SURGICAL HISTORY: Past Surgical History:  Procedure Laterality Date  . CARDIAC CATHETERIZATION N/A 01/16/2016   Procedure: Left Heart Cath and Coronary Angiography;  Surgeon: [name], MD;  Location: [facility];  Service: Cardiovascular;  Laterality: N/A;  . CATARACT EXTRACTION Left 2017  . COLONOSCOPY WITH PROPOFOL N/A 03/27/2020   Procedure: COLONOSCOPY WITH PROPOFOL;  Surgeon: [name], MD;  Location: [place];  Service: Endoscopy;  Laterality: N/A;  priority 4  . CORONARY ANGIOPLASTY  2000 and 2003  . EYE SURGERY  2017   Cateract  . GANGLION CYST EXCISION  1979  . KNEE ARTHROSCOPY WITH MEDIAL MENISECTOMY Left 12/01/2017   Procedure: KNEE ARTHROSCOPY WITH MEDIAL MENISECTOMY;  Surgeon: [name], MD;  Location: [facility];  Service: Orthopedics;  Laterality: Left;  Partial  . LEFT HEART CATH Right 08/07/2017   Procedure: Left Heart Cath;  Surgeon: [name], MD;  Location: [facility];  Service: Cardiovascular;  Laterality: Right;  . PILONIDAL CYST EXCISION  1977  . POLYPECTOMY  03/27/2020   Procedure: POLYPECTOMY;  Surgeon: [name], MD;  Location: [place];  Service: Endoscopy;;  . SKIN CANCER EXCISION  1999   ear  . SYNOVECTOMY Left 12/01/2017   Procedure: SYNOVECTOMY;  Surgeon: [name], MD;  Location: [facility];   Service: Orthopedics;  Laterality: Left;  Partial  . V TACH ABLATION N/A 08/14/2017   Procedure: V Tach Ablation;  Surgeon: [name], MD;  Location: [facility];  Service: Cardiovascular;  Laterality: N/A;  . VEIN SURGERY  2000   laser    SOCIAL HISTORY: Social History   Socioeconomic History  . Marital status: Married    Spouse name: Not on file  . Number of children: 2  . Years of education: Not on file  . Highest education level: Master's degree (e.g., MA, MS, MEng, MEd, MSW, MBA)  Occupational History  . Occupation: retired  Tobacco Use  . Smoking status: Former Smoker    Packs/day: 1.00    Years: 5.00    Pack years: 5.00    Types: Cigarettes    Quit date: 06/21/1971    Years since quitting: 49.9  . Smokeless tobacco: Former Systems developer    Types: Chew    Quit date: 06/20/1980  Vaping Use  . Vaping Use: Never used  Substance and Sexual Activity  . Alcohol use: Not Currently    Alcohol/week: 0.0 standard drinks  . Drug use: No  . Sexual activity: Not Currently  Other Topics Concern  . Not on file  Social History Narrative   Lives in [place]   Retired school principal   Social Determinants of Health   Financial Resource Strain: [place]   . Difficulty of Paying Living Expenses: Not hard at all  Food  Insecurity: No Food Insecurity  . Worried About Charity fundraiser in the Last Year: Never true  . Ran Out of Food in the Last Year: Never true  Transportation Needs: No Transportation Needs  . Lack of Transportation (Medical): No  . Lack of Transportation (Non-Medical): No  Physical Activity: Insufficiently Active  . Days of Exercise per Week: 3 days  . Minutes of Exercise per Session: 40 min  Stress: No Stress Concern Present  . Feeling of Stress : Not at all  Social Connections: Not on file  Intimate Partner Violence: Not on file    FAMILY HISTORY: Family History  Problem Relation Age of Onset  . Hypertension Mother   . Heart disease Mother    . Heart attack Mother   . Diabetes Mother   . Alzheimer's disease Father   . Congestive Heart Failure Brother   . Hypertension Brother   . Other Brother        dementia  . Heart disease Brother   . Cancer Maternal Grandfather   . Brain cancer Daughter 23  . Cancer Daughter   . Cancer Maternal Uncle   . Heart disease Son     ALLERGIES:  has No Known Allergies.  MEDICATIONS:  Current Outpatient Medications  Medication Sig Dispense Refill  . acetaminophen (TYLENOL) 500 MG tablet Take 1,000 mg by mouth every 6 (six) hours as needed for moderate pain or headache.    . ALLERGY RELIEF 180 MG tablet TAKE 1 TABLET BY MOUTH EVERY DAY 90 tablet 4  . aspirin EC 81 MG tablet Take 81 mg by mouth daily.    Marland Kitchen atorvastatin (LIPITOR) 80 MG tablet Take 1 tablet (80 mg total) by mouth at bedtime. 90 tablet 4  . flecainide (TAMBOCOR) 100 MG tablet Take 2 tablets (200 mg total) as needed by mouth. As needed for recurrent tachycardia.  Do not repeat more than once per day or 3x per wk 2 tablet 3  . metoprolol succinate (TOPROL-XL) 25 MG 24 hr tablet Take 1 tablet (25 mg total) by mouth daily. 90 tablet 4  . niacin (NIASPAN) 1000 MG CR tablet TAKE 1 TABLET(1000 MG) BY MOUTH AT BEDTIME 90 tablet 4  . nitroGLYCERIN (NITROSTAT) 0.4 MG SL tablet nitroglycerin 0.4 mg subl    . pantoprazole (PROTONIX) 40 MG tablet TAKE 1 TABLET(40 MG) BY MOUTH DAILY 90 tablet 4  . ramipril (ALTACE) 10 MG capsule Take 10 mg by mouth daily.    . vitamin B-12 (CYANOCOBALAMIN) 1000 MCG tablet Take 1 tablet (1,000 mcg total) by mouth daily. 90 tablet 0   No current facility-administered medications for this visit.     PHYSICAL EXAMINATION: ECOG PERFORMANCE STATUS: 0 - Asymptomatic There were no vitals filed for this visit. There were no vitals filed for this visit.  Physical Exam Vitals reviewed: Exam limited due to telemedicine.  Constitutional:      Appearance: He is not ill-appearing.  Pulmonary:     Effort: No  respiratory distress.  Neurological:     Mental Status: He is alert.  Psychiatric:        Mood and Affect: Mood normal.        Behavior: Behavior normal.    LABORATORY DATA:  I have reviewed the data as listed Lab Results  Component Value Date   WBC 2.6 (L) 05/03/2021   HGB 13.5 05/03/2021   HCT 40.1 05/03/2021   MCV 93.7 05/03/2021   PLT 139 (L) 05/03/2021   Recent Labs  05/25/20 1015 08/03/20 1117 01/30/21 0953  NA 141 138 138  K 4.7 4.5 4.3  CL 106 104 106  CO2 _0 GLUCOSE 97 97 100*  BUN _1 CREATININE 0.99 0.90 0.83  CALCIUM 10.3* 9.6 9.9  GFRNONAA 76 >60 >60  GFRAA 88 >60  --   PROT 6.2 6.4* 6.5  ALBUMIN 4.2 3.8 3.9  AST _2 ALT _3 ALKPHOS 79 59 67  BILITOT 0.8 1.1 1.3*   Iron/TIBC/Ferritin/ %Sat No results found for: IRON, TIBC, FERRITIN, IRONPCTSAT     ASSESSMENT & PLAN: Patient is 75 year old male with chronic leukopenia and thrombocytopenia who returns to clinic for follow-up  1.  Chronic leukopenia & thrombocytopenia-predominantly neutropenia.  Previously, normal smear, normal folate level, normal B12 level, hepatitis negative, HIV negative.  Flow cytometry negative.  Multiple myeloma panel was not unremarkable except slightly increased IgM level.  Ultrasound of the abdomen revealed normal spleen size.  Normal ANA, CRP, ESR.  Discontinued EtOH and quinine containing tonic water.  Previous bone marrow biopsy on 10/26/2019 showed no significant dyspoiesis or increase in blast cells.  No significant lymphoid aggregates.  Normal cytogenetics. WBC again slightly decreased but stable; WBC 2.6, ANC 1.2.  Platelets 139,000.  Flow cytometry pending at time of dictation.  He remains asymptomatic.  Recommend continuing to monitor.  Plan: Return to clinic in 3 months for repeat labs and reevaluation with Dr. Tasia Catchings.   1. Thrombocytopenia ([place])   2. Leukopenia, unspecified type   3. Neutropenia, unspecified type ([place])     Orders Placed  This Encounter  Procedures  . CBC with Differential/Platelet    Standing Status:   Standing    Number of Occurrences:   12    Standing Expiration Date:   05/04/2022    Thank you for allowing me to participate in the care of this very pleasant patient  Beckey Rutter, [place], AGNP-C [place] [facility] [phone] (clinic) 05/04/2021    I discussed the assessment and treatment plan with the patient. The patient was provided an opportunity to ask questions and all were answered. The patient agreed with the plan and demonstrated an understanding of the instructions.   The patient was advised to call back or seek an in-person evaluation if the symptoms worsen or if the condition fails to improve as anticipated.   I spent 15 minutes face-to-face video visit time dedicated to the care of this patient on the date of this encounter to include pre-visit review of previous medical oncology notes and labs, face-to-face time with the patient, and post visit ordering of testing/documentation.

## 2021-05-04 NOTE — Progress Notes (Signed)
Patient pre screened for video visit no complaints today.

## 2021-05-07 LAB — COMP PANEL: LEUKEMIA/LYMPHOMA

## 2021-05-29 ENCOUNTER — Ambulatory Visit: Payer: Medicare PPO | Admitting: Nurse Practitioner

## 2021-05-29 ENCOUNTER — Encounter: Payer: Self-pay | Admitting: Nurse Practitioner

## 2021-05-29 ENCOUNTER — Other Ambulatory Visit: Payer: Self-pay

## 2021-05-29 VITALS — BP 122/77 | HR 64 | Temp 97.7°F | Wt 199.4 lb

## 2021-05-29 DIAGNOSIS — I472 Ventricular tachycardia, unspecified: Secondary | ICD-10-CM

## 2021-05-29 DIAGNOSIS — K2100 Gastro-esophageal reflux disease with esophagitis, without bleeding: Secondary | ICD-10-CM

## 2021-05-29 DIAGNOSIS — J3089 Other allergic rhinitis: Secondary | ICD-10-CM

## 2021-05-29 DIAGNOSIS — D696 Thrombocytopenia, unspecified: Secondary | ICD-10-CM | POA: Diagnosis not present

## 2021-05-29 DIAGNOSIS — D709 Neutropenia, unspecified: Secondary | ICD-10-CM | POA: Diagnosis not present

## 2021-05-29 DIAGNOSIS — I2510 Atherosclerotic heart disease of native coronary artery without angina pectoris: Secondary | ICD-10-CM

## 2021-05-29 DIAGNOSIS — I10 Essential (primary) hypertension: Secondary | ICD-10-CM

## 2021-05-29 DIAGNOSIS — M8588 Other specified disorders of bone density and structure, other site: Secondary | ICD-10-CM

## 2021-05-29 DIAGNOSIS — E7800 Pure hypercholesterolemia, unspecified: Secondary | ICD-10-CM

## 2021-05-29 DIAGNOSIS — I2583 Coronary atherosclerosis due to lipid rich plaque: Secondary | ICD-10-CM

## 2021-05-29 NOTE — Assessment & Plan Note (Signed)
Chronic, ongoing.  Continue current medication regimen and adjust as needed.  Mag level today.  Only uses as needed.  Return in 6 months.

## 2021-05-29 NOTE — Assessment & Plan Note (Signed)
Recent sinus issues followed by ENT and improving, continue this collaboration.

## 2021-05-29 NOTE — Assessment & Plan Note (Signed)
Chronic, ongoing.  Continue current medication regimen and adjust as needed. Lipid panel today. 

## 2021-05-29 NOTE — Assessment & Plan Note (Signed)
Chronic, stable with BP at goal in office today.  Recommend he monitor BP at least a few mornings a week at home and document.  DASH diet at home.  Continue current medication regimen and adjust as needed.  Labs today: CMP and TSH.  Return in 6 months.

## 2021-05-29 NOTE — Assessment & Plan Note (Signed)
Continue collaboration with cardiology, no recent use Tambocor.

## 2021-05-29 NOTE — Progress Notes (Signed)
BP 122/77   Pulse 64   Temp 97.7 F (36.5 C) (Oral)   Wt 199 lb 6.4 oz (90.4 kg)   SpO2 97%   BMI 27.46 kg/m    Subjective:    Patient ID: Shawn Kitchens Sr., male    DOB: [birth year], 75 y.o.   MRN: [id number]  HPI: Shawn COBERN Sr. is a 75 y.o. male  Chief Complaint  Patient presents with   Hyperlipidemia   Hypertension   Gastroesophageal Reflux   Osteopenia   Sinus Problem    Patient states he is coming off of a 10 day ago sinus infection and had 4 nosebleeds and states he saw his ENT and was prescribed Clindamycin and states he is half way through the prescription and states he feels better and have not had any nose bleeds since he took gauze from out of his nose.    Recently saw Dr. Tami Ribas with ENT for sinus issues and cauterized and packed. Started on Clindamycin and reports overall feeling 90% better.    HYPERTENSION / HYPERLIPIDEMIA Continues on Ramipril, Metoprolol, Tambocor as needed, Niacin, and Atorvastatin.  Sees cardiology every 6 months -- sees Dr. Chancy Milroy -- returns to see in a couple weeks.  Has two blockages they monitor.  Had ablation 3 years ago for VT.   Satisfied with current treatment? yes Duration of hypertension: chronic BP monitoring frequency: not checking BP range: BP medication side effects: no Duration of hyperlipidemia: chronic Cholesterol medication side effects: no Cholesterol supplements: none Medication compliance: good compliance Aspirin: yes Recent stressors: no Recurrent headaches: no Visual changes: no Palpitations: no Dyspnea: no Chest pain: no Lower extremity edema: to right leg at end of day only -- baseline Dizzy/lightheaded: no    LEUKOPENIA AND THROMBOCYTOPENIA: Noted on labs since 2018, followed by oncology and last saw 05/04/2021.  Returns every 6 months.  No fevers, night sweats, unexpected weight loss, decrease appetite. WBC currently 2.6 as of 05/03/21.     GERD Continues on Protonix as needed. GERD control  status: stable  Satisfied with current treatment? yes Heartburn frequency: none Medication side effects: no  Medication compliance: stable Dysphagia: no Odynophagia:  no Hematemesis: no Blood in stool: no EGD: no   OSTEOPENIA Taking Vitamin D -- osteopenia noted in 2018 -- T Score -2.2. Satisfied with current treatment?: yes Adequate calcium & vitamin D: yes Weight bearing exercises: yes   Relevant past medical, surgical, family and social history reviewed and updated as indicated. Interim medical history since our last visit reviewed. Allergies and medications reviewed and updated.  Review of Systems  Constitutional:  Negative for activity change, diaphoresis, fatigue and fever.  Respiratory:  Negative for cough, chest tightness, shortness of breath and wheezing.   Cardiovascular:  Negative for chest pain, palpitations and leg swelling.  Gastrointestinal: Negative.   Endocrine: Negative.   Neurological: Negative.   Psychiatric/Behavioral: Negative.     Per HPI unless specifically indicated above     Objective:    BP 122/77   Pulse 64   Temp 97.7 F (36.5 C) (Oral)   Wt 199 lb 6.4 oz (90.4 kg)   SpO2 97%   BMI 27.46 kg/m   Wt Readings from Last 3 Encounters:  05/29/21 199 lb 6.4 oz (90.4 kg)  01/30/21 198 lb 9.6 oz (90.1 kg)  11/28/20 204 lb 3.2 oz (92.6 kg)    Physical Exam Vitals and nursing note reviewed.  Constitutional:      General: He is awake. He  is not in acute distress.    Appearance: He is well-developed and well-groomed. He is not ill-appearing or toxic-appearing.  HENT:     Head: Normocephalic and atraumatic.     Right Ear: Hearing normal. No drainage.     Left Ear: Hearing normal. No drainage.  Eyes:     General: Lids are normal.        Right eye: No discharge.        Left eye: No discharge.     Conjunctiva/sclera: Conjunctivae normal.     Pupils: Pupils are equal, round, and reactive to light.  Neck:     Thyroid: No thyromegaly.      Vascular: No carotid bruit.  Cardiovascular:     Rate and Rhythm: Normal rate and regular rhythm.     Heart sounds: Normal heart sounds, S1 normal and S2 normal. No murmur heard.   No gallop.  Pulmonary:     Effort: Pulmonary effort is normal. No accessory muscle usage or respiratory distress.     Breath sounds: Normal breath sounds.  Abdominal:     General: Bowel sounds are normal.     Palpations: Abdomen is soft. There is no hepatomegaly or splenomegaly.  Musculoskeletal:        General: Normal range of motion.     Cervical back: Normal range of motion and neck supple.     Right lower leg: No edema.     Left lower leg: No edema.  Skin:    General: Skin is warm and dry.     Capillary Refill: Capillary refill takes less than 2 seconds.     Findings: No rash.  Neurological:     Mental Status: He is alert and oriented to person, place, and time.     Deep Tendon Reflexes: Reflexes are normal and symmetric.  Psychiatric:        Attention and Perception: Attention normal.        Mood and Affect: Mood normal.        Speech: Speech normal.        Behavior: Behavior normal. Behavior is cooperative.        Thought Content: Thought content normal.    Results for orders placed or performed in visit on 05/03/21  Flow cytometry panel-leukemia/lymphoma work-up  Result Value Ref Range   PATH INTERP XXX-IMP Comment    ANNOTATION COMMENT IMP Comment    CLINICAL INFO Comment    Specimen Type Comment    ASSESSMENT OF LEUKOCYTES Comment    % Viable Cells Comment    Immunophenotypic Profile Comment    ANALYSIS AND GATING STRATEGY Comment    IMMUNOPHENOTYPING STUDY Comment    PATHOLOGIST NAME Comment    COMMENT: Comment   CBC with Differential/Platelet  Result Value Ref Range   WBC 2.6 (L) 4.0 - 10.5 K/uL   RBC 4.28 4.22 - 5.81 MIL/uL   Hemoglobin 13.5 13.0 - 17.0 g/dL   HCT 40.1 39.0 - 52.0 %   MCV 93.7 80.0 - 100.0 fL   MCH 31.5 26.0 - 34.0 pg   MCHC 33.7 30.0 - 36.0 g/dL   RDW 13.8  11.5 - 15.5 %   Platelets 139 (L) 150 - 400 K/uL   nRBC 0.0 0.0 - 0.2 %   Neutrophils Relative % 44 %   Neutro Abs 1.2 (L) 1.7 - 7.7 K/uL   Lymphocytes Relative 31 %   Lymphs Abs 0.8 0.7 - 4.0 K/uL   Monocytes Relative 17 %  Monocytes Absolute 0.4 0.1 - 1.0 K/uL   Eosinophils Relative 7 %   Eosinophils Absolute 0.2 0.0 - 0.5 K/uL   Basophils Relative 1 %   Basophils Absolute 0.0 0.0 - 0.1 K/uL   Immature Granulocytes 0 %   Abs Immature Granulocytes 0.01 0.00 - 0.07 K/uL      Assessment & Plan:   Problem List Items Addressed This Visit       Cardiovascular and Mediastinum   CAD (coronary artery disease)    Chronic, stable.  Continue current medication regimen and collaboration with cardiology.  No recent NTG use.       Essential hypertension    Chronic, stable with BP at goal in office today.  Recommend he monitor BP at least a few mornings a week at home and document.  DASH diet at home.  Continue current medication regimen and adjust as needed.  Labs today: CMP and TSH.  Return in 6 months.        Relevant Orders   Comprehensive metabolic panel   TSH   Ventricular tachycardia ([place])    Continue collaboration with cardiology, no recent use Tambocor.         Respiratory   Allergic rhinitis    Recent sinus issues followed by ENT and improving, continue this collaboration.         Digestive   Reflux esophagitis    Chronic, ongoing.  Continue current medication regimen and adjust as needed.  Mag level today.  Only uses as needed.  Return in 6 months.       Relevant Orders   Magnesium     Musculoskeletal and Integument   Osteopenia    Chronic, ongoing.  Recommend continue Vitamin D supplement and calcium daily.  Educated on this.  Obtain Vit D level today and plan on repeat DEXA in 2023.       Relevant Orders   VITAMIN D 25 Hydroxy (Vit-D Deficiency, Fractures)     Other   Hyperlipidemia    Chronic, ongoing.  Continue current medication regimen and  adjust as needed.  Lipid panel today.          Relevant Orders   Comprehensive metabolic panel   Lipid Panel w/o Chol/HDL Ratio   Leukopenia    Continue collaboration with hematology, recent note and labs reviewed.       Thrombocytopenia ([place]) - Primary    Continue collaboration with hematology, recent labs and note reviewed.         Follow up plan: Return in about 6 months (around 11/29/2021) for Annual physical.

## 2021-05-29 NOTE — Assessment & Plan Note (Signed)
Continue collaboration with hematology, recent note and labs reviewed.

## 2021-05-29 NOTE — Assessment & Plan Note (Addendum)
Continue collaboration with hematology, recent labs and note reviewed.

## 2021-05-29 NOTE — Assessment & Plan Note (Signed)
Chronic, stable.  Continue current medication regimen and collaboration with cardiology.  No recent NTG use.

## 2021-05-29 NOTE — Assessment & Plan Note (Signed)
Chronic, ongoing.  Recommend continue Vitamin D supplement and calcium daily.  Educated on this.  Obtain Vit D level today and plan on repeat DEXA in 2023.

## 2021-05-29 NOTE — Patient Instructions (Signed)
Preventing High Cholesterol Cholesterol is a white, waxy substance similar to fat that the human body needs to help build cells. The liver makes all the cholesterol that a person's body needs. Having high cholesterol (hypercholesterolemia) increases your risk for heart disease and stroke. Extra or excess cholesterolcomes from the food that you eat. High cholesterol can often be prevented with diet and lifestyle changes. If you already have high cholesterol, you can control it with diet, lifestyle changes,and medicines. How can high cholesterol affect me? If you have high cholesterol, fatty deposits (plaques) may build up on the walls of your blood vessels. The blood vessels that carry blood away from your heart are called arteries. Plaques make the arteries narrower and stiffer. This in turn can: Restrict or block blood flow and cause blood clots to form. Increase your risk for heart attack and stroke. What can increase my risk for high cholesterol? This condition is more likely to develop in people who: Eat foods that are high in saturated fat or cholesterol. Saturated fat is mostly found in foods that come from animal sources. Are overweight. Are not getting enough exercise. Have a family history of high cholesterol (familial hypercholesterolemia). What actions can I take to prevent this? Nutrition  Eat less saturated fat. Avoid trans fats (partially hydrogenated oils). These are often found in margarine and in some baked goods, fried foods, and snacks bought in packages. Avoid precooked or cured meat, such as bacon, sausages, or meat loaves. Avoid foods and drinks that have added sugars. Eat more fruits, vegetables, and whole grains. Choose healthy sources of protein, such as fish, poultry, lean cuts of red meat, beans, peas, lentils, and nuts. Choose healthy sources of fat, such as: Nuts. Vegetable oils, especially olive oil. Fish that have healthy fats, such as omega-3 fatty acids.  These fish include mackerel or salmon.  Lifestyle Lose weight if you are overweight. Maintaining a healthy body mass index (BMI) can help prevent or control high cholesterol. It can also lower your risk for diabetes and high blood pressure. Ask your health care provider to help you with a diet and exercise plan to lose weight safely. Do not use any products that contain nicotine or tobacco, such as cigarettes, e-cigarettes, and chewing tobacco. If you need help quitting, ask your health care provider. Alcohol use Do not drink alcohol if: Your health care provider tells you not to drink. You are pregnant, may be pregnant, or are planning to become pregnant. If you drink alcohol: Limit how much you use to: 0-1 drink a day for women. 0-2 drinks a day for men. Be aware of how much alcohol is in your drink. In the [place] one drink equals one 12 oz bottle of beer (355 mL), one 5 oz glass of wine (148 mL), or one 1 oz glass of hard liquor (44 mL). Activity  Get enough exercise. Do exercises as told by your health care provider. Each week, do at least 150 minutes of exercise that takes a medium level of effort (moderate-intensity exercise). This kind of exercise: Makes your heart beat faster while allowing you to still be able to talk. Can be done in short sessions several times a day or longer sessions a few times a week. For example, on 5 days each week, you could walk fast or ride your bike 3 times a day for 10 minutes each time.  Medicines Your health care provider may recommend medicines to help lower cholesterol. This may be a medicine to lower  the amount of cholesterol that your liver makes. You may need medicine if: Diet and lifestyle changes have not lowered your cholesterol enough. You have high cholesterol and other risk factors for heart disease or stroke. Take over-the-counter and prescription medicines only as told by your health care provider. General information Manage your risk  factors for high cholesterol. Talk with your health care provider about all your risk factors and how to lower your risk. Manage other conditions that you have, such as diabetes or high blood pressure (hypertension). Have blood tests to check your cholesterol levels at regular points in time as told by your health care provider. Keep all follow-up visits as told by your health care provider. This is important. Where to find more information American Heart Association: www.heart.org National Heart, Lung, and Blood Institute: https://wilson-eaton.com/ Summary High cholesterol increases your risk for heart disease and stroke. By keeping your cholesterol level low, you can reduce your risk for these conditions. High cholesterol can often be prevented with diet and lifestyle changes. Work with your health care provider to manage your risk factors, and have your blood tested regularly. This information is not intended to replace advice given to you by your health care provider. Make sure you discuss any questions you have with your healthcare provider. Document Revised: 08/31/2019 Document Reviewed: 08/31/2019 Elsevier Patient Education  Shawn Shepard.

## 2021-05-30 LAB — TSH: TSH: 0.914 u[IU]/mL (ref 0.450–4.500)

## 2021-05-30 LAB — COMPREHENSIVE METABOLIC PANEL
ALT: 19 IU/L (ref 0–44)
AST: 27 IU/L (ref 0–40)
Albumin/Globulin Ratio: 1.7 (ref 1.2–2.2)
Albumin: 4 g/dL (ref 3.7–4.7)
Alkaline Phosphatase: 96 IU/L (ref 44–121)
BUN/Creatinine Ratio: 11 (ref 10–24)
BUN: 9 mg/dL (ref 8–27)
Bilirubin Total: 0.5 mg/dL (ref 0.0–1.2)
CO2: 23 mmol/L (ref 20–29)
Calcium: 10.2 mg/dL (ref 8.6–10.2)
Chloride: 101 mmol/L (ref 96–106)
Creatinine, Ser: 0.85 mg/dL (ref 0.76–1.27)
Globulin, Total: 2.4 g/dL (ref 1.5–4.5)
Glucose: 88 mg/dL (ref 65–99)
Potassium: 4.8 mmol/L (ref 3.5–5.2)
Sodium: 136 mmol/L (ref 134–144)
Total Protein: 6.4 g/dL (ref 6.0–8.5)
eGFR: 92 mL/min/{1.73_m2} (ref >59)

## 2021-05-30 LAB — LIPID PANEL W/O CHOL/HDL RATIO
Cholesterol, Total: 124 mg/dL (ref 100–199)
HDL: 51 mg/dL (ref >39)
LDL Chol Calc (NIH): 60 mg/dL (ref 0–99)
Triglycerides: 63 mg/dL (ref 0–149)
VLDL Cholesterol Cal: 13 mg/dL (ref 5–40)

## 2021-05-30 LAB — MAGNESIUM: Magnesium: 2.2 mg/dL (ref 1.6–2.3)

## 2021-05-30 LAB — VITAMIN D 25 HYDROXY (VIT D DEFICIENCY, FRACTURES): Vit D, 25-Hydroxy: 57 ng/mL (ref 30.0–100.0)

## 2021-05-30 NOTE — Progress Notes (Signed)
Contacted via MyChart   Good morning Shawn Shepard, your labs have returned and WOW, overall fantastic levels.  Continue all current medications and keep up to the good work!!  Any questions? Keep being awesome!!  Thank you for allowing me to participate in your care.  I appreciate you. Kindest regards, Tekila Caillouet

## 2021-08-08 ENCOUNTER — Other Ambulatory Visit: Payer: Medicare PPO

## 2021-08-08 ENCOUNTER — Ambulatory Visit: Payer: Medicare PPO | Admitting: Oncology

## 2021-08-09 ENCOUNTER — Telehealth: Payer: Medicare PPO | Admitting: Nurse Practitioner

## 2021-08-09 ENCOUNTER — Encounter: Payer: Self-pay | Admitting: Nurse Practitioner

## 2021-08-09 VITALS — Wt 193.0 lb

## 2021-08-09 DIAGNOSIS — U071 COVID-19: Secondary | ICD-10-CM | POA: Diagnosis not present

## 2021-08-09 MED ORDER — PREDNISONE 10 MG PO TABS: 10.0000 mg | ORAL_TABLET | Freq: Every day | ORAL | 0 refills | Status: DC

## 2021-08-09 MED ORDER — BENZONATATE 200 MG PO CAPS: 200.0000 mg | ORAL_CAPSULE | Freq: Two times a day (BID) | ORAL | 0 refills | Status: DC | PRN

## 2021-08-09 NOTE — Progress Notes (Signed)
Wt 193 lb (87.5 kg)   BMI 26.57 kg/m    Subjective:    Patient ID: Shawn Kitchens Sr., male    DOB: 07/09/1946, 75 y.o.   MRN: [id number]  HPI: Shawn PERNELL Sr. is a 75 y.o. male  Chief Complaint  Patient presents with   Covid Positive    Pt states he tested positive for Covid on 08/04/21. States symptoms started this day as well. States he has bad bronchitis like symptoms- cough and congestion. States he had body aches when he woke up on Sunday morning. States he also does not have any energy as well.    COVID Patient states his symptoms started on 08/04/21.  He is experiencing congestion, coughing, body aches, fatigue.  Denies SOB, wheezing, chest pain, chest tightness and fever.    Relevant past medical, surgical, family and social history reviewed and updated as indicated. Interim medical history since our last visit reviewed. Allergies and medications reviewed and updated.  Review of Systems  Constitutional:  Positive for fatigue. Negative for fever.  HENT:  Positive for congestion.   Respiratory:  Negative for chest tightness, shortness of breath and wheezing.   Cardiovascular:  Negative for chest pain.   Per HPI unless specifically indicated above     Objective:    Wt 193 lb (87.5 kg)   BMI 26.57 kg/m   Wt Readings from Last 3 Encounters:  08/09/21 193 lb (87.5 kg)  05/29/21 199 lb 6.4 oz (90.4 kg)  01/30/21 198 lb 9.6 oz (90.1 kg)    Physical Exam Vitals and nursing note reviewed.  Constitutional:      General: He is not in acute distress.    Appearance: He is not ill-appearing.  HENT:     Head: Normocephalic.     Right Ear: Hearing normal.     Left Ear: Hearing normal.     Nose: Nose normal.  Pulmonary:     Effort: Pulmonary effort is normal. No respiratory distress.  Neurological:     Mental Status: He is alert.  Psychiatric:        Mood and Affect: Mood normal.        Behavior: Behavior normal.        Thought Content: Thought content normal.         Judgment: Judgment normal.    Results for orders placed or performed in visit on 05/29/21  Lipid Panel w/o Chol/HDL Ratio  Result Value Ref Range   Cholesterol, Total 124 100 - 199 mg/dL   Triglycerides 63 0 - 149 mg/dL   HDL 51 >39 mg/dL   VLDL Cholesterol Cal 13 5 - 40 mg/dL   LDL Chol Calc (NIH) 60 0 - 99 mg/dL  TSH  Result Value Ref Range   TSH 0.914 0.450 - 4.500 uIU/mL  VITAMIN D 25 Hydroxy (Vit-D Deficiency, Fractures)  Result Value Ref Range   Vit D, 25-Hydroxy 57.0 30.0 - 100.0 ng/mL  Magnesium  Result Value Ref Range   Magnesium 2.2 1.6 - 2.3 mg/dL  Comprehensive metabolic panel  Result Value Ref Range   Glucose 88 65 - 99 mg/dL   BUN 9 8 - 27 mg/dL   Creatinine, Ser 0.85 0.76 - 1.27 mg/dL   eGFR 92 >59 mL/min/1.73   BUN/Creatinine Ratio 11 10 - 24   Sodium 136 134 - 144 mmol/L   Potassium 4.8 3.5 - 5.2 mmol/L   Chloride 101 96 - 106 mmol/L   CO2 23 20 - 29  mmol/L   Calcium 10.2 8.6 - 10.2 mg/dL   Total Protein 6.4 6.0 - 8.5 g/dL   Albumin 4.0 3.7 - 4.7 g/dL   Globulin, Total 2.4 1.5 - 4.5 g/dL   Albumin/Globulin Ratio 1.7 1.2 - 2.2   Bilirubin Total 0.5 0.0 - 1.2 mg/dL   Alkaline Phosphatase 96 44 - 121 IU/L   AST 27 0 - 40 IU/L   ALT 19 0 - 44 IU/L      Assessment & Plan:   Problem List Items Addressed This Visit   None Visit Diagnoses     COVID-19    -  Primary   Prednisone sent for patient today. Outside of antiviral range. Discussed s/s to monitor for and when to seek higher level of care. Discussed symptomatic mngt.        Follow up plan: Return if symptoms worsen or fail to improve.   This visit was completed via MyChart due to the restrictions of the COVID-19 pandemic. All issues as above were discussed and addressed. Physical exam was done as above through visual confirmation on MyChart. If it was felt that the patient should be evaluated in the office, they were directed there. The patient verbally consented to this visit. Location  of the patient: Home Location of the provider: Office Those involved with this call:  Provider: Jon Billings, NP CMA: Yvonna Alanis, [place] Desk/Registration: Myrlene Broker This encounter was conducted via video.  I spent 15 dedicated to the care of this patient on the date of this encounter to include previsit review of 20, face to face time with the patient, and post visit ordering of testing.

## 2021-08-13 ENCOUNTER — Inpatient Hospital Stay: Payer: Medicare PPO

## 2021-08-13 ENCOUNTER — Inpatient Hospital Stay: Payer: Medicare PPO | Admitting: Oncology

## 2021-09-06 ENCOUNTER — Other Ambulatory Visit: Payer: Medicare PPO

## 2021-09-06 ENCOUNTER — Ambulatory Visit: Payer: Medicare PPO | Admitting: Oncology

## 2021-09-18 ENCOUNTER — Inpatient Hospital Stay (HOSPITAL_BASED_OUTPATIENT_CLINIC_OR_DEPARTMENT_OTHER): Payer: Medicare PPO | Admitting: Oncology

## 2021-09-18 ENCOUNTER — Other Ambulatory Visit: Payer: Self-pay

## 2021-09-18 ENCOUNTER — Inpatient Hospital Stay: Payer: Medicare PPO | Attending: Oncology

## 2021-09-18 ENCOUNTER — Encounter: Payer: Self-pay | Admitting: Oncology

## 2021-09-18 VITALS — BP 123/72 | HR 59 | Temp 98.2°F | Wt 203.1 lb

## 2021-09-18 DIAGNOSIS — D709 Neutropenia, unspecified: Secondary | ICD-10-CM

## 2021-09-18 DIAGNOSIS — D72820 Lymphocytosis (symptomatic): Secondary | ICD-10-CM

## 2021-09-18 DIAGNOSIS — D696 Thrombocytopenia, unspecified: Secondary | ICD-10-CM

## 2021-09-18 DIAGNOSIS — D72819 Decreased white blood cell count, unspecified: Secondary | ICD-10-CM

## 2021-09-18 LAB — CBC WITH DIFFERENTIAL/PLATELET
Abs Immature Granulocytes: 0.01 10*3/uL (ref 0.00–0.07)
Basophils Absolute: 0 10*3/uL (ref 0.0–0.1)
Basophils Relative: 1 %
Eosinophils Absolute: 0.2 10*3/uL (ref 0.0–0.5)
Eosinophils Relative: 6 %
HCT: 38.5 % — ABNORMAL LOW (ref 39.0–52.0)
Hemoglobin: 12.6 g/dL — ABNORMAL LOW (ref 13.0–17.0)
Immature Granulocytes: 0 %
Lymphocytes Relative: 29 %
Lymphs Abs: 0.8 10*3/uL (ref 0.7–4.0)
MCH: 31.3 pg (ref 26.0–34.0)
MCHC: 32.7 g/dL (ref 30.0–36.0)
MCV: 95.5 fL (ref 80.0–100.0)
Monocytes Absolute: 0.4 10*3/uL (ref 0.1–1.0)
Monocytes Relative: 16 %
Neutro Abs: 1.3 10*3/uL — ABNORMAL LOW (ref 1.7–7.7)
Neutrophils Relative %: 48 %
Platelets: 151 10*3/uL (ref 150–400)
RBC: 4.03 MIL/uL — ABNORMAL LOW (ref 4.22–5.81)
RDW: 14.2 % (ref 11.5–15.5)
WBC: 2.8 10*3/uL — ABNORMAL LOW (ref 4.0–10.5)
nRBC: 0 % (ref 0.0–0.2)

## 2021-09-18 NOTE — Progress Notes (Signed)
Hematology/Oncology follow-up note [facility] Telephone:([phone]) [phone] [phone]) [phone]   Patient Care Team: Venita Lick, NP as PCP - General (Nurse Practitioner) [name], MD as Consulting Physician (Cardiology) [name], MD as Consulting Physician (Oncology)  REFERRING PROVIDER: Venita Lick, NP  CHIEF COMPLAINTS/REASON FOR VISIT:  Follow-up for management of leukopenia and thrombocytopenia,   HISTORY OF PRESENTING ILLNESS:  Shawn Kitchens Sr. is a  75 y.o.  male with PMH listed below who was referred to me for evaluation of leukopenia. Reviewed patient's recent labs. Patient has low total WBC count was 2.6, predominantly absolute lymphocyte 0.6. Earlier lab lab done 11/2019 showed WBC 2.8, lymphocyte 0.7, neutrophil 1.4.  Previous lab records reviewed. 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 No aggravating or improving factors.  Associated symptoms:  denies fatigue, weight loss, fever, chills, frequent infection.  History hepatitis or HIV infection: Denies History of chronic liver disease: Denies History of blood transfusion: Denies Alcohol consumption: Denies Diet Vegetarian or Vegan: Denies Herbal medication: Denies  Reports feeling quite tired and fatigued recently.  He and his wife recently downsized to a one-story home and he has been moving a lot of furniture's by himself.  He feels it took a week for his knee to recover after the most. Denies any unintentional weight loss, fever or chills, night sweating. He is very active, walks every day. Previous occupation was a Product/process development scientist.  Drinks a cocktail every day.  Usually drinks cocktail with quinine tonic water for lower extremity cramps.  He started to do this after his ventricular tachycardic episodes about 1-1/2 years ago. Former smoker, quitting in 1972.   chronic back pain and had a lumbar spine x-ray done on 12/20/2020 which showed  multilevel moderate lumbar degenerative disc disease, spine spondylolisthesis, facet arthropathy  INTERVAL HISTORY Shawn Quest Handy Sr. is a 75 y.o. male who has above history reviewed by me today presents for follow up visit for management of leukopenia and thrombocytopenia. Problems and complaints are listed below: Patient reports feeling well Patient had COVID 19 infection recently and was treated with a course of prednisone. Symptom has improved. He denies any constitutional symptoms.  Review of Systems  Constitutional:  Negative for chills, fever, malaise/fatigue and weight loss.  HENT:  Negative for sore throat.   Eyes:  Negative for redness.  Respiratory:  Negative for cough, shortness of breath and wheezing.   Cardiovascular:  Negative for chest pain, palpitations and leg swelling.  Gastrointestinal:  Negative for abdominal pain, blood in stool, nausea and vomiting.  Genitourinary:  Negative for dysuria.  Musculoskeletal:  Negative for myalgias.  Skin:  Negative for rash.  Neurological:  Negative for dizziness, tingling and tremors.  Endo/Heme/Allergies:  Does not bruise/bleed easily.  Psychiatric/Behavioral:  Negative for hallucinations.    MEDICAL HISTORY:  Past Medical History:  Diagnosis Date   Arthritis    Basal cell carcinoma 1999   basal cell/skin   CAD (coronary artery disease)    Cataract Cataract surgery 2017   GERD (gastroesophageal reflux disease)    History of bone marrow biopsy    History of kidney stones 2013   passed stone on his own   Hyperlipidemia    Hypertension    Leukopenia    Shingles    Ventricular tachycardia     SURGICAL HISTORY: Past Surgical History:  Procedure Laterality Date   CARDIAC CATHETERIZATION N/A 01/16/2016   Procedure: Left Heart Cath and Coronary Angiography;  Surgeon:  [name], MD;  Location: [facility];  Service: Cardiovascular;  Laterality: N/A;   CATARACT EXTRACTION Left 2017   COLONOSCOPY WITH  PROPOFOL N/A 03/27/2020   Procedure: COLONOSCOPY WITH PROPOFOL;  Surgeon: [name], MD;  Location: [facility];  Service: Endoscopy;  Laterality: N/A;  priority 4   CORONARY ANGIOPLASTY  2000 and 2003   EYE SURGERY  2017   Cateract   GANGLION CYST EXCISION  1979   HAND SURGERY Right 02/08/2021   KNEE ARTHROSCOPY WITH MEDIAL MENISECTOMY Left 12/01/2017   Procedure: KNEE ARTHROSCOPY WITH MEDIAL MENISECTOMY;  Surgeon: [name], MD;  Location: [facility];  Service: Orthopedics;  Laterality: Left;  Partial   LEFT HEART CATH Right 08/07/2017   Procedure: Left Heart Cath;  Surgeon: [name], MD;  Location: [facility];  Service: Cardiovascular;  Laterality: Right;   PILONIDAL CYST EXCISION  1977   POLYPECTOMY  03/27/2020   Procedure: POLYPECTOMY;  Surgeon: [name], MD;  Location: [facility];  Service: Endoscopy;;   SKIN CANCER EXCISION  1999   ear   SYNOVECTOMY Left 12/01/2017   Procedure: SYNOVECTOMY;  Surgeon: [name], MD;  Location: [facility];  Service: Orthopedics;  Laterality: Left;  Partial   V TACH ABLATION N/A 08/14/2017   Procedure: V Tach Ablation;  Surgeon: [name], MD;  Location: [facility];  Service: Cardiovascular;  Laterality: N/A;   VEIN SURGERY  2000   laser    SOCIAL HISTORY: Social History   Socioeconomic History   Marital status: Married    Spouse name: Not on file   Number of children: 2   Years of education: Not on file   Highest education level: Master's degree (e.g., MA, MS, MEng, MEd, MSW, MBA)  Occupational History   Occupation: retired  Tobacco Use   Smoking status: Former    Packs/day: 1.00    Years: 5.00    Pack years: 5.00    Types: Cigarettes    Quit date: 06/21/1971    Years since quitting: 50.2   Smokeless tobacco: Former    Types: Chew    Quit date: 06/20/1980  Vaping Use   Vaping Use: Never used  Substance and Sexual Activity   Alcohol use: Not Currently     Alcohol/week: 0.0 standard drinks   Drug use: No   Sexual activity: Not Currently  Other Topics Concern   Not on file  Social History Narrative   Lives in [place]   Retired school principal   Social Determinants of Health   Financial Resource Strain: Low Risk    Difficulty of Paying Living Expenses: Not hard at all  Food Insecurity: No Food Insecurity   Worried About Charity fundraiser in the Last Year: Never true   Arboriculturist in the Last Year: Never true  Transportation Needs: No Transportation Needs   Lack of Transportation (Medical): No   Lack of Transportation (Non-Medical): No  Physical Activity: Insufficiently Active   Days of Exercise per Week: 3 days   Minutes of Exercise per Session: 40 min  Stress: No Stress Concern Present   Feeling of Stress : Not at all  Social Connections: Not on file  Intimate Partner Violence: Not on file    FAMILY HISTORY: Family History  Problem Relation Age of Onset   Hypertension Mother    Heart disease Mother    Heart attack Mother    Diabetes Mother  Alzheimer's disease Father    Alzheimer's disease Brother    Congestive Heart Failure Brother    Hypertension Brother    Other Brother        dementia   Heart disease Brother    Brain cancer Daughter 55   Cancer Daughter    Heart disease Son    Cancer Maternal Uncle    Cancer Maternal Grandfather     ALLERGIES:  has No Known Allergies.  MEDICATIONS:  Current Outpatient Medications  Medication Sig Dispense Refill   acetaminophen (TYLENOL) 500 MG tablet Take 1,000 mg by mouth every 6 (six) hours as needed for moderate pain or headache.     ALLERGY RELIEF 180 MG tablet TAKE 1 TABLET BY MOUTH EVERY DAY 90 tablet 4   aspirin EC 81 MG tablet Take 81 mg by mouth daily.     atorvastatin (LIPITOR) 80 MG tablet Take 1 tablet (80 mg total) by mouth at bedtime. 90 tablet 4   benzonatate (TESSALON) 200 MG capsule Take 1 capsule (200 mg total) by mouth 2 (two) times daily as  needed for cough. 20 capsule 0   Cholecalciferol (VITAMIN D3) 50 MCG (2000 UT) CHEW Chew by mouth.     flecainide (TAMBOCOR) 100 MG tablet Take 2 tablets (200 mg total) as needed by mouth. As needed for recurrent tachycardia.  Do not repeat more than once per day or 3x per wk 2 tablet 3   metoprolol succinate (TOPROL-XL) 25 MG 24 hr tablet Take 1 tablet (25 mg total) by mouth daily. 90 tablet 4   niacin (NIASPAN) 1000 MG CR tablet TAKE 1 TABLET(1000 MG) BY MOUTH AT BEDTIME 90 tablet 4   nitroGLYCERIN (NITROSTAT) 0.4 MG SL tablet nitroglycerin 0.4 mg subl     pantoprazole (PROTONIX) 40 MG tablet TAKE 1 TABLET(40 MG) BY MOUTH DAILY 90 tablet 4   ramipril (ALTACE) 10 MG capsule Take 10 mg by mouth daily.     vitamin B-12 (CYANOCOBALAMIN) 1000 MCG tablet Take 1 tablet (1,000 mcg total) by mouth daily. 90 tablet 0   predniSONE (DELTASONE) 10 MG tablet Take 1 tablet (10 mg total) by mouth daily with breakfast. 21 tablet 0   No current facility-administered medications for this visit.     PHYSICAL EXAMINATION: ECOG PERFORMANCE STATUS: 0 - Asymptomatic Vitals:   09/18/21 1431  BP: 123/72  Pulse: (!) 59  Temp: 98.2 F (36.8 C)   Filed Weights   09/18/21 1431  Weight: 203 lb 1.6 oz (92.1 kg)    Physical Exam Constitutional:      General: He is not in acute distress. HENT:     Head: Normocephalic and atraumatic.  Eyes:     General: No scleral icterus.    Pupils: Pupils are equal, round, and reactive to light.  Cardiovascular:     Rate and Rhythm: Normal rate and regular rhythm.     Heart sounds: Normal heart sounds.  Pulmonary:     Effort: Pulmonary effort is normal. No respiratory distress.     Breath sounds: No wheezing.  Abdominal:     General: Bowel sounds are normal. There is no distension.     Palpations: Abdomen is soft. There is no mass.     Tenderness: There is no abdominal tenderness.  Musculoskeletal:        General: No deformity. Normal range of motion.     Cervical  back: Normal range of motion and neck supple.  Skin:    General: Skin is warm and  dry.     Findings: No erythema or rash.  Neurological:     Mental Status: He is alert and oriented to person, place, and time.     Cranial Nerves: No cranial nerve deficit.     Coordination: Coordination normal.  Psychiatric:        Behavior: Behavior normal.        Thought Content: Thought content normal.     LABORATORY DATA:  I have reviewed the data as listed Lab Results  Component Value Date   WBC 2.8 (L) 09/18/2021   HGB 12.6 (L) 09/18/2021   HCT 38.5 (L) 09/18/2021   MCV 95.5 09/18/2021   PLT 151 09/18/2021   Recent Labs    01/30/21 0953 05/29/21 1124  NA 138 136  K 4.3 4.8  CL 106 101  CO2 24 23  GLUCOSE 100* 88  BUN 14 9  CREATININE 0.83 0.85  CALCIUM 9.9 10.2  GFRNONAA >60  --   PROT 6.5 6.4  ALBUMIN 3.9 4.0  AST 24 27  ALT 22 19  ALKPHOS 67 96  BILITOT 1.3* 0.5    Iron/TIBC/Ferritin/ %Sat No results found for: IRON, TIBC, FERRITIN, IRONPCTSAT     ASSESSMENT & PLAN:  1. Thrombocytopenia ([place])   2. 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 10/26/2019 bone marrow biopsy showed no significant dyspoiesis or increase in blast cells.  No significant lymphoid aggregates.  Normal cytogenetics. Labs reviewed and discussed with patient Stable neutropenia with ANC of 1.3.  Continue observation   #Thrombocytopenia, resolved today.  Probably due to recent prednisone use/infection. 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  No splenomegaly on physical examination.  Continue monitor Repeat blood work in 6 months.   #Monoclonal lymphocytosis of unknown significance.  White count is stable.  Patient is asymptomatic.  We will continue monitor.  Follow-up in 6 months.  Orders Placed This Encounter  Procedures   Technologist smear review     Standing Status:   Future    Standing Expiration Date:   09/18/2022   CBC with Differential/Platelet    Standing Status:   Future    Standing Expiration Date:   09/18/2022   Comprehensive metabolic panel    Standing Status:   Future    Standing Expiration Date:   09/18/2022    We spent sufficient time to discuss many aspect of care, questions were answered to patient's satisfaction. [name], MD, PhD  09/18/2021

## 2021-10-01 ENCOUNTER — Telehealth: Payer: Self-pay | Admitting: Nurse Practitioner

## 2021-10-01 NOTE — Telephone Encounter (Signed)
Copied from [place] [id number]. Topic: Appointment Scheduling - Scheduling Inquiry for Clinic >> Oct 01, 2021  3:19 PM Yvette Rack wrote: Reason for CRM: Pt stated he will be out of town so he will need to reschedule appt for Medicare Well Visit

## 2021-10-02 NOTE — Telephone Encounter (Signed)
Lmom asking pt to call back to reschedule appt. 

## 2021-10-10 ENCOUNTER — Ambulatory Visit: Payer: Medicare PPO

## 2021-10-12 ENCOUNTER — Ambulatory Visit: Payer: Medicare PPO

## 2021-10-16 ENCOUNTER — Ambulatory Visit (INDEPENDENT_AMBULATORY_CARE_PROVIDER_SITE_OTHER): Payer: Medicare PPO | Admitting: *Deleted

## 2021-10-16 DIAGNOSIS — Z0000 Encounter for general adult medical examination without abnormal findings: Secondary | ICD-10-CM | POA: Diagnosis not present

## 2021-10-16 NOTE — Progress Notes (Signed)
Subjective:   Shawn Kitchens Sr. is a 75 y.o. male who presents for Medicare Annual/Subsequent preventive examination.  I connected with  Shawn Kitchens Sr. on 10/16/21 by a telephone enabled telemedicine application and verified that I am speaking with the correct person using two identifiers.   I discussed the limitations of evaluation and management by telemedicine. The patient expressed understanding and agreed to proceed.  Patient location: home  Provider location: tele-health visit not in office    Review of Systems     Cardiac Risk Factors include: advanced age (>65mn, >>89women);male gender;hypertension     Objective:    Today's Vitals   There is no height or weight on file to calculate BMI.  Advanced Directives 10/16/2021 05/04/2021 01/30/2021 10/09/2020 08/03/2020 03/27/2020 02/01/2020  Does Patient Have a Medical Advance Directive? Yes Yes Yes Yes Yes Yes Yes  Type of AParamed[place]of [place]ort[place] Lakes[place] will [place] will [place] will [place] will [place] Does patient want to make changes to medical advance directive? - No - Patient declined - - - No - Patient declined -  Copy of HSeldoviain Chart? Yes - validated most recent copy scanned in chart (See row information) No - copy requested - No - copy requested Yes - validated most recent copy scanned in chart (See row information) No - copy requested -    Current Medications (verified) Outpatient Encounter Medications as of 10/16/2021  Medication Sig   acetaminophen (TYLENOL) 500 MG tablet Take 1,000 mg by mouth every 6 (six) hours as needed for moderate pain or headache.   ALLERGY RELIEF 180 MG tablet TAKE 1 TABLET BY MOUTH EVERY DAY   aspirin EC 81 MG tablet Take 81 mg by mouth daily.   atorvastatin (LIPITOR)  80 MG tablet Take 1 tablet (80 mg total) by mouth at bedtime.   Cholecalciferol (VITAMIN D3) 50 MCG (2000 UT) CHEW Chew by mouth.   flecainide (TAMBOCOR) 100 MG tablet Take 2 tablets (200 mg total) as needed by mouth. As needed for recurrent tachycardia.  Do not repeat more than once per day or 3x per wk   metoprolol succinate (TOPROL-XL) 25 MG 24 hr tablet Take 1 tablet (25 mg total) by mouth daily.   niacin (NIASPAN) 1000 MG CR tablet TAKE 1 TABLET(1000 MG) BY MOUTH AT BEDTIME   nitroGLYCERIN (NITROSTAT) 0.4 MG SL tablet nitroglycerin 0.4 mg subl   pantoprazole (PROTONIX) 40 MG tablet TAKE 1 TABLET(40 MG) BY MOUTH DAILY   ramipril (ALTACE) 10 MG capsule Take 10 mg by mouth daily.   vitamin B-12 (CYANOCOBALAMIN) 1000 MCG tablet Take 1 tablet (1,000 mcg total) by mouth daily.   benzonatate (TESSALON) 200 MG capsule Take 1 capsule (200 mg total) by mouth 2 (two) times daily as needed for cough.   No facility-administered encounter medications on file as of 10/16/2021.    Allergies (verified) Patient has no known allergies.   History: Past Medical History:  Diagnosis Date   Arthritis    Basal cell carcinoma 1999   basal cell/skin   CAD (coronary artery disease)    Cataract Cataract surgery 2017   GERD (gastroesophageal reflux disease)    History of bone marrow biopsy    History of kidney stones 2013   passed stone on his own   Hyperlipidemia    Hypertension  Leukopenia    Shingles    Ventricular tachycardia    Past Surgical History:  Procedure Laterality Date   CARDIAC CATHETERIZATION N/A 01/16/2016   Procedure: Left Heart Cath and Coronary Angiography;  Surgeon: [name], MD;  Location: [facility];  Service: Cardiovascular;  Laterality: N/A;   CATARACT EXTRACTION Left 2017   COLONOSCOPY WITH PROPOFOL N/A 03/27/2020   Procedure: COLONOSCOPY WITH PROPOFOL;  Surgeon: [name], MD;  Location: [facility];  Service: Endoscopy;  Laterality: N/A;   priority 4   CORONARY ANGIOPLASTY  2000 and 2003   EYE SURGERY  2017   Cateract   GANGLION CYST EXCISION  1979   HAND SURGERY Right 02/08/2021   KNEE ARTHROSCOPY WITH MEDIAL MENISECTOMY Left 12/01/2017   Procedure: KNEE ARTHROSCOPY WITH MEDIAL MENISECTOMY;  Surgeon: [name], MD;  Location: [facility];  Service: Orthopedics;  Laterality: Left;  Partial   LEFT HEART CATH Right 08/07/2017   Procedure: Left Heart Cath;  Surgeon: [name], MD;  Location: [facility];  Service: Cardiovascular;  Laterality: Right;   PILONIDAL CYST EXCISION  1977   POLYPECTOMY  03/27/2020   Procedure: POLYPECTOMY;  Surgeon: [name], MD;  Location: [facility];  Service: Endoscopy;;   SKIN CANCER EXCISION  1999   ear   SYNOVECTOMY Left 12/01/2017   Procedure: SYNOVECTOMY;  Surgeon: [name], MD;  Location: [facility];  Service: Orthopedics;  Laterality: Left;  Partial   V TACH ABLATION N/A 08/14/2017   Procedure: V Tach Ablation;  Surgeon: [name], MD;  Location: [facility];  Service: Cardiovascular;  Laterality: N/A;   VEIN SURGERY  2000   laser   Family History  Problem Relation Age of Onset   Hypertension Mother    Heart disease Mother    Heart attack Mother    Diabetes Mother    Alzheimer's disease Father    Alzheimer's disease Brother    Congestive Heart Failure Brother    Hypertension Brother    Other Brother        dementia   Heart disease Brother    Brain cancer Daughter 50   Cancer Daughter    Heart disease Son    Cancer Maternal Uncle    Cancer Maternal Grandfather    Social History   Socioeconomic History   Marital status: Married    Spouse name: Not on file   Number of children: 2   Years of education: Not on file   Highest education level: Master's degree (e.g., MA, MS, MEng, MEd, MSW, MBA)  Occupational History   Occupation: retired  Tobacco Use   Smoking status: Former    Packs/day: 1.00    Years: 5.00    Pack  years: 5.00    Types: Cigarettes    Quit date: 06/21/1971    Years since quitting: 50.3   Smokeless tobacco: Former    Types: Chew    Quit date: 06/20/1980  Vaping Use   Vaping Use: Never used  Substance and Sexual Activity   Alcohol use: Not Currently    Alcohol/week: 0.0 standard drinks   Drug use: No   Sexual activity: Not Currently  Other Topics Concern   Not on file  Social History Narrative   Lives in [place]   Retired school principal   Social Determinants of Health   Financial Resource Strain: Low Risk    Difficulty of Paying Living Expenses: Not hard at all  Food  Insecurity: No Food Insecurity   Worried About Charity fundraiser in the Last Year: Never true   Ran Out of Food in the Last Year: Never true  Transportation Needs: No Transportation Needs   Lack of Transportation (Medical): No   Lack of Transportation (Non-Medical): No  Physical Activity: Unknown   Days of Exercise per Week: Not on file   Minutes of Exercise per Session: 40 min  Stress: No Stress Concern Present   Feeling of Stress : Not at all  Social Connections: Socially Integrated   Frequency of Communication with Friends and Family: More than three times a week   Frequency of Social Gatherings with Friends and Family: More than three times a week   Attends Religious Services: More than 4 times per year   Active Member of [employer] or Organizations: Yes   Attends Music therapist: More than 4 times per year   Marital Status: Married    Tobacco Counseling Counseling given: Not Answered   Clinical Intake:  Pre-visit preparation completed: Yes  Pain : No/denies pain     Nutritional Risks: None Diabetes: No  How often do you need to have someone help you when you read instructions, pamphlets, or other written materials from your doctor or pharmacy?: 1 - Never  Diabetic?  no  Interpreter Needed?: No  Information entered by :: Leroy Kennedy LPN   Activities of Daily  Living In your present state of health, do you have any difficulty performing the following activities: 10/16/2021 11/28/2020  Hearing? Y N  Vision? N Y  Difficulty concentrating or making decisions? N N  Walking or climbing stairs? N N  Dressing or bathing? N N  Doing errands, shopping? N N  Preparing Food and eating ? N -  Using the Toilet? N -  In the past six months, have you accidently leaked urine? N -  Do you have problems with loss of bowel control? N -  Managing your Medications? N -  Managing your Finances? N -  Housekeeping or managing your Housekeeping? N -  Some recent data might be hidden    Patient Care Team: Venita Lick, NP as PCP - General (Nurse Practitioner) [name], MD as Consulting Physician (Cardiology) [name], MD as Consulting Physician (Oncology)  Indicate any recent Medical Services you may have received from other than [facility] providers in the past year (date may be approximate).     Assessment:   This is a routine wellness examination for Vedansh.  Hearing/Vision screen Hearing Screening - Comments:: Right ear hearing loss Has done hearing test Does not have hearing aids at this time Vision Screening - Comments:: Up to date Dr. Matilde Sprang  Dietary issues and exercise activities discussed: Current Exercise Habits: Home exercise routine, Type of exercise: walking;stretching, Time (Minutes): 40, Frequency (Times/Week): 3, Weekly Exercise (Minutes/Week): 120, Intensity: Mild   Goals Addressed             This Visit's Progress    Patient Stated       Loose weight        Depression Screen PHQ 2/9 Scores 10/16/2021 11/28/2020 10/09/2020 10/07/2019 10/01/2018 05/04/2018 10/17/2017  PHQ - 2 Score 0 0 0 0 0 0 0  PHQ- 9 Score - - 0 - 2 - 0    Fall Risk Fall Risk  10/16/2021 11/28/2020 10/09/2020 10/07/2019 11/09/2018  Falls in the past year? 0 0 0 0 0  Number falls in past yr: 0 - -  0 -  Injury with Fall? 0 0 - 0 -  Risk for fall due to :  - No Fall Risks Medication side effect - -  Follow up Falls evaluation completed;Falls prevention discussed - Falls evaluation completed;Education provided;Falls prevention discussed - Falls evaluation completed    FALL RISK PREVENTION PERTAINING TO THE HOME:  Any stairs in or around the home? No  If so, are there any without handrails? No  Home free of loose throw rugs in walkways, pet beds, electrical cords, etc? Yes  Adequate lighting in your home to reduce risk of falls? Yes   ASSISTIVE DEVICES UTILIZED TO PREVENT FALLS:  Life alert? No  Use of a cane, walker or w/c? No  Grab bars in the bathroom? Yes  Shower chair or bench in shower? No  Elevated toilet seat or a handicapped toilet?  yes  TIMED UP AND GO:  Was the test performed? No .    Cognitive Function:     6CIT Screen 10/09/2020 10/01/2018 09/11/2017  What Year? 0 points 0 points 0 points  What month? 0 points 0 points 0 points  What time? 0 points 0 points 0 points  Count back from 20 0 points 0 points 0 points  Months in reverse 0 points 0 points 0 points  Repeat phrase 0 points 0 points 0 points  Total Score 0 0 0    Immunizations Immunization History  Administered Date(s) Administered   Fluad Quad(high Dose 65+) 09/12/2021   Influenza, High Dose Seasonal PF 08/26/2017, 08/05/2019   Influenza-Unspecified 09/27/2015, 08/21/2016, 08/26/2017, 09/04/2018, 08/16/2020   Moderna Sars-Covid-2 Vaccination 01/07/2020, 02/05/2020, 08/07/2020, 01/30/2021   Pneumococcal Conjugate-13 12/08/2014   Pneumococcal Polysaccharide-23 09/10/2016   Td 12/21/2007, 12/16/2019   Zoster, Live 12/22/2008    TDAP status: Up to date  Flu Vaccine status: Up to date  Pneumococcal vaccine status: Up to date  Covid-19 vaccine status: Completed vaccines  Qualifies for Shingles Vaccine? Yes   Zostavax completed Yes   Shingrix Completed?: No.    Education has been provided regarding the importance of this vaccine. Patient has been  advised to call insurance company to determine out of pocket expense if they have not yet received this vaccine. Advised may also receive vaccine at local pharmacy or Health Dept. Verbalized acceptance and understanding.  Screening Tests Health Maintenance  Topic Date Due   Zoster Vaccines- Shingrix (1 of 2) Never done   COVID-19 Vaccine (5 - Booster for Moderna series) 03/27/2021   TETANUS/TDAP  12/15/2029   COLONOSCOPY (Pts 45-76yr Insurance coverage will need to be confirmed)  03/27/2030   Pneumonia Vaccine 75 Years old  Completed   INFLUENZA VACCINE  Completed   Hepatitis C Screening  Completed   HPV VACCINES  Aged Out    Health Maintenance  Health Maintenance Due  Topic Date Due   Zoster Vaccines- Shingrix (1 of 2) Never done   COVID-19 Vaccine (5 - Booster for Moderna series) 03/27/2021    Colorectal cancer screening: Type of screening: Colonoscopy. Completed 2021. Repeat every 10 years  Lung Cancer Screening: (Low Dose CT Chest recommended if Age 74-80years, 30 pack-year currently smoking OR have quit w/in 15years.) does not qualify.   Lung Cancer Screening Referral:   Additional Screening:  Hepatitis C Screening: does not qualify; Completed 2020  Vision Screening: Recommended annual ophthalmology exams for early detection of glaucoma and other disorders of the eye. Is the patient up to date with their annual eye exam?  Yes  Who  is the provider or what is the name of the office in which the patient attends annual eye exams?  If pt is not established with a provider, would they like to be referred to a provider to establish care? No .   Dental Screening: Recommended annual dental exams for proper oral hygiene  Community Resource Referral / Chronic Care Management: CRR required this visit?  No   CCM required this visit?  No      Plan:     I have personally reviewed and noted the following in the patient's chart:   Medical and social history Use of alcohol,  tobacco or illicit drugs  Current medications and supplements including opioid prescriptions. Patient is not currently taking opioid prescriptions. Functional ability and status Nutritional status Physical activity Advanced directives List of other physicians Hospitalizations, surgeries, and ER visits in previous 12 months Vitals Screenings to include cognitive, depression, and falls Referrals and appointments  In addition, I have reviewed and discussed with patient certain preventive protocols, quality metrics, and best practice recommendations. A written personalized care plan for preventive services as well as general preventive health recommendations were provided to patient.     Leroy Kennedy, LPN   [id number]   Nurse Notes:

## 2021-10-16 NOTE — Patient Instructions (Signed)
Shawn Shepard , Thank you for taking time to come for your Medicare Wellness Visit. I appreciate your ongoing commitment to your health goals. Please review the following plan we discussed and let me know if I can assist you in the future.   Screening recommendations/referrals: Colonoscopy: up to date Recommended yearly ophthalmology/optometry visit for glaucoma screening and checkup Recommended yearly dental visit for hygiene and checkup  Vaccinations: Influenza vaccine: Education provided Pneumococcal vaccine: up to date Tdap vaccine: up to date Shingles vaccine: Education provided    Advanced directives: on file  Conditions/risks identified:   Next appointment: 11/30/2021 @ 9:00 am Shawn Shepard 75 Years and Older, Male Preventive care refers to lifestyle choices and visits with your health care provider that can promote health and wellness. What does preventive care include? A yearly physical exam. This is also called an annual well check. Dental exams once or twice a year. Routine eye exams. Ask your health care provider how often you should have your eyes checked. Personal lifestyle choices, including: Daily care of your teeth and gums. Regular physical activity. Eating a healthy diet. Avoiding tobacco and drug use. Limiting alcohol use. Practicing safe sex. Taking low doses of aspirin every day. Taking vitamin and mineral supplements as recommended by your health care provider. What happens during an annual well check? The services and screenings done by your health care provider during your annual well check will depend on your age, overall health, lifestyle risk factors, and family history of disease. Counseling  Your health care provider may ask you questions about your: Alcohol use. Tobacco use. Drug use. Emotional well-being. Home and relationship well-being. Sexual activity. Eating habits. History of falls. Memory and ability to understand  (cognition). Work and work Statistician. Screening  You may have the following tests or measurements: Height, weight, and BMI. Blood pressure. Lipid and cholesterol levels. These may be checked every 5 years, or more frequently if you are over 55 years old. Skin check. Lung cancer screening. You may have this screening every year starting at age 20 if you have a 30-pack-year history of smoking and currently smoke or have quit within the past 15 years. Fecal occult blood test (FOBT) of the stool. You may have this test every year starting at age 74. Flexible sigmoidoscopy or colonoscopy. You may have a sigmoidoscopy every 5 years or a colonoscopy every 10 years starting at age 74. Prostate cancer screening. Recommendations will vary depending on your family history and other risks. Hepatitis C blood test. Hepatitis B blood test. Sexually transmitted disease (STD) testing. Diabetes screening. This is done by checking your blood sugar (glucose) after you have not eaten for a while (fasting). You may have this done every 1-3 years. Abdominal aortic aneurysm (AAA) screening. You may need this if you are a current or former smoker. Osteoporosis. You may be screened starting at age 74 if you are at high risk. Talk with your health care provider about your test results, treatment options, and if necessary, the need for more tests. Vaccines  Your health care provider may recommend certain vaccines, such as: Influenza vaccine. This is recommended every year. Tetanus, diphtheria, and acellular pertussis (Tdap, Td) vaccine. You may need a Td booster every 10 years. Zoster vaccine. You may need this after age 53. Pneumococcal 13-valent conjugate (PCV13) vaccine. One dose is recommended after age 74. Pneumococcal polysaccharide (PPSV23) vaccine. One dose is recommended after age 74. Talk to your health care provider about which screenings and vaccines  you need and how often you need them. This  information is not intended to replace advice given to you by your health care provider. Make sure you discuss any questions you have with your health care provider. Document Released: 12/15/2015 Document Revised: 08/07/2016 Document Reviewed: 09/19/2015 Elsevier Interactive Patient Education  2017 [place] Prevention in the Home Falls can cause injuries. They can happen to people of all ages. There are many things you can do to make your home safe and to help prevent falls. What can I do on the outside of my home? Regularly fix the edges of walkways and driveways and fix any cracks. Remove anything that might make you trip as you walk through a door, such as a raised step or threshold. Trim any bushes or trees on the path to your home. Use bright outdoor lighting. Clear any walking paths of anything that might make someone trip, such as rocks or tools. Regularly check to see if handrails are loose or broken. Make sure that both sides of any steps have handrails. Any raised decks and porches should have guardrails on the edges. Have any leaves, snow, or ice cleared regularly. Use sand or salt on walking paths during winter. Clean up any spills in your garage right away. This includes oil or grease spills. What can I do in the bathroom? Use night lights. Install grab bars by the toilet and in the tub and shower. Do not use towel bars as grab bars. Use non-skid mats or decals in the tub or shower. If you need to sit down in the shower, use a plastic, non-slip stool. Keep the floor dry. Clean up any water that spills on the floor as soon as it happens. Remove soap buildup in the tub or shower regularly. Attach bath mats securely with double-sided non-slip rug tape. Do not have throw rugs and other things on the floor that can make you trip. What can I do in the bedroom? Use night lights. Make sure that you have a light by your bed that is easy to reach. Do not use any sheets or  blankets that are too big for your bed. They should not hang down onto the floor. Have a firm chair that has side arms. You can use this for support while you get dressed. Do not have throw rugs and other things on the floor that can make you trip. What can I do in the kitchen? Clean up any spills right away. Avoid walking on wet floors. Keep items that you use a lot in easy-to-reach places. If you need to reach something above you, use a strong step stool that has a grab bar. Keep electrical cords out of the way. Do not use floor polish or wax that makes floors slippery. If you must use wax, use non-skid floor wax. Do not have throw rugs and other things on the floor that can make you trip. What can I do with my stairs? Do not leave any items on the stairs. Make sure that there are handrails on both sides of the stairs and use them. Fix handrails that are broken or loose. Make sure that handrails are as long as the stairways. Check any carpeting to make sure that it is firmly attached to the stairs. Fix any carpet that is loose or worn. Avoid having throw rugs at the top or bottom of the stairs. If you do have throw rugs, attach them to the floor with carpet tape. Make sure  that you have a light switch at the top of the stairs and the bottom of the stairs. If you do not have them, ask someone to add them for you. What else can I do to help prevent falls? Wear shoes that: Do not have high heels. Have rubber bottoms. Are comfortable and fit you well. Are closed at the toe. Do not wear sandals. If you use a stepladder: Make sure that it is fully opened. Do not climb a closed stepladder. Make sure that both sides of the stepladder are locked into place. Ask someone to hold it for you, if possible. Clearly mark and make sure that you can see: Any grab bars or handrails. First and last steps. Where the edge of each step is. Use tools that help you move around (mobility aids) if they are  needed. These include: Canes. Walkers. Scooters. Crutches. Turn on the lights when you go into a dark area. Replace any light bulbs as soon as they burn out. Set up your furniture so you have a clear path. Avoid moving your furniture around. If any of your floors are uneven, fix them. If there are any pets around you, be aware of where they are. Review your medicines with your doctor. Some medicines can make you feel dizzy. This can increase your chance of falling. Ask your doctor what other things that you can do to help prevent falls. This information is not intended to replace advice given to you by your health care provider. Make sure you discuss any questions you have with your health care provider. Document Released: 09/14/2009 Document Revised: 04/25/2016 Document Reviewed: 12/23/2014 Elsevier Interactive Patient Education  2017 [employer].

## 2021-11-30 ENCOUNTER — Encounter: Payer: Medicare PPO | Admitting: Nurse Practitioner

## 2021-12-05 ENCOUNTER — Ambulatory Visit: Payer: Medicare PPO | Admitting: Nurse Practitioner

## 2021-12-05 ENCOUNTER — Other Ambulatory Visit: Payer: Self-pay

## 2021-12-05 ENCOUNTER — Encounter: Payer: Self-pay | Admitting: Nurse Practitioner

## 2021-12-05 DIAGNOSIS — R051 Acute cough: Secondary | ICD-10-CM | POA: Diagnosis not present

## 2021-12-05 DIAGNOSIS — J0110 Acute frontal sinusitis, unspecified: Secondary | ICD-10-CM

## 2021-12-05 LAB — VERITOR FLU A/B WAIVED
Influenza A: NEGATIVE
Influenza B: NEGATIVE

## 2021-12-05 MED ORDER — AZITHROMYCIN 250 MG PO TABS: ORAL_TABLET | ORAL | 0 refills | Status: AC

## 2021-12-05 MED ORDER — METHYLPREDNISOLONE 4 MG PO TBPK: ORAL_TABLET | ORAL | 0 refills | Status: DC

## 2021-12-05 NOTE — Progress Notes (Signed)
Results discussed with patient in office.

## 2021-12-05 NOTE — Progress Notes (Signed)
There were no vitals taken for this visit.   Subjective:    Patient ID: Shawn Kitchens Sr., male    DOB: 03-12-1946, 76 y.o.   MRN: [id number]  HPI: Shawn HAMBLY Sr. is a 76 y.o. male  Chief Complaint  Patient presents with   URI    Pt states he started having a cough, congestion, drainage, sinus pressure, and sinus headaches. States symptoms started about 2 days ago.     UPPER RESPIRATORY TRACT INFECTION Worst symptom: symptoms started 2 days ago. Has suffered from allergies for many years. Fever: no Cough: yes Shortness of breath: no Wheezing: no Chest pain: no Chest tightness: no Chest congestion: yes Nasal congestion: yes Runny nose: yes Post nasal drip: yes Sneezing: yes Sore throat: no Swollen glands: no Sinus pressure: yes Headache: yes Face pain: no Toothache: no Ear pain: no bilateral Ear pressure: no bilateral Eyes red/itching:no Eye drainage/crusting: no  Vomiting: no Rash: no Fatigue: yes Sick contacts: no Strep contacts: no  Context: stable Recurrent sinusitis: no Relief with OTC cold/cough medications:  a little   Treatments attempted: mucinex   Relevant past medical, surgical, family and social history reviewed and updated as indicated. Interim medical history since our last visit reviewed. Allergies and medications reviewed and updated.  Review of Systems  Constitutional:  Positive for fatigue. Negative for fever.  HENT:  Positive for congestion, postnasal drip, rhinorrhea and sinus pressure. Negative for ear pain, sinus pain, sneezing and sore throat.   Respiratory:  Positive for cough. Negative for chest tightness, shortness of breath and wheezing.   Gastrointestinal:  Negative for vomiting.  Skin:  Negative for rash.  Neurological:  Positive for headaches.   Per HPI unless specifically indicated above     Objective:    There were no vitals taken for this visit.  Wt Readings from Last 3 Encounters:  09/18/21 203 lb 1.6 oz  (92.1 kg)  08/09/21 193 lb (87.5 kg)  05/29/21 199 lb 6.4 oz (90.4 kg)    Physical Exam Vitals and nursing note reviewed.  Constitutional:      General: He is not in acute distress.    Appearance: Normal appearance. He is not ill-appearing, toxic-appearing or diaphoretic.  HENT:     Head: Normocephalic.     Right Ear: Tympanic membrane and external ear normal.     Left Ear: Tympanic membrane and external ear normal.     Nose: Congestion and rhinorrhea present.     Right Sinus: Maxillary sinus tenderness present. No frontal sinus tenderness.     Left Sinus: Maxillary sinus tenderness present. No frontal sinus tenderness.     Mouth/Throat:     Mouth: Mucous membranes are moist.     Pharynx: Posterior oropharyngeal erythema present. No oropharyngeal exudate.  Eyes:     General:        Right eye: No discharge.        Left eye: No discharge.     Extraocular Movements: Extraocular movements intact.     Conjunctiva/sclera: Conjunctivae normal.     Pupils: Pupils are equal, round, and reactive to light.  Cardiovascular:     Rate and Rhythm: Normal rate and regular rhythm.     Heart sounds: No murmur heard. Pulmonary:     Effort: Pulmonary effort is normal. No respiratory distress.     Breath sounds: Normal breath sounds. No wheezing, rhonchi or rales.  Abdominal:     General: Abdomen is flat. Bowel sounds are normal.  Musculoskeletal:     Cervical back: Normal range of motion and neck supple.  Skin:    General: Skin is warm and dry.     Capillary Refill: Capillary refill takes less than 2 seconds.  Neurological:     General: No focal deficit present.     Mental Status: He is alert and oriented to person, place, and time.  Psychiatric:        Mood and Affect: Mood normal.        Behavior: Behavior normal.        Thought Content: Thought content normal.        Judgment: Judgment normal.    Results for orders placed or performed in visit on 09/18/21  CBC with  Differential/Platelet  Result Value Ref Range   WBC 2.8 (L) 4.0 - 10.5 K/uL   RBC 4.03 (L) 4.22 - 5.81 MIL/uL   Hemoglobin 12.6 (L) 13.0 - 17.0 g/dL   HCT 38.5 (L) 39.0 - 52.0 %   MCV 95.5 80.0 - 100.0 fL   MCH 31.3 26.0 - 34.0 pg   MCHC 32.7 30.0 - 36.0 g/dL   RDW 14.2 11.5 - 15.5 %   Platelets 151 150 - 400 K/uL   nRBC 0.0 0.0 - 0.2 %   Neutrophils Relative % 48 %   Neutro Abs 1.3 (L) 1.7 - 7.7 K/uL   Lymphocytes Relative 29 %   Lymphs Abs 0.8 0.7 - 4.0 K/uL   Monocytes Relative 16 %   Monocytes Absolute 0.4 0.1 - 1.0 K/uL   Eosinophils Relative 6 %   Eosinophils Absolute 0.2 0.0 - 0.5 K/uL   Basophils Relative 1 %   Basophils Absolute 0.0 0.0 - 0.1 K/uL   Immature Granulocytes 0 %   Abs Immature Granulocytes 0.01 0.00 - 0.07 K/uL      Assessment & Plan:   Problem List Items Addressed This Visit   None Visit Diagnoses     Acute non-recurrent frontal sinusitis    -  Primary   Flu neg. Complete course of medications. Can use OTC cough/cold medication for symptom management. FU if symptoms do not improve.   Relevant Medications   azithromycin (ZITHROMAX) 250 MG tablet   methylPREDNISolone (MEDROL DOSEPAK) 4 MG TBPK tablet   Acute cough       Relevant Orders   Veritor Flu A/B Waived   Novel Coronavirus, NAA ([facility])        Follow up plan: Return if symptoms worsen or fail to improve.

## 2021-12-07 LAB — NOVEL CORONAVIRUS, NAA: SARS-CoV-2, NAA: NOT DETECTED

## 2021-12-07 LAB — SARS-COV-2, NAA 2 DAY TAT

## 2021-12-07 NOTE — Progress Notes (Signed)
Hi Phil.  Your COVID test was negative.

## 2021-12-31 ENCOUNTER — Ambulatory Visit: Payer: Medicare PPO | Admitting: Nurse Practitioner

## 2022-01-01 ENCOUNTER — Other Ambulatory Visit: Payer: Self-pay

## 2022-01-01 ENCOUNTER — Encounter: Payer: Self-pay | Admitting: Nurse Practitioner

## 2022-01-01 ENCOUNTER — Ambulatory Visit (INDEPENDENT_AMBULATORY_CARE_PROVIDER_SITE_OTHER): Payer: Medicare PPO | Admitting: Nurse Practitioner

## 2022-01-01 VITALS — BP 96/64 | HR 62 | Temp 97.9°F | Ht 71.5 in | Wt 196.6 lb

## 2022-01-01 DIAGNOSIS — I2583 Coronary atherosclerosis due to lipid rich plaque: Secondary | ICD-10-CM

## 2022-01-01 DIAGNOSIS — E538 Deficiency of other specified B group vitamins: Secondary | ICD-10-CM

## 2022-01-01 DIAGNOSIS — K2100 Gastro-esophageal reflux disease with esophagitis, without bleeding: Secondary | ICD-10-CM

## 2022-01-01 DIAGNOSIS — M4003 Postural kyphosis, cervicothoracic region: Secondary | ICD-10-CM

## 2022-01-01 DIAGNOSIS — M8588 Other specified disorders of bone density and structure, other site: Secondary | ICD-10-CM

## 2022-01-01 DIAGNOSIS — D696 Thrombocytopenia, unspecified: Secondary | ICD-10-CM

## 2022-01-01 DIAGNOSIS — Z0000 Encounter for general adult medical examination without abnormal findings: Secondary | ICD-10-CM

## 2022-01-01 DIAGNOSIS — D709 Neutropenia, unspecified: Secondary | ICD-10-CM | POA: Diagnosis not present

## 2022-01-01 DIAGNOSIS — E7800 Pure hypercholesterolemia, unspecified: Secondary | ICD-10-CM | POA: Diagnosis not present

## 2022-01-01 DIAGNOSIS — M40209 Unspecified kyphosis, site unspecified: Secondary | ICD-10-CM | POA: Insufficient documentation

## 2022-01-01 DIAGNOSIS — J3089 Other allergic rhinitis: Secondary | ICD-10-CM

## 2022-01-01 DIAGNOSIS — I472 Ventricular tachycardia, unspecified: Secondary | ICD-10-CM

## 2022-01-01 DIAGNOSIS — I2510 Atherosclerotic heart disease of native coronary artery without angina pectoris: Secondary | ICD-10-CM

## 2022-01-01 DIAGNOSIS — I10 Essential (primary) hypertension: Secondary | ICD-10-CM | POA: Diagnosis not present

## 2022-01-01 LAB — MICROALBUMIN, URINE WAIVED
Creatinine, Urine Waived: 200 mg/dL (ref 10–300)
Microalb, Ur Waived: 10 mg/L (ref 0–19)
Microalb/Creat Ratio: <30 mg/g (ref <30)

## 2022-01-01 MED ORDER — ATORVASTATIN CALCIUM 80 MG PO TABS: 80.0000 mg | ORAL_TABLET | Freq: Every day | ORAL | 4 refills | Status: DC

## 2022-01-01 MED ORDER — PANTOPRAZOLE SODIUM 40 MG PO TBEC: DELAYED_RELEASE_TABLET | ORAL | 4 refills | Status: DC

## 2022-01-01 MED ORDER — METOPROLOL SUCCINATE ER 25 MG PO TB24: 25.0000 mg | ORAL_TABLET | Freq: Every day | ORAL | 4 refills | Status: DC

## 2022-01-01 MED ORDER — NIACIN ER (ANTIHYPERLIPIDEMIC) 1000 MG PO TBCR: EXTENDED_RELEASE_TABLET | ORAL | 4 refills | Status: DC

## 2022-01-01 NOTE — Assessment & Plan Note (Signed)
Chronic, stable, has seen ENT in past.  Continue daily allergy medication and avoid Benadryl.

## 2022-01-01 NOTE — Assessment & Plan Note (Signed)
Continue collaboration with cardiology, no recent use Tambocor.

## 2022-01-01 NOTE — Assessment & Plan Note (Signed)
Continue collaboration with hematology, recent note and labs reviewed.

## 2022-01-01 NOTE — Patient Instructions (Signed)

## 2022-01-01 NOTE — Assessment & Plan Note (Signed)
Noted on exam, no current back pain with this.  Monitor and recommend if pain presents we pursue physical therapy.

## 2022-01-01 NOTE — Assessment & Plan Note (Signed)
Chronic, stable with BP at goal in office today.  Recommend he monitor BP at least a few mornings a week at home and document.  DASH diet at home.  Continue current medication regimen and adjust as needed.  Labs today: CBC, CMP, urine ALB, and TSH.  Return in 6 months.

## 2022-01-01 NOTE — Assessment & Plan Note (Signed)
Chronic, ongoing.  Continue current medication regimen and adjust as needed.  Mag level today.  We discussed current swallowing event, at this time wishes to monitor, if any recurrence will get into GI for further assessment -- at this time is working on diet changes and eating slower.

## 2022-01-01 NOTE — Assessment & Plan Note (Signed)
Continue collaboration with hematology, recent labs and note reviewed.

## 2022-01-01 NOTE — Assessment & Plan Note (Signed)
Chronic, ongoing.  Continue current medication regimen and adjust as needed. Lipid panel today. 

## 2022-01-01 NOTE — Progress Notes (Signed)
BP 96/64    Pulse 62    Temp 97.9 F (36.6 C) (Oral)    Ht 5' 11.5" (1.816 m)    Wt 196 lb 9.6 oz (89.2 kg)    SpO2 97%    BMI 27.04 kg/m    Subjective:    Patient ID: Shawn Kitchens Sr., male    DOB: 13-Jul-1946, 76 y.o.   MRN: [id number]  HPI: Shawn BARTLESON Sr. is a 76 y.o. male presenting on 01/01/2022 for comprehensive medical examination. Current medical complaints include:none  He currently lives with: wife Interim Problems from his last visit: no   HYPERTENSION / HYPERLIPIDEMIA Continues on Ramipril, Metoprolol, Tambocor as needed, Niacin, and Atorvastatin.  Sees cardiology every 6 months -- saw last week == had routine EKG which was normal per patient.  Had ablation 4 years ago for VT.  Has never had to take his Tambocor.  Dr. Humphrey Rolls did add on Lasix for swelling in right leg.   Satisfied with current treatment? yes Duration of hypertension: chronic BP monitoring frequency: not checking BP range:  BP medication side effects: no Duration of hyperlipidemia: chronic Cholesterol medication side effects: no Cholesterol supplements: none Medication compliance: good compliance Aspirin: yes Recent stressors: no Recurrent headaches: no Visual changes: no Palpitations: no Dyspnea: no Chest pain: no Lower extremity edema: to right leg at end of day only -- baseline for him Dizzy/lightheaded: no   LEUKOPENIA AND THROMBOCYTOPENIA: Noted on labs since 2018, followed by oncology and last saw 09/18/21.  Returns Q6MOS.  No fevers, night sweats, unexpected weight loss, decrease appetite.   GERD Continues on Protonix daily.  Is having some issues with choking, in November his wife had to do heimlich on him in steak house.  Since then is paying attention to how he chews food -- tends to happen more with steak or chicken.  His dad had to have esophagus stretched at same age patient is now.  Has had only one incident since November, last week feeling like chicken stuck in throat, but  drank water and this resolved it.   GERD control status: stable  Satisfied with current treatment? yes Heartburn frequency: none Medication side effects: no  Medication compliance: stable Dysphagia: as above Odynophagia:  no Hematemesis: no Blood in stool: no EGD: no   Functional Status Survey: Is the patient deaf or have difficulty hearing?: No Does the patient have difficulty seeing, even when wearing glasses/contacts?: No Does the patient have difficulty concentrating, remembering, or making decisions?: No Does the patient have difficulty walking or climbing stairs?: No Does the patient have difficulty dressing or bathing?: No Does the patient have difficulty doing errands alone such as visiting a doctor's office or shopping?: No  FALL RISK: Fall Risk  01/01/2022 10/16/2021 11/28/2020 10/09/2020 10/07/2019  Falls in the past year? 0 0 0 0 0  Number falls in past yr: 0 0 - - 0  Injury with Fall? 0 0 0 - 0  Risk for fall due to : No Fall Risks - No Fall Risks Medication side effect -  Follow up Falls evaluation completed Falls evaluation completed;Falls prevention discussed - Falls evaluation completed;Education provided;Falls prevention discussed -    Depression Screen Depression screen [facility] 2/9 01/01/2022 10/16/2021 11/28/2020 10/09/2020 10/07/2019  Decreased Interest 0 0 0 0 0  Down, Depressed, Hopeless 0 0 0 0 0  PHQ - 2 Score 0 0 0 0 0  Altered sleeping 0 - - 0 -  Tired, decreased energy 0 - -  0 -  Change in appetite 0 - - 0 -  Feeling bad or failure about yourself  0 - - 0 -  Trouble concentrating 0 - - 0 -  Moving slowly or fidgety/restless 0 - - 0 -  Suicidal thoughts 0 - - 0 -  PHQ-9 Score 0 - - 0 -    Advanced Directives <no information>  Past Medical History:  Past Medical History:  Diagnosis Date   Arthritis    Basal cell carcinoma 1999   basal cell/skin   CAD (coronary artery disease)    Cataract Cataract surgery 2017   GERD (gastroesophageal reflux  disease)    History of bone marrow biopsy    History of kidney stones 2013   passed stone on his own   Hyperlipidemia    Hypertension    Leukopenia    Shingles    Ventricular tachycardia     Surgical History:  Past Surgical History:  Procedure Laterality Date   CARDIAC CATHETERIZATION N/A 01/16/2016   Procedure: Left Heart Cath and Coronary Angiography;  Surgeon: [name], MD;  Location: [facility];  Service: Cardiovascular;  Laterality: N/A;   CATARACT EXTRACTION Left 2017   COLONOSCOPY WITH PROPOFOL N/A 03/27/2020   Procedure: COLONOSCOPY WITH PROPOFOL;  Surgeon: [name], MD;  Location: [facility];  Service: Endoscopy;  Laterality: N/A;  priority 4   CORONARY ANGIOPLASTY  2000 and 2003   EYE SURGERY  2017   Cateract   GANGLION CYST EXCISION  1979   HAND SURGERY Right 02/08/2021   blood clot removal per patient   KNEE ARTHROSCOPY WITH MEDIAL MENISECTOMY Left 12/01/2017   Procedure: KNEE ARTHROSCOPY WITH MEDIAL MENISECTOMY;  Surgeon: [name], MD;  Location: [facility];  Service: Orthopedics;  Laterality: Left;  Partial   LEFT HEART CATH Right 08/07/2017   Procedure: Left Heart Cath;  Surgeon: [name], MD;  Location: [facility];  Service: Cardiovascular;  Laterality: Right;   PILONIDAL CYST EXCISION  1977   POLYPECTOMY  03/27/2020   Procedure: POLYPECTOMY;  Surgeon: [name], MD;  Location: [facility];  Service: Endoscopy;;   SKIN CANCER EXCISION  1999   ear   SYNOVECTOMY Left 12/01/2017   Procedure: SYNOVECTOMY;  Surgeon: [name], MD;  Location: [facility];  Service: Orthopedics;  Laterality: Left;  Partial   V TACH ABLATION N/A 08/14/2017   Procedure: V Tach Ablation;  Surgeon: [name], MD;  Location: [facility];  Service: Cardiovascular;  Laterality: N/A;   VEIN SURGERY  2000   laser    Medications:  Current Outpatient Medications on File Prior to Visit  Medication Sig    acetaminophen (TYLENOL) 500 MG tablet Take 1,000 mg by mouth every 6 (six) hours as needed for moderate pain or headache.   ALLERGY RELIEF 180 MG tablet TAKE 1 TABLET BY MOUTH EVERY DAY   aspirin EC 81 MG tablet Take 81 mg by mouth daily.   Cholecalciferol (VITAMIN D3) 50 MCG (2000 UT) CHEW Chew by mouth.   flecainide (TAMBOCOR) 100 MG tablet Take 2 tablets (200 mg total) as needed by mouth. As needed for recurrent tachycardia.  Do not repeat more than once per day or 3x per wk   furosemide (LASIX) 20 MG tablet Take 20 mg by mouth.   nitroGLYCERIN (NITROSTAT) 0.4 MG SL tablet nitroglycerin 0.4 mg subl   ramipril (ALTACE) 10 MG capsule Take 10 mg by mouth daily.  vitamin B-12 (CYANOCOBALAMIN) 1000 MCG tablet Take 1 tablet (1,000 mcg total) by mouth daily.   No current facility-administered medications on file prior to visit.    Allergies:  No Known Allergies  Social History:  Social History   Socioeconomic History   Marital status: Married    Spouse name: Not on file   Number of children: 2   Years of education: Not on file   Highest education level: Master's degree (e.g., MA, MS, MEng, MEd, MSW, MBA)  Occupational History   Occupation: retired  Tobacco Use   Smoking status: Former    Packs/day: 1.00    Years: 5.00    Pack years: 5.00    Types: Cigarettes    Quit date: 06/21/1971    Years since quitting: 50.5   Smokeless tobacco: Former    Types: Chew    Quit date: 06/20/1980  Vaping Use   Vaping Use: Never used  Substance and Sexual Activity   Alcohol use: Not Currently    Alcohol/week: 0.0 standard drinks   Drug use: No   Sexual activity: Not Currently  Other Topics Concern   Not on file  Social History Narrative   Lives in [place]   Retired school principal   Social Determinants of Health   Financial Resource Strain: Low Risk    Difficulty of Paying Living Expenses: Not hard at all  Food Insecurity: No Food Insecurity   Worried About Charity fundraiser in  the Last Year: Never true   Arboriculturist in the Last Year: Never true  Transportation Needs: No Transportation Needs   Lack of Transportation (Medical): No   Lack of Transportation (Non-Medical): No  Physical Activity: Unknown   Days of Exercise per Week: Not on file   Minutes of Exercise per Session: 40 min  Stress: No Stress Concern Present   Feeling of Stress : Not at all  Social Connections: Socially Integrated   Frequency of Communication with Friends and Family: More than three times a week   Frequency of Social Gatherings with Friends and Family: More than three times a week   Attends Religious Services: More than 4 times per year   Active Member of [employer] or Organizations: Yes   Attends Music therapist: More than 4 times per year   Marital Status: Married  Human resources officer Violence: Not At Risk   Fear of Current or Ex-Partner: No   Emotionally Abused: No   Physically Abused: No   Sexually Abused: No   Social History   Tobacco Use  Smoking Status Former   Packs/day: 1.00   Years: 5.00   Pack years: 5.00   Types: Cigarettes   Quit date: 06/21/1971   Years since quitting: 50.5  Smokeless Tobacco Former   Types: Chew   Quit date: 06/20/1980   Social History   Substance and Sexual Activity  Alcohol Use Not Currently   Alcohol/week: 0.0 standard drinks    Family History:  Family History  Problem Relation Age of Onset   Hypertension Mother    Heart disease Mother    Heart attack Mother    Diabetes Mother    Alzheimer's disease Father    Alzheimer's disease Brother    Congestive Heart Failure Brother    Hypertension Brother    Other Brother        dementia   Heart disease Brother    Brain cancer Daughter 22   Cancer Daughter    Heart disease Son  Cancer Maternal Uncle    Cancer Maternal Grandfather     Past medical history, surgical history, medications, allergies, family history and social history reviewed with patient today and  changes made to appropriate areas of the chart.   Review of Systems - negative All other ROS negative except what is listed above and in the HPI.      Objective:    BP 96/64    Pulse 62    Temp 97.9 F (36.6 C) (Oral)    Ht 5' 11.5" (1.816 m)    Wt 196 lb 9.6 oz (89.2 kg)    SpO2 97%    BMI 27.04 kg/m   Wt Readings from Last 3 Encounters:  01/01/22 196 lb 9.6 oz (89.2 kg)  09/18/21 203 lb 1.6 oz (92.1 kg)  08/09/21 193 lb (87.5 kg)    Physical Exam Vitals and nursing note reviewed.  Constitutional:      General: He is awake. He is not in acute distress.    Appearance: He is well-developed and well-groomed. He is not ill-appearing.  HENT:     Head: Normocephalic and atraumatic.     Right Ear: Hearing, tympanic membrane, ear canal and external ear normal. No drainage.     Left Ear: Hearing, tympanic membrane, ear canal and external ear normal. No drainage.     Nose: Nose normal.     Mouth/Throat:     Pharynx: Uvula midline.  Eyes:     General: Lids are normal.        Right eye: No discharge.        Left eye: No discharge.     Extraocular Movements: Extraocular movements intact.     Conjunctiva/sclera: Conjunctivae normal.     Pupils: Pupils are equal, round, and reactive to light.     Visual Fields: Right eye visual fields normal and left eye visual fields normal.  Neck:     Thyroid: No thyromegaly.     Vascular: No carotid bruit or JVD.     Trachea: Trachea normal.  Cardiovascular:     Rate and Rhythm: Normal rate and regular rhythm.     Heart sounds: Normal heart sounds, S1 normal and S2 normal. No murmur heard.   No gallop.  Pulmonary:     Effort: Pulmonary effort is normal. No accessory muscle usage or respiratory distress.     Breath sounds: Normal breath sounds.  Abdominal:     General: Bowel sounds are normal.     Palpations: Abdomen is soft. There is no hepatomegaly or splenomegaly.     Tenderness: There is no abdominal tenderness.  Musculoskeletal:         General: Normal range of motion.     Cervical back: Normal range of motion and neck supple.     Lumbar back: No swelling, edema or tenderness. Normal range of motion. Negative right straight leg raise test and negative left straight leg raise test.     Right hip: Normal.     Left hip: No deformity, tenderness, bony tenderness or crepitus. Normal range of motion. Normal strength.     Right lower leg: No edema.     Left lower leg: No edema.     Comments: Mild thoracic kyphosis noted on back assessment, R>L.  Lymphadenopathy:     Head:     Right side of head: No submental, submandibular, tonsillar, preauricular or posterior auricular adenopathy.     Left side of head: No submental, submandibular, tonsillar, preauricular or posterior auricular  adenopathy.     Cervical: No cervical adenopathy.  Skin:    General: Skin is warm and dry.     Capillary Refill: Capillary refill takes less than 2 seconds.     Findings: No rash.  Neurological:     Mental Status: He is alert and oriented to person, place, and time.     Gait: Gait is intact.     Deep Tendon Reflexes: Reflexes are normal and symmetric.     Reflex Scores:      Brachioradialis reflexes are 2+ on the right side and 2+ on the left side.      Patellar reflexes are 2+ on the right side and 2+ on the left side. Psychiatric:        Attention and Perception: Attention normal.        Mood and Affect: Mood normal.        Speech: Speech normal.        Behavior: Behavior normal. Behavior is cooperative.        Thought Content: Thought content normal.        Cognition and Memory: Cognition normal.        Judgment: Judgment normal.   Results for orders placed or performed in visit on 01/01/22  Microalbumin, Urine Waived  Result Value Ref Range   Microalb, Ur Waived 10 0 - 19 mg/L   Creatinine, Urine Waived 200 10 - 300 mg/dL   Microalb/Creat Ratio <30 <30 mg/g      Assessment & Plan:   Problem List Items Addressed This Visit        Cardiovascular and Mediastinum   CAD (coronary artery disease)    Chronic, stable.  Continue current medication regimen and collaboration with cardiology.  No recent NTG use.      Relevant Medications   furosemide (LASIX) 20 MG tablet   atorvastatin (LIPITOR) 80 MG tablet   metoprolol succinate (TOPROL-XL) 25 MG 24 hr tablet   niacin (NIASPAN) 1000 MG CR tablet   Other Relevant Orders   Comprehensive metabolic panel   Lipid Panel w/o Chol/HDL Ratio   Essential hypertension    Chronic, stable with BP at goal in office today.  Recommend he monitor BP at least a few mornings a week at home and document.  DASH diet at home.  Continue current medication regimen and adjust as needed.  Labs today: CBC, CMP, urine ALB, and TSH.  Return in 6 months.       Relevant Medications   furosemide (LASIX) 20 MG tablet   atorvastatin (LIPITOR) 80 MG tablet   metoprolol succinate (TOPROL-XL) 25 MG 24 hr tablet   niacin (NIASPAN) 1000 MG CR tablet   Other Relevant Orders   Microalbumin, Urine Waived (Completed)   Comprehensive metabolic panel   TSH   Ventricular tachycardia    Continue collaboration with cardiology, no recent use Tambocor.      Relevant Medications   furosemide (LASIX) 20 MG tablet   atorvastatin (LIPITOR) 80 MG tablet   metoprolol succinate (TOPROL-XL) 25 MG 24 hr tablet   niacin (NIASPAN) 1000 MG CR tablet   Other Relevant Orders   Lipid Panel w/o Chol/HDL Ratio     Respiratory   Allergic rhinitis    Chronic, stable, has seen ENT in past.  Continue daily allergy medication and avoid Benadryl.          Digestive   Reflux esophagitis    Chronic, ongoing.  Continue current medication regimen and adjust as needed.  Mag level today.  We discussed current swallowing event, at this time wishes to monitor, if any recurrence will get into GI for further assessment -- at this time is working on diet changes and eating slower.      Relevant Orders   Magnesium      Musculoskeletal and Integument   Kyphosis    Noted on exam, no current back pain with this.  Monitor and recommend if pain presents we pursue physical therapy.      Osteopenia    Chronic, ongoing.  Recommend continue Vitamin D supplement and calcium daily.  Educated on this.  Obtain Vit D level today and plan on repeat DEXA in June 2023.      Relevant Orders   VITAMIN D 25 Hydroxy (Vit-D Deficiency, Fractures)     Hematopoietic and Hemostatic   Thrombocytopenia ([place]) - Primary    Continue collaboration with hematology, recent labs and note reviewed.      Relevant Orders   CBC with Differential/Platelet   Comprehensive metabolic panel     Other   Hyperlipidemia    Chronic, ongoing.  Continue current medication regimen and adjust as needed.  Lipid panel today.         Relevant Medications   furosemide (LASIX) 20 MG tablet   atorvastatin (LIPITOR) 80 MG tablet   metoprolol succinate (TOPROL-XL) 25 MG 24 hr tablet   niacin (NIASPAN) 1000 MG CR tablet   Other Relevant Orders   Comprehensive metabolic panel   Lipid Panel w/o Chol/HDL Ratio   Leukopenia    Continue collaboration with hematology, recent note and labs reviewed.      Relevant Orders   CBC with Differential/Platelet   Other Visit Diagnoses     B12 deficiency       History of low levels, recheck today and continue supplement as needed.   Relevant Orders   Vitamin B12   Encounter for annual physical exam       Annual physical today with labs and health maintenance reviewed.       Discussed aspirin prophylaxis for myocardial infarction prevention and decision was made to continue ASA  LABORATORY TESTING:  Health maintenance labs ordered today as discussed above.   The natural history of prostate cancer and ongoing controversy regarding screening and potential treatment outcomes of prostate cancer has been discussed with the patient. The meaning of a false positive PSA and a false negative PSA has been  discussed. He indicates understanding of the limitations of this screening test and wishes to proceed with screening PSA testing.   IMMUNIZATIONS:   - Tdap: Tetanus vaccination status reviewed: last tetanus booster within 10 years. - Influenza: Up to date at [facility] - Pneumovax: Up to date - Prevnar: Up to date - Zostavax vaccine: Has had one shingles vaccine, the older one, does not want the newer vaccines - Covid vaccines -- Up to date -- had newest booster in November  SCREENING: - Colonoscopy: Up to date  Discussed with patient purpose of the colonoscopy is to detect colon cancer at curable precancerous or early stages   - AAA Screening: Up To Date -- in 2014 -Hearing Test: Not applicable  -Spirometry: Not applicable   PATIENT COUNSELING:    Sexuality: Discussed sexually transmitted diseases, partner selection, use of condoms, avoidance of unintended pregnancy  and contraceptive alternatives.   Advised to avoid cigarette smoking.  I discussed with the patient that most people either abstain from alcohol or drink within safe limits (<=14/week and <=  4 drinks/occasion for males, <=7/weeks and <= 3 drinks/occasion for females) and that the risk for alcohol disorders and other health effects rises proportionally with the number of drinks per week and how often a drinker exceeds daily limits.  Discussed cessation/primary prevention of drug use and availability of treatment for abuse.   Diet: Encouraged to adjust caloric intake to maintain  or achieve ideal body weight, to reduce intake of dietary saturated fat and total fat, to limit sodium intake by avoiding high sodium foods and not adding table salt, and to maintain adequate dietary potassium and calcium preferably from fresh fruits, vegetables, and low-fat dairy products.    Stressed the importance of regular exercise  Injury prevention: Discussed safety belts, safety helmets, smoke detector, smoking near bedding or upholstery.    Dental health: Discussed importance of regular tooth brushing, flossing, and dental visits.   Follow up plan: NEXT PREVENTATIVE PHYSICAL DUE IN 1 YEAR. Return in about 6 months (around 07/01/2022) for HTN/HLD, GERD.

## 2022-01-01 NOTE — Assessment & Plan Note (Signed)
Chronic, ongoing.  Recommend continue Vitamin D supplement and calcium daily.  Educated on this.  Obtain Vit D level today and plan on repeat DEXA in June 2023.

## 2022-01-01 NOTE — Assessment & Plan Note (Signed)
Chronic, stable.  Continue current medication regimen and collaboration with cardiology.  No recent NTG use.

## 2022-01-02 ENCOUNTER — Encounter: Payer: Self-pay | Admitting: Nurse Practitioner

## 2022-01-02 LAB — COMPREHENSIVE METABOLIC PANEL
ALT: 22 IU/L (ref 0–44)
AST: 31 IU/L (ref 0–40)
Albumin/Globulin Ratio: 1.9 (ref 1.2–2.2)
Albumin: 4.2 g/dL (ref 3.7–4.7)
Alkaline Phosphatase: 84 IU/L (ref 44–121)
BUN/Creatinine Ratio: 12 (ref 10–24)
BUN: 12 mg/dL (ref 8–27)
Bilirubin Total: 0.8 mg/dL (ref 0.0–1.2)
CO2: 24 mmol/L (ref 20–29)
Calcium: 10.2 mg/dL (ref 8.6–10.2)
Chloride: 101 mmol/L (ref 96–106)
Creatinine, Ser: 0.98 mg/dL (ref 0.76–1.27)
Globulin, Total: 2.2 g/dL (ref 1.5–4.5)
Glucose: 90 mg/dL (ref 70–99)
Potassium: 4.5 mmol/L (ref 3.5–5.2)
Sodium: 140 mmol/L (ref 134–144)
Total Protein: 6.4 g/dL (ref 6.0–8.5)
eGFR: 81 mL/min/{1.73_m2} (ref >59)

## 2022-01-02 LAB — CBC WITH DIFFERENTIAL/PLATELET
Basophils Absolute: 0 10*3/uL (ref 0.0–0.2)
Basos: 0 %
EOS (ABSOLUTE): 0.2 10*3/uL (ref 0.0–0.4)
Eos: 7 %
Hematocrit: 40.3 % (ref 37.5–51.0)
Hemoglobin: 13.7 g/dL (ref 13.0–17.7)
Immature Grans (Abs): 0 10*3/uL (ref 0.0–0.1)
Immature Granulocytes: 0 %
Lymphocytes Absolute: 0.9 10*3/uL (ref 0.7–3.1)
Lymphs: 33 %
MCH: 31.7 pg (ref 26.6–33.0)
MCHC: 34 g/dL (ref 31.5–35.7)
MCV: 93 fL (ref 79–97)
Monocytes Absolute: 0.4 10*3/uL (ref 0.1–0.9)
Monocytes: 15 %
Neutrophils Absolute: 1.3 10*3/uL — ABNORMAL LOW (ref 1.4–7.0)
Neutrophils: 45 %
Platelets: 143 10*3/uL — ABNORMAL LOW (ref 150–450)
RBC: 4.32 x10E6/uL (ref 4.14–5.80)
RDW: 13 % (ref 11.6–15.4)
WBC: 2.9 10*3/uL — ABNORMAL LOW (ref 3.4–10.8)

## 2022-01-02 LAB — MAGNESIUM: Magnesium: 1.9 mg/dL (ref 1.6–2.3)

## 2022-01-02 LAB — VITAMIN D 25 HYDROXY (VIT D DEFICIENCY, FRACTURES): Vit D, 25-Hydroxy: 54.8 ng/mL (ref 30.0–100.0)

## 2022-01-02 LAB — LIPID PANEL W/O CHOL/HDL RATIO
Cholesterol, Total: 134 mg/dL (ref 100–199)
HDL: 62 mg/dL (ref >39)
LDL Chol Calc (NIH): 59 mg/dL (ref 0–99)
Triglycerides: 62 mg/dL (ref 0–149)
VLDL Cholesterol Cal: 13 mg/dL (ref 5–40)

## 2022-01-02 LAB — TSH: TSH: 1.63 u[IU]/mL (ref 0.450–4.500)

## 2022-01-02 LAB — VITAMIN B12: Vitamin B-12: 1474 pg/mL — ABNORMAL HIGH (ref 232–1245)

## 2022-01-02 NOTE — Progress Notes (Signed)
Contacted via MyChart   Good morning Shawn Shepard, all labs have returned except magnesium.  I will alert you if this is abnormal.  Overall labs are remaining stable for you.  Vitamin B12 is a little on higher side, you could cut back to 500 MCG daily or change to 1000 MCG every other day to help maintain level.  Any questions for me?  Continue all other medications.  Keep being amazing!!  Thank you for allowing me to participate in your care.  I appreciate you. Kindest regards, Jinnie Onley

## 2022-01-28 ENCOUNTER — Other Ambulatory Visit: Payer: Self-pay | Admitting: Nurse Practitioner

## 2022-01-28 DIAGNOSIS — I2583 Coronary atherosclerosis due to lipid rich plaque: Secondary | ICD-10-CM

## 2022-01-28 DIAGNOSIS — I2510 Atherosclerotic heart disease of native coronary artery without angina pectoris: Secondary | ICD-10-CM

## 2022-01-28 DIAGNOSIS — E7800 Pure hypercholesterolemia, unspecified: Secondary | ICD-10-CM

## 2022-01-29 NOTE — Telephone Encounter (Signed)
Call to pharmacy- Rx are on file- will refill for patient.  Rx 01/01/2022 Requested Prescriptions  Pending Prescriptions Disp Refills   metoprolol succinate (TOPROL-XL) 25 MG 24 hr tablet [Pharmacy Med Name: METOPROLOL ER SUCCINATE 25MG  TABS] 90 tablet 4    Sig: TAKE 1 TABLET(25 MG) BY MOUTH DAILY     Cardiovascular:  Beta Blockers Passed - 01/28/2022  6:32 AM      Passed - Last BP in normal range    BP Readings from Last 1 Encounters:  01/01/22 96/64         Passed - Last Heart Rate in normal range    Pulse Readings from Last 1 Encounters:  01/01/22 62         Passed - Valid encounter within last 6 months    Recent Outpatient Visits          4 weeks ago Thrombocytopenia ([place])   [place], [place] T, NP   1 month ago Acute non-recurrent frontal sinusitis   [facility] Jon Billings, NP   5 months ago [place], Santiago Glad, NP   8 months ago Thrombocytopenia ([place])   [place], [place] T, NP   1 year ago Ventricular tachycardia ([place])   [place]in, [place] T, NP      Future Appointments            In 5 months Cannady, Barbaraann Faster, NP [place], [facility]            niacin (NIASPAN) 1000 MG CR tablet [Pharmacy Med Name: NIACIN ER 1000MG  TABLETS] 90 tablet 4    Sig: TAKE 1 TABLET(1000 MG) BY MOUTH AT BEDTIME     Cardiovascular:  Antilipid - niacin Failed - 01/28/2022  6:32 AM      Failed - Lipid Panel in normal range within the last 12 months    Cholesterol, Total  Date Value Ref Range Status  01/01/2022 134 100 - 199 mg/dL Final   Cholesterol Piccolo, Waived  Date Value Ref Range Status  05/31/2019 136 <200 mg/dL Final    Comment:                            Desirable                <200                         Borderline High      200- 239                         High                     >239    LDL Chol Calc (NIH)  Date Value Ref Range  Status  01/01/2022 59 0 - 99 mg/dL Final   HDL  Date Value Ref Range Status  01/01/2022 62 >39 mg/dL Final   Triglycerides  Date Value Ref Range Status  01/01/2022 62 0 - 149 mg/dL Final   Triglycerides Piccolo,Waived  Date Value Ref Range Status  05/31/2019 87 <150 mg/dL Final    Comment:                            Normal                   <  150                         Borderline High     150 - 199                         High                200 - 499                         Very High                >499          Passed - AST in normal range and within 180 days    AST  Date Value Ref Range Status  01/01/2022 31 0 - 40 IU/L Final   AST (SGOT) Piccolo, Waived  Date Value Ref Range Status  05/31/2019 32 11 - 38 U/L Final         Passed - ALT in normal range and within 180 days    ALT  Date Value Ref Range Status  01/01/2022 22 0 - 44 IU/L Final   ALT (SGPT) Piccolo, Waived  Date Value Ref Range Status  05/31/2019 24 10 - 47 U/L Final         Passed - Valid encounter within last 12 months    Recent Outpatient Visits          4 weeks ago Thrombocytopenia ([place])   [place], [place] T, NP   1 month ago Acute non-recurrent frontal sinusitis   [place], NP   5 months ago [place], Santiago Glad, NP   8 months ago Thrombocytopenia ([place])   [place], Henrine Screws T, NP   1 year ago Ventricular tachycardia ([place])   [place], Barbaraann Faster, NP      Future Appointments            In 5 months Cannady, Barbaraann Faster, NP [employer], [facility]

## 2022-01-31 DIAGNOSIS — H02834 Dermatochalasis of left upper eyelid: Secondary | ICD-10-CM | POA: Diagnosis not present

## 2022-01-31 DIAGNOSIS — H02423 Myogenic ptosis of bilateral eyelids: Secondary | ICD-10-CM | POA: Diagnosis not present

## 2022-01-31 DIAGNOSIS — H53483 Generalized contraction of visual field, bilateral: Secondary | ICD-10-CM | POA: Diagnosis not present

## 2022-01-31 DIAGNOSIS — H02831 Dermatochalasis of right upper eyelid: Secondary | ICD-10-CM | POA: Diagnosis not present

## 2022-01-31 DIAGNOSIS — H57813 Brow ptosis, bilateral: Secondary | ICD-10-CM | POA: Diagnosis not present

## 2022-01-31 DIAGNOSIS — H0279 Other degenerative disorders of eyelid and periocular area: Secondary | ICD-10-CM | POA: Diagnosis not present

## 2022-01-31 DIAGNOSIS — H02413 Mechanical ptosis of bilateral eyelids: Secondary | ICD-10-CM | POA: Diagnosis not present

## 2022-01-31 DIAGNOSIS — H35379 Puckering of macula, unspecified eye: Secondary | ICD-10-CM | POA: Insufficient documentation

## 2022-02-11 DIAGNOSIS — I2510 Atherosclerotic heart disease of native coronary artery without angina pectoris: Secondary | ICD-10-CM | POA: Diagnosis not present

## 2022-02-11 DIAGNOSIS — I340 Nonrheumatic mitral (valve) insufficiency: Secondary | ICD-10-CM | POA: Diagnosis not present

## 2022-02-11 DIAGNOSIS — I10 Essential (primary) hypertension: Secondary | ICD-10-CM | POA: Diagnosis not present

## 2022-02-11 DIAGNOSIS — R0602 Shortness of breath: Secondary | ICD-10-CM | POA: Diagnosis not present

## 2022-02-11 DIAGNOSIS — I739 Peripheral vascular disease, unspecified: Secondary | ICD-10-CM | POA: Diagnosis not present

## 2022-02-11 DIAGNOSIS — I351 Nonrheumatic aortic (valve) insufficiency: Secondary | ICD-10-CM | POA: Diagnosis not present

## 2022-02-11 DIAGNOSIS — E782 Mixed hyperlipidemia: Secondary | ICD-10-CM | POA: Diagnosis not present

## 2022-02-25 DIAGNOSIS — H53483 Generalized contraction of visual field, bilateral: Secondary | ICD-10-CM | POA: Diagnosis not present

## 2022-03-04 DIAGNOSIS — H02413 Mechanical ptosis of bilateral eyelids: Secondary | ICD-10-CM | POA: Diagnosis not present

## 2022-03-04 DIAGNOSIS — H02834 Dermatochalasis of left upper eyelid: Secondary | ICD-10-CM | POA: Diagnosis not present

## 2022-03-04 DIAGNOSIS — H02831 Dermatochalasis of right upper eyelid: Secondary | ICD-10-CM | POA: Diagnosis not present

## 2022-03-04 DIAGNOSIS — H02423 Myogenic ptosis of bilateral eyelids: Secondary | ICD-10-CM | POA: Diagnosis not present

## 2022-03-12 ENCOUNTER — Ambulatory Visit: Payer: Medicare PPO | Admitting: Nurse Practitioner

## 2022-03-20 ENCOUNTER — Emergency Department: Payer: Medicare PPO

## 2022-03-20 ENCOUNTER — Emergency Department
Admission: EM | Admit: 2022-03-20 | Discharge: 2022-03-20 | Disposition: A | Discharge status: Home / Self Care | Payer: Medicare PPO | Attending: Emergency Medicine | Admitting: Emergency Medicine

## 2022-03-20 ENCOUNTER — Encounter: Payer: Self-pay | Admitting: Emergency Medicine

## 2022-03-20 ENCOUNTER — Other Ambulatory Visit: Payer: Self-pay

## 2022-03-20 DIAGNOSIS — Y9389 Activity, other specified: Secondary | ICD-10-CM | POA: Diagnosis not present

## 2022-03-20 DIAGNOSIS — R0789 Other chest pain: Secondary | ICD-10-CM | POA: Insufficient documentation

## 2022-03-20 DIAGNOSIS — I2510 Atherosclerotic heart disease of native coronary artery without angina pectoris: Secondary | ICD-10-CM | POA: Insufficient documentation

## 2022-03-20 DIAGNOSIS — R002 Palpitations: Secondary | ICD-10-CM | POA: Insufficient documentation

## 2022-03-20 DIAGNOSIS — R000 Tachycardia, unspecified: Secondary | ICD-10-CM | POA: Diagnosis not present

## 2022-03-20 DIAGNOSIS — I10 Essential (primary) hypertension: Secondary | ICD-10-CM | POA: Diagnosis not present

## 2022-03-20 DIAGNOSIS — R079 Chest pain, unspecified: Secondary | ICD-10-CM | POA: Diagnosis not present

## 2022-03-20 LAB — CBC
HCT: 40.1 % (ref 39.0–52.0)
Hemoglobin: 13.1 g/dL (ref 13.0–17.0)
MCH: 31.3 pg (ref 26.0–34.0)
MCHC: 32.7 g/dL (ref 30.0–36.0)
MCV: 95.7 fL (ref 80.0–100.0)
Platelets: 156 10*3/uL (ref 150–400)
RBC: 4.19 MIL/uL — ABNORMAL LOW (ref 4.22–5.81)
RDW: 13.8 % (ref 11.5–15.5)
WBC: 3.8 10*3/uL — ABNORMAL LOW (ref 4.0–10.5)
nRBC: 0 % (ref 0.0–0.2)

## 2022-03-20 LAB — BASIC METABOLIC PANEL
Anion gap: 8 (ref 5–15)
BUN: 15 mg/dL (ref 8–23)
CO2: 26 mmol/L (ref 22–32)
Calcium: 10.3 mg/dL (ref 8.9–10.3)
Chloride: 104 mmol/L (ref 98–111)
Creatinine, Ser: 0.97 mg/dL (ref 0.61–1.24)
GFR, Estimated: >60 mL/min (ref >60)
Glucose, Bld: 113 mg/dL — ABNORMAL HIGH (ref 70–99)
Potassium: 3.8 mmol/L (ref 3.5–5.1)
Sodium: 138 mmol/L (ref 135–145)

## 2022-03-20 LAB — TROPONIN I (HIGH SENSITIVITY)
Troponin I (High Sensitivity): 6 ng/L (ref <18)
Troponin I (High Sensitivity): 6 ng/L (ref <18)

## 2022-03-20 LAB — D-DIMER, QUANTITATIVE: D-Dimer, Quant: 4.51 ug/mL-FEU — ABNORMAL HIGH (ref 0.00–0.50)

## 2022-03-20 MED ORDER — IOHEXOL 350 MG/ML SOLN
75.0000 mL | Freq: Once | INTRAVENOUS | Status: AC | PRN
  Administered 2022-03-20: 75 mL via INTRAVENOUS

## 2022-03-20 NOTE — ED Triage Notes (Signed)
Pt comes into the ED via POV c/o chest pain and tachycardia that occurred while he was playing gold today.  Pt states his HR got up to 117-125.  Pt explains the chest pain was tightness and pressure.  PT states the pain last only 5 minutes and then it stopped.  Pt monitored his HR the rest of this afternoon and he has stayed tachycardic.    Pt  did have some SHOB and mild nausea when the chest pain was happening.  Pt has a h/o ablation due to a HR in the 200's at one point.  Pt has known heart blockages.  ?

## 2022-03-20 NOTE — ED Notes (Signed)
ED Provider at bedside. 

## 2022-03-20 NOTE — ED Provider Notes (Signed)
? ?[facility] ?Provider Note ? ? ? Event Date/Time  ? First MD Initiated Contact with Patient 03/20/22 2018   ?  (approximate) ? ? ?History  ? ?Chest Pain ? ? ?HPI ? ?Shawn Kitchens Sr. is a 76 y.o. male who presents to the ED for evaluation of Chest Pain ?  ?I reviewed outpatient EP cardiology visit from 2019.  History of CAD, HTN, previous nonischemic myopathy since resolved and V. tach. ? ?Patient presents to the ED for evaluation of an episode of chest pain that occurred while playing golf today.  Earlier this afternoon he reports on the 12th hole he developed about 5 minutes of chest tightness after walking [place] the [place] to a green.  Reports his symptoms self resolved after a few minutes and have not recurred. ? ?Coinciding with this, and recurring a few times during the afternoon, where episodes of tachycardia with rates to the 120s as noted by his smart watch.  Reports feeling some occasional palpitations with this, but denies any recurrence of chest pain. ? ?Reports he went to get checked out in an abundance of caution and reports feeling fine now and has no complaints. ? ? ?Physical Exam  ? ?Triage Vital Signs: ?ED Triage Vitals  ?Enc Vitals Group  ?   BP 03/20/22 1728 140/77  ?   Pulse Rate 03/20/22 1728 69  ?   Resp 03/20/22 1728 17  ?   Temp 03/20/22 1728 97.9 ?F (36.6 ?C)  ?   Temp Source 03/20/22 1728 Oral  ?   SpO2 03/20/22 1728 96 %  ?   Weight 03/20/22 1723 196 lb 10.4 oz (89.2 kg)  ?   Height 03/20/22 1723 5' 11.5" (1.816 m)  ?   Head Circumference --   ?   Peak Flow --   ?   Pain Score 03/20/22 1722 1  ?   Pain Loc --   ?   Pain Edu? --   ?   Excl. in [place]? --   ? ? ?Most recent vital signs: ?Vitals:  ? 03/20/22 2130 03/20/22 2200  ?BP: 140/71 139/73  ?Pulse: (!) 59 (!) 59  ?Resp: 16 16  ?Temp:    ?SpO2: 97% 98%  ? ? ?General: Awake, no distress.  Pleasant and conversational.  Ambulatory with normal gait. ?CV:  Good peripheral perfusion.  ?Resp:  Normal effort.  ?Abd:  No  distention.  ?MSK:  No deformity noted.  ?Neuro:  No focal deficits appreciated. ?Other:   ? ? ?ED Results / Procedures / Treatments  ? ?Labs ?(all labs ordered are listed, but only abnormal results are displayed) ?Labs Reviewed  ?BASIC METABOLIC PANEL - Abnormal; Notable for the following components:  ?    Result Value  ? Glucose, Bld 113 (*)   ? All other components within normal limits  ?CBC - Abnormal; Notable for the following components:  ? WBC 3.8 (*)   ? RBC 4.19 (*)   ? All other components within normal limits  ?D-DIMER, QUANTITATIVE - Abnormal; Notable for the following components:  ? D-Dimer, Quant 4.51 (*)   ? All other components within normal limits  ?TROPONIN I (HIGH SENSITIVITY)  ?TROPONIN I (HIGH SENSITIVITY)  ? ? ?EKG ?Sinus rhythm, rate of 68 bpm.  Normal axis and intervals.  No evidence of acute ischemia.  Some stigmata of LVH ? ?RADIOLOGY ?CXR reviewed by me without evidence of acute cardiopulmonary pathology. ?Possible LUL nodule noted ? ?Official radiology report(s): ?DG  Chest 2 View ? ?Result Date: 03/20/2022 ?CLINICAL DATA:  Chest pain, tachycardia EXAM: CHEST - 2 VIEW COMPARISON:  08/10/2017 FINDINGS: Frontal and lateral views of the chest demonstrate an unremarkable cardiac silhouette. No acute airspace disease, effusion, or pneumothorax. Possible 7 mm nodule overlying the left anterior second rib. No acute bony abnormalities. IMPRESSION: 1. No acute intrathoracic process. 2. Possible 7 mm left upper lobe pulmonary nodule. Nonemergent follow-up CT chest could be performed for further evaluation. Electronically Signed   By: Randa Ngo M.D.   On: 03/20/2022 17:47  ? ?CT Angio Chest PE W and/or Wo Contrast ? ?Result Date: 03/20/2022 ?CLINICAL DATA:  Chest pain and tachycardia. EXAM: CT ANGIOGRAPHY CHEST WITH CONTRAST TECHNIQUE: Multidetector CT imaging of the chest was performed using the standard protocol during bolus administration of intravenous contrast. Multiplanar CT image  reconstructions and MIPs were obtained to evaluate the vascular anatomy. RADIATION DOSE REDUCTION: This exam was performed according to the departmental dose-optimization program which includes automated exposure control, adjustment of the mA and/or kV according to patient size and/or use of iterative reconstruction technique. CONTRAST:  57m OMNIPAQUE IOHEXOL 350 MG/ML SOLN COMPARISON:  None. FINDINGS: Cardiovascular: Satisfactory opacification of the pulmonary arteries to the segmental level. No evidence of pulmonary embolism. Normal heart size with marked severity coronary artery calcification. No pericardial effusion. Mediastinum/Nodes: No enlarged mediastinal, hilar, or axillary lymph nodes. Thyroid gland, trachea, and esophagus demonstrate no significant findings. Lungs/Pleura: A 2 mm noncalcified lung nodule is seen within the anterolateral aspect of the right upper lobe (axial CT image 51, CT series 5). There is no evidence of acute infiltrate, pleural effusion or pneumothorax. Upper Abdomen: There is a small hiatal hernia. Musculoskeletal: No chest wall abnormality. No acute or significant osseous findings. Review of the MIP images confirms the above findings. IMPRESSION: 1. No evidence of pulmonary embolism or other acute intrathoracic process. 2. Marked severity coronary artery calcification. 3. 2 mm noncalcified lung nodule within the anterolateral aspect of the right upper lobe. No follow-up needed if patient is low-risk.This recommendation follows the consensus statement: Guidelines for Management of Incidental Pulmonary Nodules Detected on CT Images: From the Fleischner Society 2017; Radiology 2017; 284:228-243. 4. Small hiatal hernia. Electronically Signed   By: TVirgina NorfolkM.D.   On: 03/20/2022 22:26   ? ?PROCEDURES and INTERVENTIONS: ? ?.1-3 Lead EKG Interpretation ?Performed by: SVladimir Crofts MD ?Authorized by: SVladimir Crofts MD  ? ?  Interpretation: normal   ?  ECG rate:  60 ?  ECG rate  assessment: normal   ?  Rhythm: sinus rhythm   ?  Ectopy: none   ?  Conduction: normal   ? ?Medications  ?iohexol (OMNIPAQUE) 350 MG/ML injection 75 mL (75 mLs Intravenous Contrast Given 03/20/22 2214)  ? ? ? ?IMPRESSION / MDM / ASSESSMENT AND PLAN / ED COURSE  ?I reviewed the triage vital signs and the nursing notes. ? ?Pleasant and quite functional 76year old male presents to the ED after an episode of chest pain, without evidence of acute pathology and suitable for outpatient management.  Presents with normal vital signs and examination.  He is asymptomatic here in the ED and look systemically well.  EKG is nonischemic and he has 2 negative troponins.  No signs of ACS.  Normal CBC and metabolic panel.  His D-dimer is elevated and so CTA chest performed and without evidence of acute PE or other cardiopulmonary pathology.  I considered observation admission for this patient but he declines and has good follow-up  with cardiology.  We discussed return precautions. ? ?Clinical Course as of 03/20/22 2348  ?Wed Mar 20, 2022  ?2157 Reassessed.  Remains asymptomatic.  We discussed positive D-dimer and CT scan.  Is agreeable. [DS]  ?2237 Reassessed.  Feeling fine.  We discussed reassuring CT scan.  We discussed 2 mm nodule.  We discussed following up with Dr. Humphrey Rolls in the clinic and return precautions for the ED. [DS]  ?  ?Clinical Course User Index ?[DS] Vladimir Crofts, MD  ? ? ? ?FINAL CLINICAL IMPRESSION(S) / ED DIAGNOSES  ? ?Final diagnoses:  ?Other chest pain  ? ? ? ?Rx / DC Orders  ? ?ED Discharge Orders   ? ? None  ? ?  ? ? ? ?Note:  This document was prepared using Dragon voice recognition software and may include unintentional dictation errors. ?  ?Vladimir Crofts, MD ?03/20/22 2349 ? ?

## 2022-03-21 ENCOUNTER — Inpatient Hospital Stay (HOSPITAL_BASED_OUTPATIENT_CLINIC_OR_DEPARTMENT_OTHER): Payer: Medicare PPO | Admitting: Oncology

## 2022-03-21 ENCOUNTER — Inpatient Hospital Stay: Payer: Medicare PPO | Attending: Oncology

## 2022-03-21 ENCOUNTER — Encounter: Payer: Self-pay | Admitting: Oncology

## 2022-03-21 VITALS — BP 124/65 | HR 60 | Temp 97.7°F | Resp 18 | Wt 197.8 lb

## 2022-03-21 DIAGNOSIS — D709 Neutropenia, unspecified: Secondary | ICD-10-CM | POA: Diagnosis not present

## 2022-03-21 DIAGNOSIS — C9110 Chronic lymphocytic leukemia of B-cell type not having achieved remission: Secondary | ICD-10-CM | POA: Diagnosis not present

## 2022-03-21 DIAGNOSIS — D696 Thrombocytopenia, unspecified: Secondary | ICD-10-CM | POA: Insufficient documentation

## 2022-03-21 DIAGNOSIS — Z7982 Long term (current) use of aspirin: Secondary | ICD-10-CM | POA: Insufficient documentation

## 2022-03-21 DIAGNOSIS — Z87891 Personal history of nicotine dependence: Secondary | ICD-10-CM | POA: Diagnosis not present

## 2022-03-21 DIAGNOSIS — D72820 Lymphocytosis (symptomatic): Secondary | ICD-10-CM

## 2022-03-21 DIAGNOSIS — I2510 Atherosclerotic heart disease of native coronary artery without angina pectoris: Secondary | ICD-10-CM | POA: Insufficient documentation

## 2022-03-21 DIAGNOSIS — Z79899 Other long term (current) drug therapy: Secondary | ICD-10-CM | POA: Diagnosis not present

## 2022-03-21 DIAGNOSIS — R0789 Other chest pain: Secondary | ICD-10-CM | POA: Diagnosis not present

## 2022-03-21 LAB — COMPREHENSIVE METABOLIC PANEL
ALT: 20 U/L (ref 0–44)
AST: 26 U/L (ref 15–41)
Albumin: 3.8 g/dL (ref 3.5–5.0)
Alkaline Phosphatase: 62 U/L (ref 38–126)
Anion gap: 4 — ABNORMAL LOW (ref 5–15)
BUN: 15 mg/dL (ref 8–23)
CO2: 27 mmol/L (ref 22–32)
Calcium: 10 mg/dL (ref 8.9–10.3)
Chloride: 105 mmol/L (ref 98–111)
Creatinine, Ser: 0.9 mg/dL (ref 0.61–1.24)
GFR, Estimated: >60 mL/min (ref >60)
Glucose, Bld: 114 mg/dL — ABNORMAL HIGH (ref 70–99)
Potassium: 4.1 mmol/L (ref 3.5–5.1)
Sodium: 136 mmol/L (ref 135–145)
Total Bilirubin: 1.1 mg/dL (ref 0.3–1.2)
Total Protein: 6.6 g/dL (ref 6.5–8.1)

## 2022-03-21 LAB — CBC WITH DIFFERENTIAL/PLATELET
Abs Immature Granulocytes: 0.01 10*3/uL (ref 0.00–0.07)
Basophils Absolute: 0 10*3/uL (ref 0.0–0.1)
Basophils Relative: 1 %
Eosinophils Absolute: 0.2 10*3/uL (ref 0.0–0.5)
Eosinophils Relative: 8 %
HCT: 40.5 % (ref 39.0–52.0)
Hemoglobin: 13.2 g/dL (ref 13.0–17.0)
Immature Granulocytes: 0 %
Lymphocytes Relative: 29 %
Lymphs Abs: 0.8 10*3/uL (ref 0.7–4.0)
MCH: 31.1 pg (ref 26.0–34.0)
MCHC: 32.6 g/dL (ref 30.0–36.0)
MCV: 95.3 fL (ref 80.0–100.0)
Monocytes Absolute: 0.4 10*3/uL (ref 0.1–1.0)
Monocytes Relative: 14 %
Neutro Abs: 1.3 10*3/uL — ABNORMAL LOW (ref 1.7–7.7)
Neutrophils Relative %: 48 %
Platelets: 144 10*3/uL — ABNORMAL LOW (ref 150–400)
RBC: 4.25 MIL/uL (ref 4.22–5.81)
RDW: 13.9 % (ref 11.5–15.5)
WBC: 2.7 10*3/uL — ABNORMAL LOW (ref 4.0–10.5)
nRBC: 0 % (ref 0.0–0.2)

## 2022-03-21 LAB — TECHNOLOGIST SMEAR REVIEW
Plt Morphology: NORMAL
RBC MORPHOLOGY: NORMAL
WBC MORPHOLOGY: NORMAL

## 2022-03-21 NOTE — Progress Notes (Signed)
?Hematology/Oncology Progress note ?[phone] [phone] ?  ? ? ? ?Patient Care Team: ?Venita Lick, NP as PCP - General (Nurse Practitioner) ?[name], MD as Consulting Physician (Cardiology) ?[name], MD as Consulting Physician (Oncology) ? ?REFERRING PROVIDER: ?Venita Lick, NP  ?CHIEF COMPLAINTS/REASON FOR VISIT:  ?Follow-up for management of leukopenia and thrombocytopenia,  ? ?HISTORY OF PRESENTING ILLNESS:  ?Shawn CALIGIURI Sr. is a  76 y.o.  male with PMH listed below who was referred to me for evaluation of leukopenia. ?Reviewed patient's recent labs. Patient has low total WBC count was 2.6, predominantly absolute lymphocyte 0.6. ?Earlier lab lab done 11/2019 showed WBC 2.8, lymphocyte 0.7, neutrophil 1.4. ? ?Previous lab records reviewed. 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 ?No aggravating or improving factors. ? ?Associated symptoms:  denies fatigue, weight loss, fever, chills, frequent infection.  ?History hepatitis or HIV infection: Denies ?History of chronic liver disease: Denies ?History of blood transfusion: Denies ?Alcohol consumption: Denies ?Diet Vegetarian or Vegan: Denies ?Herbal medication: Denies ? ?Reports feeling quite tired and fatigued recently.  He and his wife recently downsized to a one-story home and he has been moving a lot of furniture's by himself.  He feels it took a week for his knee to recover after the most. ?Denies any unintentional weight loss, fever or chills, night sweating. ?He is very active, walks every day. ?Previous occupation was a Product/process development scientist. ? ?Drinks a cocktail every day.  Usually drinks cocktail with quinine tonic water for lower extremity cramps.  He started to do this after his ventricular tachycardic episodes about 1-1/2 years ago. ?Former smoker, quitting in 1972. ? ? chronic back pain and had a lumbar spine x-ray done on 12/20/2020 which showed multilevel moderate lumbar  degenerative disc disease, spine spondylolisthesis, facet arthropathy ? ?INTERVAL HISTORY ?Shawn Kitchens Sr. is a 76 y.o. male who has above history reviewed by me today presents for follow up visit for management of leukopenia and thrombocytopenia. ?Patient was accompanied by wife. ?Yesterday, he went to emergency room for evaluation of chest pain.  Troponin was negative.  D-dimer was positive, CT chest angiogram showed negative for pulmonary embolism.  CAD.  ER physician recommended observation overnight and patient declined. ?Today he reports feeling well.  No chest pain.  Yesterday chest pain/pressure started when he was playing golf ball and walking uphill.  He drove back and went to ER for evaluation. ?Patient has history of CAD.  He has communicated with his cardiology for further work-up. ? ? ?Review of Systems  ?Constitutional:  Negative for chills, fever, malaise/fatigue and weight loss.  ?HENT:  Negative for sore throat.   ?Eyes:  Negative for redness.  ?Respiratory:  Negative for cough, shortness of breath and wheezing.   ?Cardiovascular:  Positive for chest pain. Negative for palpitations and leg swelling.  ?Gastrointestinal:  Negative for abdominal pain, blood in stool, nausea and vomiting.  ?Genitourinary:  Negative for dysuria.  ?Musculoskeletal:  Negative for myalgias.  ?Skin:  Negative for rash.  ?Neurological:  Negative for dizziness, tingling and tremors.  ?Endo/Heme/Allergies:  Does not bruise/bleed easily.  ?Psychiatric/Behavioral:  Negative for hallucinations.   ? ?MEDICAL HISTORY:  ?Past Medical History:  ?Diagnosis Date  ? Arthritis   ? Basal cell carcinoma 1999  ? basal cell/skin  ? CAD (coronary artery disease)   ? Cataract Cataract surgery 2017  ? GERD (gastroesophageal reflux disease)   ? History of bone marrow biopsy   ? History  of kidney stones 2013  ? passed stone on his own  ? Hyperlipidemia   ? Hypertension   ? Leukopenia   ? Shingles   ? Ventricular tachycardia ([place])    ? ? ?SURGICAL HISTORY: ?Past Surgical History:  ?Procedure Laterality Date  ? CARDIAC CATHETERIZATION N/A 01/16/2016  ? Procedure: Left Heart Cath and Coronary Angiography;  Surgeon: [name], MD;  Location: [facility];  Service: Cardiovascular;  Laterality: N/A;  ? CATARACT EXTRACTION Left 2017  ? COLONOSCOPY WITH PROPOFOL N/A 03/27/2020  ? Procedure: COLONOSCOPY WITH PROPOFOL;  Surgeon: [name], MD;  Location: [facility];  Service: Endoscopy;  Laterality: N/A;  priority 4  ? CORONARY ANGIOPLASTY  2000 and 2003  ? EYE SURGERY  2017  ? Cateract  ? GANGLION CYST EXCISION  1979  ? HAND SURGERY Right 02/08/2021  ? blood clot removal per patient  ? KNEE ARTHROSCOPY WITH MEDIAL MENISECTOMY Left 12/01/2017  ? Procedure: KNEE ARTHROSCOPY WITH MEDIAL MENISECTOMY;  Surgeon: [name], MD;  Location: [facility];  Service: Orthopedics;  Laterality: Left;  Partial  ? LEFT HEART CATH Right 08/07/2017  ? Procedure: Left Heart Cath;  Surgeon: [name], MD;  Location: [facility];  Service: Cardiovascular;  Laterality: Right;  ? [place]  ? POLYPECTOMY  03/27/2020  ? Procedure: POLYPECTOMY;  Surgeon: [name], MD;  Location: [facility];  Service: Endoscopy;;  ? SKIN CANCER EXCISION  1999  ? ear  ? SYNOVECTOMY Left 12/01/2017  ? Procedure: SYNOVECTOMY;  Surgeon: [name], MD;  Location: [facility];  Service: Orthopedics;  Laterality: Left;  Partial  ? V TACH ABLATION N/A 08/14/2017  ? Procedure: V Tach Ablation;  Surgeon: [name], MD;  Location: [facility];  Service: Cardiovascular;  Laterality: N/A;  ? VEIN SURGERY  2000  ? laser  ? ? ?SOCIAL HISTORY: ?Social History  ? ?Socioeconomic History  ? Marital status: Married  ?  Spouse name: Not on file  ? Number of children: 2  ? Years of education: Not on file  ? Highest education level: Master's degree (e.g., MA, MS, MEng, MEd, MSW, MBA)  ?Occupational History  ? Occupation:  retired  ?Tobacco Use  ? Smoking status: Former  ?  Packs/day: 1.00  ?  Years: 5.00  ?  Pack years: 5.00  ?  Types: Cigarettes  ?  Quit date: 06/21/1971  ?  Years since quitting: 50.7  ? Smokeless tobacco: Former  ?  Types: Chew  ?  Quit date: 06/20/1980  ?Vaping Use  ? Vaping Use: Never used  ?Substance and Sexual Activity  ? Alcohol use: Not Currently  ?  Alcohol/week: 0.0 standard drinks  ? Drug use: No  ? Sexual activity: Not Currently  ?Other Topics Concern  ? Not on file  ?Social History Narrative  ? Lives in [place]  ? Retired school principal  ? ?Social Determinants of Health  ? ?Financial Resource Strain: Low Risk   ? Difficulty of Paying Living Expenses: Not hard at all  ?Food Insecurity: No Food Insecurity  ? Worried About Charity fundraiser in the Last Year: Never true  ? Ran Out of Food in the Last Year: Never true  ?Transportation Needs: No Transportation Needs  ? Lack of Transportation (Medical): No  ? Lack of Transportation (Non-Medical): No  ?Physical Activity: Unknown  ? Days of Exercise per Week: Not on file  ? Minutes of  Exercise per Session: 40 min  ?Stress: No Stress Concern Present  ? Feeling of Stress : Not at all  ?Social Connections: Socially Integrated  ? Frequency of Communication with Friends and Family: More than three times a week  ? Frequency of Social Gatherings with Friends and Family: More than three times a week  ? Attends Religious Services: More than 4 times per year  ? Active Member of Clubs or Organizations: Yes  ? Attends Archivist Meetings: More than 4 times per year  ? Marital Status: Married  ?Intimate Partner Violence: Not At Risk  ? Fear of Current or Ex-Partner: No  ? Emotionally Abused: No  ? Physically Abused: No  ? Sexually Abused: No  ? ? ?FAMILY HISTORY: ?Family History  ?Problem Relation Age of Onset  ? Hypertension Mother   ? Heart disease Mother   ? Heart attack Mother   ? Diabetes Mother   ? Alzheimer's disease Father   ? Alzheimer's disease  Brother   ? Congestive Heart Failure Brother   ? Hypertension Brother   ? Other Brother   ?     dementia  ? Heart disease Brother   ? Brain cancer Daughter 63  ? Cancer Daughter   ? Heart disease Son   ? Cancer Matern

## 2022-03-21 NOTE — Progress Notes (Signed)
Patient here for follow up. No new concerns voiced.  °

## 2022-03-23 NOTE — Patient Instructions (Signed)
Dysphagia  Dysphagia is trouble swallowing. This condition occurs when solids and liquids stick in a person's throat on the way down to the stomach, or when food takes longer to get to the stomach than usual. You may have problems swallowing food, liquids, or both. You may also have pain while trying to swallow. It may take you more time and effort to swallow something. What are the causes? This condition may be caused by: Muscle problems. These may make it difficult for you to move food and liquids through the esophagus, which is the tube that connects your mouth to your stomach. Blockages. You may have ulcers, scar tissue, or inflammation that blocks the normal passage of food and liquids. Causes of these problems include: Acid reflux from your stomach into your esophagus (gastroesophageal reflux). Infections. Radiation treatment for cancer. Medicines taken without enough fluids to wash them down into your stomach. Stroke. This can affect the nerves and make it difficult to swallow. Nerve problems. These prevent signals from being sent to the muscles of your esophagus to squeeze (contract) and move what you swallow down to your stomach. Globus pharyngeus. This is a common problem that involves a feeling like something is stuck in your throat or a sense of trouble with swallowing, even though nothing is wrong with the swallowing passages. Certain conditions, such as cerebral palsy or Parkinson's disease. What are the signs or symptoms? Common symptoms of this condition include: A feeling that solids or liquids are stuck in your throat on the way down to the stomach. Pain while swallowing. Coughing or gagging while trying to swallow. Other symptoms include: Food moving back from your stomach to your mouth (regurgitation). Noises coming from your throat. Chest discomfort when swallowing. A feeling of fullness when swallowing. Drooling, especially when the throat is blocked. Heartburn. How  is this diagnosed? This condition may be diagnosed by: Barium swallow X-ray. In this test, you will swallow a white liquid that sticks to the inside of your esophagus. X-ray images are then taken. Endoscopy. In this test, a flexible telescope is inserted down your throat to look at your esophagus and your stomach. CT scans or an MRI. How is this treated? Treatment for dysphagia depends on the cause of this condition: If the dysphagia is caused by acid reflux or infection, medicines may be used. These may include antibiotics or heartburn medicines. If the dysphagia is caused by problems with the muscles, swallowing therapy may be used to help you strengthen your swallowing muscles. You may have to do specific exercises to strengthen the muscles or stretch them. If the dysphagia is caused by a blockage or mass, procedures to remove the blockage may be done. You may need surgery and a feeding tube. You may need to make diet changes. Ask your health care provider for specific instructions. Follow these instructions at home: Medicines Take over-the-counter and prescription medicines only as told by your health care provider. If you were prescribed an antibiotic medicine, take it as told by your health care provider. Do not stop taking the antibiotic even if you start to feel better. Eating and drinking  Make any diet changes as told by your health care provider. Work with a diet and nutrition specialist (dietitian) to create an eating plan that will help you get the nutrients you need in order to stay healthy. Eat soft foods that are easier to swallow. Cut your food into small pieces and eat slowly. Take small bites. Eat and drink only when you   are sitting upright. Do not drink alcohol or caffeine. If you need help quitting, ask your health care provider. General instructions Check your weight every day to make sure you are not losing weight. Do not use any products that contain nicotine or  tobacco. These products include cigarettes, chewing tobacco, and vaping devices, such as e-cigarettes. If you need help quitting, ask your health care provider. Keep all follow-up visits. This is important. Contact a health care provider if: You lose weight because you cannot swallow. You cough when you drink liquids. You cough up partially digested food. Get help right away if: You cannot swallow your saliva. You have shortness of breath, a fever, or both. Your voice is hoarse and you have trouble swallowing. These symptoms may represent a serious problem that is an emergency. Do not wait to see if the symptoms will go away. Get medical help right away. Call your local emergency services (911 in the [place] Do not drive yourself to the hospital. Summary Dysphagia is trouble swallowing. This condition occurs when solids and liquids stick in a person's throat on the way down to the stomach. You may cough or gag while trying to swallow. Dysphagia has many possible causes. Treatment for dysphagia depends on the cause of the condition. Keep all follow-up visits. This is important. This information is not intended to replace advice given to you by your health care provider. Make sure you discuss any questions you have with your health care provider. Document Revised: 07/08/2020 Document Reviewed: 07/08/2020 Elsevier Patient Education  2023 Elsevier Inc.  

## 2022-03-25 ENCOUNTER — Telehealth: Payer: Self-pay | Admitting: *Deleted

## 2022-03-25 ENCOUNTER — Ambulatory Visit: Payer: Medicare PPO | Admitting: Nurse Practitioner

## 2022-03-25 ENCOUNTER — Telehealth: Payer: Self-pay | Admitting: Nurse Practitioner

## 2022-03-25 ENCOUNTER — Encounter: Payer: Self-pay | Admitting: Nurse Practitioner

## 2022-03-25 VITALS — BP 111/62 | HR 62 | Temp 97.9°F | Ht 71.5 in | Wt 200.8 lb

## 2022-03-25 DIAGNOSIS — I2583 Coronary atherosclerosis due to lipid rich plaque: Secondary | ICD-10-CM | POA: Diagnosis not present

## 2022-03-25 DIAGNOSIS — D709 Neutropenia, unspecified: Secondary | ICD-10-CM | POA: Diagnosis not present

## 2022-03-25 DIAGNOSIS — E782 Mixed hyperlipidemia: Secondary | ICD-10-CM | POA: Diagnosis not present

## 2022-03-25 DIAGNOSIS — I351 Nonrheumatic aortic (valve) insufficiency: Secondary | ICD-10-CM | POA: Diagnosis not present

## 2022-03-25 DIAGNOSIS — R079 Chest pain, unspecified: Secondary | ICD-10-CM

## 2022-03-25 DIAGNOSIS — I10 Essential (primary) hypertension: Secondary | ICD-10-CM | POA: Diagnosis not present

## 2022-03-25 DIAGNOSIS — I2510 Atherosclerotic heart disease of native coronary artery without angina pectoris: Secondary | ICD-10-CM

## 2022-03-25 DIAGNOSIS — R1310 Dysphagia, unspecified: Secondary | ICD-10-CM

## 2022-03-25 DIAGNOSIS — D696 Thrombocytopenia, unspecified: Secondary | ICD-10-CM

## 2022-03-25 DIAGNOSIS — I472 Ventricular tachycardia, unspecified: Secondary | ICD-10-CM | POA: Diagnosis not present

## 2022-03-25 DIAGNOSIS — R0602 Shortness of breath: Secondary | ICD-10-CM | POA: Diagnosis not present

## 2022-03-25 DIAGNOSIS — I739 Peripheral vascular disease, unspecified: Secondary | ICD-10-CM | POA: Diagnosis not present

## 2022-03-25 DIAGNOSIS — I340 Nonrheumatic mitral (valve) insufficiency: Secondary | ICD-10-CM | POA: Diagnosis not present

## 2022-03-25 LAB — COMP PANEL: LEUKEMIA/LYMPHOMA

## 2022-03-25 MED ORDER — MONTELUKAST SODIUM 10 MG PO TABS: 10.0000 mg | ORAL_TABLET | Freq: Every day | ORAL | 4 refills | Status: DC

## 2022-03-25 NOTE — Assessment & Plan Note (Addendum)
Chronic, with recent chest pressure episode.  Continue current medication regimen and collaboration with cardiology.  No recent NTG use.  He has further work-up upcoming with cardiology.   ?

## 2022-03-25 NOTE — Telephone Encounter (Signed)
Noted  

## 2022-03-25 NOTE — Telephone Encounter (Signed)
Transition Care Management Unsuccessful Follow-up Telephone Call ? ?Date of discharge and from where:  [facility] 4-19   ? ?Attempts:  2nd Attempt ? ?Reason for unsuccessful TCM follow-up call:  Unable to leave message ? ?Spoke with the office patient is seeing [name] 03-25-2022 at 3:00 for unrelated issue . Not a hospital follow.  ? ?  ?

## 2022-03-25 NOTE — Assessment & Plan Note (Signed)
Continue collaboration with cardiology, no recent use Tambocor. ?

## 2022-03-25 NOTE — Progress Notes (Signed)
? ?BP 111/62   Pulse 62   Temp 97.9 ?F (36.6 ?C) (Oral)   Ht 5' 11.5" (1.816 m)   Wt 200 lb 12.8 oz (91.1 kg)   SpO2 97%   BMI 27.62 kg/m?   ? ?Subjective:  ? ? Patient ID: Shawn Kitchens Sr., male    DOB: 05-29-1946, 76 y.o.   MRN: [id number] ? ?HPI: ?Shawn CAPSHAW Sr. is a 76 y.o. male ? ?Chief Complaint  ?Patient presents with  ? Choking  ?  Patient states he has 4 episodes within the past year of when he eats steak, he starts to choke. Patient states his wife has to perform the Heimlich maneuver to help patient. Patient states he just notices it with steak being how thick and dry. Patient states one episode he felt that the steak was stuck and he had to throw it up.   ? ?ER FOLLOW UP ?Recently attended ER for incident of chest pain.  Was playing golf recently and had episode of chest discomfort + extreme pressure and tightening.  Lasted 5 minutes and HR/breathing was increased.  It subsided -- he went to ER after this.  Troponin levels and imaging stable in ER.  Has had some intermittent SOB with activity recently, last this morning while picking dog up and climbing up steps. ? ?He saw Dr. Chancy Milroy this morning, who feels it may have been A-fib -- has ordered echo and stress test next week, after this may perform catheterization.  Last visit with oncology was 03/21/22 for leukopenia. ?Time since discharge:  ?Hospital/facility: [facility] ?Diagnosis: Chest Pain ?Procedures/tests: labs and imaging ?Consultants: none ?New medications: none ?Discharge instructions:  followed up with cardiology ?Status: stable  ? ?CHOKING EPISODES ?Started last fall, has had 4 episodes in last 9 months.  3 times since the first of the year -- gets stuck in epigastric area and can not get up.  Has noticed with steak or bigger bites of foods.  CT scan in ER did noted small hiatal hernia.  Patient has a lot of allergy issues, takes Sudafed before going out to eat which helps.  He had allergy testing in past, has taken antihistamines his  whole life and did at one take shots.  Wife had to perform Heimlich on him twice. ? ?His father had to have esophagus stretched at his same age.   ?Duration:months ?Onset: gradual ?Frequency: intermittent ?Alleviating factors: unknown ?Aggravating factors: steak, grilled chicken that is dry, occasionally water ?Status: fluctuating ?Treatments attempted: PPI ?Fever: no ?Nausea: no ?Vomiting: no ?Weight loss: no ?Decreased appetite: no ?Diarrhea: no ?Constipation: no ?Blood in stool: no ?Heartburn: no ?Jaundice: no ?Rash: no ?Recurrent NSAID use: no  ? ?Relevant past medical, surgical, family and social history reviewed and updated as indicated. Interim medical history since our last visit reviewed. ?Allergies and medications reviewed and updated. ? ?Review of Systems  ?Constitutional:  Negative for activity change, diaphoresis, fatigue and fever.  ?Respiratory:  Negative for cough, chest tightness, shortness of breath and wheezing.   ?Cardiovascular:  Positive for chest pain. Negative for palpitations and leg swelling.  ?Gastrointestinal:  Negative for abdominal distention, abdominal pain, blood in stool, constipation, diarrhea, nausea and vomiting.  ?Endocrine: Negative.   ?Neurological: Negative.   ?Psychiatric/Behavioral: Negative.    ? ?Per HPI unless specifically indicated above ? ?   ?Objective:  ?  ?BP 111/62   Pulse 62   Temp 97.9 ?F (36.6 ?C) (Oral)   Ht 5' 11.5" (1.816 m)  Wt 200 lb 12.8 oz (91.1 kg)   SpO2 97%   BMI 27.62 kg/m?   ?Wt Readings from Last 3 Encounters:  ?03/25/22 200 lb 12.8 oz (91.1 kg)  ?03/21/22 197 lb 12.8 oz (89.7 kg)  ?03/20/22 196 lb 10.4 oz (89.2 kg)  ?  ?Physical Exam ?Vitals and nursing note reviewed.  ?Constitutional:   ?   General: He is awake. He is not in acute distress. ?   Appearance: He is well-developed and well-groomed. He is not ill-appearing or toxic-appearing.  ?HENT:  ?   Head: Normocephalic and atraumatic.  ?   Right Ear: Hearing normal. No drainage.  ?   Left  Ear: Hearing normal. No drainage.  ?Eyes:  ?   General: Lids are normal.     ?   Right eye: No discharge.     ?   Left eye: No discharge.  ?   Conjunctiva/sclera: Conjunctivae normal.  ?   Pupils: Pupils are equal, round, and reactive to light.  ?Neck:  ?   Thyroid: No thyromegaly.  ?   Vascular: No carotid bruit.  ?Cardiovascular:  ?   Rate and Rhythm: Normal rate and regular rhythm.  ?   Pulses:     ?     Dorsalis pedis pulses are 2+ on the right side and 2+ on the left side.  ?     Posterior tibial pulses are 2+ on the right side and 2+ on the left side.  ?   Heart sounds: Normal heart sounds, S1 normal and S2 normal. No murmur heard. ?  No gallop.  ?   Comments: Significant varicosities to bilateral legs R>L. ?Pulmonary:  ?   Effort: Pulmonary effort is normal. No accessory muscle usage or respiratory distress.  ?   Breath sounds: Normal breath sounds.  ?Abdominal:  ?   General: Bowel sounds are normal.  ?   Palpations: Abdomen is soft. There is no hepatomegaly or splenomegaly.  ?Musculoskeletal:     ?   General: Normal range of motion.  ?   Cervical back: Normal range of motion and neck supple.  ?   Right lower leg: 2+ Edema present.  ?   Left lower leg: 1+ Edema present.  ?Lymphadenopathy:  ?   Cervical: No cervical adenopathy.  ?Skin: ?   General: Skin is warm and dry.  ?   Capillary Refill: Capillary refill takes less than 2 seconds.  ?   Findings: No rash.  ?Neurological:  ?   Mental Status: He is alert and oriented to person, place, and time.  ?   Deep Tendon Reflexes: Reflexes are normal and symmetric.  ?Psychiatric:     ?   Attention and Perception: Attention normal.     ?   Mood and Affect: Mood normal.     ?   Speech: Speech normal.     ?   Behavior: Behavior normal. Behavior is cooperative.     ?   Thought Content: Thought content normal.  ? ?Results for orders placed or performed in visit on 03/21/22  ?Flow cytometry panel-leukemia/lymphoma work-up  ?Result Value Ref Range  ? PATH INTERP XXX-IMP  Comment   ? ANNOTATION COMMENT IMP Comment   ? CLINICAL INFO Comment   ? Specimen Type Comment   ? ASSESSMENT OF LEUKOCYTES Comment   ? % Viable Cells Comment   ? Immunophenotypic Profile Comment   ? ANALYSIS AND GATING STRATEGY Comment   ? IMMUNOPHENOTYPING STUDY Comment   ?  PATHOLOGIST NAME Comment   ? COMMENT: Comment   ?Comprehensive metabolic panel  ?Result Value Ref Range  ? Sodium 136 135 - 145 mmol/L  ? Potassium 4.1 3.5 - 5.1 mmol/L  ? Chloride 105 98 - 111 mmol/L  ? CO2 27 22 - 32 mmol/L  ? Glucose, Bld 114 (H) 70 - 99 mg/dL  ? BUN 15 8 - 23 mg/dL  ? Creatinine, Ser 0.90 0.61 - 1.24 mg/dL  ? Calcium 10.0 8.9 - 10.3 mg/dL  ? Total Protein 6.6 6.5 - 8.1 g/dL  ? Albumin 3.8 3.5 - 5.0 g/dL  ? AST 26 15 - 41 U/L  ? ALT 20 0 - 44 U/L  ? Alkaline Phosphatase 62 38 - 126 U/L  ? Total Bilirubin 1.1 0.3 - 1.2 mg/dL  ? GFR, Estimated >60 >60 mL/min  ? Anion gap 4 (L) 5 - 15  ?CBC with Differential/Platelet  ?Result Value Ref Range  ? WBC 2.7 (L) 4.0 - 10.5 K/uL  ? RBC 4.25 4.22 - 5.81 MIL/uL  ? Hemoglobin 13.2 13.0 - 17.0 g/dL  ? HCT 40.5 39.0 - 52.0 %  ? MCV 95.3 80.0 - 100.0 fL  ? MCH 31.1 26.0 - 34.0 pg  ? MCHC 32.6 30.0 - 36.0 g/dL  ? RDW 13.9 11.5 - 15.5 %  ? Platelets 144 (L) 150 - 400 K/uL  ? nRBC 0.0 0.0 - 0.2 %  ? Neutrophils Relative % 48 %  ? Neutro Abs 1.3 (L) 1.7 - 7.7 K/uL  ? Lymphocytes Relative 29 %  ? Lymphs Abs 0.8 0.7 - 4.0 K/uL  ? Monocytes Relative 14 %  ? Monocytes Absolute 0.4 0.1 - 1.0 K/uL  ? Eosinophils Relative 8 %  ? Eosinophils Absolute 0.2 0.0 - 0.5 K/uL  ? Basophils Relative 1 %  ? Basophils Absolute 0.0 0.0 - 0.1 K/uL  ? Immature Granulocytes 0 %  ? Abs Immature Granulocytes 0.01 0.00 - 0.07 K/uL  ?Technologist smear review  ?Result Value Ref Range  ? WBC MORPHOLOGY Normal RBC, WBC, and platelet   ? RBC MORPHOLOGY Normal RBC, WBC, and platelet   ? Tech Review Normal RBC, WBC, and platelet   ? ?   ?Assessment & Plan:  ? ?Problem List Items Addressed This Visit   ? ?  ? Cardiovascular  and Mediastinum  ? CAD (coronary artery disease)  ?  Chronic, with recent chest pressure episode.  Continue current medication regimen and collaboration with cardiology.  No recent NTG use.  He has further work-up upcom

## 2022-03-25 NOTE — Assessment & Plan Note (Signed)
Continue collaboration with hematology, recent note and labs reviewed. ?

## 2022-03-25 NOTE — Assessment & Plan Note (Signed)
Ongoing for months with worsening episodes.  Urgent referral to GI placed.  Continue Protonix at this time for GERD symptoms.  Recommend cutting food up in smaller pieces and eat softer food until further assessment.  Avoid drier cuts meat.  Ensure plenty of hydration with meals and eat slower with no talking while chewing.   ?

## 2022-03-25 NOTE — Telephone Encounter (Signed)
Copied from [place]. Topic: General - Inquiry ?>> Mar 25, 2022  2:02 PM Robina Ade, Helene Kelp D wrote: ?Reason for CRM: Leroy Kennedy called and would like to let [name] know that she has tried to contact patient multiple times but could not get a hold of him to try and schedule a hospital f/u. Patient was seen for chest pain. When he comes to his office appointment today can someone please schedule that for him, thank you. ?

## 2022-03-25 NOTE — Assessment & Plan Note (Signed)
Continue collaboration with hematology, recent labs and note reviewed. ?

## 2022-03-25 NOTE — Assessment & Plan Note (Signed)
Chronic, stable with BP at goal in office today.  Recommend he monitor BP at least a few mornings a week at home and document.  DASH diet at home.  Continue current medication regimen and adjust as needed.  Labs today: CBC, CMP, TSH.   ? ?

## 2022-03-26 LAB — COMPREHENSIVE METABOLIC PANEL
ALT: 21 IU/L (ref 0–44)
AST: 29 IU/L (ref 0–40)
Albumin/Globulin Ratio: 2.1 (ref 1.2–2.2)
Albumin: 4.1 g/dL (ref 3.7–4.7)
Alkaline Phosphatase: 73 IU/L (ref 44–121)
BUN/Creatinine Ratio: 11 (ref 10–24)
BUN: 9 mg/dL (ref 8–27)
Bilirubin Total: 0.7 mg/dL (ref 0.0–1.2)
CO2: 25 mmol/L (ref 20–29)
Calcium: 10.3 mg/dL — ABNORMAL HIGH (ref 8.6–10.2)
Chloride: 104 mmol/L (ref 96–106)
Creatinine, Ser: 0.79 mg/dL (ref 0.76–1.27)
Globulin, Total: 2 g/dL (ref 1.5–4.5)
Glucose: 100 mg/dL — ABNORMAL HIGH (ref 70–99)
Potassium: 4.5 mmol/L (ref 3.5–5.2)
Sodium: 140 mmol/L (ref 134–144)
Total Protein: 6.1 g/dL (ref 6.0–8.5)
eGFR: 93 mL/min/{1.73_m2} (ref >59)

## 2022-03-26 LAB — CBC WITH DIFFERENTIAL/PLATELET
Basophils Absolute: 0 10*3/uL (ref 0.0–0.2)
Basos: 1 %
EOS (ABSOLUTE): 0.2 10*3/uL (ref 0.0–0.4)
Eos: 7 %
Hematocrit: 37.2 % — ABNORMAL LOW (ref 37.5–51.0)
Hemoglobin: 13 g/dL (ref 13.0–17.7)
Immature Grans (Abs): 0 10*3/uL (ref 0.0–0.1)
Immature Granulocytes: 0 %
Lymphocytes Absolute: 0.9 10*3/uL (ref 0.7–3.1)
Lymphs: 31 %
MCH: 31.9 pg (ref 26.6–33.0)
MCHC: 34.9 g/dL (ref 31.5–35.7)
MCV: 91 fL (ref 79–97)
Monocytes Absolute: 0.4 10*3/uL (ref 0.1–0.9)
Monocytes: 16 %
Neutrophils Absolute: 1.3 10*3/uL — ABNORMAL LOW (ref 1.4–7.0)
Neutrophils: 45 %
Platelets: 160 10*3/uL (ref 150–450)
RBC: 4.08 x10E6/uL — ABNORMAL LOW (ref 4.14–5.80)
RDW: 13.1 % (ref 11.6–15.4)
WBC: 2.8 10*3/uL — ABNORMAL LOW (ref 3.4–10.8)

## 2022-03-26 LAB — TSH: TSH: 1.91 u[IU]/mL (ref 0.450–4.500)

## 2022-03-26 NOTE — Progress Notes (Signed)
Contacted via [place] ? ? ?Good evening Shawn Shepard, your labs have returned.  Kidney function, creatinine and eGFR, remains normal, as is liver function, AST and ALT.  CBC continues to show baseline levels for you.  Thyroid lab is normal.  Overall stable labs.  Let me know if any questions.:) ?Keep being amazing!!  Thank you for allowing me to participate in your care.  I appreciate you. ?Kindest regards, ?Marlenne Ridge ? ?

## 2022-03-28 ENCOUNTER — Other Ambulatory Visit: Payer: Self-pay

## 2022-03-28 ENCOUNTER — Ambulatory Visit: Payer: Medicare PPO | Admitting: Gastroenterology

## 2022-03-28 ENCOUNTER — Encounter: Payer: Self-pay | Admitting: Gastroenterology

## 2022-03-28 VITALS — BP 123/68 | HR 67 | Temp 98.7°F | Ht 71.0 in | Wt 198.2 lb

## 2022-03-28 DIAGNOSIS — R1310 Dysphagia, unspecified: Secondary | ICD-10-CM

## 2022-03-28 NOTE — Progress Notes (Signed)
?  ?Jonathon Bellows MD, MRCP(U.K) ?[address]  ?[address]  ?[place], [place] [address]  ?Main: [phone]  ?Fax: [phone] ? ? ?Gastroenterology Consultation ? ?Referring Provider:     Venita Lick, NP ?Primary Care Physician:  Venita Lick, NP ?Primary Gastroenterologist:  Dr. Jonathon Bellows  ?Reason for Consultation:     Dysphagia ?      ? HPI:   ?Shawn MARLEY Sr. is a 76 y.o. y/o male referred for consultation & management  by  Venita Lick, NP.   ? ? ? ?Dysphagia: ?Onset and any progression: 9 months ?Frequency: 4 episodes of food choking required the Heimlich maneuver by his wife ?Foods affected : On all occasions has been steak ?Prior episodes of impaction: Yes but did not require to come to the hospital ?History of asthma/allergy : Yes has had allergies ?History of heartburn/Reflux : On medications for reflux ?Weight loss/weight gain : None ?Prior EGD: None ?PPI/H2 blocker use : Yes ?Smoking history :.  None ?Father had an esophageal stricture, he is not on any blood thinners ? ? ?Past Medical History:  ?Diagnosis Date  ? Arthritis   ? Basal cell carcinoma 1999  ? basal cell/skin  ? CAD (coronary artery disease)   ? Cataract Cataract surgery 2017  ? GERD (gastroesophageal reflux disease)   ? History of bone marrow biopsy   ? History of kidney stones 2013  ? passed stone on his own  ? Hyperlipidemia   ? Hypertension   ? Leukopenia   ? Shingles   ? Ventricular tachycardia ([place])   ? ? ?Past Surgical History:  ?Procedure Laterality Date  ? CARDIAC CATHETERIZATION N/A 01/16/2016  ? Procedure: Left Heart Cath and Coronary Angiography;  Surgeon: [name], MD;  Location: [facility];  Service: Cardiovascular;  Laterality: N/A;  ? CATARACT EXTRACTION Left 2017  ? COLONOSCOPY WITH PROPOFOL N/A 03/27/2020  ? Procedure: COLONOSCOPY WITH PROPOFOL;  Surgeon: [name], MD;  Location: [facility];  Service: Endoscopy;  Laterality: N/A;  priority 4  ? CORONARY ANGIOPLASTY  2000  and 2003  ? EYE SURGERY  2017  ? Cateract  ? GANGLION CYST EXCISION  1979  ? HAND SURGERY Right 02/08/2021  ? blood clot removal per patient  ? KNEE ARTHROSCOPY WITH MEDIAL MENISECTOMY Left 12/01/2017  ? Procedure: KNEE ARTHROSCOPY WITH MEDIAL MENISECTOMY;  Surgeon: [name], MD;  Location: [facility];  Service: Orthopedics;  Laterality: Left;  Partial  ? LEFT HEART CATH Right 08/07/2017  ? Procedure: Left Heart Cath;  Surgeon: [name], MD;  Location: [facility];  Service: Cardiovascular;  Laterality: Right;  ? [place]  ? POLYPECTOMY  03/27/2020  ? Procedure: POLYPECTOMY;  Surgeon: [name], MD;  Location: [facility];  Service: Endoscopy;;  ? SKIN CANCER EXCISION  1999  ? ear  ? SYNOVECTOMY Left 12/01/2017  ? Procedure: SYNOVECTOMY;  Surgeon: [name], MD;  Location: [facility];  Service: Orthopedics;  Laterality: Left;  Partial  ? V TACH ABLATION N/A 08/14/2017  ? Procedure: V Tach Ablation;  Surgeon: [name], MD;  Location: [facility];  Service: Cardiovascular;  Laterality: N/A;  ? VEIN SURGERY  2000  ? laser  ? ? ?Prior to Admission medications   ?Medication Sig Start Date End Date Taking? Authorizing Provider  ?acetaminophen (TYLENOL) 500 MG tablet Take 1,000 mg by mouth every 6 (six) hours as needed for  moderate pain or headache.    [provider]  ?ALLERGY RELIEF 180 MG tablet TAKE 1 TABLET BY MOUTH EVERY DAY 02/13/21   Marnee Guarneri T, NP  ?aspirin EC 81 MG tablet Take 81 mg by mouth daily.    [provider]  ?atorvastatin (LIPITOR) 80 MG tablet Take 1 tablet (80 mg total) by mouth at bedtime. 01/01/22   Venita Lick, NP  ?Cholecalciferol (VITAMIN D3) 50 MCG (2000 UT) CHEW Chew 1 tablet by mouth daily.    [provider]  ?flecainide (TAMBOCOR) 100 MG tablet Take 2 tablets (200 mg total) as needed by mouth. As needed for recurrent tachycardia.  Do not repeat more than once per day or 3x per wk  10/13/17   [name], [name], MD  ?furosemide (LASIX) 20 MG tablet Take 20 mg by mouth.    [provider]  ?metoprolol succinate (TOPROL-XL) 25 MG 24 hr tablet Take 1 tablet (25 mg total) by mouth daily. 01/01/22   Cannady, Henrine Screws T, NP  ?montelukast (SINGULAIR) 10 MG tablet Take 1 tablet (10 mg total) by mouth at bedtime. 03/25/22   Marnee Guarneri T, NP  ?niacin (NIASPAN) 1000 MG CR tablet TAKE 1 TABLET(1000 MG) BY MOUTH AT BEDTIME 01/01/22   Cannady, Henrine Screws T, NP  ?nitroGLYCERIN (NITROSTAT) 0.4 MG SL tablet nitroglycerin 0.4 mg subl ?Patient not taking: Reported on 03/25/2022    [provider]  ?pantoprazole (PROTONIX) 40 MG tablet TAKE 1 TABLET(40 MG) BY MOUTH DAILY 01/01/22   Cannady, Henrine Screws T, NP  ?ramipril (ALTACE) 10 MG capsule Take 10 mg by mouth daily.    [provider]  ?vitamin B-12 (CYANOCOBALAMIN) 1000 MCG tablet Take 1 tablet (1,000 mcg total) by mouth daily. 01/12/19   [name], MD  ? ? ?Family History  ?Problem Relation Age of Onset  ? Hypertension Mother   ? Heart disease Mother   ? Heart attack Mother   ? Diabetes Mother   ? Alzheimer's disease Father   ? Alzheimer's disease Brother   ? Congestive Heart Failure Brother   ? Hypertension Brother   ? Other Brother   ?     dementia  ? Heart disease Brother   ? Brain cancer Daughter 48  ? Cancer Daughter   ? Heart disease Son   ? Cancer Maternal Uncle   ? Cancer Maternal Grandfather   ?  ? ?Social History  ? ?Tobacco Use  ? Smoking status: Former  ?  Packs/day: 1.00  ?  Years: 5.00  ?  Pack years: 5.00  ?  Types: Cigarettes  ?  Quit date: 06/21/1971  ?  Years since quitting: 50.8  ? Smokeless tobacco: Former  ?  Types: Chew  ?  Quit date: 06/20/1980  ?Vaping Use  ? Vaping Use: Never used  ?Substance Use Topics  ? Alcohol use: Not Currently  ?  Alcohol/week: 0.0 standard drinks  ? Drug use: No  ? ? ?Allergies as of 03/28/2022  ? (No Known Allergies)  ? ? ?Review of Systems:    ?All systems reviewed and negative except where noted in  HPI. ? ? Physical Exam:  ?BP 123/68   Pulse 67   Temp 98.7 ?F (37.1 ?C) (Oral)   Ht 5' 11" (1.803 m)   Wt 198 lb 3.2 oz (89.9 kg)   BMI 27.64 kg/m?  ?No LMP for male patient. ?Psych:  Alert and cooperative. Normal mood and affect. ?General:   Alert,  Well-developed, well-nourished, pleasant and cooperative  in NAD ?Head:  Normocephalic and atraumatic. ?Eyes:  Sclera clear, no icterus.   Conjunctiva pink. ?Ears:  Normal auditory acuity. ?Neurologic:  Alert and oriented x3;  grossly normal neurologically. ?Psych:  Alert and cooperative. Normal mood and affect. ? ?Imaging Studies: ?DG Chest 2 View ? ?Result Date: 03/20/2022 ?CLINICAL DATA:  Chest pain, tachycardia EXAM: CHEST - 2 VIEW COMPARISON:  08/10/2017 FINDINGS: Frontal and lateral views of the chest demonstrate an unremarkable cardiac silhouette. No acute airspace disease, effusion, or pneumothorax. Possible 7 mm nodule overlying the left anterior second rib. No acute bony abnormalities. IMPRESSION: 1. No acute intrathoracic process. 2. Possible 7 mm left upper lobe pulmonary nodule. Nonemergent follow-up CT chest could be performed for further evaluation. Electronically Signed   By: Randa Ngo M.D.   On: 03/20/2022 17:47  ? ?CT Angio Chest PE W and/or Wo Contrast ? ?Result Date: 03/20/2022 ?CLINICAL DATA:  Chest pain and tachycardia. EXAM: CT ANGIOGRAPHY CHEST WITH CONTRAST TECHNIQUE: Multidetector CT imaging of the chest was performed using the standard protocol during bolus administration of intravenous contrast. Multiplanar CT image reconstructions and MIPs were obtained to evaluate the vascular anatomy. RADIATION DOSE REDUCTION: This exam was performed according to the departmental dose-optimization program which includes automated exposure control, adjustment of the mA and/or kV according to patient size and/or use of iterative reconstruction technique. CONTRAST:  38m OMNIPAQUE IOHEXOL 350 MG/ML SOLN COMPARISON:  None. FINDINGS: Cardiovascular:  Satisfactory opacification of the pulmonary arteries to the segmental level. No evidence of pulmonary embolism. Normal heart size with marked severity coronary artery calcification. No pericardial effusion. Me

## 2022-04-01 ENCOUNTER — Encounter
Admission: RE | Disposition: A | Discharge status: Home / Self Care | Payer: Self-pay | Source: Home / Self Care | Attending: Gastroenterology

## 2022-04-01 ENCOUNTER — Ambulatory Visit: Payer: Medicare PPO | Admitting: Registered Nurse

## 2022-04-01 ENCOUNTER — Encounter: Payer: Self-pay | Admitting: Gastroenterology

## 2022-04-01 ENCOUNTER — Ambulatory Visit
Admission: RE | Admit: 2022-04-01 | Discharge: 2022-04-01 | Disposition: A | Discharge status: Home / Self Care | Payer: Medicare PPO | Attending: Gastroenterology | Admitting: Gastroenterology

## 2022-04-01 DIAGNOSIS — Z85828 Personal history of other malignant neoplasm of skin: Secondary | ICD-10-CM | POA: Diagnosis not present

## 2022-04-01 DIAGNOSIS — K2289 Other specified disease of esophagus: Secondary | ICD-10-CM | POA: Insufficient documentation

## 2022-04-01 DIAGNOSIS — Z87891 Personal history of nicotine dependence: Secondary | ICD-10-CM | POA: Diagnosis not present

## 2022-04-01 DIAGNOSIS — I10 Essential (primary) hypertension: Secondary | ICD-10-CM | POA: Diagnosis not present

## 2022-04-01 DIAGNOSIS — I2510 Atherosclerotic heart disease of native coronary artery without angina pectoris: Secondary | ICD-10-CM | POA: Insufficient documentation

## 2022-04-01 DIAGNOSIS — E785 Hyperlipidemia, unspecified: Secondary | ICD-10-CM | POA: Diagnosis not present

## 2022-04-01 DIAGNOSIS — K219 Gastro-esophageal reflux disease without esophagitis: Secondary | ICD-10-CM | POA: Diagnosis not present

## 2022-04-01 DIAGNOSIS — K200 Eosinophilic esophagitis: Secondary | ICD-10-CM | POA: Diagnosis not present

## 2022-04-01 DIAGNOSIS — R1310 Dysphagia, unspecified: Secondary | ICD-10-CM | POA: Diagnosis not present

## 2022-04-01 HISTORY — PX: ESOPHAGOGASTRODUODENOSCOPY (EGD) WITH PROPOFOL: SHX5813

## 2022-04-01 SURGERY — ESOPHAGOGASTRODUODENOSCOPY (EGD) WITH PROPOFOL
Anesthesia: General

## 2022-04-01 MED ORDER — PROPOFOL 500 MG/50ML IV EMUL
INTRAVENOUS | Status: DC | PRN
  Administered 2022-04-01: 150 ug/kg/min via INTRAVENOUS

## 2022-04-01 MED ORDER — PROPOFOL 10 MG/ML IV BOLUS
INTRAVENOUS | Status: DC | PRN
  Administered 2022-04-01: 80 mg via INTRAVENOUS

## 2022-04-01 MED ORDER — SODIUM CHLORIDE 0.9 % IV SOLN: INTRAVENOUS | Status: DC

## 2022-04-01 MED ORDER — LIDOCAINE HCL (CARDIAC) PF 100 MG/5ML IV SOSY
PREFILLED_SYRINGE | INTRAVENOUS | Status: DC | PRN
  Administered 2022-04-01: 40 mg via INTRAVENOUS

## 2022-04-01 NOTE — Transfer of Care (Signed)
Immediate Anesthesia Transfer of Care Note ? ?Patient: Shawn Shepard. ? ?Procedure(s) Performed: Procedure(s): ?ESOPHAGOGASTRODUODENOSCOPY (EGD) WITH PROPOFOL (N/A) ? ?Patient Location: PACU and Endoscopy Unit ? ?Anesthesia Type:General ? ?Level of Consciousness: sedated ? ?Airway & Oxygen Therapy: Patient Spontanous Breathing and Patient connected to nasal cannula oxygen ? ?Post-op Assessment: Report given to RN and Post -op Vital signs reviewed and stable ? ?Post vital signs: Reviewed and stable ? ?Last Vitals:  ?Vitals:  ? 04/01/22 0941 04/01/22 1109  ?BP: 124/81 111/72  ?Pulse: 60 (!) 57  ?Resp: 18 19  ?Temp: (!) 36.1 ?C (!) 35.9 ?C  ?SpO2: 99% 96%  ? ? ?Complications: No apparent anesthesia complications ?

## 2022-04-01 NOTE — H&P (Signed)
? ? ? ?Jonathon Bellows, MD ?[address] [place], [place], [place], [address] ?[address], [place], [place], [place], [address] ?Phone: [phone]  ?Fax: [phone] ? ?Primary Care Physician:  Venita Lick, NP ? ? ?Pre-Procedure History & Physical: ?HPI:  Shawn DILKS Sr. is a 76 y.o. male is here for an endoscopy  ?  ?Past Medical History:  ?Diagnosis Date  ? Arthritis   ? Basal cell carcinoma 1999  ? basal cell/skin  ? CAD (coronary artery disease)   ? Cataract Cataract surgery 2017  ? GERD (gastroesophageal reflux disease)   ? History of bone marrow biopsy   ? History of kidney stones 2013  ? passed stone on his own  ? Hyperlipidemia   ? Hypertension   ? Leukopenia   ? Shingles   ? Ventricular tachycardia ([place])   ? ? ?Past Surgical History:  ?Procedure Laterality Date  ? CARDIAC CATHETERIZATION N/A 01/16/2016  ? Procedure: Left Heart Cath and Coronary Angiography;  Surgeon: [name], MD;  Location: [facility];  Service: Cardiovascular;  Laterality: N/A;  ? CATARACT EXTRACTION Left 2017  ? COLONOSCOPY WITH PROPOFOL N/A 03/27/2020  ? Procedure: COLONOSCOPY WITH PROPOFOL;  Surgeon: [name], MD;  Location: [facility];  Service: Endoscopy;  Laterality: N/A;  priority 4  ? CORONARY ANGIOPLASTY  2000 and 2003  ? EYE SURGERY  2017  ? Cateract  ? GANGLION CYST EXCISION  1979  ? HAND SURGERY Right 02/08/2021  ? blood clot removal per patient  ? KNEE ARTHROSCOPY WITH MEDIAL MENISECTOMY Left 12/01/2017  ? Procedure: KNEE ARTHROSCOPY WITH MEDIAL MENISECTOMY;  Surgeon: [name], MD;  Location: [facility];  Service: Orthopedics;  Laterality: Left;  Partial  ? LEFT HEART CATH Right 08/07/2017  ? Procedure: Left Heart Cath;  Surgeon: [name], MD;  Location: [facility];  Service: Cardiovascular;  Laterality: Right;  ? [place]  ? POLYPECTOMY  03/27/2020  ? Procedure: POLYPECTOMY;  Surgeon: [name], MD;  Location: [facility];   Service: Endoscopy;;  ? SKIN CANCER EXCISION  1999  ? ear  ? SYNOVECTOMY Left 12/01/2017  ? Procedure: SYNOVECTOMY;  Surgeon: [name], MD;  Location: [facility];  Service: Orthopedics;  Laterality: Left;  Partial  ? V TACH ABLATION N/A 08/14/2017  ? Procedure: V Tach Ablation;  Surgeon: [name], MD;  Location: [facility];  Service: Cardiovascular;  Laterality: N/A;  ? VEIN SURGERY  2000  ? laser  ? ? ?Prior to Admission medications   ?Medication Sig Start Date End Date Taking? Authorizing Provider  ?aspirin EC 81 MG tablet Take 81 mg by mouth daily.   Yes [provider]  ?atorvastatin (LIPITOR) 80 MG tablet Take 1 tablet (80 mg total) by mouth at bedtime. 01/01/22  Yes Cannady, Henrine Screws T, NP  ?furosemide (LASIX) 20 MG tablet Take 20 mg by mouth.   Yes [provider]  ?metoprolol succinate (TOPROL-XL) 25 MG 24 hr tablet Take 1 tablet (25 mg total) by mouth daily. 01/01/22  Yes Cannady, [name] T, NP  ?montelukast (SINGULAIR) 10 MG tablet Take 1 tablet (10 mg total) by mouth at bedtime. 03/25/22  Yes Cannady, Henrine Screws T, NP  ?niacin (NIASPAN) 1000 MG CR tablet TAKE 1 TABLET(1000 MG) BY MOUTH AT BEDTIME 01/01/22  Yes Cannady, [name] T, NP  ?pantoprazole (PROTONIX) 40 MG tablet TAKE 1 TABLET(40 MG) BY MOUTH DAILY 01/01/22  Yes Cannady,  [name] T, NP  ?ramipril (ALTACE) 10 MG capsule Take 10 mg by mouth daily.   Yes [provider]  ?acetaminophen (TYLENOL) 500 MG tablet Take 1,000 mg by mouth every 6 (six) hours as needed for moderate pain or headache.    [provider]  ?ALLERGY RELIEF 180 MG tablet TAKE 1 TABLET BY MOUTH EVERY DAY 02/13/21   Venita Lick, NP  ?Cholecalciferol (VITAMIN D3) 50 MCG (2000 UT) CHEW Chew 1 tablet by mouth daily.    [provider]  ?flecainide (TAMBOCOR) 100 MG tablet Take 2 tablets (200 mg total) as needed by mouth. As needed for recurrent tachycardia.  Do not repeat more than once per day or 3x per wk 10/13/17   [name],  [name], MD  ?nitroGLYCERIN (NITROSTAT) 0.4 MG SL tablet     [provider]  ?vitamin B-12 (CYANOCOBALAMIN) 1000 MCG tablet Take 1 tablet (1,000 mcg total) by mouth daily. 01/12/19   [name], MD  ? ? ?Allergies as of 03/28/2022  ? (No Known Allergies)  ? ? ?Family History  ?Problem Relation Age of Onset  ? Hypertension Mother   ? Heart disease Mother   ? Heart attack Mother   ? Diabetes Mother   ? Alzheimer's disease Father   ? Alzheimer's disease Brother   ? Congestive Heart Failure Brother   ? Hypertension Brother   ? Other Brother   ?     dementia  ? Heart disease Brother   ? Brain cancer Daughter 79  ? Cancer Daughter   ? Heart disease Son   ? Cancer Maternal Uncle   ? Cancer Maternal Grandfather   ? ? ?Social History  ? ?Socioeconomic History  ? Marital status: Married  ?  Spouse name: Not on file  ? Number of children: 2  ? Years of education: Not on file  ? Highest education level: Master's degree (e.g., MA, MS, MEng, MEd, MSW, MBA)  ?Occupational History  ? Occupation: retired  ?Tobacco Use  ? Smoking status: Former  ?  Packs/day: 1.00  ?  Years: 5.00  ?  Pack years: 5.00  ?  Types: Cigarettes  ?  Quit date: 06/21/1971  ?  Years since quitting: 50.8  ? Smokeless tobacco: Former  ?  Types: Chew  ?  Quit date: 06/20/1980  ?Vaping Use  ? Vaping Use: Never used  ?Substance and Sexual Activity  ? Alcohol use: Not Currently  ?  Alcohol/week: 0.0 standard drinks  ? Drug use: No  ? Sexual activity: Not Currently  ?Other Topics Concern  ? Not on file  ?Social History Narrative  ? Lives in [place]  ? Retired school principal  ? ?Social Determinants of Health  ? ?Financial Resource Strain: Low Risk   ? Difficulty of Paying Living Expenses: Not hard at all  ?Food Insecurity: No Food Insecurity  ? Worried About Charity fundraiser in the Last Year: Never true  ? Ran Out of Food in the Last Year: Never true  ?Transportation Needs: No Transportation Needs  ? Lack of Transportation (Medical): No  ? Lack of  Transportation (Non-Medical): No  ?Physical Activity: Unknown  ? Days of Exercise per Week: Not on file  ? Minutes of Exercise per Session: 40 min  ?Stress: No Stress Concern Present  ? Feeling of Stress : Not at all  ?Social Connections: Socially Integrated  ? Frequency of Communication with Friends and Family: More than three times a week  ? Frequency of Social  Gatherings with Friends and Family: More than three times a week  ? Attends Religious Services: More than 4 times per year  ? Active Member of Clubs or Organizations: Yes  ? Attends Archivist Meetings: More than 4 times per year  ? Marital Status: Married  ?Intimate Partner Violence: Not At Risk  ? Fear of Current or Ex-Partner: No  ? Emotionally Abused: No  ? Physically Abused: No  ? Sexually Abused: No  ? ? ?Review of Systems: ?See HPI, otherwise negative ROS ? ?Physical Exam: ?BP 124/81   Pulse 60   Temp (!) 96.9 ?F (36.1 ?C) (Temporal)   Resp 18   Ht '5\' 11"'  (1.803 m)   Wt 86.2 kg   SpO2 99%   BMI 26.50 kg/m?  ?General:   Alert,  pleasant and cooperative in NAD ?Head:  Normocephalic and atraumatic. ?Neck:  Supple; no masses or thyromegaly. ?Lungs:  Clear throughout to auscultation, normal respiratory effort.    ?Heart:  +S1, +S2, Regular rate and rhythm, No edema. ?Abdomen:  Soft, nontender and nondistended. Normal bowel sounds, without guarding, and without rebound.   ?Neurologic:  Alert and  oriented x4;  grossly normal neurologically. ? ?Impression/Plan: ?Dusty A Baggett Sr. is here for an endoscopy  to be performed for  evaluation of dysphagia ?   ?Risks, benefits, limitations, and alternatives regarding endoscopy have been reviewed with the patient.  Questions have been answered.  All parties agreeable. ? ? ?Jonathon Bellows, MD  04/01/2022, 10:53 AM ?d ?

## 2022-04-01 NOTE — Op Note (Signed)
[facility] ?Gastroenterology ?Patient Name: Shawn Shepard ?Procedure Date: 04/01/2022 10:58 AM ?MRN: [id number] ?Account #: [id number] ?Date of Birth: 13-May-1946 ?Admit Type: Outpatient ?Age: 76 ?Room: [facility] ENDO ROOM 4 ?Gender: Male ?Note Status: Finalized ?Instrument Name: Upper Endoscope [id number] ?Procedure:             Upper GI endoscopy ?Indications:           Dysphagia ?Providers:             Jonathon Bellows MD, MD ?Referring MD:          Barbaraann Faster. Ned Card (Referring MD) ?Medicines:             Monitored Anesthesia Care ?Complications:         No immediate complications. ?Procedure:             Pre-Anesthesia Assessment: ?                       - Prior to the procedure, a History and Physical was  ?                       performed, and patient medications, allergies and  ?                       sensitivities were reviewed. The patient's tolerance  ?                       of previous anesthesia was reviewed. ?                       - The risks and benefits of the procedure and the  ?                       sedation options and risks were discussed with the  ?                       patient. All questions were answered and informed  ?                       consent was obtained. ?                       - ASA Grade Assessment: II - A patient with mild  ?                       systemic disease. ?                       After obtaining informed consent, the endoscope was  ?                       passed under direct vision. Throughout the procedure,  ?                       the patient's blood pressure, pulse, and oxygen  ?                       saturations were monitored continuously. The Endoscope  ?                       was introduced through  the mouth, and advanced to the  ?                       third part of duodenum. The upper GI endoscopy was  ?                       accomplished with ease. The patient tolerated the  ?                       procedure well. ?Findings: ?     The examined esophagus was  normal. Biopsies were taken with a cold  ?     forceps for histology. ?     The stomach was normal. ?     The examined duodenum was normal. ?     The cardia and gastric fundus were normal on retroflexion. ?Impression:            - Normal esophagus. Biopsied. ?                       - Normal stomach. ?                       - Normal examined duodenum. ?Recommendation:        - Discharge patient to home (with escort). ?                       - Resume previous diet. ?                       - Continue present medications. ?                       - Await pathology results. ?                       - Return to my office PRN. ?Procedure Code(s):     --- Professional --- ?                       [phone], Esophagogastroduodenoscopy, flexible,  ?                       transoral; with biopsy, single or multiple ?Diagnosis Code(s):     --- Professional --- ?                       R13.10, Dysphagia, unspecified ?CPT copyright 2019 American Medical Association. All rights reserved. ?The codes documented in this report are preliminary and upon coder review may  ?be revised to meet current compliance requirements. ?Jonathon Bellows, MD ?Jonathon Bellows MD, MD ?04/01/2022 11:08:13 AM ?This report has been signed electronically. ?Number of Addenda: 0 ?Note Initiated On: 04/01/2022 10:58 AM ?Estimated Blood Loss:  Estimated blood loss: none. ?     [facility] ?

## 2022-04-01 NOTE — Anesthesia Preprocedure Evaluation (Signed)
Anesthesia Evaluation  ?Patient identified by MRN, date of birth, ID band ?Patient awake ? ?General Assessment Comment:Patient had what sounds like angina 10 days ago during golf game, which resolved by itself and has not returned. He went to the ED and was told he was not having a heart attack. He is scheduled for a stress test next week. ? ?Reviewed: ?Allergy & Precautions, NPO status , Patient's Chart, lab work & pertinent test results ? ?History of Anesthesia Complications ?Negative for: history of anesthetic complications ? ?Airway ?Mallampati: II ? ?TM Distance: >3 FB ?Neck ROM: Full ? ? ? Dental ?no notable dental hx. ?(+) Teeth Intact ?  ?Pulmonary ?neg pulmonary ROS, neg sleep apnea, neg COPD, Patient abstained from smoking.Not current smoker, former smoker,  ?  ?Pulmonary exam normal ?breath sounds clear to auscultation ? ? ? ? ? ? Cardiovascular ?Exercise Tolerance: Good ?METShypertension, + CAD  ?(-) Past MI (-) dysrhythmias  ?Rhythm:Regular Rate:Normal ?- Systolic murmurs ? ?  ?Neuro/Psych ?negative neurological ROS ? negative psych ROS  ? GI/Hepatic ?GERD  ,(+)  ?  ? (-) substance abuse ? ,   ?Endo/Other  ?neg diabetes ? Renal/GU ?negative Renal ROS  ? ?  ?Musculoskeletal ? ? Abdominal ?  ?Peds ? Hematology ?  ?Anesthesia Other Findings ?Past Medical History: ?No date: Arthritis ?1999: Basal cell carcinoma ?    Comment:  basal cell/skin ?No date: CAD (coronary artery disease) ?Cataract surgery 2017: Cataract ?No date: GERD (gastroesophageal reflux disease) ?No date: History of bone marrow biopsy ?2013: History of kidney stones ?    Comment:  passed stone on his own ?No date: Hyperlipidemia ?No date: Hypertension ?No date: Leukopenia ?No date: Shingles ?No date: Ventricular tachycardia ([place]) ? Reproductive/Obstetrics ? ?  ? ? ? ? ? ? ? ? ? ? ? ? ? ?  ?  ? ? ? ? ? ? ? ? ?Anesthesia Physical ?Anesthesia Plan ? ?ASA: 3 ? ?Anesthesia Plan: General  ? ?Post-op Pain  Management: Minimal or no pain anticipated  ? ?Induction: Intravenous ? ?PONV Risk Score and Plan: 2 and Propofol infusion, TIVA and Ondansetron ? ?Airway Management Planned: Nasal Cannula ? ?Additional Equipment: None ? ?Intra-op Plan:  ? ?Post-operative Plan:  ? ?Informed Consent: I have reviewed the patients History and Physical, chart, labs and discussed the procedure including the risks, benefits and alternatives for the proposed anesthesia with the patient or authorized representative who has indicated his/her understanding and acceptance.  ? ? ? ?Dental advisory given ? ?Plan Discussed with: CRNA and Surgeon ? ?Anesthesia Plan Comments: (Given that patient is having this procedure for recurrent choking episodes almost requiring ED visit for food impaction, the risk/benefit of proceeding vs delaying for stress test probably favors proceeding with the anesthetic. The patient has known CAD and follows closely with his cardiologist, and his new angina symptoms have not returned since last week. Patient understands potential increased anesthetic risks as outlined below, and would like to proceed. ? ?Discussed risks of anesthesia with patient, including possibility of difficulty with spontaneous ventilation under anesthesia necessitating airway intervention, PONV, and rare risks such as cardiac or respiratory or neurological events, and allergic reactions. Discussed the role of CRNA in patient's perioperative care. Patient understands.)  ? ? ? ? ? ? ?Anesthesia Quick Evaluation ? ?

## 2022-04-01 NOTE — Anesthesia Postprocedure Evaluation (Signed)
Anesthesia Post Note ? ?Patient: Shawn GIKAS Sr. ? ?Procedure(s) Performed: ESOPHAGOGASTRODUODENOSCOPY (EGD) WITH PROPOFOL ? ?Patient location during evaluation: Endoscopy ?Anesthesia Type: General ?Level of consciousness: awake and alert ?Pain management: pain level controlled ?Vital Signs Assessment: post-procedure vital signs reviewed and stable ?Respiratory status: spontaneous breathing, nonlabored ventilation, respiratory function stable and patient connected to nasal cannula oxygen ?Cardiovascular status: blood pressure returned to baseline and stable ?Postop Assessment: no apparent nausea or vomiting ?Anesthetic complications: no ? ? ?No notable events documented. ? ? ?Last Vitals:  ?Vitals:  ? 04/01/22 0941 04/01/22 1109  ?BP: 124/81 111/72  ?Pulse: 60 (!) 57  ?Resp: 18 19  ?Temp: (!) 36.1 ?C (!) 35.9 ?C  ?SpO2: 99% 96%  ?  ?Last Pain:  ?Vitals:  ? 04/01/22 1109  ?TempSrc: Tympanic  ?PainSc: Asleep  ? ? ?  ?  ?  ?  ?  ?  ? ?Arita Miss ? ? ? ? ?

## 2022-04-02 ENCOUNTER — Encounter: Payer: Self-pay | Admitting: Gastroenterology

## 2022-04-02 LAB — SURGICAL PATHOLOGY

## 2022-04-02 NOTE — Progress Notes (Signed)
Maritza inform patient biopsies show EOE- set up video or office visit if possible tomorrow morning after my last procedure as I have an opening to explain next steps ? ?C/c Cannady, Barbaraann Faster, NP (FYI) ? ?Dr Jonathon Bellows MD,MRCP [facility]) ?Gastroenterology/Hepatology ?Pager: [phone] ?

## 2022-04-04 DIAGNOSIS — R0602 Shortness of breath: Secondary | ICD-10-CM | POA: Diagnosis not present

## 2022-04-04 DIAGNOSIS — R079 Chest pain, unspecified: Secondary | ICD-10-CM | POA: Diagnosis not present

## 2022-04-08 DIAGNOSIS — I739 Peripheral vascular disease, unspecified: Secondary | ICD-10-CM | POA: Diagnosis not present

## 2022-04-08 DIAGNOSIS — I340 Nonrheumatic mitral (valve) insufficiency: Secondary | ICD-10-CM | POA: Diagnosis not present

## 2022-04-08 DIAGNOSIS — I2510 Atherosclerotic heart disease of native coronary artery without angina pectoris: Secondary | ICD-10-CM | POA: Diagnosis not present

## 2022-04-08 DIAGNOSIS — E782 Mixed hyperlipidemia: Secondary | ICD-10-CM | POA: Diagnosis not present

## 2022-04-08 DIAGNOSIS — I10 Essential (primary) hypertension: Secondary | ICD-10-CM | POA: Diagnosis not present

## 2022-04-08 DIAGNOSIS — I351 Nonrheumatic aortic (valve) insufficiency: Secondary | ICD-10-CM | POA: Diagnosis not present

## 2022-04-09 DIAGNOSIS — H02412 Mechanical ptosis of left eyelid: Secondary | ICD-10-CM | POA: Diagnosis not present

## 2022-04-09 DIAGNOSIS — H57812 Brow ptosis, left: Secondary | ICD-10-CM | POA: Diagnosis not present

## 2022-04-09 DIAGNOSIS — H57813 Brow ptosis, bilateral: Secondary | ICD-10-CM | POA: Diagnosis not present

## 2022-04-09 DIAGNOSIS — H02413 Mechanical ptosis of bilateral eyelids: Secondary | ICD-10-CM | POA: Diagnosis not present

## 2022-04-09 DIAGNOSIS — H0279 Other degenerative disorders of eyelid and periocular area: Secondary | ICD-10-CM | POA: Diagnosis not present

## 2022-04-09 DIAGNOSIS — H53483 Generalized contraction of visual field, bilateral: Secondary | ICD-10-CM | POA: Diagnosis not present

## 2022-04-09 DIAGNOSIS — H02834 Dermatochalasis of left upper eyelid: Secondary | ICD-10-CM | POA: Diagnosis not present

## 2022-04-09 DIAGNOSIS — H02831 Dermatochalasis of right upper eyelid: Secondary | ICD-10-CM | POA: Diagnosis not present

## 2022-04-09 DIAGNOSIS — H53453 Other localized visual field defect, bilateral: Secondary | ICD-10-CM | POA: Diagnosis not present

## 2022-04-09 DIAGNOSIS — H02423 Myogenic ptosis of bilateral eyelids: Secondary | ICD-10-CM | POA: Diagnosis not present

## 2022-04-09 HISTORY — PX: EYE SURGERY: SHX253

## 2022-04-15 ENCOUNTER — Ambulatory Visit: Payer: Medicare PPO | Admitting: Gastroenterology

## 2022-04-15 ENCOUNTER — Encounter: Payer: Self-pay | Admitting: Gastroenterology

## 2022-04-15 ENCOUNTER — Other Ambulatory Visit: Payer: Self-pay

## 2022-04-15 VITALS — BP 113/65 | HR 56 | Temp 98.7°F | Wt 199.6 lb

## 2022-04-15 DIAGNOSIS — K200 Eosinophilic esophagitis: Secondary | ICD-10-CM | POA: Diagnosis not present

## 2022-04-15 MED ORDER — FLUTICASONE PROPIONATE HFA 220 MCG/ACT IN AERO: 2.0000 | INHALATION_SPRAY | Freq: Two times a day (BID) | RESPIRATORY_TRACT | 1 refills | Status: DC

## 2022-04-15 NOTE — Progress Notes (Signed)
d ?  ?Jonathon Bellows MD, MRCP(U.K) ?[place]  ?[address]  ?[place], [place] [address]  ?Main: [phone]  ?Fax: [phone] ? ? ?Primary Care Physician: Venita Lick, NP ? ?Primary Gastroenterologist:  Dr. Jonathon Bellows  ? ?Chief Complaint  ?Patient presents with  ? Dysphagia  ? ? ?HPI: Shawn CORVINO Sr. is a 76 y.o. male ? ? ?Summary of history : ? ? ?Initially referred and seen on 03/28/2022 for dysphagia of 9 months duration , 4 episodes of food choking , has had a history of allergies, been on Rx for reflux.  ? ? ?Interval history   03/28/2022-04/15/2022 ? ?04/01/2022: EGD: normal endoscopically : bx of the esophagus showed > 100 eosinophils phpf.  ? ?Since his endoscopy he is doing well.  Discussed the results of his biopsies and its implications. ? ? ? ?Current Outpatient Medications  ?Medication Sig Dispense Refill  ? acetaminophen (TYLENOL) 500 MG tablet Take 1,000 mg by mouth every 6 (six) hours as needed for moderate pain or headache.    ? ALLERGY RELIEF 180 MG tablet TAKE 1 TABLET BY MOUTH EVERY DAY 90 tablet 4  ? aspirin EC 81 MG tablet Take 81 mg by mouth daily.    ? atorvastatin (LIPITOR) 80 MG tablet Take 1 tablet (80 mg total) by mouth at bedtime. 90 tablet 4  ? Cholecalciferol (VITAMIN D3) 50 MCG (2000 UT) CHEW Chew 1 tablet by mouth daily.    ? flecainide (TAMBOCOR) 100 MG tablet Take 2 tablets (200 mg total) as needed by mouth. As needed for recurrent tachycardia.  Do not repeat more than once per day or 3x per wk 2 tablet 3  ? furosemide (LASIX) 20 MG tablet Take 20 mg by mouth.    ? metoprolol succinate (TOPROL-XL) 50 MG 24 hr tablet Take 1 tablet by mouth daily.    ? montelukast (SINGULAIR) 10 MG tablet Take 1 tablet (10 mg total) by mouth at bedtime. 90 tablet 4  ? niacin (NIASPAN) 1000 MG CR tablet TAKE 1 TABLET(1000 MG) BY MOUTH AT BEDTIME 90 tablet 4  ? nitroGLYCERIN (NITROSTAT) 0.4 MG SL tablet     ? pantoprazole (PROTONIX) 40 MG tablet TAKE 1 TABLET(40 MG) BY MOUTH DAILY 90 tablet  4  ? ramipril (ALTACE) 10 MG capsule Take 10 mg by mouth daily.    ? vitamin B-12 (CYANOCOBALAMIN) 1000 MCG tablet Take 1 tablet (1,000 mcg total) by mouth daily. 90 tablet 0  ? ?No current facility-administered medications for this visit.  ? ? ?Allergies as of 04/15/2022  ? (No Known Allergies)  ? ? ?ROS: ? ?General: Negative for anorexia, weight loss, fever, chills, fatigue, weakness. ?ENT: Negative for hoarseness, difficulty swallowing , nasal congestion. ?CV: Negative for chest pain, angina, palpitations, dyspnea on exertion, peripheral edema.  ?Respiratory: Negative for dyspnea at rest, dyspnea on exertion, cough, sputum, wheezing.  ?GI: See history of present illness. ?GU:  Negative for dysuria, hematuria, urinary incontinence, urinary frequency, nocturnal urination.  ?Endo: Negative for unusual weight change.  ?  ?Physical Examination: ? ? BP 113/65   Pulse (!) 56   Temp 98.7 ?F (37.1 ?C) (Oral)   Wt 199 lb 9.6 oz (90.5 kg)   BMI 27.84 kg/m?  ? ?General: Well-nourished, well-developed in no acute distress.  ?Eyes: No icterus. Conjunctivae pink. ?Neuro: Alert and oriented x 3.  Grossly intact. ?Skin: Warm and dry, no jaundice.   ?Psych: Alert and cooperative, normal mood and affect. ? ? ?Imaging Studies: ?DG  Chest 2 View ? ?Result Date: 03/20/2022 ?CLINICAL DATA:  Chest pain, tachycardia EXAM: CHEST - 2 VIEW COMPARISON:  08/10/2017 FINDINGS: Frontal and lateral views of the chest demonstrate an unremarkable cardiac silhouette. No acute airspace disease, effusion, or pneumothorax. Possible 7 mm nodule overlying the left anterior second rib. No acute bony abnormalities. IMPRESSION: 1. No acute intrathoracic process. 2. Possible 7 mm left upper lobe pulmonary nodule. Nonemergent follow-up CT chest could be performed for further evaluation. Electronically Signed   By: Randa Ngo M.D.   On: 03/20/2022 17:47  ? ?CT Angio Chest PE W and/or Wo Contrast ? ?Result Date: 03/20/2022 ?CLINICAL DATA:  Chest pain and  tachycardia. EXAM: CT ANGIOGRAPHY CHEST WITH CONTRAST TECHNIQUE: Multidetector CT imaging of the chest was performed using the standard protocol during bolus administration of intravenous contrast. Multiplanar CT image reconstructions and MIPs were obtained to evaluate the vascular anatomy. RADIATION DOSE REDUCTION: This exam was performed according to the departmental dose-optimization program which includes automated exposure control, adjustment of the mA and/or kV according to patient size and/or use of iterative reconstruction technique. CONTRAST:  8m OMNIPAQUE IOHEXOL 350 MG/ML SOLN COMPARISON:  None. FINDINGS: Cardiovascular: Satisfactory opacification of the pulmonary arteries to the segmental level. No evidence of pulmonary embolism. Normal heart size with marked severity coronary artery calcification. No pericardial effusion. Mediastinum/Nodes: No enlarged mediastinal, hilar, or axillary lymph nodes. Thyroid gland, trachea, and esophagus demonstrate no significant findings. Lungs/Pleura: A 2 mm noncalcified lung nodule is seen within the anterolateral aspect of the right upper lobe (axial CT image 51, CT series 5). There is no evidence of acute infiltrate, pleural effusion or pneumothorax. Upper Abdomen: There is a small hiatal hernia. Musculoskeletal: No chest wall abnormality. No acute or significant osseous findings. Review of the MIP images confirms the above findings. IMPRESSION: 1. No evidence of pulmonary embolism or other acute intrathoracic process. 2. Marked severity coronary artery calcification. 3. 2 mm noncalcified lung nodule within the anterolateral aspect of the right upper lobe. No follow-up needed if patient is low-risk.This recommendation follows the consensus statement: Guidelines for Management of Incidental Pulmonary Nodules Detected on CT Images: From the Fleischner Society 2017; Radiology 2017; 284:228-243. 4. Small hiatal hernia. Electronically Signed   By: TVirgina NorfolkM.D.    On: 03/20/2022 22:26   ? ?Assessment and Plan:  ? ?Shawn KitchensSr. is a 76y.o. y/o male with a history of dysphagia here for follow up . 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  He has a history of multiple food allergies. ? ?Plan  ?Patient information on EOE ?Food allergy profile based on the results we will refer him to Dr. MTami Ribaswho is his ENT physician ?Fluticasone inhaler to be swallowed for 8 weeks.  Down the road if required we could consider Dupixent if he fails inhaled steroids.  I have demonstrated the inhaler technique for him to swallow the steroids rather than inhale it ? ? ? ?Dr KJonathon Bellows MD,MRCP ([facility] ?Follow up in 10 weeks ?

## 2022-04-18 ENCOUNTER — Encounter: Payer: Self-pay | Admitting: Gastroenterology

## 2022-04-18 LAB — CELIAC DISEASE PANEL
Endomysial IgA: NEGATIVE
IgA/Immunoglobulin A, Serum: 169 mg/dL (ref 61–437)
Transglutaminase IgA: <2 U/mL (ref 0–3)

## 2022-04-20 LAB — FOOD ALLERGY PROFILE
Allergen Corn, IgE: 0.11 kU/L — AB
Clam IgE: <0.1 kU/L
Codfish IgE: <0.1 kU/L
Egg White IgE: 0.13 kU/L — AB
Milk IgE: <0.1 kU/L
Peanut IgE: 0.1 kU/L — AB
Scallop IgE: <0.1 kU/L
Sesame Seed IgE: <0.1 kU/L
Shrimp IgE: <0.1 kU/L
Soybean IgE: <0.1 kU/L
Walnut IgE: <0.1 kU/L
Wheat IgE: 0.11 kU/L — AB

## 2022-04-28 NOTE — Patient Instructions (Signed)

## 2022-04-29 DIAGNOSIS — I2510 Atherosclerotic heart disease of native coronary artery without angina pectoris: Secondary | ICD-10-CM | POA: Diagnosis not present

## 2022-04-29 DIAGNOSIS — I10 Essential (primary) hypertension: Secondary | ICD-10-CM | POA: Diagnosis not present

## 2022-04-29 DIAGNOSIS — I340 Nonrheumatic mitral (valve) insufficiency: Secondary | ICD-10-CM | POA: Diagnosis not present

## 2022-04-29 DIAGNOSIS — E782 Mixed hyperlipidemia: Secondary | ICD-10-CM | POA: Diagnosis not present

## 2022-04-29 DIAGNOSIS — R609 Edema, unspecified: Secondary | ICD-10-CM | POA: Diagnosis not present

## 2022-04-29 DIAGNOSIS — I739 Peripheral vascular disease, unspecified: Secondary | ICD-10-CM | POA: Diagnosis not present

## 2022-04-29 DIAGNOSIS — I351 Nonrheumatic aortic (valve) insufficiency: Secondary | ICD-10-CM | POA: Diagnosis not present

## 2022-05-02 ENCOUNTER — Ambulatory Visit: Payer: Medicare PPO | Admitting: Nurse Practitioner

## 2022-05-02 ENCOUNTER — Other Ambulatory Visit: Payer: Self-pay | Admitting: Nurse Practitioner

## 2022-05-02 ENCOUNTER — Encounter: Payer: Self-pay | Admitting: Nurse Practitioner

## 2022-05-02 VITALS — BP 119/68 | HR 54 | Temp 97.7°F | Ht 71.0 in | Wt 197.6 lb

## 2022-05-02 DIAGNOSIS — R600 Localized edema: Secondary | ICD-10-CM | POA: Insufficient documentation

## 2022-05-02 DIAGNOSIS — R079 Chest pain, unspecified: Secondary | ICD-10-CM

## 2022-05-02 DIAGNOSIS — I472 Ventricular tachycardia, unspecified: Secondary | ICD-10-CM | POA: Diagnosis not present

## 2022-05-02 DIAGNOSIS — R1310 Dysphagia, unspecified: Secondary | ICD-10-CM

## 2022-05-02 DIAGNOSIS — J3089 Other allergic rhinitis: Secondary | ICD-10-CM

## 2022-05-02 DIAGNOSIS — I10 Essential (primary) hypertension: Secondary | ICD-10-CM | POA: Diagnosis not present

## 2022-05-02 DIAGNOSIS — K200 Eosinophilic esophagitis: Secondary | ICD-10-CM

## 2022-05-02 NOTE — Progress Notes (Signed)
BP 119/68   Pulse (!) 54   Temp 97.7 F (36.5 C) (Oral)   Ht '5\' 11"'$  (1.803 m)   Wt 197 lb 9.6 oz (89.6 kg)   SpO2 97%   BMI 27.56 kg/m    Subjective:    Patient ID: Shawn Kitchens Sr., male    DOB: 09/30/1946, 76 y.o.   MRN: [id number]  HPI: Shawn STREETT Sr. is a 76 y.o. male  Chief Complaint  Patient presents with   Chest Pain    Patient is here for a six week follow up. Patient says Shawn Shepard has not had anymore episodes of chest pain. Patient has seen Cardiologist since Shawn Shepard was last seen here in office. Patient states Shawn Shepard had a stress test and etc. Patient says everything was fine.    Dysphagia    Patient states Shawn Shepard was prescribed Flovent inhaler due to the results of his Endoscopy. Patient states Shawn Shepard was informed that it was the same as asthma but it was in his Esophagus.    Leg Swelling    Patient states Shawn Shepard has noticed more swelling in his right leg and ankle. Patient states Shawn Shepard spoke with his Cardiologist and Shawn Shepard ordered a doppler ultrasound to be check for a DVT. Patient is scheduled next Wednesday. Patient states Shawn Shepard has some leg pain in his right leg after playing golf. Patient states Shawn Shepard wore support stockings the entire time.    CHEST PAIN Saw Dr. Humphrey Rolls on Monday this week -- had a stress test and echo, is having a doppler to right lower leg to further assess swelling and r/o DVT, next Wednesday is scheduled.  Dr. Humphrey Rolls increased Metoprolol from 25 to 50 MG -- has tolerated change. Trauma: none Status: better Current pain status: pain free Shortness of breath: no Cough: yes, non-productive Nausea: no Diaphoresis: no Heartburn: no Palpitations: no   GERD Saw GI after recent visit and had an EGD which returned noting eosinophilic esophagitis -- was referred to ENT for further allergy work-up and fluticasone inhaler to be used for 8 weeks sent, they are considering [place] in future.  Swallowing has improved and no further choking episodes.  Shawn Shepard is still taking Singulair --  although Shawn Shepard still takes Sudafed twice a day.   GERD control status: stable Satisfied with current treatment? yes Heartburn frequency: less at this time Medication side effects: some hot flashes when started Fluticasone Medication compliance: stable Dysphagia: no Odynophagia:  no Hematemesis: no Blood in stool: no EGD: yes   Relevant past medical, surgical, family and social history reviewed and updated as indicated. Interim medical history since our last visit reviewed. Allergies and medications reviewed and updated.  Review of Systems  Constitutional:  Negative for activity change, diaphoresis, fatigue and fever.  Respiratory:  Negative for cough, chest tightness, shortness of breath and wheezing.   Cardiovascular:  Positive for leg swelling. Negative for chest pain and palpitations.  Gastrointestinal:  Negative for abdominal distention, abdominal pain, blood in stool, constipation, diarrhea, nausea and vomiting.  Endocrine: Negative.   Neurological: Negative.   Psychiatric/Behavioral: Negative.     Per HPI unless specifically indicated above     Objective:    BP 119/68   Pulse (!) 54   Temp 97.7 F (36.5 C) (Oral)   Ht '5\' 11"'$  (1.803 m)   Wt 197 lb 9.6 oz (89.6 kg)   SpO2 97%   BMI 27.56 kg/m   Wt Readings from Last 3 Encounters:  05/02/22 197 lb 9.6 oz (  89.6 kg)  04/15/22 199 lb 9.6 oz (90.5 kg)  04/01/22 190 lb (86.2 kg)    Physical Exam Vitals and nursing note reviewed.  Constitutional:      General: Shawn Shepard is awake. Shawn Shepard is not in acute distress.    Appearance: Shawn Shepard is well-developed and well-groomed. Shawn Shepard is not ill-appearing or toxic-appearing.  HENT:     Head: Normocephalic and atraumatic.     Right Ear: Hearing normal. No drainage.     Left Ear: Hearing normal. No drainage.  Eyes:     General: Lids are normal.        Right eye: No discharge.        Left eye: No discharge.     Conjunctiva/sclera: Conjunctivae normal.     Pupils: Pupils are equal, round, and  reactive to light.  Neck:     Thyroid: No thyromegaly.     Vascular: No carotid bruit.  Cardiovascular:     Rate and Rhythm: Normal rate and regular rhythm.     Pulses:          Dorsalis pedis pulses are 2+ on the right side and 2+ on the left side.       Posterior tibial pulses are 2+ on the right side and 2+ on the left side.     Heart sounds: Normal heart sounds, S1 normal and S2 normal. No murmur heard.   No gallop.     Comments: Significant varicosities to bilateral legs R>L. Pulmonary:     Effort: Pulmonary effort is normal. No accessory muscle usage or respiratory distress.     Breath sounds: Normal breath sounds.  Abdominal:     General: Bowel sounds are normal.     Palpations: Abdomen is soft. There is no hepatomegaly or splenomegaly.  Musculoskeletal:        General: Normal range of motion.     Cervical back: Normal range of motion and neck supple.     Right lower leg: 2+ Edema present.     Left lower leg: 1+ Edema present.  Lymphadenopathy:     Cervical: No cervical adenopathy.  Skin:    General: Skin is warm and dry.     Capillary Refill: Capillary refill takes less than 2 seconds.     Findings: No rash.  Neurological:     Mental Status: Shawn Shepard is alert and oriented to person, place, and time.     Deep Tendon Reflexes: Reflexes are normal and symmetric.  Psychiatric:        Attention and Perception: Attention normal.        Mood and Affect: Mood normal.        Speech: Speech normal.        Behavior: Behavior normal. Behavior is cooperative.        Thought Content: Thought content normal.    Results for orders placed or performed in visit on 04/15/22  Food Allergy Profile  Result Value Ref Range   Class Description Allergens Comment    Egg White IgE 0.13 (A) Class 0/I kU/L   Peanut IgE 0.10 (A) Class 0/I kU/L   Soybean IgE <0.10 Class 0 kU/L   Milk IgE <0.10 Class 0 kU/L   Clam IgE <0.10 Class 0 kU/L   Shrimp IgE <0.10 Class 0 kU/L   Walnut IgE <0.10 Class 0  kU/L   Codfish IgE <0.10 Class 0 kU/L   Scallop IgE <0.10 Class 0 kU/L   Wheat IgE 0.11 (A) Class 0/I kU/L  Allergen Corn, IgE 0.11 (A) Class 0/I kU/L   Sesame Seed IgE <0.10 Class 0 kU/L  Celiac Disease Panel  Result Value Ref Range   Endomysial IgA Negative Negative   Transglutaminase IgA <2 0 - 3 U/mL   IgA/Immunoglobulin A, Serum 169 61 - 437 mg/dL      Assessment & Plan:   Problem List Items Addressed This Visit       Cardiovascular and Mediastinum   Essential hypertension    Chronic, stable with BP at goal in office today.  Recommend Shawn Shepard monitor BP at least a few mornings a week at home and document.  DASH diet at home.  Continue current medication regimen and adjust as needed.  Labs today: none.  Return in 6 months.        Ventricular tachycardia (HCC) - Primary    Chronic, ongoing.  Continue collaboration with cardiology, no recent use Tambocor.  Will see if we can get recent notes from Dr. Humphrey Rolls.         Respiratory   Allergic rhinitis    Chronic, ongoing.  Recommend Shawn Shepard cut back on Sudafed use and continue Singulair and OTC Zyrtec.  Discussed with him risks with long term Sudafed use, kidney and cardiac.  Continue collaboration with ENT.         Digestive   Eosinophilic esophagitis    Chronic, diagnosed on 04/01/22 by Dr. Vicente Males.  At this time continue collaboration with GI and highly recommend Shawn Shepard continue current medication regimen prescribed to him.  Recent notes reviewed.         Other   Bilateral leg edema    Chronic with significant varicosities.  Recommend Shawn Shepard wear compression hose daily, on during day and off at night.  Continue diuretic daily.  Is scheduled by cardiology for leg u/s next week, will review when notes sent.  May benefit from vascular in future.       Other Visit Diagnoses     Chest pain, unspecified type       Improved at this time -- continue to collaborate with cardiology.   Dysphagia, unspecified type       Improved at this time,  continue to collaborate with GI.        Follow up plan: Return for as scheduled in July.

## 2022-05-02 NOTE — Assessment & Plan Note (Signed)
Chronic, diagnosed on 04/01/22 by Dr. Vicente Males.  At this time continue collaboration with GI and highly recommend he continue current medication regimen prescribed to him.  Recent notes reviewed.

## 2022-05-02 NOTE — Assessment & Plan Note (Signed)
Chronic with significant varicosities.  Recommend he wear compression hose daily, on during day and off at night.  Continue diuretic daily.  Is scheduled by cardiology for leg u/s next week, will review when notes sent.  May benefit from vascular in future.

## 2022-05-02 NOTE — Assessment & Plan Note (Signed)
Chronic, ongoing.  Recommend he cut back on Sudafed use and continue Singulair and OTC Zyrtec.  Discussed with him risks with long term Sudafed use, kidney and cardiac.  Continue collaboration with ENT.

## 2022-05-02 NOTE — Assessment & Plan Note (Signed)
Chronic, stable with BP at goal in office today.  Recommend he monitor BP at least a few mornings a week at home and document.  DASH diet at home.  Continue current medication regimen and adjust as needed.  Labs today: none.  Return in 6 months.

## 2022-05-02 NOTE — Telephone Encounter (Signed)
Requested Prescriptions  Pending Prescriptions Disp Refills  . ALLERGY RELIEF 180 MG tablet [Pharmacy Med Name: FEXOFENADINE '180MG'$  TABLETS (OTC)] 90 tablet 1    Sig: TAKE 1 TABLET BY MOUTH EVERY DAY     Ear, Nose, and Throat:  Antihistamines Passed - 05/02/2022  6:36 AM      Passed - Valid encounter within last 12 months    Recent Outpatient Visits          Today Ventricular tachycardia ([place])   [place], Henrine Screws T, NP   1 month ago Ventricular tachycardia ([place])   [place], Black Jack T, NP   4 months ago Thrombocytopenia ([place])   [place], Cassandra T, NP   4 months ago Acute non-recurrent frontal sinusitis   [place], NP   8 months ago Rudy Jon Billings, NP      Future Appointments            In 1 month Jonathon Bellows, MD [place]   In 2 months [place], Barbaraann Faster, NP [employer]

## 2022-05-02 NOTE — Assessment & Plan Note (Signed)
Chronic, ongoing.  Continue collaboration with cardiology, no recent use Tambocor.  Will see if we can get recent notes from Dr. Humphrey Rolls.

## 2022-05-08 DIAGNOSIS — R609 Edema, unspecified: Secondary | ICD-10-CM | POA: Diagnosis not present

## 2022-05-14 ENCOUNTER — Ambulatory Visit: Payer: Medicare PPO | Admitting: Gastroenterology

## 2022-06-05 ENCOUNTER — Other Ambulatory Visit: Payer: Self-pay | Admitting: Gastroenterology

## 2022-06-10 DIAGNOSIS — G43109 Migraine with aura, not intractable, without status migrainosus: Secondary | ICD-10-CM | POA: Diagnosis not present

## 2022-06-10 DIAGNOSIS — H02831 Dermatochalasis of right upper eyelid: Secondary | ICD-10-CM | POA: Diagnosis not present

## 2022-06-10 DIAGNOSIS — H35373 Puckering of macula, bilateral: Secondary | ICD-10-CM | POA: Diagnosis not present

## 2022-06-10 DIAGNOSIS — Z961 Presence of intraocular lens: Secondary | ICD-10-CM | POA: Diagnosis not present

## 2022-06-10 DIAGNOSIS — H02834 Dermatochalasis of left upper eyelid: Secondary | ICD-10-CM | POA: Diagnosis not present

## 2022-06-24 ENCOUNTER — Encounter: Payer: Self-pay | Admitting: Gastroenterology

## 2022-06-24 ENCOUNTER — Ambulatory Visit: Payer: Medicare PPO | Admitting: Gastroenterology

## 2022-06-24 VITALS — BP 117/69 | HR 63 | Temp 98.0°F | Wt 199.4 lb

## 2022-06-24 DIAGNOSIS — K200 Eosinophilic esophagitis: Secondary | ICD-10-CM | POA: Diagnosis not present

## 2022-06-24 NOTE — Progress Notes (Signed)
Shawn Bellows MD, MRCP(U.K) [address]  [place]  [place], [place] [address]  Main: [phone]  Fax: [phone]   Primary Care Physician: Shawn Lick, NP  Primary Gastroenterologist:  Dr. Jonathon Shepard   Chief Complaint  Patient presents with   EOE    HPI: Shawn GOREE Sr. is a 76 y.o. male  Summary of history :     Initially referred and seen on 03/28/2022 for dysphagia of 9 months duration , 4 episodes of food choking , has had a history of allergies, been on Rx for reflux.  04/01/2022: EGD: normal endoscopically : bx of the esophagus showed > 100 eosinophils phpf.     Interval history   04/15/2022-06/24/2022  He completed his 8 weeks treatment with fluticasone inhaler for eosinophilic esophagitis and since then has no issues with swallowing.  He had food allergy testing which showed that he had an elevated IgE level 2 words egg whites peanuts wheat and corn.  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  He says that his father also had history of allergies and difficulty swallowing and had to be stretched and he also thinks his son has the same issues.  He is taking his pantoprazole at night before dinnertime.  No other symptoms presently.     Current Outpatient Medications  Medication Sig Dispense Refill   acetaminophen (TYLENOL) 500 MG tablet Take 1,000 mg by mouth every 6 (six) hours as needed for moderate pain or headache.     ALLERGY RELIEF 180 MG tablet TAKE 1 TABLET BY MOUTH EVERY DAY 90 tablet 1   aspirin EC 81 MG tablet Take 81 mg by mouth daily.     atorvastatin (LIPITOR) 80 MG tablet Take 1 tablet (80 mg total) by mouth at bedtime. 90 tablet 4   Cholecalciferol (VITAMIN D3) 50 MCG (2000 UT) CHEW Chew 1 tablet by mouth daily.     flecainide (TAMBOCOR) 100 MG tablet Take 2 tablets (200 mg total) as needed by mouth. As needed for recurrent tachycardia.  Do not repeat more than once  per day or 3x per wk 2 tablet 3   fluticasone (FLOVENT HFA) 220 MCG/ACT inhaler Inhale 2 puffs into the lungs in the morning and at bedtime. Inhale 2 puffs into the lungs and swallow for 8 weeks. Then rinse your mouth. 1 each 1   furosemide (LASIX) 20 MG tablet Take 20 mg by mouth.     metoprolol succinate (TOPROL-XL) 50 MG 24 hr tablet Take 1 tablet by mouth daily.     montelukast (SINGULAIR) 10 MG tablet Take 1 tablet (10 mg total) by mouth at bedtime. 90 tablet 4   niacin (NIASPAN) 1000 MG CR tablet TAKE 1 TABLET(1000 MG) BY MOUTH AT BEDTIME 90 tablet 4   nitroGLYCERIN (NITROSTAT) 0.4 MG SL tablet      pantoprazole (PROTONIX) 40 MG tablet TAKE 1 TABLET(40 MG) BY MOUTH DAILY 90 tablet 4   ramipril (ALTACE) 10 MG capsule Take 10 mg by mouth daily.     vitamin B-12 (CYANOCOBALAMIN) 1000 MCG tablet Take 1 tablet (1,000 mcg total) by mouth daily. 90 tablet 0   No current facility-administered medications for this visit.    Allergies as of 06/24/2022   (No Known Allergies)    ROS:  General: Negative for anorexia, weight loss, fever, chills, fatigue, weakness. ENT: Negative for hoarseness, difficulty swallowing , nasal  congestion. CV: Negative for chest pain, angina, palpitations, dyspnea on exertion, peripheral edema.  Respiratory: Negative for dyspnea at rest, dyspnea on exertion, cough, sputum, wheezing.  GI: See history of present illness. GU:  Negative for dysuria, hematuria, urinary incontinence, urinary frequency, nocturnal urination.  Endo: Negative for unusual weight change.    Physical Examination:   BP 117/69   Pulse 63   Temp 98 F (36.7 C) (Oral)   Wt 199 lb 6.4 oz (90.4 kg)   BMI 27.81 kg/m   General: Well-nourished, well-developed in no acute distress.  Eyes: No icterus. Conjunctivae pink. Mouth: Oropharyngeal mucosa moist and pink , no lesions erythema or exudate. Neuro: Alert and oriented x 3.  Grossly intact. Skin: Warm and dry, no jaundice.   Psych: Alert and  cooperative, normal mood and affect.   Imaging Studies: No results found.  Assessment and Plan:   [name] is a 76 y.o. y/o male with a history of dysphagia here for follow up . 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  He has a history of multiple food allergies.  He has responded appropriately after a course of fluticasone with no dysphagia at this point of time.  Discussed his food allergy testing.  He said he will call Dr. Tami Shepard whom he knows well to discuss about desensitization tests if appropriate but in the meanwhile he will avoid certain foods as we have discussed.  I suggested he continue his Protonix long-term but change the timing of his medication from night to morning and to be taken 40 minutes before breakfast.  Down the road if he has further dysphagia could give him a further course of fluticasone or if required could consider Dupixent as well       Dr Shawn Bellows  MD,MRCP [facility]) Follow up in as needed

## 2022-06-30 NOTE — Patient Instructions (Incomplete)
Please call to schedule your mammogram and/or bone density: [facility] at [place]: [address], [place], [place] [address] Phone: [phone]  [place] at [facility] [address] [place],  [place]  [address] Phone: [phone]    DASH Eating Plan DASH stands for Dietary Approaches to Stop Hypertension. The DASH eating plan is a healthy eating plan that has been shown to: Reduce high blood pressure (hypertension). Reduce your risk for type 2 diabetes, heart disease, and stroke. Help with weight loss. What are tips for following this plan? Reading food labels Check food labels for the amount of salt (sodium) per serving. Choose foods with less than 5 percent of the Daily Value of sodium. Generally, foods with less than 300 milligrams (mg) of sodium per serving fit into this eating plan. To find whole grains, look for the word "whole" as the first word in the ingredient list. Shopping Buy products labeled as "low-sodium" or "no salt added." Buy fresh foods. Avoid canned foods and pre-made or frozen meals. Cooking Avoid adding salt when cooking. Use salt-free seasonings or herbs instead of table salt or sea salt. Check with your health care provider or pharmacist before using salt substitutes. Do not fry foods. Cook foods using healthy methods such as baking, boiling, grilling, roasting, and broiling instead. Cook with heart-healthy oils, such as olive, canola, avocado, soybean, or sunflower oil. Meal planning  Eat a balanced diet that includes: 4 or more servings of fruits and 4 or more servings of vegetables each day. Try to fill one-half of your plate with fruits and vegetables. 6-8 servings of whole grains each day. Less than 6 oz (170 g) of lean meat, poultry, or fish each day. A 3-oz (85-g) serving of meat is about the same size as a deck of cards. One egg equals 1 oz (28 g). 2-3 servings of low-fat dairy each  day. One serving is 1 cup (237 mL). 1 serving of nuts, seeds, or beans 5 times each week. 2-3 servings of heart-healthy fats. Healthy fats called omega-3 fatty acids are found in foods such as walnuts, flaxseeds, fortified milks, and eggs. These fats are also found in cold-water fish, such as sardines, salmon, and mackerel. Limit how much you eat of: Canned or prepackaged foods. Food that is high in trans fat, such as some fried foods. Food that is high in saturated fat, such as fatty meat. Desserts and other sweets, sugary drinks, and other foods with added sugar. Full-fat dairy products. Do not salt foods before eating. Do not eat more than 4 egg yolks a week. Try to eat at least 2 vegetarian meals a week. Eat more home-cooked food and less restaurant, buffet, and fast food. Lifestyle When eating at a restaurant, ask that your food be prepared with less salt or no salt, if possible. If you drink alcohol: Limit how much you use to: 0-1 drink a day for women who are not pregnant. 0-2 drinks a day for men. Be aware of how much alcohol is in your drink. In the [place] one drink equals one 12 oz bottle of beer (355 mL), one 5 oz glass of wine (148 mL), or one 1 oz glass of hard liquor (44 mL). General information Avoid eating more than 2,300 mg of salt a day. If you have hypertension, you may need to reduce your sodium intake to 1,500 mg a day. Work with your health care provider to maintain a healthy  body weight or to lose weight. Ask what an ideal weight is for you. Get at least 30 minutes of exercise that causes your heart to beat faster (aerobic exercise) most days of the week. Activities may include walking, swimming, or biking. Work with your health care provider or dietitian to adjust your eating plan to your individual calorie needs. What foods should I eat? Fruits All fresh, dried, or frozen fruit. Canned fruit in natural juice (without added sugar). Vegetables Fresh or frozen  vegetables (raw, steamed, roasted, or grilled). Low-sodium or reduced-sodium tomato and vegetable juice. Low-sodium or reduced-sodium tomato sauce and tomato paste. Low-sodium or reduced-sodium canned vegetables. Grains Whole-grain or whole-wheat bread. Whole-grain or whole-wheat pasta. Brown rice. [place]. Bulgur. Whole-grain and low-sodium cereals. Pita bread. Low-fat, low-sodium crackers. Whole-wheat flour tortillas. Meats and other proteins Skinless chicken or [place]. Ground chicken or [place]. Pork with fat trimmed off. Fish and seafood. Egg whites. Dried beans, peas, or lentils. Unsalted nuts, nut butters, and seeds. Unsalted canned beans. Lean cuts of beef with fat trimmed off. Low-sodium, lean precooked or cured meat, such as sausages or meat loaves. Dairy Low-fat (1%) or fat-free (skim) milk. Reduced-fat, low-fat, or fat-free cheeses. Nonfat, low-sodium ricotta or cottage cheese. Low-fat or nonfat yogurt. Low-fat, low-sodium cheese. Fats and oils Soft margarine without trans fats. Vegetable oil. Reduced-fat, low-fat, or light mayonnaise and salad dressings (reduced-sodium). Canola, safflower, olive, avocado, soybean, and sunflower oils. Avocado. Seasonings and condiments Herbs. Spices. Seasoning mixes without salt. Other foods Unsalted popcorn and pretzels. Fat-free sweets. The items listed above may not be a complete list of foods and beverages you can eat. Contact a dietitian for more information. What foods should I avoid? Fruits Canned fruit in a light or heavy syrup. Fried fruit. Fruit in cream or butter sauce. Vegetables Creamed or fried vegetables. Vegetables in a cheese sauce. Regular canned vegetables (not low-sodium or reduced-sodium). Regular canned tomato sauce and paste (not low-sodium or reduced-sodium). Regular tomato and vegetable juice (not low-sodium or reduced-sodium). Angie Fava. Olives. Grains Baked goods made with fat, such as croissants, muffins, or some  breads. Dry pasta or rice meal packs. Meats and other proteins Fatty cuts of meat. Ribs. Fried meat. Berniece Salines. Bologna, salami, and other precooked or cured meats, such as sausages or meat loaves. Fat from the back of a pig (fatback). Bratwurst. Salted nuts and seeds. Canned beans with added salt. Canned or smoked fish. Whole eggs or egg yolks. Chicken or [place] with skin. Dairy Whole or 2% milk, cream, and half-and-half. Whole or full-fat cream cheese. Whole-fat or sweetened yogurt. Full-fat cheese. Nondairy creamers. Whipped toppings. Processed cheese and cheese spreads. Fats and oils Butter. Stick margarine. Lard. Shortening. Ghee. Bacon fat. Tropical oils, such as coconut, palm kernel, or palm oil. Seasonings and condiments Onion salt, garlic salt, seasoned salt, table salt, and sea salt. Worcestershire sauce. Tartar sauce. Barbecue sauce. Teriyaki sauce. Soy sauce, including reduced-sodium. Steak sauce. Canned and packaged gravies. Fish sauce. Oyster sauce. Cocktail sauce. Store-bought horseradish. Ketchup. Mustard. Meat flavorings and tenderizers. Bouillon cubes. Hot sauces. Pre-made or packaged marinades. Pre-made or packaged taco seasonings. Relishes. Regular salad dressings. Other foods Salted popcorn and pretzels. The items listed above may not be a complete list of foods and beverages you should avoid. Contact a dietitian for more information. Where to find more information National Heart, Lung, and Blood Institute: https://wilson-eaton.com/ American Heart Association: www.heart.org Academy of Nutrition and Dietetics: www.eatright.[place]: www.kidney.org Summary The DASH eating plan is a healthy eating  plan that has been shown to reduce high blood pressure (hypertension). It may also reduce your risk for type 2 diabetes, heart disease, and stroke. When on the DASH eating plan, aim to eat more fresh fruits and vegetables, whole grains, lean proteins, low-fat dairy, and  heart-healthy fats. With the DASH eating plan, you should limit salt (sodium) intake to 2,300 mg a day. If you have hypertension, you may need to reduce your sodium intake to 1,500 mg a day. Work with your health care provider or dietitian to adjust your eating plan to your individual calorie needs. This information is not intended to replace advice given to you by your health care provider. Make sure you discuss any questions you have with your health care provider. Document Revised: 10/22/2019 Document Reviewed: 10/22/2019 Elsevier Patient Education  [place].

## 2022-07-02 ENCOUNTER — Encounter: Payer: Self-pay | Admitting: Nurse Practitioner

## 2022-07-02 ENCOUNTER — Ambulatory Visit: Payer: Medicare PPO | Admitting: Nurse Practitioner

## 2022-07-02 VITALS — BP 108/67 | HR 54 | Temp 97.6°F | Ht 71.0 in | Wt 200.2 lb

## 2022-07-02 DIAGNOSIS — K200 Eosinophilic esophagitis: Secondary | ICD-10-CM | POA: Diagnosis not present

## 2022-07-02 DIAGNOSIS — I2583 Coronary atherosclerosis due to lipid rich plaque: Secondary | ICD-10-CM

## 2022-07-02 DIAGNOSIS — M8588 Other specified disorders of bone density and structure, other site: Secondary | ICD-10-CM | POA: Diagnosis not present

## 2022-07-02 DIAGNOSIS — D696 Thrombocytopenia, unspecified: Secondary | ICD-10-CM | POA: Diagnosis not present

## 2022-07-02 DIAGNOSIS — I10 Essential (primary) hypertension: Secondary | ICD-10-CM

## 2022-07-02 DIAGNOSIS — D709 Neutropenia, unspecified: Secondary | ICD-10-CM | POA: Diagnosis not present

## 2022-07-02 DIAGNOSIS — I2510 Atherosclerotic heart disease of native coronary artery without angina pectoris: Secondary | ICD-10-CM

## 2022-07-02 DIAGNOSIS — I472 Ventricular tachycardia, unspecified: Secondary | ICD-10-CM

## 2022-07-02 DIAGNOSIS — E7800 Pure hypercholesterolemia, unspecified: Secondary | ICD-10-CM | POA: Diagnosis not present

## 2022-07-02 NOTE — Assessment & Plan Note (Signed)
Chronic, ongoing.  Continue collaboration with hematology, recent note and labs reviewed. 

## 2022-07-02 NOTE — Assessment & Plan Note (Signed)
Chronic, stable at this time with no recent CP. Continue current medication regimen and collaboration with cardiology. No recent NTG use.  

## 2022-07-02 NOTE — Assessment & Plan Note (Signed)
Chronic, ongoing.  Continue current medication regimen and adjust as needed. Lipid panel today. 

## 2022-07-02 NOTE — Progress Notes (Signed)
BP 108/67   Pulse (!) 54   Temp 97.6 F (36.4 C) (Oral)   Ht '5\' 11"'$  (1.803 m)   Wt 200 lb 3.2 oz (90.8 kg)   SpO2 95%   BMI 27.92 kg/m    Subjective:    Patient ID: Shawn Kitchens Sr., male    DOB: 1946/10/17, 76 y.o.   MRN: [id number]  HPI: Shawn CHAIKIN Sr. is a 76 y.o. male  Chief Complaint  Patient presents with   Hyperlipidemia   Hypertension   Gastroesophageal Reflux   HYPERTENSION / HYPERLIPIDEMIA Continues on Ramipril, Metoprolol, Tambocor as needed, Niacin, and Atorvastatin.  Sees cardiology, Dr. Humphrey Rolls, saw last 3-4 weeks ago -- has visits every 4 months.  Had ablation >4 years ago for VT. Has not had to take any Tambocor.    Continues to follow with hematology for leukopenia and thrombocytopenia, last visit 03/21/22.   Satisfied with current treatment? yes Duration of hypertension: chronic BP monitoring frequency: not checking BP range:  BP medication side effects: no Duration of hyperlipidemia: chronic Cholesterol medication side effects: no Cholesterol supplements: niacin Medication compliance: good compliance Aspirin: yes Recent stressors: no Recurrent headaches: no Visual changes: no Palpitations: no Dyspnea: no Chest pain: no recent episodes, one episode months back while playing golf (April) -- had stress test with Dr. Humphrey Rolls Lower extremity edema: at baseline -- takes Lasix Dizzy/lightheaded: no   GERD Saw GI on 06/24/22, to return as needed only.  To continue to follow with ENT.  Diagnosed with eosinophilic esophagitis = taking Flonase, Singulair, Allegra, Protonix. GERD control status: stable Satisfied with current treatment? yes Heartburn frequency: none Medication side effects: no  Medication compliance: better Dysphagia: no Odynophagia:  no Hematemesis: no Blood in stool: no EGD: yes   OSTEOPENIA Last DEXA in 2018 noting T-score -2.2.  Takes daily supplements. Satisfied with current treatment?: yes Adequate calcium & vitamin D:  yes Weight bearing exercises: yes   Relevant past medical, surgical, family and social history reviewed and updated as indicated. Interim medical history since our last visit reviewed. Allergies and medications reviewed and updated.  Review of Systems  Constitutional:  Negative for activity change, diaphoresis, fatigue and fever.  Respiratory:  Negative for cough, chest tightness, shortness of breath and wheezing.   Cardiovascular:  Negative for chest pain, palpitations and leg swelling.  Gastrointestinal: Negative.   Endocrine: Negative.   Neurological: Negative.   Psychiatric/Behavioral: Negative.      Per HPI unless specifically indicated above     Objective:    BP 108/67   Pulse (!) 54   Temp 97.6 F (36.4 C) (Oral)   Ht '5\' 11"'$  (1.803 m)   Wt 200 lb 3.2 oz (90.8 kg)   SpO2 95%   BMI 27.92 kg/m   Wt Readings from Last 3 Encounters:  07/02/22 200 lb 3.2 oz (90.8 kg)  06/24/22 199 lb 6.4 oz (90.4 kg)  05/02/22 197 lb 9.6 oz (89.6 kg)    Physical Exam  Results for orders placed or performed in visit on 04/15/22  Food Allergy Profile  Result Value Ref Range   Class Description Allergens Comment    Egg White IgE 0.13 (A) Class 0/I kU/L   Peanut IgE 0.10 (A) Class 0/I kU/L   Soybean IgE <0.10 Class 0 kU/L   Milk IgE <0.10 Class 0 kU/L   Clam IgE <0.10 Class 0 kU/L   Shrimp IgE <0.10 Class 0 kU/L   Walnut IgE <0.10 Class 0 kU/L  Codfish IgE <0.10 Class 0 kU/L   Scallop IgE <0.10 Class 0 kU/L   Wheat IgE 0.11 (A) Class 0/I kU/L   Allergen Corn, IgE 0.11 (A) Class 0/I kU/L   Sesame Seed IgE <0.10 Class 0 kU/L  Celiac Disease Panel  Result Value Ref Range   Endomysial IgA Negative Negative   Transglutaminase IgA <2 0 - 3 U/mL   IgA/Immunoglobulin A, Serum 169 61 - 437 mg/dL      Assessment & Plan:   Problem List Items Addressed This Visit       Cardiovascular and Mediastinum   CAD (coronary artery disease)    Chronic, stable at this time with no recent CP.   Continue current medication regimen and collaboration with cardiology.  No recent NTG use.        Relevant Orders   Comprehensive metabolic panel   Lipid Panel w/o Chol/HDL Ratio   Essential hypertension    Chronic, stable with BP remaining at goal in office.  Recommend he monitor BP at least a few mornings a week at home and document.  DASH diet at home.  Continue current medication regimen and adjust as needed.  Labs today: CMP.  Return in 6 months.       Relevant Orders   Comprehensive metabolic panel   Ventricular tachycardia (HCC) - Primary    Chronic, ongoing.  Continue collaboration with cardiology, no recent use Tambocor.  Attempt to get recent notes from Dr. Humphrey Rolls.      Relevant Orders   Comprehensive metabolic panel   Lipid Panel w/o Chol/HDL Ratio     Digestive   Eosinophilic esophagitis    Chronic, diagnosed 04/01/22 by Dr. Vicente Males.  At this time continue collaboration with GI as needed and ENT + continue current medication regimen.  Recent notes reviewed.      Relevant Orders   Magnesium     Musculoskeletal and Integument   Osteopenia    Chronic, ongoing.  Recommend continue Vitamin D supplement and calcium daily.  Educated on this.  Obtain DEXA, discussed with patient.      Relevant Orders   DG Bone Density     Hematopoietic and Hemostatic   Thrombocytopenia (HCC)    Chronic, stable.  Continue collaboration with hematology, recent labs and note reviewed.        Other   Hyperlipidemia    Chronic, ongoing.  Continue current medication regimen and adjust as needed.  Lipid panel today.         Relevant Orders   Comprehensive metabolic panel   Lipid Panel w/o Chol/HDL Ratio   Leukopenia    Chronic, ongoing.  Continue collaboration with hematology, recent note and labs reviewed.        Follow up plan: Return in about 6 months (around 01/02/2023) for Annual physical after 01/01/23.

## 2022-07-02 NOTE — Assessment & Plan Note (Signed)
Chronic, stable with BP remaining at goal in office.  Recommend he monitor BP at least a few mornings a week at home and document.  DASH diet at home.  Continue current medication regimen and adjust as needed.  Labs today: CMP.  Return in 6 months.

## 2022-07-02 NOTE — Assessment & Plan Note (Addendum)
Chronic, stable.  Continue collaboration with hematology, recent labs and note reviewed.

## 2022-07-02 NOTE — Assessment & Plan Note (Signed)
Chronic, ongoing.  Recommend continue Vitamin D supplement and calcium daily.  Educated on this.  Obtain DEXA, discussed with patient.

## 2022-07-02 NOTE — Assessment & Plan Note (Addendum)
Chronic, ongoing.  Continue collaboration with cardiology, no recent use Tambocor.  Attempt to get recent notes from Dr. Humphrey Rolls.

## 2022-07-02 NOTE — Assessment & Plan Note (Signed)
Chronic, diagnosed 04/01/22 by Dr. Vicente Males.  At this time continue collaboration with GI as needed and ENT + continue current medication regimen.  Recent notes reviewed.

## 2022-07-03 LAB — COMPREHENSIVE METABOLIC PANEL
ALT: 17 IU/L (ref 0–44)
AST: 20 IU/L (ref 0–40)
Albumin/Globulin Ratio: 2.2 (ref 1.2–2.2)
Albumin: 4.1 g/dL (ref 3.8–4.8)
Alkaline Phosphatase: 79 IU/L (ref 44–121)
BUN/Creatinine Ratio: 12 (ref 10–24)
BUN: 11 mg/dL (ref 8–27)
Bilirubin Total: 0.9 mg/dL (ref 0.0–1.2)
CO2: 24 mmol/L (ref 20–29)
Calcium: 10.2 mg/dL (ref 8.6–10.2)
Chloride: 104 mmol/L (ref 96–106)
Creatinine, Ser: 0.89 mg/dL (ref 0.76–1.27)
Globulin, Total: 1.9 g/dL (ref 1.5–4.5)
Glucose: 101 mg/dL — ABNORMAL HIGH (ref 70–99)
Potassium: 4.4 mmol/L (ref 3.5–5.2)
Sodium: 139 mmol/L (ref 134–144)
Total Protein: 6 g/dL (ref 6.0–8.5)
eGFR: 90 mL/min/{1.73_m2} (ref >59)

## 2022-07-03 LAB — LIPID PANEL W/O CHOL/HDL RATIO
Cholesterol, Total: 120 mg/dL (ref 100–199)
HDL: 52 mg/dL (ref >39)
LDL Chol Calc (NIH): 54 mg/dL (ref 0–99)
Triglycerides: 69 mg/dL (ref 0–149)
VLDL Cholesterol Cal: 14 mg/dL (ref 5–40)

## 2022-07-03 LAB — MAGNESIUM: Magnesium: 2 mg/dL (ref 1.6–2.3)

## 2022-07-03 NOTE — Progress Notes (Signed)
Contacted via MyChart   Good evening Shawn Shepard, your labs have returned and overall they are remaining stable.  Great job!!  No changes needed. Keep being amazing!!  Thank you for allowing me to participate in your care.  I appreciate you. Kindest regards, Danielys Madry

## 2022-07-21 IMAGING — CR DG CHEST 2V
1 series · 2 of 2 positions shown · non-contrast
Comparison: 08/10/2017

CLINICAL DATA: Chest pain, tachycardia

EXAM:
CHEST - 2 VIEW

[Series 1: dg chest 2 view · 0.14mm/px · 2 of 2 slices shown]
[im 1/2]
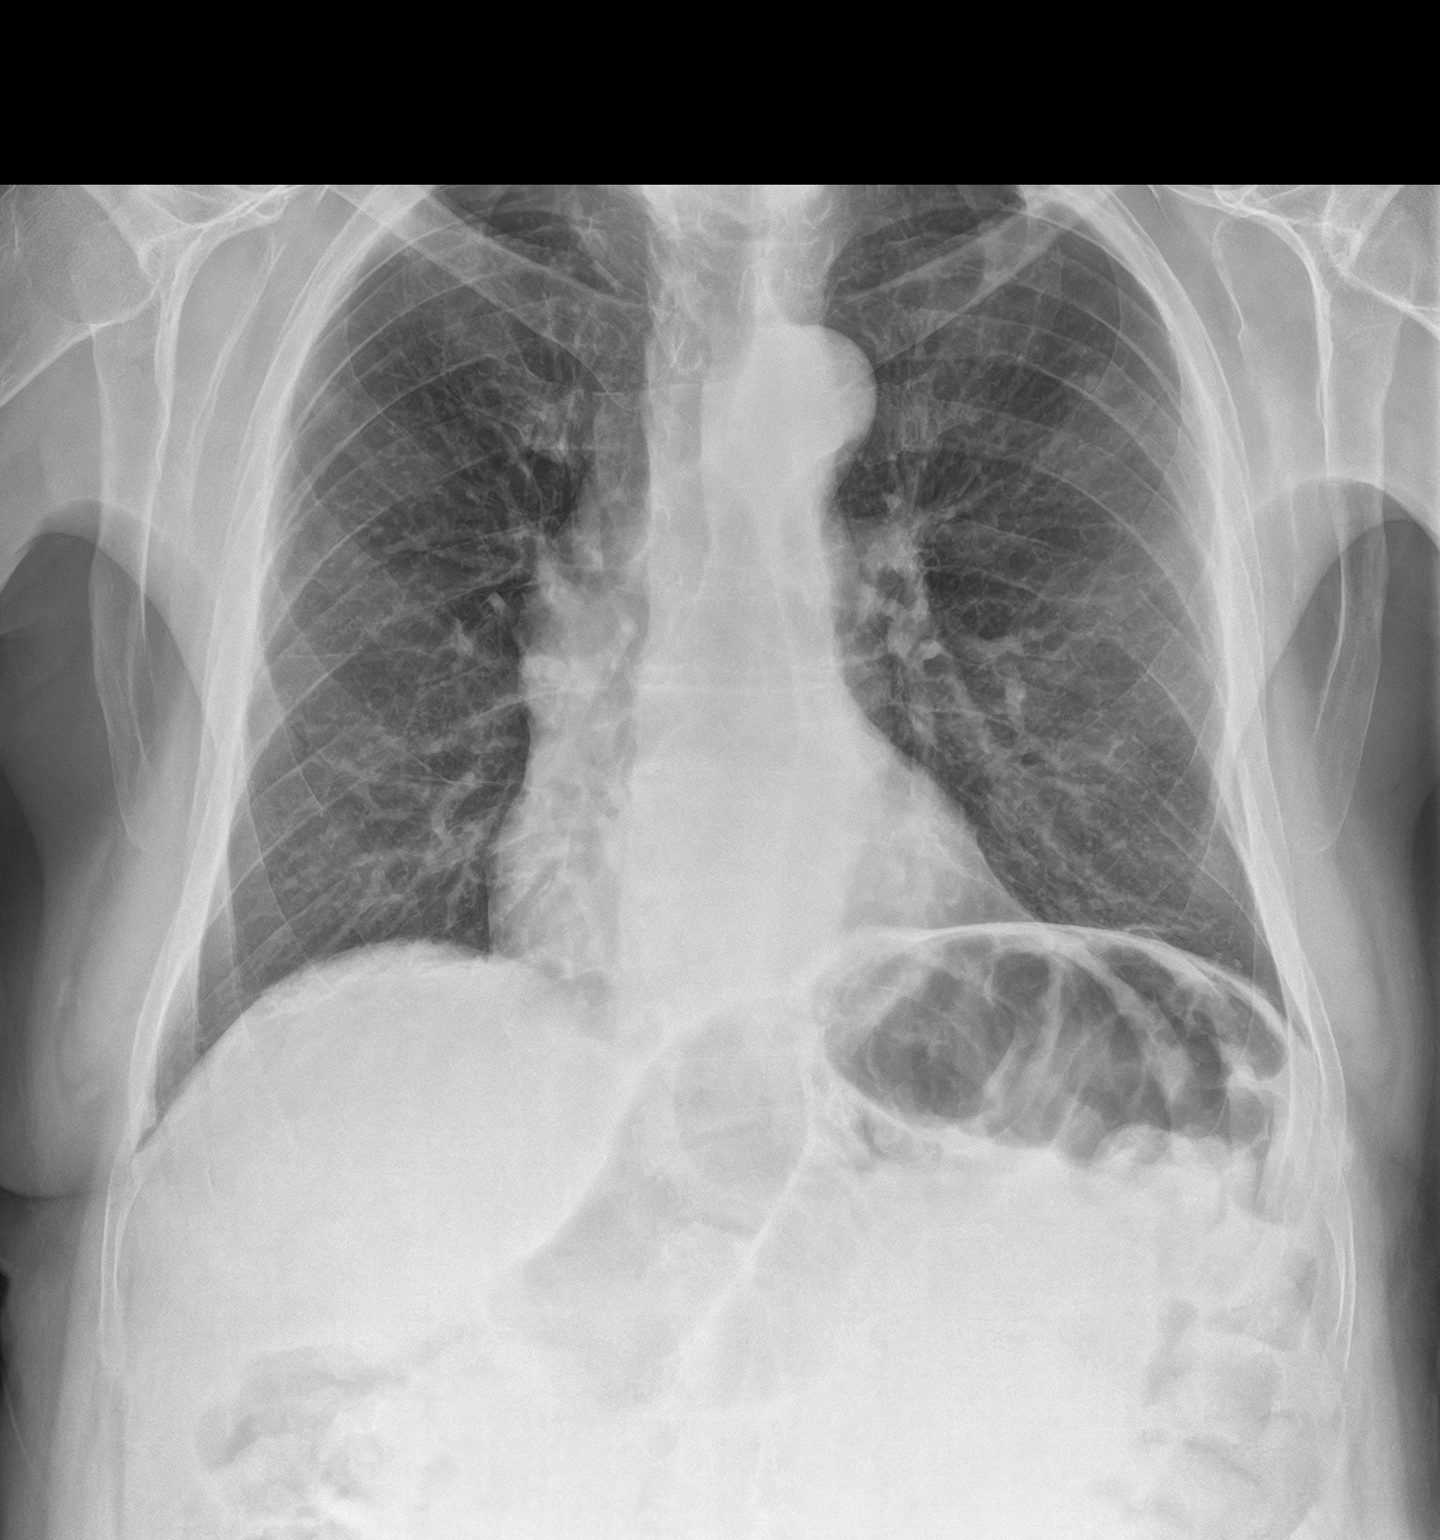
[im 2/2]
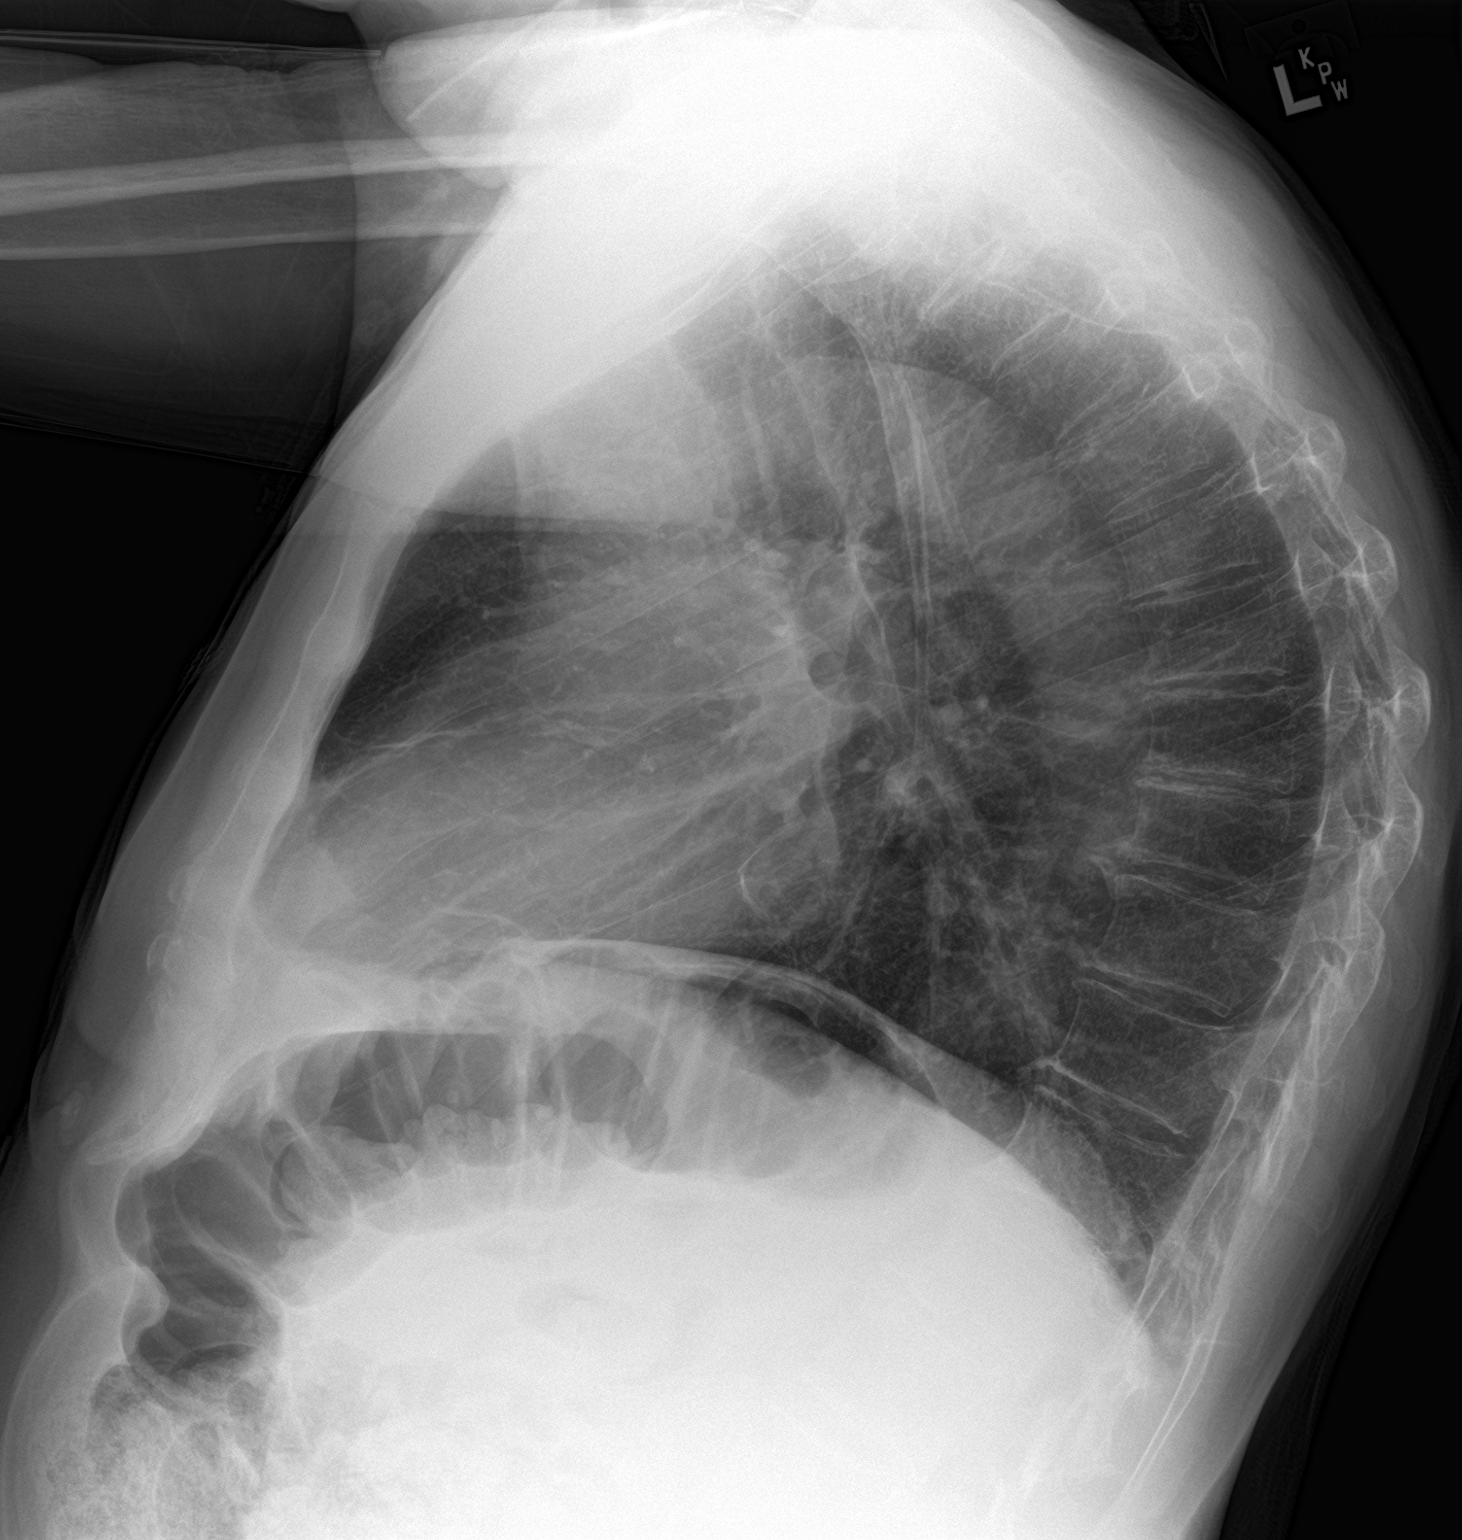

[2 of 2 positions shown; findings below may reference images not displayed]

FINDINGS: Frontal and lateral views of the chest demonstrate an unremarkable
cardiac silhouette. No acute airspace disease, effusion, or
pneumothorax. Possible 7 mm nodule overlying the left anterior
second rib. No acute bony abnormalities.
IMPRESSION: 1. No acute intrathoracic process.
2. Possible 7 mm left upper lobe pulmonary nodule. Nonemergent
follow-up CT chest could be performed for further evaluation.

## 2022-07-30 DIAGNOSIS — R609 Edema, unspecified: Secondary | ICD-10-CM | POA: Diagnosis not present

## 2022-07-30 DIAGNOSIS — I10 Essential (primary) hypertension: Secondary | ICD-10-CM | POA: Diagnosis not present

## 2022-07-30 DIAGNOSIS — I340 Nonrheumatic mitral (valve) insufficiency: Secondary | ICD-10-CM | POA: Diagnosis not present

## 2022-07-30 DIAGNOSIS — I739 Peripheral vascular disease, unspecified: Secondary | ICD-10-CM | POA: Diagnosis not present

## 2022-07-30 DIAGNOSIS — E782 Mixed hyperlipidemia: Secondary | ICD-10-CM | POA: Diagnosis not present

## 2022-07-30 DIAGNOSIS — I2510 Atherosclerotic heart disease of native coronary artery without angina pectoris: Secondary | ICD-10-CM | POA: Diagnosis not present

## 2022-07-30 DIAGNOSIS — I351 Nonrheumatic aortic (valve) insufficiency: Secondary | ICD-10-CM | POA: Diagnosis not present

## 2022-08-06 ENCOUNTER — Ambulatory Visit
Admission: RE | Admit: 2022-08-06 | Discharge: 2022-08-06 | Disposition: A | Discharge status: Home / Self Care | Payer: Medicare PPO | Source: Ambulatory Visit | Attending: Nurse Practitioner | Admitting: Nurse Practitioner

## 2022-08-06 ENCOUNTER — Encounter: Payer: Self-pay | Admitting: Nurse Practitioner

## 2022-08-06 DIAGNOSIS — M85851 Other specified disorders of bone density and structure, right thigh: Secondary | ICD-10-CM | POA: Diagnosis not present

## 2022-08-06 DIAGNOSIS — M810 Age-related osteoporosis without current pathological fracture: Secondary | ICD-10-CM | POA: Diagnosis not present

## 2022-08-06 DIAGNOSIS — M8588 Other specified disorders of bone density and structure, other site: Secondary | ICD-10-CM | POA: Insufficient documentation

## 2022-08-06 NOTE — Progress Notes (Signed)
Contacted via [place] -- needs visit scheduled to discuss osteoporosis treatment   Good morning Shawn Shepard, your bone density screening has returned and is showing progression to osteoporosis (bones are brittle putting at risk for fracture easily).  I do want you to scheduled a visit so we can discuss treatment regimen for this.  I will have staff call you.  Any questions? Keep being amazing!!  Thank you for allowing me to participate in your care.  I appreciate you. Kindest regards, Savannha Welle

## 2022-08-12 DIAGNOSIS — D485 Neoplasm of uncertain behavior of skin: Secondary | ICD-10-CM | POA: Diagnosis not present

## 2022-08-12 DIAGNOSIS — Z08 Encounter for follow-up examination after completed treatment for malignant neoplasm: Secondary | ICD-10-CM | POA: Diagnosis not present

## 2022-08-12 DIAGNOSIS — L814 Other melanin hyperpigmentation: Secondary | ICD-10-CM | POA: Diagnosis not present

## 2022-08-12 DIAGNOSIS — L821 Other seborrheic keratosis: Secondary | ICD-10-CM | POA: Diagnosis not present

## 2022-08-12 DIAGNOSIS — R600 Localized edema: Secondary | ICD-10-CM | POA: Diagnosis not present

## 2022-08-12 DIAGNOSIS — L570 Actinic keratosis: Secondary | ICD-10-CM | POA: Diagnosis not present

## 2022-08-12 DIAGNOSIS — I8391 Asymptomatic varicose veins of right lower extremity: Secondary | ICD-10-CM | POA: Diagnosis not present

## 2022-08-12 DIAGNOSIS — I8393 Asymptomatic varicose veins of bilateral lower extremities: Secondary | ICD-10-CM | POA: Diagnosis not present

## 2022-08-12 DIAGNOSIS — D1801 Hemangioma of skin and subcutaneous tissue: Secondary | ICD-10-CM | POA: Diagnosis not present

## 2022-09-20 ENCOUNTER — Inpatient Hospital Stay (HOSPITAL_BASED_OUTPATIENT_CLINIC_OR_DEPARTMENT_OTHER): Payer: Medicare PPO | Admitting: Oncology

## 2022-09-20 ENCOUNTER — Inpatient Hospital Stay: Payer: Medicare PPO | Attending: Oncology

## 2022-09-20 ENCOUNTER — Encounter: Payer: Self-pay | Admitting: Oncology

## 2022-09-20 VITALS — BP 130/73 | HR 48 | Temp 95.4°F | Resp 20 | Wt 196.8 lb

## 2022-09-20 DIAGNOSIS — C9110 Chronic lymphocytic leukemia of B-cell type not having achieved remission: Secondary | ICD-10-CM | POA: Insufficient documentation

## 2022-09-20 DIAGNOSIS — R001 Bradycardia, unspecified: Secondary | ICD-10-CM | POA: Insufficient documentation

## 2022-09-20 DIAGNOSIS — D72820 Lymphocytosis (symptomatic): Secondary | ICD-10-CM

## 2022-09-20 DIAGNOSIS — D696 Thrombocytopenia, unspecified: Secondary | ICD-10-CM | POA: Diagnosis not present

## 2022-09-20 DIAGNOSIS — D708 Other neutropenia: Secondary | ICD-10-CM | POA: Diagnosis not present

## 2022-09-20 DIAGNOSIS — Z79899 Other long term (current) drug therapy: Secondary | ICD-10-CM | POA: Diagnosis not present

## 2022-09-20 DIAGNOSIS — R5383 Other fatigue: Secondary | ICD-10-CM | POA: Diagnosis not present

## 2022-09-20 DIAGNOSIS — D709 Neutropenia, unspecified: Secondary | ICD-10-CM

## 2022-09-20 LAB — CBC WITH DIFFERENTIAL/PLATELET
Abs Immature Granulocytes: 0.01 10*3/uL (ref 0.00–0.07)
Basophils Absolute: 0 10*3/uL (ref 0.0–0.1)
Basophils Relative: 1 %
Eosinophils Absolute: 0.2 10*3/uL (ref 0.0–0.5)
Eosinophils Relative: 7 %
HCT: 40.9 % (ref 39.0–52.0)
Hemoglobin: 13.5 g/dL (ref 13.0–17.0)
Immature Granulocytes: 0 %
Lymphocytes Relative: 25 %
Lymphs Abs: 0.7 10*3/uL (ref 0.7–4.0)
MCH: 31.6 pg (ref 26.0–34.0)
MCHC: 33 g/dL (ref 30.0–36.0)
MCV: 95.8 fL (ref 80.0–100.0)
Monocytes Absolute: 0.5 10*3/uL (ref 0.1–1.0)
Monocytes Relative: 15 %
Neutro Abs: 1.5 10*3/uL — ABNORMAL LOW (ref 1.7–7.7)
Neutrophils Relative %: 52 %
Platelets: 148 10*3/uL — ABNORMAL LOW (ref 150–400)
RBC: 4.27 MIL/uL (ref 4.22–5.81)
RDW: 13.7 % (ref 11.5–15.5)
WBC: 2.9 10*3/uL — ABNORMAL LOW (ref 4.0–10.5)
nRBC: 0 % (ref 0.0–0.2)

## 2022-09-20 LAB — COMPREHENSIVE METABOLIC PANEL
ALT: 20 U/L (ref 0–44)
AST: 24 U/L (ref 15–41)
Albumin: 3.7 g/dL (ref 3.5–5.0)
Alkaline Phosphatase: 65 U/L (ref 38–126)
Anion gap: 3 — ABNORMAL LOW (ref 5–15)
BUN: 13 mg/dL (ref 8–23)
CO2: 28 mmol/L (ref 22–32)
Calcium: 9.9 mg/dL (ref 8.9–10.3)
Chloride: 106 mmol/L (ref 98–111)
Creatinine, Ser: 0.89 mg/dL (ref 0.61–1.24)
GFR, Estimated: >60 mL/min (ref >60)
Glucose, Bld: 99 mg/dL (ref 70–99)
Potassium: 4.5 mmol/L (ref 3.5–5.1)
Sodium: 137 mmol/L (ref 135–145)
Total Bilirubin: 1.3 mg/dL — ABNORMAL HIGH (ref 0.3–1.2)
Total Protein: 6.7 g/dL (ref 6.5–8.1)

## 2022-09-20 NOTE — Assessment & Plan Note (Signed)
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  No constitutional symptoms.   Recommend observation.

## 2022-09-20 NOTE — Assessment & Plan Note (Signed)
Likely secondary to metoprolol.  This may contribute to his fatigue feeling.  Recommend patient to further discuss with his cardiologist

## 2022-09-20 NOTE — Progress Notes (Signed)
Hematology/Oncology Progress note [phone] [phone]      Patient Care Team: Venita Lick, NP as PCP - General (Nurse Practitioner) [name], MD as Consulting Physician (Cardiology) [name], MD as Consulting Physician (Oncology)  ASSESSMENT & PLAN:   Leukopenia Chronic leukopenia, predominantly neutropenia Previous work-up -including bone marrow biopsy were nonremarkable. 10/26/2019 bone marrow biopsy showed no significant dyspoiesis or increase in blast cells.  No significant lymphoid aggregates.  Normal cytogenetics. Labs reviewed and discussed with patient ANC is stable.  Continue observation.  Thrombocytopenia ([place])  very mild decrease of platelet count.  No intervention needed.  Continue observation.    Monoclonal B-cell lymphocytosis of undetermined significance  Previous peripheral blood flow cytometry showed 2 minute CD5/CD23 positive monoclonal B-cell population, CLL/SLL phenotype.  No constitutional symptoms.   Recommend observation.  Bradycardia Likely secondary to metoprolol.  This may contribute to his fatigue feeling.  Recommend patient to further discuss with his cardiologist  Orders Placed This Encounter  Procedures   CBC with Differential/Platelet    Standing Status:   Future    Standing Expiration Date:   09/21/2023   Comprehensive metabolic panel    Standing Status:   Future    Standing Expiration Date:   09/21/2023   Lactate dehydrogenase    Standing Status:   Future    Standing Expiration Date:   09/21/2023   Flow cytometry panel-leukemia/lymphoma work-up    Standing Status:   Future    Standing Expiration Date:   09/21/2023  Follow-up 6 months   all questions were answered. The patient knows to call the clinic with any problems, questions or concerns.  [name], MD, PhD [facility] Hematology Oncology 09/20/2022   CHIEF COMPLAINTS/REASON FOR VISIT:  Follow-up for management of leukopenia and  thrombocytopenia,   HISTORY OF PRESENTING ILLNESS:  Shawn Kitchens Sr. is a  76 y.o.  male with PMH listed below who was referred to me for evaluation of leukopenia. Reviewed patient's recent labs. Patient has low total WBC count was 2.6, predominantly absolute lymphocyte 0.6. Earlier lab lab done 11/2019 showed WBC 2.8, lymphocyte 0.7, neutrophil 1.4.  Previous lab records reviewed. 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 No aggravating or improving factors.  Associated symptoms:  denies fatigue, weight loss, fever, chills, frequent infection.  History hepatitis or HIV infection: Denies History of chronic liver disease: Denies History of blood transfusion: Denies Alcohol consumption: Denies Diet Vegetarian or Vegan: Denies Herbal medication: Denies  Reports feeling quite tired and fatigued recently.  He and his wife recently downsized to a one-story home and he has been moving a lot of furniture's by himself.  He feels it took a week for his knee to recover after the most. Denies any unintentional weight loss, fever or chills, night sweating. He is very active, walks every day. Previous occupation was a Product/process development scientist.  Drinks a cocktail every day.  Usually drinks cocktail with quinine tonic water for lower extremity cramps.  He started to do this after his ventricular tachycardic episodes about 1-1/2 years ago. Former smoker, quitting in 1972.   chronic back pain and had a lumbar spine x-ray done on 12/20/2020 which showed multilevel moderate lumbar degenerative disc disease, spine spondylolisthesis, facet arthropathy  INTERVAL HISTORY Cinda Quest Zavada Sr. is a 76 y.o. male who has above history reviewed by me today presents for follow up visit for management of leukopenia and thrombocytopenia. + fatigue. No dizziness, lightheaded. His metoprolol dose was  increased.  Heart rate 48 today.  Patient has intentionally lost a few pounds by eating  healthy.  He was accompanied by his wife. Patient has received his influenza vaccination 2 weeks ago.  Review of Systems  Constitutional:  Positive for malaise/fatigue. Negative for chills, fever and weight loss.  HENT:  Negative for sore throat.   Eyes:  Negative for redness.  Respiratory:  Negative for cough, shortness of breath and wheezing.   Cardiovascular:  Negative for chest pain, palpitations and leg swelling.  Gastrointestinal:  Negative for abdominal pain, blood in stool, nausea and vomiting.  Genitourinary:  Negative for dysuria.  Musculoskeletal:  Negative for myalgias.  Skin:  Negative for rash.  Neurological:  Negative for dizziness, tingling and tremors.  Endo/Heme/Allergies:  Does not bruise/bleed easily.  Psychiatric/Behavioral:  Negative for hallucinations.     MEDICAL HISTORY:  Past Medical History:  Diagnosis Date   Arthritis    Basal cell carcinoma 1999   basal cell/skin   CAD (coronary artery disease)    Cataract Cataract surgery 2017   GERD (gastroesophageal reflux disease)    History of bone marrow biopsy    History of kidney stones 2013   passed stone on his own   Hyperlipidemia    Hypertension    Leukopenia    Shingles    Ventricular tachycardia ([place])     SURGICAL HISTORY: Past Surgical History:  Procedure Laterality Date   CARDIAC CATHETERIZATION N/A 01/16/2016   Procedure: Left Heart Cath and Coronary Angiography;  Surgeon: [name], MD;  Location: [facility];  Service: Cardiovascular;  Laterality: N/A;   CATARACT EXTRACTION Left 2017   COLONOSCOPY WITH PROPOFOL N/A 03/27/2020   Procedure: COLONOSCOPY WITH PROPOFOL;  Surgeon: [name], MD;  Location: [place];  Service: Endoscopy;  Laterality: N/A;  priority 4   CORONARY ANGIOPLASTY  2000 and 2003   ESOPHAGOGASTRODUODENOSCOPY (EGD) WITH PROPOFOL N/A 04/01/2022   Procedure: ESOPHAGOGASTRODUODENOSCOPY (EGD) WITH PROPOFOL;  Surgeon: Jonathon Bellows, MD;  [facility];  Service: Gastroenterology;  Laterality: N/A;   EYE SURGERY  2017   Cateract   EYE SURGERY Left 04/09/2022   GANGLION CYST EXCISION  1979   HAND SURGERY Right 02/08/2021   blood clot removal per patient   KNEE ARTHROSCOPY WITH MEDIAL MENISECTOMY Left 12/01/2017   Procedure: KNEE ARTHROSCOPY WITH MEDIAL MENISECTOMY;  Surgeon: [name], MD;  Location: [facility];  Service: Orthopedics;  Laterality: Left;  Partial   LEFT HEART CATH Right 08/07/2017   Procedure: Left Heart Cath;  Surgeon: [name], MD;  Location: [place] [facility];  Service: Cardiovascular;  Laterality: Right;   PILONIDAL CYST EXCISION  1977   POLYPECTOMY  03/27/2020   Procedure: POLYPECTOMY;  Surgeon: [name], MD;  Location: [place];  Service: Endoscopy;;   SKIN CANCER EXCISION  1999   ear   SYNOVECTOMY Left 12/01/2017   Procedure: SYNOVECTOMY;  Surgeon: [name], MD;  Location: [facility];  Service: Orthopedics;  Laterality: Left;  Partial   V TACH ABLATION N/A 08/14/2017   Procedure: V Tach Ablation;  Surgeon: [name], MD;  Location: [facility];  Service: Cardiovascular;  Laterality: N/A;   VEIN SURGERY  2000   laser    SOCIAL HISTORY: Social History   Socioeconomic History   Marital status: Married    Spouse name: Not on file   Number of children: 2   Years of education: Not on file  Highest education level: Master's degree (e.g., MA, MS, MEng, MEd, MSW, MBA)  Occupational History   Occupation: retired  Tobacco Use   Smoking status: Former    Packs/day: 1.00    Years: 5.00    Total pack years: 5.00    Types: Cigarettes    Quit date: 06/21/1971    Years since quitting: 51.2   Smokeless tobacco: Former    Types: Chew    Quit date: 06/20/1980  Vaping Use   Vaping Use: Never used  Substance and Sexual Activity   Alcohol use: Not Currently    Alcohol/week: 0.0 standard drinks of alcohol   Drug use: No   Sexual activity: Not Currently   Other Topics Concern   Not on file  Social History Narrative   Lives in [place]   Retired school principal   Social Determinants of Health   Financial Resource Strain: [place]  (10/16/2021)   Overall Financial Resource Strain (CARDIA)    Difficulty of Paying Living Expenses: Not hard at all  Food Insecurity: No Food Insecurity (10/16/2021)   Hunger Vital Sign    Worried About Running Out of Food in the Last Year: Never true    [place] in the Last Year: Never true  Transportation Needs: No Transportation Needs (10/16/2021)   PRAPARE - Hydrologist (Medical): No    Lack of Transportation (Non-Medical): No  Physical Activity: Unknown (10/16/2021)   Exercise Vital Sign    Days of Exercise per Week: Not on file    Minutes of Exercise per Session: 40 min  Stress: No Stress Concern Present (10/16/2021)   [place] of Stress : Not at all  Social Connections: [place] (10/16/2021)   Social Connection and Isolation Panel [NHANES]    Frequency of Communication with Friends and Family: More than three times a week    Frequency of Social Gatherings with Friends and Family: More than three times a week    Attends Religious Services: More than 4 times per year    Active Member of [employer] or Organizations: Yes    Attends Music therapist: More than 4 times per year    Marital Status: Married  Human resources officer Violence: Not At Risk (10/16/2021)   Humiliation, Afraid, Rape, and Kick questionnaire    Fear of Current or Ex-Partner: No    Emotionally Abused: No    Physically Abused: No    Sexually Abused: No    FAMILY HISTORY: Family History  Problem Relation Age of Onset   Hypertension Mother    Heart disease Mother    Heart attack Mother    Diabetes Mother    Alzheimer's disease Father    Alzheimer's disease Brother    Congestive Heart Failure  Brother    Hypertension Brother    Other Brother        dementia   Heart disease Brother    Brain cancer Daughter 27   Cancer Daughter    Heart disease Son    Cancer Maternal Uncle    Cancer Maternal Grandfather     ALLERGIES:  is allergic to eggs or egg-derived products and peanut (diagnostic).  MEDICATIONS:  Current Outpatient Medications  Medication Sig Dispense Refill   acetaminophen (TYLENOL) 500 MG tablet Take 1,000 mg by mouth every 6 (six) hours as needed for moderate pain or headache.     ALLERGY RELIEF 180 MG tablet  TAKE 1 TABLET BY MOUTH EVERY DAY 90 tablet 1   aspirin EC 81 MG tablet Take 81 mg by mouth daily.     atorvastatin (LIPITOR) 80 MG tablet Take 1 tablet (80 mg total) by mouth at bedtime. 90 tablet 4   Cholecalciferol (VITAMIN D3) 50 MCG (2000 UT) CHEW Chew 1 tablet by mouth daily.     flecainide (TAMBOCOR) 100 MG tablet Take 2 tablets (200 mg total) as needed by mouth. As needed for recurrent tachycardia.  Do not repeat more than once per day or 3x per wk 2 tablet 3   fluticasone (FLOVENT HFA) 220 MCG/ACT inhaler Inhale 2 puffs into the lungs in the morning and at bedtime. Inhale 2 puffs into the lungs and swallow for 8 weeks. Then rinse your mouth. 1 each 1   furosemide (LASIX) 20 MG tablet Take 20 mg by mouth.     metoprolol succinate (TOPROL-XL) 50 MG 24 hr tablet Take 1 tablet by mouth daily.     montelukast (SINGULAIR) 10 MG tablet Take 1 tablet (10 mg total) by mouth at bedtime. 90 tablet 4   niacin (NIASPAN) 1000 MG CR tablet TAKE 1 TABLET(1000 MG) BY MOUTH AT BEDTIME 90 tablet 4   nitroGLYCERIN (NITROSTAT) 0.4 MG SL tablet      pantoprazole (PROTONIX) 40 MG tablet TAKE 1 TABLET(40 MG) BY MOUTH DAILY 90 tablet 4   ramipril (ALTACE) 10 MG capsule Take 10 mg by mouth daily.     vitamin B-12 (CYANOCOBALAMIN) 1000 MCG tablet Take 1 tablet (1,000 mcg total) by mouth daily. 90 tablet 0   No current facility-administered medications for this visit.      PHYSICAL EXAMINATION: ECOG PERFORMANCE STATUS: 0 - Asymptomatic Vitals:   09/20/22 0952  BP: 130/73  Pulse: (!) 48  Resp: 20  Temp: (!) 95.4 F (35.2 C)  SpO2: 100%   Filed Weights   09/20/22 0952  Weight: 196 lb 12.8 oz (89.3 kg)    Physical Exam Constitutional:      General: He is not in acute distress. HENT:     Head: Normocephalic and atraumatic.  Eyes:     General: No scleral icterus.    Pupils: Pupils are equal, round, and reactive to light.  Cardiovascular:     Rate and Rhythm: Normal rate and regular rhythm.     Heart sounds: Normal heart sounds.  Pulmonary:     Effort: Pulmonary effort is normal. No respiratory distress.     Breath sounds: No wheezing.  Abdominal:     General: Bowel sounds are normal. There is no distension.     Palpations: Abdomen is soft. There is no mass.     Tenderness: There is no abdominal tenderness.  Musculoskeletal:        General: No deformity. Normal range of motion.     Cervical back: Normal range of motion and neck supple.  Skin:    General: Skin is warm and dry.     Findings: No erythema or rash.  Neurological:     Mental Status: He is alert and oriented to person, place, and time.     Cranial Nerves: No cranial nerve deficit.     Coordination: Coordination normal.  Psychiatric:        Behavior: Behavior normal.        Thought Content: Thought content normal.      LABORATORY DATA:  I have reviewed the data as listed    Latest Ref Rng & Units 09/20/2022  9:42 AM 03/25/2022    3:48 PM 03/21/2022    9:51 AM  CBC  WBC 4.0 - 10.5 K/uL 2.9  2.8  2.7   Hemoglobin 13.0 - 17.0 g/dL 13.5  13.0  13.2   Hematocrit 39.0 - 52.0 % 40.9  37.2  40.5   Platelets 150 - 400 K/uL 148  160  144       Latest Ref Rng & Units 09/20/2022    9:42 AM 07/02/2022    9:01 AM 03/25/2022    3:48 PM  CMP  Glucose 70 - 99 mg/dL 99  101  100   BUN 8 - 23 mg/dL '13  11  9   ' Creatinine 0.61 - 1.24 mg/dL 0.89  0.89  0.79   Sodium 135 - 145  mmol/L 137  139  140   Potassium 3.5 - 5.1 mmol/L 4.5  4.4  4.5   Chloride 98 - 111 mmol/L 106  104  104   CO2 22 - 32 mmol/L '28  24  25   ' Calcium 8.9 - 10.3 mg/dL 9.9  10.2  10.3   Total Protein 6.5 - 8.1 g/dL 6.7  6.0  6.1   Total Bilirubin 0.3 - 1.2 mg/dL 1.3  0.9  0.7   Alkaline Phos 38 - 126 U/L 65  79  73   AST 15 - 41 U/L '24  20  29   ' ALT 0 - 44 U/L '20  17  21      ' Iron/TIBC/Ferritin/ %Sat No results found for: "IRON", "TIBC", "FERRITIN", "IRONPCTSAT"

## 2022-09-20 NOTE — Assessment & Plan Note (Signed)
Chronic leukopenia, predominantly neutropenia Previous work-up -including bone marrow biopsy were nonremarkable. 10/26/2019 bone marrow biopsy showed no significant dyspoiesis or increase in blast cells.  No significant lymphoid aggregates.  Normal cytogenetics. Labs reviewed and discussed with patient ANC is stable.  Continue observation. 

## 2022-09-20 NOTE — Assessment & Plan Note (Signed)
very mild decrease of platelet count.  No intervention needed.  Continue observation.

## 2022-09-20 NOTE — Progress Notes (Signed)
Patient states he more fatigued.

## 2022-10-09 ENCOUNTER — Telehealth: Payer: Self-pay | Admitting: Nurse Practitioner

## 2022-10-09 NOTE — Telephone Encounter (Signed)
Left message for patient to call back and schedule the Medicare Annual Wellness Visit (AWV) virtually or by telephone.  Last AWV 10/16/21  Please schedule at anytime with [facility]-Nurse Health Advisor.   Any questions, please call me at [phone]

## 2022-10-16 ENCOUNTER — Other Ambulatory Visit: Payer: Self-pay | Admitting: Nurse Practitioner

## 2022-10-16 NOTE — Telephone Encounter (Signed)
Requested Prescriptions  Pending Prescriptions Disp Refills   ALLERGY RELIEF 180 MG tablet [Pharmacy Med Name: FEXOFENADINE '180MG'$  TABLETS (OTC)] 90 tablet 1    Sig: TAKE 1 TABLET BY MOUTH EVERY DAY     Ear, Nose, and Throat:  Antihistamines Passed - 10/16/2022  6:38 AM      Passed - Valid encounter within last 12 months    Recent Outpatient Visits           3 months ago Ventricular tachycardia ([place])   [place], Henrine Screws T, NP   5 months ago Ventricular tachycardia ([place])   [place], Henrine Screws T, NP   6 months ago Ventricular tachycardia ([place])   [place], Henrine Screws T, NP   9 months ago Thrombocytopenia ([place])   Page, Montague T, NP   10 months ago Acute non-recurrent frontal sinusitis   [facility] Jon Billings, NP       Future Appointments             In 2 months Cannady, Barbaraann Faster, NP [employer]

## 2022-10-28 ENCOUNTER — Ambulatory Visit (INDEPENDENT_AMBULATORY_CARE_PROVIDER_SITE_OTHER): Payer: Medicare PPO | Admitting: *Deleted

## 2022-10-28 DIAGNOSIS — Z0000 Encounter for general adult medical examination without abnormal findings: Secondary | ICD-10-CM | POA: Diagnosis not present

## 2022-10-28 NOTE — Progress Notes (Signed)
Subjective:   Shawn Kitchens Sr. is a 76 y.o. male who presents for Medicare Annual/Subsequent preventive examination.  I connected with  Shawn Kitchens Sr. on 10/28/22 by a telephone enabled telemedicine application and verified that I am speaking with the correct person using two identifiers.   I discussed the limitations of evaluation and management by telemedicine. The patient expressed understanding and agreed to proceed.  Patient location: home  Provider location: Tele-health-home   Review of Systems     Cardiac Risk Factors include: advanced age (>17mn, >>14women);hypertension;male gender;obesity (BMI >30kg/m2);family history of premature cardiovascular disease     Objective:    Today's Vitals   There is no height or weight on file to calculate BMI.     10/28/2022    1:09 PM 09/20/2022    9:58 AM 04/01/2022    9:39 AM 03/21/2022   10:01 AM 03/20/2022    5:24 PM 10/16/2021   10:55 AM 05/04/2021    1:18 PM  Advanced Directives  Does Patient Have a Medical Advance Directive? Yes Yes No Yes Yes Yes Yes  Type of AAcademic librarianLiving will;Healthcare Power of Attorney  Living will;Healthcare Power of ASan LeannaLiving will Healthcare Power of AWoodlandLiving will  Does patient want to make changes to medical advance directive?  Yes (ED - Information included in AVS)     No - Patient declined  Copy of [place] Chart? Yes - validated most recent copy scanned in chart (See row information)   No - copy requested  Yes - validated most recent copy scanned in chart (See row information) No - copy requested  Would patient like information on creating a medical advance directive?  Yes (ED - Information included in AVS) No - Patient declined        Current Medications (verified) Outpatient Encounter Medications as of 10/28/2022  Medication Sig   acetaminophen (TYLENOL)  500 MG tablet Take 1,000 mg by mouth every 6 (six) hours as needed for moderate pain or headache.   ALLERGY RELIEF 180 MG tablet TAKE 1 TABLET BY MOUTH EVERY DAY   aspirin EC 81 MG tablet Take 81 mg by mouth daily.   atorvastatin (LIPITOR) 80 MG tablet Take 1 tablet (80 mg total) by mouth at bedtime.   Cholecalciferol (VITAMIN D3) 50 MCG (2000 UT) CHEW Chew 1 tablet by mouth daily.   flecainide (TAMBOCOR) 100 MG tablet Take 2 tablets (200 mg total) as needed by mouth. As needed for recurrent tachycardia.  Do not repeat more than once per day or 3x per wk   furosemide (LASIX) 20 MG tablet Take 20 mg by mouth.   metoprolol succinate (TOPROL-XL) 50 MG 24 hr tablet Take 1 tablet by mouth daily.   montelukast (SINGULAIR) 10 MG tablet Take 1 tablet (10 mg total) by mouth at bedtime.   niacin (NIASPAN) 1000 MG CR tablet TAKE 1 TABLET(1000 MG) BY MOUTH AT BEDTIME   nitroGLYCERIN (NITROSTAT) 0.4 MG SL tablet    pantoprazole (PROTONIX) 40 MG tablet TAKE 1 TABLET(40 MG) BY MOUTH DAILY   ramipril (ALTACE) 10 MG capsule Take 10 mg by mouth daily.   vitamin B-12 (CYANOCOBALAMIN) 1000 MCG tablet Take 1 tablet (1,000 mcg total) by mouth daily.   fluticasone (FLOVENT HFA) 220 MCG/ACT inhaler Inhale 2 puffs into the lungs in the morning and at bedtime. Inhale 2 puffs into the lungs and swallow for 8 weeks.  Then rinse your mouth.   No facility-administered encounter medications on file as of 10/28/2022.    Allergies (verified) Eggs or egg-derived products and Peanut (diagnostic)   History: Past Medical History:  Diagnosis Date   Arthritis    Basal cell carcinoma 1999   basal cell/skin   CAD (coronary artery disease)    Cataract Cataract surgery 2017   GERD (gastroesophageal reflux disease)    History of bone marrow biopsy    History of kidney stones 2013   passed stone on his own   Hyperlipidemia    Hypertension    Leukopenia    Shingles    Ventricular tachycardia [facility])    Past Surgical History:   Procedure Laterality Date   CARDIAC CATHETERIZATION N/A 01/16/2016   Procedure: Left Heart Cath and Coronary Angiography;  Surgeon: [name], MD;  Location: [facility];  Service: Cardiovascular;  Laterality: N/A;   CATARACT EXTRACTION Left 2017   COLONOSCOPY WITH PROPOFOL N/A 03/27/2020   Procedure: COLONOSCOPY WITH PROPOFOL;  Surgeon: [name], MD;  Location: [facility];  Service: Endoscopy;  Laterality: N/A;  priority 4   CORONARY ANGIOPLASTY  2000 and 2003   ESOPHAGOGASTRODUODENOSCOPY (EGD) WITH PROPOFOL N/A 04/01/2022   Procedure: ESOPHAGOGASTRODUODENOSCOPY (EGD) WITH PROPOFOL;  Surgeon: Jonathon Bellows, MD;  Location: [facility] END[facility]COPY;  Service: Gastroenterology;  Laterality: N/A;   EYE SURGERY  2017   Cateract   EYE SURGERY Left 04/09/2022   GANGLION CYST EXCISION  1979   HAND SURGERY Right 02/08/2021   blood clot removal per patient   KNEE ARTHROSCOPY WITH MEDIAL MENISECTOMY Left 12/01/2017   Procedure: KNEE ARTHROSCOPY WITH MEDIAL MENISECTOMY;  Surgeon: [name], MD;  Location: [facility];  Service: Orthopedics;  Laterality: Left;  Partial   LEFT HEART CATH Right 08/07/2017   Procedure: Left Heart Cath;  Surgeon: [name], MD;  Location: [place] [facility];  Service: Cardiovascular;  Laterality: Right;   PILONIDAL CYST EXCISION  1977   POLYPECTOMY  03/27/2020   Procedure: POLYPECTOMY;  Surgeon: [name], MD;  Location: [place];  Service: Endoscopy;;   SKIN CANCER EXCISION  1999   ear   SYNOVECTOMY Left 12/01/2017   Procedure: SYNOVECTOMY;  Surgeon: [name], MD;  Location: [facility];  Service: Orthopedics;  Laterality: Left;  Partial   V TACH ABLATION N/A 08/14/2017   Procedure: V Tach Ablation;  Surgeon: [name], MD;  Location: [place] [facility];  Service: Cardiovascular;  Laterality: N/A;   VEIN SURGERY  2000   laser   Family History  Problem Relation Age of Onset   Hypertension Mother    Heart  disease Mother    Heart attack Mother    Diabetes Mother    Alzheimer's disease Father    Alzheimer's disease Brother    Congestive Heart Failure Brother    Hypertension Brother    Other Brother        dementia   Heart disease Brother    Brain cancer Daughter 47   Cancer Daughter    Heart disease Son    Cancer Maternal Uncle    Cancer Maternal Grandfather    Social History   Socioeconomic History   Marital status: Married    Spouse name: Not on file   Number of children: 2   Years of education: Not on file   Highest education level: Master's degree (e.g., MA, MS, MEng, MEd, MSW, MBA)  Occupational History   Occupation: retired  Tobacco Use   Smoking status: Former    Packs/day: 1.00    Years: 5.00    Total pack years: 5.00    Types: Cigarettes    Quit date: 06/21/1971    Years since quitting: 51.3   Smokeless tobacco: Former    Types: Chew    Quit date: 06/20/1980  Vaping Use   Vaping Use: Never used  Substance and Sexual Activity   Alcohol use: Not Currently    Alcohol/week: 0.0 standard drinks of alcohol   Drug use: No   Sexual activity: Not Currently  Other Topics Concern   Not on file  Social History Narrative   Lives in [place]   Retired school principal   Social Determinants of Health   Financial Resource Strain: [place]  (10/28/2022)   Overall Financial Resource Strain (CARDIA)    Difficulty of Paying Living Expenses: Not hard at all  Food Insecurity: No Food Insecurity (10/28/2022)   Hunger Vital Sign    Worried About Running Out of Food in the Last Year: Never true    Bodfish in the Last Year: Never true  Transportation Needs: No Transportation Needs (10/28/2022)   PRAPARE - Hydrologist (Medical): No    Lack of Transportation (Non-Medical): No  Physical Activity: Insufficiently Active (10/28/2022)   Exercise Vital Sign    Days of Exercise per Week: 3 days    Minutes of Exercise per Session: 30 min   Stress: No Stress Concern Present (10/28/2022)   [place]    Feeling of Stress : Not at all  Social Connections: [place] (10/28/2022)   Social Connection and Isolation Panel [NHANES]    Frequency of Communication with Friends and Family: More than three times a week    Frequency of Social Gatherings with Friends and Family: Once a week    Attends Religious Services: More than 4 times per year    Active Member of [employer] or Organizations: Yes    Attends Music therapist: More than 4 times per year    Marital Status: Married    Tobacco Counseling Counseling given: Not Answered   Clinical Intake:              How often do you need to have someone help you when you read instructions, pamphlets, or other written materials from your doctor or pharmacy?: (P) 1 - Never  Diabetic?  no         Activities of Daily Living    10/28/2022    1:15 PM 10/24/2022   11:25 AM  In your present state of health, do you have any difficulty performing the following activities:  Hearing? 0 0  Vision? 0 0  Difficulty concentrating or making decisions? 0 0  Walking or climbing stairs? 0 0  Dressing or bathing? 0 0  Doing errands, shopping? 0 0  Preparing Food and eating ? N N  Using the Toilet? N N  In the past six months, have you accidently leaked urine? N N  Do you have problems with loss of bowel control? N N  Managing your Medications? N N  Managing your Finances? N N  Housekeeping or managing your Housekeeping? N N    Patient Care Team: Venita Lick, NP as PCP - General (Nurse Practitioner) [name], MD as Consulting Physician (Cardiology) [name], MD as Consulting Physician (Oncology)  Indicate any recent Medical Services  you may have received from other than [facility] providers in the past year (date may be approximate).     Assessment:   This is a routine wellness  examination for [place].  Hearing/Vision screen Hearing Screening - Comments:: No trouble hearing Vision Screening - Comments:: Up to date [place]  Dietary issues and exercise activities discussed: Current Exercise Habits: Home exercise routine, Type of exercise: walking, Time (Minutes): 30, Intensity: Mild   Goals Addressed             This Visit's Progress    Increase physical activity         Depression Screen    10/28/2022    1:15 PM 07/02/2022    8:24 AM 05/02/2022    9:54 AM 03/25/2022    3:14 PM 01/01/2022    3:37 PM 10/16/2021   10:54 AM 11/28/2020    2:55 PM  PHQ 2/9 Scores  PHQ - 2 Score 0 0 0 0 0 0 0  PHQ- 9 Score 0 0 0 0 0      Fall Risk    10/28/2022    1:08 PM 10/24/2022   11:25 AM 07/02/2022    8:24 AM 05/02/2022    9:53 AM 03/25/2022    3:14 PM  Fall Risk   Falls in the past year? 0 0 0 0 0  Number falls in past yr: 0  0 0 0  Injury with Fall? 0  0 0 0  Risk for fall due to :   No Fall Risks No Fall Risks No Fall Risks  Follow up Falls evaluation completed;Education provided;Falls prevention discussed  Falls evaluation completed Falls evaluation completed Falls evaluation completed    FALL RISK PREVENTION PERTAINING TO THE HOME:  Any stairs in or around the home? No  If so, are there any without handrails? No  Home free of loose throw rugs in walkways, pet beds, electrical cords, etc? Yes  Adequate lighting in your home to reduce risk of falls? Yes   ASSISTIVE DEVICES UTILIZED TO PREVENT FALLS:  Life alert? No  Use of a cane, walker or w/c? No  Grab bars in the bathroom? Yes  Shower chair or bench in shower? No  Elevated toilet seat or a handicapped toilet? No   TIMED UP AND GO:  Was the test performed? No .    Cognitive Function:        10/28/2022    1:11 PM 10/09/2020   10:35 AM 10/01/2018    1:44 PM 09/11/2017    8:56 AM  6CIT Screen  What Year? 0 points 0 points 0 points 0 points  What month? 0 points 0 points 0 points 0 points   What time? 0 points 0 points 0 points 0 points  Count back from 20 0 points 0 points 0 points 0 points  Months in reverse 0 points 0 points 0 points 0 points  Repeat phrase 0 points 0 points 0 points 0 points  Total Score 0 points 0 points 0 points 0 points    Immunizations Immunization History  Administered Date(s) Administered   Fluad Quad(high Dose 65+) 09/12/2021, 10/09/2022   Influenza, High Dose Seasonal PF 08/26/2017, 08/05/2019   Influenza-Unspecified 09/27/2015, 08/21/2016, 08/26/2017, 09/04/2018, 08/16/2020   Moderna Sars-Covid-2 Vaccination 01/07/2020, 02/05/2020, 08/07/2020, 01/30/2021   Pneumococcal Conjugate-13 12/08/2014   Pneumococcal Polysaccharide-23 09/10/2016   Td 12/21/2007, 12/16/2019   Zoster, Live 12/22/2008    TDAP status: Up to date  Flu Vaccine status: Up to date  Pneumococcal vaccine status: Up to date  Covid-19 vaccine status: Information provided on how to obtain vaccines.   Qualifies for Shingles Vaccine? Yes   Zostavax completed Yes   Shingrix Completed?: No.    Education has been provided regarding the importance of this vaccine. Patient has been advised to call insurance company to determine out of pocket expense if they have not yet received this vaccine. Advised may also receive vaccine at local pharmacy or Health Dept. Verbalized acceptance and understanding.  Screening Tests Health Maintenance  Topic Date Due   Zoster Vaccines- Shingrix (1 of 2) Never done   COVID-19 Vaccine (5 - 2023-24 season) 08/02/2022   Medicare Annual Wellness (AWV)  10/29/2023   DEXA SCAN  08/07/2027   COLONOSCOPY (Pts 45-64yr Insurance coverage will need to be confirmed)  03/27/2030   Pneumonia Vaccine 75 Years old  Completed   INFLUENZA VACCINE  Completed   Hepatitis C Screening  Completed   HPV VACCINES  Aged Out    Health Maintenance  Health Maintenance Due  Topic Date Due   Zoster Vaccines- Shingrix (1 of 2) Never done   COVID-19 Vaccine (5 -  2023-24 season) 08/02/2022    Colonoscopy  2021  Lung Cancer Screening: (Low Dose CT Chest recommended if Age 75-80years, 30 pack-year currently smoking OR have quit w/in 15years.) does not qualify.   Lung Cancer Screening Referral:   Additional Screening:  Hepatitis C Screening: does not qualify; Completed 2020  Vision Screening: Recommended annual ophthalmology exams for early detection of glaucoma and other disorders of the eye. Is the patient up to date with their annual eye exam?  Yes  Who is the provider or what is the name of the office in which the patient attends annual eye exams? [facility] If pt is not established with a provider, would they like to be referred to a provider to establish care? No .   Dental Screening: Recommended annual dental exams for proper oral hygiene  Community Resource Referral / Chronic Care Management: CRR required this visit?  No   CCM required this visit?  No      Plan:     I have personally reviewed and noted the following in the patient's chart:   Medical and social history Use of alcohol, tobacco or illicit drugs  Current medications and supplements including opioid prescriptions. Patient is not currently taking opioid prescriptions. Functional ability and status Nutritional status Physical activity Advanced directives List of other physicians Hospitalizations, surgeries, and ER visits in previous 12 months Vitals Screenings to include cognitive, depression, and falls Referrals and appointments  In addition, I have reviewed and discussed with patient certain preventive protocols, quality metrics, and best practice recommendations. A written personalized care plan for preventive services as well as general preventive health recommendations were provided to patient.     JLeroy Kennedy LPN   [id number]  Nurse Notes:

## 2022-10-28 NOTE — Patient Instructions (Signed)
Shawn Shepard , Thank you for taking time to come for your Medicare Wellness Visit. I appreciate your ongoing commitment to your health goals. Please review the following plan we discussed and let me know if I can assist you in the future.   These are the goals we discussed:  Goals      DIET - INCREASE WATER INTAKE     Recommend drinking 6-8 glasses of water per day     Increase physical activity     Patient Stated     10/09/2020, wants to lose 10 pounds     Patient Stated     Loose weight         This is a list of the screening recommended for you and due dates:  Health Maintenance  Topic Date Due   Zoster (Shingles) Vaccine (1 of 2) Never done   COVID-19 Vaccine (5 - 2023-24 season) 08/02/2022   Medicare Annual Wellness Visit  10/29/2023   DEXA scan (bone density measurement)  08/07/2027   Colon Cancer Screening  03/27/2030   Pneumonia Vaccine  Completed   Flu Shot  Completed   Hepatitis C Screening: USPSTF Recommendation to screen - Ages 18-79 yo.  Completed   HPV Vaccine  Aged Out    Advanced directives: on file  Conditions/risks identified:     Preventive Care 72 Years and Older, Male Preventive care refers to lifestyle choices and visits with your health care provider that can promote health and wellness. What does preventive care include? A yearly physical exam. This is also called an annual well check. Dental exams once or twice a year. Routine eye exams. Ask your health care provider how often you should have your eyes checked. Personal lifestyle choices, including: Daily care of your teeth and gums. Regular physical activity. Eating a healthy diet. Avoiding tobacco and drug use. Limiting alcohol use. Practicing safe sex. Taking low-dose aspirin every day. Taking vitamin and mineral supplements as recommended by your health care provider. What happens during an annual well check? The services and screenings done by your health care provider during your  annual well check will depend on your age, overall health, lifestyle risk factors, and family history of disease. Counseling  Your health care provider may ask you questions about your: Alcohol use. Tobacco use. Drug use. Emotional well-being. Home and relationship well-being. Sexual activity. Eating habits. History of falls. Memory and ability to understand (cognition). Work and work Statistician. Reproductive health. Screening  You may have the following tests or measurements: Height, weight, and BMI. Blood pressure. Lipid and cholesterol levels. These may be checked every 5 years, or more frequently if you are over 85 years old. Skin check. Lung cancer screening. You may have this screening every year starting at age 15 if you have a 30-pack-year history of smoking and currently smoke or have quit within the past 15 years. Fecal occult blood test (FOBT) of the stool. You may have this test every year starting at age 84. Flexible sigmoidoscopy or colonoscopy. You may have a sigmoidoscopy every 5 years or a colonoscopy every 10 years starting at age 69. Hepatitis C blood test. Hepatitis B blood test. Sexually transmitted disease (STD) testing. Diabetes screening. This is done by checking your blood sugar (glucose) after you have not eaten for a while (fasting). You may have this done every 1-3 years. Bone density scan. This is done to screen for osteoporosis. You may have this done starting at age 54. Mammogram. This may be done  every 1-2 years. Talk to your health care provider about how often you should have regular mammograms. Talk with your health care provider about your test results, treatment options, and if necessary, the need for more tests. Vaccines  Your health care provider may recommend certain vaccines, such as: Influenza vaccine. This is recommended every year. Tetanus, diphtheria, and acellular pertussis (Tdap, Td) vaccine. You may need a Td booster every 10  years. Zoster vaccine. You may need this after age 44. Pneumococcal 13-valent conjugate (PCV13) vaccine. One dose is recommended after age 28. Pneumococcal polysaccharide (PPSV23) vaccine. One dose is recommended after age 72. Talk to your health care provider about which screenings and vaccines you need and how often you need them. This information is not intended to replace advice given to you by your health care provider. Make sure you discuss any questions you have with your health care provider. Document Released: 12/15/2015 Document Revised: 08/07/2016 Document Reviewed: 09/19/2015 Elsevier Interactive Patient Education  2017 [place] Prevention in the Home Falls can cause injuries. They can happen to people of all ages. There are many things you can do to make your home safe and to help prevent falls. What can I do on the outside of my home? Regularly fix the edges of walkways and driveways and fix any cracks. Remove anything that might make you trip as you walk through a door, such as a raised step or threshold. Trim any bushes or trees on the path to your home. Use bright outdoor lighting. Clear any walking paths of anything that might make someone trip, such as rocks or tools. Regularly check to see if handrails are loose or broken. Make sure that both sides of any steps have handrails. Any raised decks and porches should have guardrails on the edges. Have any leaves, snow, or ice cleared regularly. Use sand or salt on walking paths during winter. Clean up any spills in your garage right away. This includes oil or grease spills. What can I do in the bathroom? Use night lights. Install grab bars by the toilet and in the tub and shower. Do not use towel bars as grab bars. Use non-skid mats or decals in the tub or shower. If you need to sit down in the shower, use a plastic, non-slip stool. Keep the floor dry. Clean up any water that spills on the floor as soon as it  happens. Remove soap buildup in the tub or shower regularly. Attach bath mats securely with double-sided non-slip rug tape. Do not have throw rugs and other things on the floor that can make you trip. What can I do in the bedroom? Use night lights. Make sure that you have a light by your bed that is easy to reach. Do not use any sheets or blankets that are too big for your bed. They should not hang down onto the floor. Have a firm chair that has side arms. You can use this for support while you get dressed. Do not have throw rugs and other things on the floor that can make you trip. What can I do in the kitchen? Clean up any spills right away. Avoid walking on wet floors. Keep items that you use a lot in easy-to-reach places. If you need to reach something above you, use a strong step stool that has a grab bar. Keep electrical cords out of the way. Do not use floor polish or wax that makes floors slippery. If you must use wax, use  non-skid floor wax. Do not have throw rugs and other things on the floor that can make you trip. What can I do with my stairs? Do not leave any items on the stairs. Make sure that there are handrails on both sides of the stairs and use them. Fix handrails that are broken or loose. Make sure that handrails are as long as the stairways. Check any carpeting to make sure that it is firmly attached to the stairs. Fix any carpet that is loose or worn. Avoid having throw rugs at the top or bottom of the stairs. If you do have throw rugs, attach them to the floor with carpet tape. Make sure that you have a light switch at the top of the stairs and the bottom of the stairs. If you do not have them, ask someone to add them for you. What else can I do to help prevent falls? Wear shoes that: Do not have high heels. Have rubber bottoms. Are comfortable and fit you well. Are closed at the toe. Do not wear sandals. If you use a stepladder: Make sure that it is fully opened.  Do not climb a closed stepladder. Make sure that both sides of the stepladder are locked into place. Ask someone to hold it for you, if possible. Clearly mark and make sure that you can see: Any grab bars or handrails. First and last steps. Where the edge of each step is. Use tools that help you move around (mobility aids) if they are needed. These include: Canes. Walkers. Scooters. Crutches. Turn on the lights when you go into a dark area. Replace any light bulbs as soon as they burn out. Set up your furniture so you have a clear path. Avoid moving your furniture around. If any of your floors are uneven, fix them. If there are any pets around you, be aware of where they are. Review your medicines with your doctor. Some medicines can make you feel dizzy. This can increase your chance of falling. Ask your doctor what other things that you can do to help prevent falls. This information is not intended to replace advice given to you by your health care provider. Make sure you discuss any questions you have with your health care provider. Document Released: 09/14/2009 Document Revised: 04/25/2016 Document Reviewed: 12/23/2014 Elsevier Interactive Patient Education  2017 [employer].

## 2022-12-03 DIAGNOSIS — E782 Mixed hyperlipidemia: Secondary | ICD-10-CM | POA: Diagnosis not present

## 2022-12-03 DIAGNOSIS — I2510 Atherosclerotic heart disease of native coronary artery without angina pectoris: Secondary | ICD-10-CM | POA: Diagnosis not present

## 2022-12-03 DIAGNOSIS — I739 Peripheral vascular disease, unspecified: Secondary | ICD-10-CM | POA: Diagnosis not present

## 2022-12-03 DIAGNOSIS — I351 Nonrheumatic aortic (valve) insufficiency: Secondary | ICD-10-CM | POA: Diagnosis not present

## 2022-12-03 DIAGNOSIS — I340 Nonrheumatic mitral (valve) insufficiency: Secondary | ICD-10-CM | POA: Diagnosis not present

## 2022-12-03 DIAGNOSIS — I10 Essential (primary) hypertension: Secondary | ICD-10-CM | POA: Diagnosis not present

## 2022-12-03 DIAGNOSIS — R609 Edema, unspecified: Secondary | ICD-10-CM | POA: Diagnosis not present

## 2022-12-10 DIAGNOSIS — H02834 Dermatochalasis of left upper eyelid: Secondary | ICD-10-CM | POA: Diagnosis not present

## 2022-12-10 DIAGNOSIS — Z961 Presence of intraocular lens: Secondary | ICD-10-CM | POA: Diagnosis not present

## 2022-12-10 DIAGNOSIS — G43109 Migraine with aura, not intractable, without status migrainosus: Secondary | ICD-10-CM | POA: Diagnosis not present

## 2022-12-10 DIAGNOSIS — H02831 Dermatochalasis of right upper eyelid: Secondary | ICD-10-CM | POA: Diagnosis not present

## 2022-12-10 DIAGNOSIS — H35373 Puckering of macula, bilateral: Secondary | ICD-10-CM | POA: Diagnosis not present

## 2023-01-06 ENCOUNTER — Encounter: Payer: Medicare PPO | Admitting: Nurse Practitioner

## 2023-01-15 ENCOUNTER — Encounter: Payer: Medicare PPO | Admitting: Nurse Practitioner

## 2023-01-25 NOTE — Patient Instructions (Incomplete)

## 2023-01-28 ENCOUNTER — Ambulatory Visit (INDEPENDENT_AMBULATORY_CARE_PROVIDER_SITE_OTHER): Payer: Medicare PPO | Admitting: Nurse Practitioner

## 2023-01-28 ENCOUNTER — Encounter: Payer: Self-pay | Admitting: Nurse Practitioner

## 2023-01-28 VITALS — BP 108/58 | HR 58 | Temp 97.6°F | Ht 70.98 in | Wt 199.9 lb

## 2023-01-28 DIAGNOSIS — I2510 Atherosclerotic heart disease of native coronary artery without angina pectoris: Secondary | ICD-10-CM

## 2023-01-28 DIAGNOSIS — Z0000 Encounter for general adult medical examination without abnormal findings: Secondary | ICD-10-CM | POA: Diagnosis not present

## 2023-01-28 DIAGNOSIS — D72820 Lymphocytosis (symptomatic): Secondary | ICD-10-CM

## 2023-01-28 DIAGNOSIS — E7800 Pure hypercholesterolemia, unspecified: Secondary | ICD-10-CM

## 2023-01-28 DIAGNOSIS — D708 Other neutropenia: Secondary | ICD-10-CM

## 2023-01-28 DIAGNOSIS — M810 Age-related osteoporosis without current pathological fracture: Secondary | ICD-10-CM

## 2023-01-28 DIAGNOSIS — I10 Essential (primary) hypertension: Secondary | ICD-10-CM

## 2023-01-28 DIAGNOSIS — I472 Ventricular tachycardia, unspecified: Secondary | ICD-10-CM

## 2023-01-28 DIAGNOSIS — D696 Thrombocytopenia, unspecified: Secondary | ICD-10-CM | POA: Diagnosis not present

## 2023-01-28 DIAGNOSIS — I2583 Coronary atherosclerosis due to lipid rich plaque: Secondary | ICD-10-CM

## 2023-01-28 DIAGNOSIS — E538 Deficiency of other specified B group vitamins: Secondary | ICD-10-CM

## 2023-01-28 DIAGNOSIS — R001 Bradycardia, unspecified: Secondary | ICD-10-CM

## 2023-01-28 DIAGNOSIS — N400 Enlarged prostate without lower urinary tract symptoms: Secondary | ICD-10-CM

## 2023-01-28 DIAGNOSIS — K200 Eosinophilic esophagitis: Secondary | ICD-10-CM

## 2023-01-28 LAB — MICROALBUMIN, URINE WAIVED
Creatinine, Urine Waived: 50 mg/dL (ref 10–300)
Microalb, Ur Waived: 10 mg/L (ref 0–19)

## 2023-01-28 NOTE — Assessment & Plan Note (Addendum)
Check PSA today. No current symptoms.

## 2023-01-28 NOTE — Assessment & Plan Note (Signed)
Follow by hematology, continue this collaboration.  Recent note reviewed.

## 2023-01-28 NOTE — Assessment & Plan Note (Signed)
Chronic, ongoing. Ablation in 2018. Continue collaboration with cardiology, no recent use of Tambocor.

## 2023-01-28 NOTE — Assessment & Plan Note (Signed)
Chronic, ongoing.  Continue collaboration with hematology, recent note and labs reviewed.

## 2023-01-28 NOTE — Progress Notes (Signed)
BP (!) 108/58   Pulse (!) 58   Temp 97.6 F (36.4 C) (Oral)   Ht 5' 10.98" (1.803 m)   Wt 199 lb 14.4 oz (90.7 kg)   SpO2 98%   BMI 27.89 kg/m    Subjective:    Patient ID: Shawn Kitchens Sr., male    DOB: 08/01/1946, 77 y.o.   MRN: [id number]  NOTE WRITTEN BY [employer] DNP STUDENT.  ASSESSMENT AND PLAN OF CARE REVIEWED WITH STUDENT, AGREE WITH ABOVE FINDINGS AND PLAN.   HPI: Shawn PETSCH Sr. is a 77 y.o. male presenting on 01/28/2023 for comprehensive medical examination. Current medical complaints include: pt reports he feels like he has had some low blood sugars based on feeling light headed, shaky, having a "pressure-like" sensation behind his eyes, and his blood pressure also tends to be low during these events.  Pt reports eating something and sitting down and this provides him relief. Pt states that there is no particular time of day that these events occur but does he [place]. Pt also reports having a recent eye exam that was negative for new or worsening issues.   Pt also reports feeling worried/depressed about his wife's reoccurring bladder cancer as well as not seeing his children and grandchildren as much as he would like. Pt reports talking about his feelings helps and he declines medication for mood symptom management at this time.  He currently lives with: wife Interim Problems from his last visit: yes  HYPERTENSION / HYPERLIPIDEMIA Continues on Ramipril, Metoprolol, Tambocor as needed, Niacin, and Atorvastatin.  Sees cardiology, Dr. Humphrey Rolls, every 4 months.  Had ablation >5 years ago for VT. Has not had to take any Tambocor.     Continues to follow with hematology for leukopenia and thrombocytopenia, last visit 03/21/22.   Satisfied with current treatment? yes Duration of hypertension: years BP monitoring frequency: a few times a week BP range:  BP medication side effects: no Duration of hyperlipidemia: years Cholesterol  medication side effects: no Cholesterol supplements: niacin Medication compliance: good compliance Aspirin: yes Recent stressors: yes Recurrent headaches: yes Visual changes: no Palpitations: no Dyspnea: yes Chest pain: no Lower extremity edema: no Dizzy/lightheaded: yes    GERD Saw GI last on 06/24/22, to return as needed.  To continue to follow with ENT.  Diagnosed with eosinophilic esophagitis = taking Flonase, Singulair, Allegra, Protonix. GERD control status: controlled Satisfied with current treatment? yes Heartburn frequency:  Medication side effects: no  Medication compliance: stable Dysphagia: yes Odynophagia:  no Hematemesis: no Blood in stool: no EGD: yes    OSTEOPENIA Last DEXA in 2023 noting T-score -2.6.  Takes daily supplements. Satisfied with current treatment?: yes Adequate calcium & vitamin D: yes Weight bearing exercises: yes   Functional Status Survey: Is the patient deaf or have difficulty hearing?: No Does the patient have difficulty seeing, even when wearing glasses/contacts?: No Does the patient have difficulty concentrating, remembering, or making decisions?: No Does the patient have difficulty walking or climbing stairs?: No Does the patient have difficulty dressing or bathing?: No Does the patient have difficulty doing errands alone such as visiting a doctor's office or shopping?: No  FALL RISK:    01/28/2023    1:42 PM 10/28/2022    1:08 PM 10/24/2022   11:25 AM 07/02/2022    8:24 AM 05/02/2022    9:53 AM  [place] in the past year? 0 0 0 0 0  Number falls in past yr: 0 0  0 0  Injury with Fall? 0 0  0 0  Risk for fall due to : No Fall Risks   No Fall Risks No Fall Risks  Follow up Falls evaluation completed Falls evaluation completed;Education provided;Falls prevention discussed  Falls evaluation completed Falls evaluation completed    Depression Screen    01/28/2023    1:42 PM 10/28/2022    1:15 PM 07/02/2022    8:24 AM  05/02/2022    9:54 AM 03/25/2022    3:14 PM  Depression screen PHQ 2/9  Decreased Interest 0 0 0 0 0  Down, Depressed, Hopeless 0 0 0 0 0  PHQ - 2 Score 0 0 0 0 0  Altered sleeping 1 0 0 0 0  Tired, decreased energy 0 0 0 0 0  Change in appetite 0 0 0 0 0  Feeling bad or failure about yourself  0 0 0 0 0  Trouble concentrating 0 0 0 0 0  Moving slowly or fidgety/restless 0 0 0 0 0  Suicidal thoughts 0 0 0 0 0  PHQ-9 Score 1 0 0 0 0  Difficult doing work/chores Not difficult at all  Not difficult at all Not difficult at all Not difficult at all    Advanced Directives Does patient have a HCPOA?    yes If yes, name and contact information:  Does patient have a living will or MOST form?  yes  Past Medical History:  Past Medical History:  Diagnosis Date   Arthritis    Basal cell carcinoma 1999   basal cell/skin   CAD (coronary artery disease)    Cataract Cataract surgery 2017   GERD (gastroesophageal reflux disease)    History of bone marrow biopsy    History of kidney stones 2013   passed stone on his own   Hyperlipidemia    Hypertension    Leukopenia    Shingles    Ventricular tachycardia [facility])     Surgical History:  Past Surgical History:  Procedure Laterality Date   CARDIAC CATHETERIZATION N/A 01/16/2016   Procedure: Left Heart Cath and Coronary Angiography;  Surgeon: [name], MD;  Location: [place] [facility];  Service: Cardiovascular;  Laterality: N/A;   CATARACT EXTRACTION Left 2017   COLONOSCOPY WITH PROPOFOL N/A 03/27/2020   Procedure: COLONOSCOPY WITH PROPOFOL;  Surgeon: [name], MD;  Location: [place];  Service: Endoscopy;  Laterality: N/A;  priority 4   CORONARY ANGIOPLASTY  2000 and 2003   ESOPHAGOGASTRODUODENOSCOPY (EGD) WITH PROPOFOL N/A 04/01/2022   Procedure: ESOPHAGOGASTRODUODENOSCOPY (EGD) WITH PROPOFOL;  Surgeon: Jonathon Bellows, MD;  Location: [facility];  Service: Gastroenterology;  Laterality: N/A;   EYE SURGERY  2017    Cateract   EYE SURGERY Left 04/09/2022   GANGLION CYST EXCISION  1979   HAND SURGERY Right 02/08/2021   blood clot removal per patient   KNEE ARTHROSCOPY WITH MEDIAL MENISECTOMY Left 12/01/2017   Procedure: KNEE ARTHROSCOPY WITH MEDIAL MENISECTOMY;  Surgeon: [name], MD;  Location: [facility];  Service: Orthopedics;  Laterality: Left;  Partial   LEFT HEART CATH Right 08/07/2017   Procedure: Left Heart Cath;  Surgeon: [name], MD;  Location: [place] [facility];  Service: Cardiovascular;  Laterality: Right;   PILONIDAL CYST EXCISION  1977   POLYPECTOMY  03/27/2020   Procedure: POLYPECTOMY;  Surgeon: [name], MD;  Location: [place];  Service: Endoscopy;;   SKIN CANCER EXCISION  1999   ear   SYNOVECTOMY Left 12/01/2017   Procedure: SYNOVECTOMY;  Surgeon: [name], MD;  Location: [facility];  Service: Orthopedics;  Laterality: Left;  Partial   V TACH ABLATION N/A 08/14/2017   Procedure: V Tach Ablation;  Surgeon: [name], MD;  Location: [place] [facility];  Service: Cardiovascular;  Laterality: N/A;   VEIN SURGERY  2000   laser    Medications:  Current Outpatient Medications on File Prior to Visit  Medication Sig   acetaminophen (TYLENOL) 500 MG tablet Take 1,000 mg by mouth every 6 (six) hours as needed for moderate pain or headache.   ALLERGY RELIEF 180 MG tablet TAKE 1 TABLET BY MOUTH EVERY DAY   aspirin EC 81 MG tablet Take 81 mg by mouth daily.   atorvastatin (LIPITOR) 80 MG tablet Take 1 tablet (80 mg total) by mouth at bedtime.   Cholecalciferol (VITAMIN D3) 50 MCG (2000 UT) CHEW Chew 1 tablet by mouth daily.   flecainide (TAMBOCOR) 100 MG tablet Take 2 tablets (200 mg total) as needed by mouth. As needed for recurrent tachycardia.  Do not repeat more than once per day or 3x per wk   furosemide (LASIX) 20 MG tablet Take 20 mg by mouth.   metoprolol succinate (TOPROL-XL) 50 MG 24 hr tablet Take 1 tablet by mouth daily.   montelukast  (SINGULAIR) 10 MG tablet Take 1 tablet (10 mg total) by mouth at bedtime.   niacin (NIASPAN) 1000 MG CR tablet TAKE 1 TABLET(1000 MG) BY MOUTH AT BEDTIME   nitroGLYCERIN (NITROSTAT) 0.4 MG SL tablet    pantoprazole (PROTONIX) 40 MG tablet TAKE 1 TABLET(40 MG) BY MOUTH DAILY   ramipril (ALTACE) 10 MG capsule Take 10 mg by mouth daily.   vitamin B-12 (CYANOCOBALAMIN) 1000 MCG tablet Take 1 tablet (1,000 mcg total) by mouth daily.   No current facility-administered medications on file prior to visit.    Allergies:  Allergies  Allergen Reactions   Eggs Or Egg-Derived Products    Peanut (Diagnostic)     Social History:  Social History   Socioeconomic History   Marital status: Married    Spouse name: Not on file   Number of children: 2   Years of education: Not on file   Highest education level: Master's degree (e.g., MA, MS, MEng, MEd, MSW, MBA)  Occupational History   Occupation: retired  Tobacco Use   Smoking status: Former    Packs/day: 1.00    Years: 5.00    Total pack years: 5.00    Types: Cigarettes    Quit date: 06/21/1971    Years since quitting: 51.6   Smokeless tobacco: Former    Types: Chew    Quit date: 06/20/1980  Vaping Use   Vaping Use: Never used  Substance and Sexual Activity   Alcohol use: Not Currently    Alcohol/week: 0.0 standard drinks of alcohol   Drug use: No   Sexual activity: Not Currently  Other Topics Concern   Not on file  Social History Narrative   Lives in [place]   Retired school principal   Social Determinants of Health   Financial Resource Strain: [place]  (10/28/2022)   Overall Financial Resource Strain (CARDIA)    Difficulty of Paying Living Expenses: Not hard at all  Food Insecurity: No Food Insecurity (10/28/2022)   Hunger Vital Sign    Worried About Running Out of Food in the Last Year: Never true    Ran Out  of Food in the Last Year: Never true  Transportation Needs: No Transportation Needs (10/28/2022)   PRAPARE -  Hydrologist (Medical): No    Lack of Transportation (Non-Medical): No  Physical Activity: Insufficiently Active (10/28/2022)   Exercise Vital Sign    Days of Exercise per Week: 3 days    Minutes of Exercise per Session: 30 min  Stress: No Stress Concern Present (10/28/2022)   [place]    Feeling of Stress : Not at all  Social Connections: [place] (10/28/2022)   Social Connection and Isolation Panel [NHANES]    Frequency of Communication with Friends and Family: More than three times a week    Frequency of Social Gatherings with Friends and Family: Once a week    Attends Religious Services: More than 4 times per year    Active Member of [employer] or Organizations: Yes    Attends Music therapist: More than 4 times per year    Marital Status: Married  Human resources officer Violence: Not At Risk (10/28/2022)   Humiliation, Afraid, Rape, and Kick questionnaire    Fear of Current or Ex-Partner: No    Emotionally Abused: No    Physically Abused: No    Sexually Abused: No   Social History   Tobacco Use  Smoking Status Former   Packs/day: 1.00   Years: 5.00   Total pack years: 5.00   Types: Cigarettes   Quit date: 06/21/1971   Years since quitting: 51.6  Smokeless Tobacco Former   Types: Chew   Quit date: 06/20/1980   Social History   Substance and Sexual Activity  Alcohol Use Not Currently   Alcohol/week: 0.0 standard drinks of alcohol    Family History:  Family History  Problem Relation Age of Onset   Hypertension Mother    Heart disease Mother    Heart attack Mother    Diabetes Mother    Alzheimer's disease Father    Alzheimer's disease Brother    Congestive Heart Failure Brother    Hypertension Brother    Other Brother        dementia   Heart disease Brother    Brain cancer Daughter 61   Cancer Daughter    Heart disease Son    Cancer Maternal Uncle     Cancer Maternal Grandfather     Past medical history, surgical history, medications, allergies, family history and social history reviewed with patient today and changes made to appropriate areas of the chart.   Review of Systems  Constitutional:  Positive for malaise/fatigue. Negative for chills, fever and weight loss.  HENT:  Negative for congestion, ear discharge, ear pain and sinus pain.   Eyes:  Negative for blurred vision, pain and discharge.  Respiratory:  Negative for cough and wheezing.   Cardiovascular:  Negative for chest pain, palpitations, orthopnea and leg swelling.  Gastrointestinal:  Negative for constipation, diarrhea, heartburn, nausea and vomiting.  Musculoskeletal: Negative.   Neurological:  Positive for dizziness and headaches. Negative for tremors, sensory change, speech change, focal weakness, loss of consciousness and weakness.       Describes as "pressure-like" behind his eyes  Psychiatric/Behavioral:  Negative for depression. The patient is not nervous/anxious.    All other ROS negative except what is listed above and in the HPI.      Objective:    BP (!) 108/58   Pulse (!) 58   Temp 97.6 F (36.4  C) (Oral)   Ht 5' 10.98" (1.803 m)   Wt 199 lb 14.4 oz (90.7 kg)   SpO2 98%   BMI 27.89 kg/m   Wt Readings from Last 3 Encounters:  01/28/23 199 lb 14.4 oz (90.7 kg)  09/20/22 196 lb 12.8 oz (89.3 kg)  07/02/22 200 lb 3.2 oz (90.8 kg)    No results found.  Physical Exam Vitals and nursing note reviewed.  Constitutional:      General: He is awake. He is not in acute distress.    Appearance: He is well-developed and well-groomed. He is not ill-appearing or toxic-appearing.  HENT:     Head: Normocephalic and atraumatic.     Right Ear: Hearing, tympanic membrane, ear canal and external ear normal. No drainage.     Left Ear: Hearing, tympanic membrane, ear canal and external ear normal. No drainage.     Nose: Nose normal.     Mouth/Throat:      Pharynx: Uvula midline.  Eyes:     General: Lids are normal.        Right eye: No discharge.        Left eye: No discharge.     Extraocular Movements: Extraocular movements intact.     Conjunctiva/sclera: Conjunctivae normal.     Pupils: Pupils are equal, round, and reactive to light.     Visual Fields: Right eye visual fields normal and left eye visual fields normal.  Neck:     Thyroid: No thyromegaly.     Vascular: No carotid bruit or JVD.     Trachea: Trachea normal.  Cardiovascular:     Rate and Rhythm: Regular rhythm. Bradycardia present.     Heart sounds: Normal heart sounds, S1 normal and S2 normal. No murmur heard.    No gallop.  Pulmonary:     Effort: Pulmonary effort is normal. No accessory muscle usage or respiratory distress.     Breath sounds: Normal breath sounds.  Abdominal:     General: Bowel sounds are normal.     Palpations: Abdomen is soft. There is no hepatomegaly or splenomegaly.     Tenderness: There is no abdominal tenderness.  Musculoskeletal:        General: Normal range of motion.     Cervical back: Normal range of motion and neck supple.     Right lower leg: No edema.     Left lower leg: No edema.  Lymphadenopathy:     Head:     Right side of head: No submental, submandibular, tonsillar, preauricular or posterior auricular adenopathy.     Left side of head: No submental, submandibular, tonsillar, preauricular or posterior auricular adenopathy.     Cervical: No cervical adenopathy.  Skin:    General: Skin is warm and dry.     Capillary Refill: Capillary refill takes less than 2 seconds.     Findings: No rash.  Neurological:     Mental Status: He is alert and oriented to person, place, and time.     Gait: Gait is intact.     Deep Tendon Reflexes: Reflexes are normal and symmetric.     Reflex Scores:      Brachioradialis reflexes are 2+ on the right side and 2+ on the left side.      Patellar reflexes are 2+ on the right side and 2+ on the left  side. Psychiatric:        Attention and Perception: Attention normal.        Mood and  Affect: Mood normal.        Speech: Speech normal.        Behavior: Behavior normal. Behavior is cooperative.        Thought Content: Thought content normal.        Cognition and Memory: Cognition normal.        10/28/2022    1:11 PM 10/09/2020   10:35 AM 10/01/2018    1:44 PM 09/11/2017    8:56 AM  6CIT Screen  What Year? 0 points 0 points 0 points 0 points  What month? 0 points 0 points 0 points 0 points  What time? 0 points 0 points 0 points 0 points  Count back from 20 0 points 0 points 0 points 0 points  Months in reverse 0 points 0 points 0 points 0 points  Repeat phrase 0 points 0 points 0 points 0 points  Total Score 0 points 0 points 0 points 0 points   Results for orders placed or performed in visit on 01/28/23  Microalbumin, Urine Waived  Result Value Ref Range   Microalb, Ur Waived 10 0 - 19 mg/L   Creatinine, Urine Waived 50 10 - 300 mg/dL   Microalb/Creat Ratio 30-300 (H) <30 mg/g      Assessment & Plan:   Problem List Items Addressed This Visit       Cardiovascular and Mediastinum   CAD (coronary artery disease)    Chronic, stable at this time with no recent CP. Continue current medication regimen and collaboration with cardiology. No recent NTG use.       Relevant Orders   Comprehensive metabolic panel   Lipid Panel w/o Chol/HDL Ratio   Essential hypertension    Chronic, stable with BP remaining at goal in office. Recommend he monitor BP a few times a week at home and document. Continue current medication regimen and adjust as needed. Discussed adequate hydration. Labs today: CMP, CBC, TSH, urine ALB.       Relevant Orders   Microalbumin, Urine Waived (Completed)   Comprehensive metabolic panel   TSH   Ventricular tachycardia (HCC) - Primary    Chronic, ongoing. Ablation in 2018. Continue collaboration with cardiology, no recent use of Tambocor.        Relevant Orders   Comprehensive metabolic panel   Lipid Panel w/o Chol/HDL Ratio     Digestive   Eosinophilic esophagitis    Chronic, diagnosed in 2023. Continue collaboration with GI and ENT. Continue current medication regimen.       Relevant Orders   Magnesium     Musculoskeletal and Integument   Osteoporosis of lumbar spine    Chronic, ongoing. DEXA in 08/2022 completed and BMD at [facility] L1-L4 T-score is -2.6. Recommend continue Vitamin D and calcium daily. Educated on this. Discussion of Prolia injection and information given to pt so that he may discuss with his daughter, who is a Software engineer.  Would avoid Fosamax due to eosinophilic esophagitis.      Relevant Orders   VITAMIN D 25 Hydroxy (Vit-D Deficiency, Fractures)     Genitourinary   BPH (benign prostatic hyperplasia)    Check PSA today. No current symptoms.      Relevant Orders   PSA     Hematopoietic and Hemostatic   Thrombocytopenia ([place])    Continue collaboration with hematology/oncology. Mild decrease in platelets.       Relevant Orders   CBC with Differential/Platelet     Other   Bradycardia (Chronic)  Asymptomatic, continue collaboration with cardiology.      Leukopenia (Chronic)    Chronic, ongoing.  Continue collaboration with hematology, recent note and labs reviewed.      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  Recent note reviewed.      Relevant Orders   CBC with Differential/Platelet   Hyperlipidemia    Chronic, ongoing. Continue current medication regimen and adjust as needed. Lipid panel today.       Relevant Orders   Comprehensive metabolic panel   Lipid Panel w/o Chol/HDL Ratio   Other Visit Diagnoses     B12 deficiency       History of low levels, continues supplement and adjust as needed.   Relevant Orders   Vitamin B12   Encounter  for annual physical exam       Annual physical today with labs and health maintenance reviewed, discussed with patient.       Discussed aspirin prophylaxis for myocardial infarction prevention and decision was made to continue ASA  LABORATORY TESTING:  Health maintenance labs ordered today as discussed above.   The natural history of prostate cancer and ongoing controversy regarding screening and potential treatment outcomes of prostate cancer has been discussed with the patient. The meaning of a false positive PSA and a false negative PSA has been discussed. He indicates understanding of the limitations of this screening test and wishes to proceed with screening PSA testing.   IMMUNIZATIONS:   - Tdap: Tetanus vaccination status reviewed: last tetanus booster within 10 years. - Influenza: Up to date - Pneumovax: Up to date - Prevnar: Up to date - Zostavax vaccine: Refused  SCREENING: - Colonoscopy: Up to date  Discussed with patient purpose of the colonoscopy is to detect colon cancer at curable precancerous or early stages   - AAA Screening: Not applicable  -Hearing Test: Not applicable  -Spirometry: Not applicable   PATIENT COUNSELING:    Sexuality: Discussed sexually transmitted diseases, partner selection, use of condoms, avoidance of unintended pregnancy  and contraceptive alternatives.   Advised to avoid cigarette smoking.  I discussed with the patient that most people either abstain from alcohol or drink within safe limits (<=14/week and <=4 drinks/occasion for males, <=7/weeks and <= 3 drinks/occasion for females) and that the risk for alcohol disorders and other health effects rises proportionally with the number of drinks per week and how often a drinker exceeds daily limits.  Discussed cessation/primary prevention of drug use and availability of treatment for abuse.   Diet: Encouraged to adjust caloric intake to maintain  or achieve ideal body weight, to reduce intake  of dietary saturated fat and total fat, to limit sodium intake by avoiding high sodium foods and not adding table salt, and to maintain adequate dietary potassium and calcium preferably from fresh fruits, vegetables, and low-fat dairy products.    Stressed the importance of regular exercise  Injury prevention: Discussed safety belts, safety helmets, smoke detector, smoking near bedding or upholstery.   Dental health: Discussed importance of regular tooth brushing, flossing, and dental visits.   Follow up plan: NEXT PREVENTATIVE PHYSICAL DUE IN 1 YEAR. Return in about 4 weeks (around 02/25/2023) for HTN.

## 2023-01-28 NOTE — Assessment & Plan Note (Signed)
Noted on exam, no current back pain. Continue to monitor and recommend PT as necessary.

## 2023-01-28 NOTE — Assessment & Plan Note (Addendum)
Chronic, stable with BP remaining at goal in office. Recommend he monitor BP a few times a week at home and document. Continue current medication regimen and adjust as needed. Discussed adequate hydration. Labs today: CMP, CBC, TSH, urine ALB.

## 2023-01-28 NOTE — Assessment & Plan Note (Signed)
Chronic, stable at this time with no recent CP. Continue current medication regimen and collaboration with cardiology. No recent NTG use.

## 2023-01-28 NOTE — Assessment & Plan Note (Signed)
Chronic, diagnosed in 2023. Continue collaboration with GI and ENT. Continue current medication regimen.

## 2023-01-28 NOTE — Assessment & Plan Note (Addendum)
Chronic, ongoing. DEXA in 08/2022 completed and BMD at [facility] L1-L4 T-score is -2.6. Recommend continue Vitamin D and calcium daily. Educated on this. Discussion of Prolia injection and information given to pt so that he may discuss with his daughter, who is a Software engineer.  Would avoid Fosamax due to eosinophilic esophagitis.

## 2023-01-28 NOTE — Assessment & Plan Note (Signed)
Continue collaboration with hematology/oncology. Mild decrease in platelets.

## 2023-01-28 NOTE — Assessment & Plan Note (Signed)
Chronic, ongoing. Continue current medication regimen and adjust as needed.  Lipid panel today. 

## 2023-01-28 NOTE — Assessment & Plan Note (Signed)
Asymptomatic, continue collaboration with cardiology.

## 2023-01-29 NOTE — Progress Notes (Signed)
Contacted via MyChart   Good afternoon Shawn Shepard, I am awaiting sodium level but all labs have returned other than that.   - Kidney function, creatinine and eGFR, remains normal, as is liver function, AST and ALT.  - CBC continues to show baseline levels for you, continue visits with hematology. - Calcium very mild elevation, this could be related to recent diet if increased levels calcium in diet and we will monitor. - B12 is above goal, you can try cutting back on B12 to every other day. - Remainder of labs look great.  No medication changes needed.  Any questions? Keep being amazing!!  Thank you for allowing me to participate in your care.  I appreciate you. Kindest regards, [place]

## 2023-01-31 LAB — CBC WITH DIFFERENTIAL/PLATELET
Basophils Absolute: 0 10*3/uL (ref 0.0–0.2)
Basos: 1 %
EOS (ABSOLUTE): 0.2 10*3/uL (ref 0.0–0.4)
Eos: 6 %
Hematocrit: 42 % (ref 37.5–51.0)
Hemoglobin: 14 g/dL (ref 13.0–17.7)
Immature Grans (Abs): 0 10*3/uL (ref 0.0–0.1)
Immature Granulocytes: 0 %
Lymphocytes Absolute: 0.9 10*3/uL (ref 0.7–3.1)
Lymphs: 32 %
MCH: 31.5 pg (ref 26.6–33.0)
MCHC: 33.3 g/dL (ref 31.5–35.7)
MCV: 94 fL (ref 79–97)
Monocytes Absolute: 0.6 10*3/uL (ref 0.1–0.9)
Monocytes: 19 %
Neutrophils Absolute: 1.2 10*3/uL — ABNORMAL LOW (ref 1.4–7.0)
Neutrophils: 42 %
Platelets: 169 10*3/uL (ref 150–450)
RBC: 4.45 x10E6/uL (ref 4.14–5.80)
RDW: 12.9 % (ref 11.6–15.4)
WBC: 3 10*3/uL — ABNORMAL LOW (ref 3.4–10.8)

## 2023-01-31 LAB — COMPREHENSIVE METABOLIC PANEL
ALT: 17 IU/L (ref 0–44)
AST: 24 IU/L (ref 0–40)
Albumin/Globulin Ratio: 2 (ref 1.2–2.2)
Albumin: 4.4 g/dL (ref 3.8–4.8)
Alkaline Phosphatase: 88 IU/L (ref 44–121)
BUN/Creatinine Ratio: 16 (ref 10–24)
BUN: 15 mg/dL (ref 8–27)
Bilirubin Total: 0.8 mg/dL (ref 0.0–1.2)
CO2: 25 mmol/L (ref 20–29)
Calcium: 10.4 mg/dL — ABNORMAL HIGH (ref 8.6–10.2)
Chloride: 101 mmol/L (ref 96–106)
Creatinine, Ser: 0.94 mg/dL (ref 0.76–1.27)
Globulin, Total: 2.2 g/dL (ref 1.5–4.5)
Glucose: 95 mg/dL (ref 70–99)
Potassium: 4.4 mmol/L (ref 3.5–5.2)
Sodium: 142 mmol/L (ref 134–144)
Total Protein: 6.6 g/dL (ref 6.0–8.5)
eGFR: 85 mL/min/{1.73_m2} (ref >59)

## 2023-01-31 LAB — VITAMIN D 25 HYDROXY (VIT D DEFICIENCY, FRACTURES): Vit D, 25-Hydroxy: 47 ng/mL (ref 30.0–100.0)

## 2023-01-31 LAB — LIPID PANEL W/O CHOL/HDL RATIO
Cholesterol, Total: 135 mg/dL (ref 100–199)
HDL: 62 mg/dL (ref >39)
LDL Chol Calc (NIH): 56 mg/dL (ref 0–99)
Triglycerides: 87 mg/dL (ref 0–149)
VLDL Cholesterol Cal: 17 mg/dL (ref 5–40)

## 2023-01-31 LAB — TSH: TSH: 1.13 u[IU]/mL (ref 0.450–4.500)

## 2023-01-31 LAB — PSA: Prostate Specific Ag, Serum: 0.7 ng/mL (ref 0.0–4.0)

## 2023-01-31 LAB — VITAMIN B12: Vitamin B-12: 1407 pg/mL — ABNORMAL HIGH (ref 232–1245)

## 2023-01-31 LAB — MAGNESIUM: Magnesium: 1.9 mg/dL (ref 1.6–2.3)

## 2023-02-10 ENCOUNTER — Other Ambulatory Visit: Payer: Self-pay | Admitting: Nurse Practitioner

## 2023-02-10 DIAGNOSIS — I2510 Atherosclerotic heart disease of native coronary artery without angina pectoris: Secondary | ICD-10-CM

## 2023-02-10 DIAGNOSIS — E7800 Pure hypercholesterolemia, unspecified: Secondary | ICD-10-CM

## 2023-02-11 ENCOUNTER — Other Ambulatory Visit: Payer: Self-pay | Admitting: Nurse Practitioner

## 2023-02-11 NOTE — Telephone Encounter (Signed)
No longer current dosing of this medication Requested Prescriptions  Pending Prescriptions Disp Refills   metoprolol succinate (TOPROL-XL) 25 MG 24 hr tablet [Pharmacy Med Name: METOPROLOL ER SUCCINATE '25MG'$  TABS] 90 tablet 4    Sig: TAKE 1 TABLET(25 MG) BY MOUTH DAILY     Cardiovascular:  Beta Blockers Passed - 02/11/2023  3:29 AM      Passed - Last BP in normal range    BP Readings from Last 1 Encounters:  01/28/23 (!) 108/58         Passed - Last Heart Rate in normal range    Pulse Readings from Last 1 Encounters:  01/28/23 (!) 58         Passed - Valid encounter within last 6 months    Recent Outpatient Visits           2 weeks ago Ventricular tachycardia ([place])   [place], [employer] T, NP   7 months ago Ventricular tachycardia ([place])   [place], Henrine Screws T, NP   9 months ago Ventricular tachycardia ([place])   [place], Henrine Screws T, NP   10 months ago Ventricular tachycardia ([place])   [place], Henrine Screws T, NP   1 year ago Thrombocytopenia [place] [facility])   [place], Barbaraann Faster, NP       Future Appointments             In 3 weeks Venita Lick, NP [place], [place]   In 1 month [name], [place]

## 2023-02-11 NOTE — Telephone Encounter (Signed)
Unable to refill per protocol, Rx request is too soon. Last refill 01/01/22 for 90 days and 4 refills.  Requested Prescriptions  Pending Prescriptions Disp Refills   niacin (NIASPAN) 1000 MG CR tablet [Pharmacy Med Name: NIACIN ER '1000MG'$  TABLETS] 90 tablet 4    Sig: TAKE 1 TABLET(1000 MG) BY MOUTH AT BEDTIME     Cardiovascular:  Antilipid - niacin Failed - 02/10/2023  3:52 PM      Failed - Lipid Panel in normal range within the last 12 months    Cholesterol, Total  Date Value Ref Range Status  01/28/2023 135 100 - 199 mg/dL Final   Cholesterol Piccolo, Waived  Date Value Ref Range Status  05/31/2019 136 <200 mg/dL Final    Comment:                            Desirable                <200                         Borderline High      200- 239                         High                     >239    LDL Chol Calc (NIH)  Date Value Ref Range Status  01/28/2023 56 0 - 99 mg/dL Final   HDL  Date Value Ref Range Status  01/28/2023 62 >39 mg/dL Final   Triglycerides  Date Value Ref Range Status  01/28/2023 87 0 - 149 mg/dL Final   Triglycerides Piccolo,Waived  Date Value Ref Range Status  05/31/2019 87 <150 mg/dL Final    Comment:                            Normal                   <150                         Borderline High     150 - 199                         High                200 - 499                         Very High                >499          Passed - AST in normal range and within 180 days    AST  Date Value Ref Range Status  01/28/2023 24 0 - 40 IU/L Final   AST (SGOT) Piccolo, Waived  Date Value Ref Range Status  05/31/2019 32 11 - 38 U/L Final         Passed - ALT in normal range and within 180 days    ALT  Date Value Ref Range Status  01/28/2023 17 0 - 44 IU/L Final   ALT (SGPT) Piccolo, Waived  Date Value Ref Range Status  05/31/2019 24 10 -  47 U/L Final         Passed - Valid encounter within last 12 months    Recent Outpatient Visits            2 weeks ago Ventricular tachycardia ([place])   [place], [place] T, NP   7 months ago Ventricular tachycardia ([place])   [place], Henrine Screws T, NP   9 months ago Ventricular tachycardia ([place])   [place], Henrine Screws T, NP   10 months ago Ventricular tachycardia [facility])   [place], Henrine Screws T, NP   1 year ago Thrombocytopenia [facility])   [place], [name] T, NP       Future Appointments             In 3 weeks Venita Lick, NP [place], [place]   In 1 month [name], [place]

## 2023-02-15 ENCOUNTER — Other Ambulatory Visit: Payer: Self-pay | Admitting: Cardiovascular Disease

## 2023-02-15 DIAGNOSIS — R600 Localized edema: Secondary | ICD-10-CM

## 2023-02-18 ENCOUNTER — Other Ambulatory Visit: Payer: Self-pay | Admitting: Cardiovascular Disease

## 2023-02-18 DIAGNOSIS — I2510 Atherosclerotic heart disease of native coronary artery without angina pectoris: Secondary | ICD-10-CM

## 2023-02-23 ENCOUNTER — Encounter: Payer: Self-pay | Admitting: Nurse Practitioner

## 2023-02-23 DIAGNOSIS — I2510 Atherosclerotic heart disease of native coronary artery without angina pectoris: Secondary | ICD-10-CM

## 2023-02-23 DIAGNOSIS — E7800 Pure hypercholesterolemia, unspecified: Secondary | ICD-10-CM

## 2023-02-24 MED ORDER — NIACIN ER (ANTIHYPERLIPIDEMIC) 1000 MG PO TBCR: EXTENDED_RELEASE_TABLET | ORAL | 4 refills | Status: DC

## 2023-02-27 ENCOUNTER — Other Ambulatory Visit: Payer: Self-pay | Admitting: Nurse Practitioner

## 2023-02-27 NOTE — Telephone Encounter (Signed)
Requested medication (s) are due for refill today: expired medication  Requested medication (s) are on the active medication list: yes   Last refill:  01/01/22 #90 4 refills  Future visit scheduled: no   Notes to clinic:  expired medication . Do you want to renew Rx?     Requested Prescriptions  Pending Prescriptions Disp Refills   pantoprazole (PROTONIX) 40 MG tablet [Pharmacy Med Name: PANTOPRAZOLE 40MG  TABLETS] 90 tablet 4    Sig: TAKE 1 TABLET(40 MG) BY MOUTH DAILY     Gastroenterology: Proton Pump Inhibitors Passed - 02/27/2023  3:29 AM      Passed - Valid encounter within last 12 months    Recent Outpatient Visits           1 month ago Ventricular tachycardia ([place])   [place], Henrine Screws T, NP   8 months ago Ventricular tachycardia ([place])   [place], Henrine Screws T, NP   10 months ago Ventricular tachycardia ([place])   [place], Henrine Screws T, NP   11 months ago Ventricular tachycardia ([place])   [place], Henrine Screws T, NP   1 year ago Thrombocytopenia [facility])   [place] Venita Lick, NP       Future Appointments             In 1 month [name], MD [place]

## 2023-03-04 ENCOUNTER — Ambulatory Visit: Payer: Medicare PPO | Admitting: Nurse Practitioner

## 2023-03-07 ENCOUNTER — Other Ambulatory Visit: Payer: Self-pay | Admitting: Nurse Practitioner

## 2023-03-07 DIAGNOSIS — I2510 Atherosclerotic heart disease of native coronary artery without angina pectoris: Secondary | ICD-10-CM

## 2023-03-07 DIAGNOSIS — E7800 Pure hypercholesterolemia, unspecified: Secondary | ICD-10-CM

## 2023-03-07 NOTE — Telephone Encounter (Signed)
Requested Prescriptions  Pending Prescriptions Disp Refills   atorvastatin (LIPITOR) 80 MG tablet [Pharmacy Med Name: ATORVASTATIN 80MG  TABLETS] 90 tablet 0    Sig: TAKE 1 TABLET(80 MG) BY MOUTH AT BEDTIME     Cardiovascular:  Antilipid - Statins Failed - 03/07/2023  6:33 AM      Failed - Lipid Panel in normal range within the last 12 months    Cholesterol, Total  Date Value Ref Range Status  01/28/2023 135 100 - 199 mg/dL Final   Cholesterol Piccolo, Waived  Date Value Ref Range Status  05/31/2019 136 <200 mg/dL Final    Comment:                            Desirable                <200                         Borderline High      200- 239                         High                     >239    LDL Chol Calc (NIH)  Date Value Ref Range Status  01/28/2023 56 0 - 99 mg/dL Final   HDL  Date Value Ref Range Status  01/28/2023 62 >39 mg/dL Final   Triglycerides  Date Value Ref Range Status  01/28/2023 87 0 - 149 mg/dL Final   Triglycerides Piccolo,Waived  Date Value Ref Range Status  05/31/2019 87 <150 mg/dL Final    Comment:                            Normal                   <150                         Borderline High     150 - 199                         High                200 - 499                         Very High                >499          Passed - Patient is not pregnant      Passed - Valid encounter within last 12 months    Recent Outpatient Visits           1 month ago Ventricular tachycardia (HCC)   [place] [facility] [place], Corrie Dandy T, NP   8 months ago Ventricular tachycardia (HCC)   [place] [facility] [place], Corrie Dandy T, NP   10 months ago Ventricular tachycardia (HCC)   [place] [facility] [place], Corrie Dandy T, NP   11 months ago Ventricular tachycardia [facility])   [place] [facility] [place], Corrie Dandy T, NP   1 year ago Thrombocytopenia [facility])   [place] [facility]  Marjie Skiff, NP       Future Appointments             In 1 month Laurier Nancy, MD [facility]

## 2023-03-18 ENCOUNTER — Inpatient Hospital Stay (HOSPITAL_BASED_OUTPATIENT_CLINIC_OR_DEPARTMENT_OTHER): Payer: Medicare PPO | Admitting: Oncology

## 2023-03-18 ENCOUNTER — Inpatient Hospital Stay: Payer: Medicare PPO | Attending: Oncology

## 2023-03-18 ENCOUNTER — Other Ambulatory Visit: Payer: Self-pay | Admitting: Nurse Practitioner

## 2023-03-18 ENCOUNTER — Encounter: Payer: Self-pay | Admitting: Oncology

## 2023-03-18 VITALS — BP 117/72 | HR 58 | Temp 97.2°F | Resp 18 | Wt 198.5 lb

## 2023-03-18 DIAGNOSIS — D708 Other neutropenia: Secondary | ICD-10-CM

## 2023-03-18 DIAGNOSIS — D696 Thrombocytopenia, unspecified: Secondary | ICD-10-CM

## 2023-03-18 DIAGNOSIS — Z87891 Personal history of nicotine dependence: Secondary | ICD-10-CM | POA: Insufficient documentation

## 2023-03-18 DIAGNOSIS — D72820 Lymphocytosis (symptomatic): Secondary | ICD-10-CM | POA: Diagnosis not present

## 2023-03-18 DIAGNOSIS — Z7982 Long term (current) use of aspirin: Secondary | ICD-10-CM | POA: Diagnosis not present

## 2023-03-18 DIAGNOSIS — Z79899 Other long term (current) drug therapy: Secondary | ICD-10-CM | POA: Diagnosis not present

## 2023-03-18 DIAGNOSIS — C9110 Chronic lymphocytic leukemia of B-cell type not having achieved remission: Secondary | ICD-10-CM | POA: Diagnosis present

## 2023-03-18 LAB — LACTATE DEHYDROGENASE: LDH: 138 U/L (ref 98–192)

## 2023-03-18 LAB — CBC WITH DIFFERENTIAL/PLATELET
Abs Immature Granulocytes: 0.01 10*3/uL (ref 0.00–0.07)
Basophils Absolute: 0 10*3/uL (ref 0.0–0.1)
Basophils Relative: 1 %
Eosinophils Absolute: 0.2 10*3/uL (ref 0.0–0.5)
Eosinophils Relative: 6 %
HCT: 41.4 % (ref 39.0–52.0)
Hemoglobin: 13.7 g/dL (ref 13.0–17.0)
Immature Granulocytes: 0 %
Lymphocytes Relative: 31 %
Lymphs Abs: 0.9 10*3/uL (ref 0.7–4.0)
MCH: 31.4 pg (ref 26.0–34.0)
MCHC: 33.1 g/dL (ref 30.0–36.0)
MCV: 95 fL (ref 80.0–100.0)
Monocytes Absolute: 0.5 10*3/uL (ref 0.1–1.0)
Monocytes Relative: 16 %
Neutro Abs: 1.3 10*3/uL — ABNORMAL LOW (ref 1.7–7.7)
Neutrophils Relative %: 46 %
Platelets: 153 10*3/uL (ref 150–400)
RBC: 4.36 MIL/uL (ref 4.22–5.81)
RDW: 13.4 % (ref 11.5–15.5)
WBC: 2.8 10*3/uL — ABNORMAL LOW (ref 4.0–10.5)
nRBC: 0 % (ref 0.0–0.2)

## 2023-03-18 LAB — COMPREHENSIVE METABOLIC PANEL
ALT: 18 U/L (ref 0–44)
AST: 23 U/L (ref 15–41)
Albumin: 3.8 g/dL (ref 3.5–5.0)
Alkaline Phosphatase: 62 U/L (ref 38–126)
Anion gap: 4 — ABNORMAL LOW (ref 5–15)
BUN: 20 mg/dL (ref 8–23)
CO2: 26 mmol/L (ref 22–32)
Calcium: 9.9 mg/dL (ref 8.9–10.3)
Chloride: 104 mmol/L (ref 98–111)
Creatinine, Ser: 0.99 mg/dL (ref 0.61–1.24)
GFR, Estimated: >60 mL/min (ref >60)
Glucose, Bld: 76 mg/dL (ref 70–99)
Potassium: 4.5 mmol/L (ref 3.5–5.1)
Sodium: 134 mmol/L — ABNORMAL LOW (ref 135–145)
Total Bilirubin: 0.8 mg/dL (ref 0.3–1.2)
Total Protein: 6.7 g/dL (ref 6.5–8.1)

## 2023-03-18 NOTE — Assessment & Plan Note (Signed)
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  No constitutional symptoms.   Recommend observation. 

## 2023-03-18 NOTE — Progress Notes (Signed)
Pt here for follow up. No new concerns voiced.   

## 2023-03-18 NOTE — Assessment & Plan Note (Signed)
Chronic leukopenia, predominantly neutropenia Previous work-up -including bone marrow biopsy were nonremarkable. 10/26/2019 bone marrow biopsy showed no significant dyspoiesis or increase in blast cells.  No significant lymphoid aggregates.  Normal cytogenetics. Labs reviewed and discussed with patient ANC is stable.  Continue observation. 

## 2023-03-18 NOTE — Progress Notes (Signed)
Hematology/Oncology Progress note [phone] [phone]      CHIEF COMPLAINTS/REASON FOR VISIT:  Follow-up for management of leukopenia and thrombocytopenia,   ASSESSMENT & PLAN:   Monoclonal B-cell lymphocytosis of undetermined significance Previous peripheral blood flow cytometry showed 2 minute CD5/CD23 positive monoclonal B-cell population, CLL/SLL phenotype.   No constitutional symptoms.   Recommend observation.  Leukopenia Chronic leukopenia, predominantly neutropenia Previous work-up -including bone marrow biopsy were nonremarkable. 10/26/2019 bone marrow biopsy showed no significant dyspoiesis or increase in blast cells.  No significant lymphoid aggregates.  Normal cytogenetics. Labs reviewed and discussed with patient ANC is stable.  Continue observation.  Orders Placed This Encounter  Procedures   CBC with Differential (Cancer Center Only)    Standing Status:   Future    Standing Expiration Date:   03/17/2024   CMP (Cancer Center only)    Standing Status:   Future    Standing Expiration Date:   03/17/2024   Lactate dehydrogenase    Standing Status:   Future    Standing Expiration Date:   03/17/2024  Follow-up 6 months   all questions were answered. The patient knows to call the clinic with any problems, questions or concerns.  Rickard Patience, MD, PhD [facility] Hematology Oncology 03/18/2023     HISTORY OF PRESENTING ILLNESS:  Shawn HACKLER Sr. is a  77 y.o.  male with PMH listed below who was referred to me for evaluation of leukopenia. Reviewed patient's recent labs. Patient has low total WBC count was 2.6, predominantly absolute lymphocyte 0.6. Earlier lab lab done 11/2019 showed WBC 2.8, lymphocyte 0.7, neutrophil 1.4.  Previous lab records reviewed. 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 No aggravating or improving factors.  Associated symptoms:  denies fatigue, weight loss, fever, chills, frequent  infection.  History hepatitis or HIV infection: Denies History of chronic liver disease: Denies History of blood transfusion: Denies Alcohol consumption: Denies Diet Vegetarian or Vegan: Denies Herbal medication: Denies  Reports feeling quite tired and fatigued recently.  He and his wife recently downsized to a one-story home and he has been moving a lot of furniture's by himself.  He feels it took a week for his knee to recover after the most. Denies any unintentional weight loss, fever or chills, night sweating. He is very active, walks every day. Previous occupation was a Radiation protection practitioner.  Drinks a cocktail every day.  Usually drinks cocktail with quinine tonic water for lower extremity cramps.  He started to do this after his ventricular tachycardic episodes about 1-1/2 years ago. Former smoker, quitting in 1972.   chronic back pain and had a lumbar spine x-ray done on 12/20/2020 which showed multilevel moderate lumbar degenerative disc disease, spine spondylolisthesis, facet arthropathy  INTERVAL HISTORY Shawn Kindred Tsuda Sr. is a 77 y.o. male who has above history reviewed by me today presents for follow up visit for management of leukopenia, monoclonal lymphocytosis of unknown significance No new complaints.  Denies weight loss, fever, chills, fatigue, night sweats.    Review of Systems  Constitutional:  Positive for malaise/fatigue. Negative for chills, fever and weight loss.  HENT:  Negative for sore throat.   Eyes:  Negative for redness.  Respiratory:  Negative for cough, shortness of breath and wheezing.   Cardiovascular:  Negative for chest pain, palpitations and leg swelling.  Gastrointestinal:  Negative for abdominal pain, blood in stool, nausea and vomiting.  Genitourinary:  Negative for dysuria.  Musculoskeletal:  Negative for myalgias.  Skin:  Negative for rash.  Neurological:  Negative for dizziness, tingling and tremors.  Endo/Heme/Allergies:  Does not  bruise/bleed easily.  Psychiatric/Behavioral:  Negative for hallucinations.     MEDICAL HISTORY:  Past Medical History:  Diagnosis Date   Arthritis    Basal cell carcinoma 1999   basal cell/skin   CAD (coronary artery disease)    Cataract Cataract surgery 2017   GERD (gastroesophageal reflux disease)    History of bone marrow biopsy    History of kidney stones 2013   passed stone on his own   Hyperlipidemia    Hypertension    Leukopenia    Shingles    Ventricular tachycardia     SURGICAL HISTORY: Past Surgical History:  Procedure Laterality Date   CARDIAC CATHETERIZATION N/A 01/16/2016   Procedure: Left Heart Cath and Coronary Angiography;  Surgeon: Laurier Nancy, MD;  Location: [facility] INVASIVE CV LAB;  Service: Cardiovascular;  Laterality: N/A;   CATARACT EXTRACTION Left 2017   COLONOSCOPY WITH PROPOFOL N/A 03/27/2020   Procedure: COLONOSCOPY WITH PROPOFOL;  Surgeon: Midge Minium, MD;  [facility];  Service: Endoscopy;  Laterality: N/A;  priority 4   CORONARY ANGIOPLASTY  2000 and 2003   ESOPHAGOGASTRODUODENOSCOPY (EGD) WITH PROPOFOL N/A 04/01/2022   Procedure: ESOPHAGOGASTRODUODENOSCOPY (EGD) WITH PROPOFOL;  Surgeon: Wyline Mood, MD;  Location: [facility] ENDOSCOPY;  Service: Gastroenterology;  Laterality: N/A;   EYE SURGERY  2017   Cateract   EYE SURGERY Left 04/09/2022   GANGLION CYST EXCISION  1979   HAND SURGERY Right 02/08/2021   blood clot removal per patient   KNEE ARTHROSCOPY WITH MEDIAL MENISECTOMY Left 12/01/2017   Procedure: KNEE ARTHROSCOPY WITH MEDIAL MENISECTOMY;  Surgeon: Lyndle Herrlich, MD;  Location: [facility] ORS;  Service: Orthopedics;  Laterality: Left;  Partial   LEFT HEART CATH Right 08/07/2017   Procedure: Left Heart Cath;  Surgeon: Laurier Nancy, MD;  [facility] INV[facility] LAB;  Service: Cardiovascular;  Laterality: Right;   PILONIDAL CYST EXCISION  1977   POLYPECTOMY  03/27/2020   Procedure: POLYPECTOMY;  Surgeon: Midge Minium,  MD;  Location: [facility];  Service: Endoscopy;;   SKIN CANCER EXCISION  1999   ear   SYNOVECTOMY Left 12/01/2017   Procedure: SYNOVECTOMY;  Surgeon: Lyndle Herrlich, MD;  Location: [facility] ORS;  Service: Orthopedics;  Laterality: Left;  Partial   V TACH ABLATION N/A 08/14/2017   Procedure: V Tach Ablation;  Surgeon: Regan Lemming, MD;  Location: [facility] INVASIVE CV LAB;  Service: Cardiovascular;  Laterality: N/A;   VEIN SURGERY  2000   laser    SOCIAL HISTORY: Social History   Socioeconomic History   Marital status: Married    Spouse name: Not on file   Number of children: 2   Years of education: Not on file   Highest education level: Master's degree (e.g., MA, MS, MEng, MEd, MSW, MBA)  Occupational History   Occupation: retired  Tobacco Use   Smoking status: Former    Packs/day: 1.00    Years: 5.00    Additional pack years: 0.00    Total pack years: 5.00    Types: Cigarettes    Quit date: 06/21/1971    Years since quitting: 51.7   Smokeless tobacco: Former    Types: Chew    Quit date: 06/20/1980  Vaping Use   Vaping Use: Never used  Substance and Sexual Activity   Alcohol use: Not Currently    Alcohol/week: 0.0 standard drinks of  alcohol   Drug use: No   Sexual activity: Not Currently  Other Topics Concern   Not on file  Social History Narrative   Lives in [place]   Retired school principal   Social Determinants of Health   Financial Resource Strain: Low Risk  (10/28/2022)   Overall Financial Resource Strain (CARDIA)    Difficulty of Paying Living Expenses: Not hard at all  Food Insecurity: No Food Insecurity (10/28/2022)   Hunger Vital Sign    Worried About Running Out of Food in the Last Year: Never true    Ran Out of Food in the Last Year: Never true  Transportation Needs: No Transportation Needs (10/28/2022)   PRAPARE - Administrator, Civil Service (Medical): No    Lack of Transportation (Non-Medical): No  Physical Activity:  Insufficiently Active (10/28/2022)   Exercise Vital Sign    Days of Exercise per Week: 3 days    Minutes of Exercise per Session: 30 min  Stress: No Stress Concern Present (10/28/2022)   Shawn Shepard of Occupational Health - Occupational Stress Questionnaire    Feeling of Stress : Not at all  Social Connections: Socially Integrated (10/28/2022)   Social Connection and Isolation Panel [NHANES]    Frequency of Communication with Friends and Family: More than three times a week    Frequency of Social Gatherings with Friends and Family: Once a week    Attends Religious Services: More than 4 times per year    Active Member of [employer] or Organizations: Yes    Attends Engineer, structural: More than 4 times per year    Marital Status: Married  Catering manager Violence: Not At Risk (10/28/2022)   Humiliation, Afraid, Rape, and Kick questionnaire    Fear of Current or Ex-Partner: No    Emotionally Abused: No    Physically Abused: No    Sexually Abused: No    FAMILY HISTORY: Family History  Problem Relation Age of Onset   Hypertension Mother    Heart disease Mother    Heart attack Mother    Diabetes Mother    Alzheimer's disease Father    Alzheimer's disease Brother    Congestive Heart Failure Brother    Hypertension Brother    Other Brother        dementia   Heart disease Brother    Brain cancer Daughter 26   Cancer Daughter    Heart disease Son    Cancer Maternal Uncle    Cancer Maternal Grandfather     ALLERGIES:  is allergic to egg-derived products and peanut (diagnostic).  MEDICATIONS:  Current Outpatient Medications  Medication Sig Dispense Refill   acetaminophen (TYLENOL) 500 MG tablet Take 1,000 mg by mouth every 6 (six) hours as needed for moderate pain or headache.     ALLERGY RELIEF 180 MG tablet TAKE 1 TABLET BY MOUTH EVERY DAY 90 tablet 1   aspirin EC 81 MG tablet Take 81 mg by mouth daily.     atorvastatin (LIPITOR) 80 MG tablet TAKE 1 TABLET(80  MG) BY MOUTH AT BEDTIME 90 tablet 0   Cholecalciferol (VITAMIN D3) 50 MCG (2000 UT) CHEW Chew 1 tablet by mouth daily.     flecainide (TAMBOCOR) 100 MG tablet Take 2 tablets (200 mg total) as needed by mouth. As needed for recurrent tachycardia.  Do not repeat more than once per day or 3x per wk 2 tablet 3   furosemide (LASIX) 20 MG tablet TAKE 1 TABLET BY MOUTH  EVERY DAY 90 tablet 0   metoprolol succinate (TOPROL-XL) 50 MG 24 hr tablet TAKE 1 TABLET BY MOUTH EVERY DAY 90 tablet 0   niacin (NIASPAN) 1000 MG CR tablet TAKE 1 TABLET(1000 MG) BY MOUTH AT BEDTIME 90 tablet 4   pantoprazole (PROTONIX) 40 MG tablet TAKE 1 TABLET(40 MG) BY MOUTH DAILY 90 tablet 4   ramipril (ALTACE) 10 MG capsule Take 10 mg by mouth daily.     vitamin B-12 (CYANOCOBALAMIN) 1000 MCG tablet Take 1 tablet (1,000 mcg total) by mouth daily. 90 tablet 0   nitroGLYCERIN (NITROSTAT) 0.4 MG SL tablet  (Patient not taking: Reported on 03/18/2023)     No current facility-administered medications for this visit.     PHYSICAL EXAMINATION: ECOG PERFORMANCE STATUS: 0 - Asymptomatic Vitals:   03/18/23 0921  BP: 117/72  Pulse: (!) 58  Resp: 18  Temp: (!) 97.2 F (36.2 C)   Filed Weights   03/18/23 0921  Weight: 198 lb 8 oz (90 kg)    Physical Exam Constitutional:      General: He is not in acute distress. HENT:     Head: Normocephalic and atraumatic.  Eyes:     General: No scleral icterus.    Pupils: Pupils are equal, round, and reactive to light.  Cardiovascular:     Rate and Rhythm: Normal rate and regular rhythm.     Heart sounds: Normal heart sounds.  Pulmonary:     Effort: Pulmonary effort is normal. No respiratory distress.     Breath sounds: No wheezing.  Abdominal:     General: Bowel sounds are normal. There is no distension.     Palpations: Abdomen is soft. There is no mass.     Tenderness: There is no abdominal tenderness.  Musculoskeletal:        General: No deformity. Normal range of motion.      Cervical back: Normal range of motion and neck supple.  Skin:    General: Skin is warm and dry.     Findings: No erythema or rash.  Neurological:     Mental Status: He is alert and oriented to person, place, and time.     Cranial Nerves: No cranial nerve deficit.     Coordination: Coordination normal.  Psychiatric:        Behavior: Behavior normal.        Thought Content: Thought content normal.      LABORATORY DATA:  I have reviewed the data as listed    Latest Ref Rng & Units 03/18/2023    9:27 AM 01/28/2023    2:18 PM 09/20/2022    9:42 AM  CBC  WBC 4.0 - 10.5 K/uL 2.8  3.0  2.9   Hemoglobin 13.0 - 17.0 g/dL 95.6  21.3  08.6   Hematocrit 39.0 - 52.0 % 41.4  42.0  40.9   Platelets 150 - 400 K/uL 153  169  148       Latest Ref Rng & Units 03/18/2023    9:27 AM 01/28/2023    2:18 PM 09/20/2022    9:42 AM  CMP  Glucose 70 - 99 mg/dL 76  95  99   BUN 8 - 23 mg/dL Creatinine 0.61 - 1.24 mg/dL 5.78  4.69  6.29   Sodium 135 - 145 mmol/L 134  142  137   Potassium 3.5 - 5.1 mmol/L 4.5  4.4  4.5   Chloride 98 - 111 mmol/L 104  101  106   CO2 22 - 32 mmol/L Calcium 8.9 - 10.3 mg/dL 9.9  44.0  9.9   Total Protein 6.5 - 8.1 g/dL 6.7  6.6  6.7   Total Bilirubin 0.3 - 1.2 mg/dL 0.8  0.8  1.3   Alkaline Phos 38 - 126 U/L 62  88  65   AST 15 - 41 U/L ALT 0 - 44 U/L Iron/TIBC/Ferritin/ %Sat No results found for: "IRON", "TIBC", "FERRITIN", "IRONPCTSAT"

## 2023-03-19 NOTE — Telephone Encounter (Signed)
Unable to refill per protocol, Rx request is too soon. Last refill 10/16/22 for 90 and 1 refill.  Requested Prescriptions  Pending Prescriptions Disp Refills   ALLERGY RELIEF 180 MG tablet [Pharmacy Med Name: FEXOFENADINE 180MG  TABLETS (OTC)] 90 tablet 1    Sig: TAKE 1 TABLET BY MOUTH EVERY DAY     Ear, Nose, and Throat:  Antihistamines Passed - 03/18/2023  6:28 AM      Passed - Valid encounter within last 12 months    Recent Outpatient Visits           1 month ago Ventricular tachycardia (HCC)   [place] [facility] [place], Corrie Dandy T, NP   8 months ago Ventricular tachycardia (HCC)   [place] [facility] [place], Corrie Dandy T, NP   10 months ago Ventricular tachycardia (HCC)   [place] [facility] [place], Corrie Dandy T, NP   11 months ago Ventricular tachycardia (HCC)   [place] [facility] [place], Corrie Dandy T, NP   1 year ago Thrombocytopenia [facility])   [place] [facility] Marjie Skiff, NP       Future Appointments             In 3 weeks Laurier Nancy, MD [facility]

## 2023-03-20 LAB — COMP PANEL: LEUKEMIA/LYMPHOMA

## 2023-04-10 ENCOUNTER — Ambulatory Visit: Payer: Medicare PPO | Admitting: Cardiovascular Disease

## 2023-04-14 ENCOUNTER — Ambulatory Visit: Payer: Medicare PPO | Admitting: Cardiovascular Disease

## 2023-04-14 ENCOUNTER — Encounter: Payer: Self-pay | Admitting: Cardiovascular Disease

## 2023-04-14 VITALS — BP 102/70 | HR 79 | Ht 71.0 in | Wt 196.8 lb

## 2023-04-14 DIAGNOSIS — I2510 Atherosclerotic heart disease of native coronary artery without angina pectoris: Secondary | ICD-10-CM | POA: Diagnosis not present

## 2023-04-14 DIAGNOSIS — E7800 Pure hypercholesterolemia, unspecified: Secondary | ICD-10-CM | POA: Diagnosis not present

## 2023-04-14 DIAGNOSIS — I10 Essential (primary) hypertension: Secondary | ICD-10-CM | POA: Diagnosis not present

## 2023-04-14 DIAGNOSIS — I2583 Coronary atherosclerosis due to lipid rich plaque: Secondary | ICD-10-CM

## 2023-04-14 NOTE — Assessment & Plan Note (Signed)
Patient doing well. Denies chest pain. Has started walking two miles daily with no problems.

## 2023-04-14 NOTE — Assessment & Plan Note (Signed)
Well controlled. Denies dizziness. Patient has started walking 2 miles daily. Will notify office if he develops dizziness.

## 2023-04-14 NOTE — Progress Notes (Signed)
Cardiology Office Note   Date:  04/14/2023   ID:  Shawn Decant Sr., DOB 19-Apr-1946, MRN [id number]  PCP:  Marjie Skiff, NP  Cardiologist:  Adrian Blackwater, MD      History of Present Illness: Shawn Decant Sr. is a 77 y.o. male who presents for  Chief Complaint  Patient presents with   Follow-up    4 month follow up    Patient in office for routine cardiac exam. Denies chest pain, shortness of breath, edema, palpitations. Has recently started walking daily.     Past Medical History:  Diagnosis Date   Arthritis    Basal cell carcinoma 1999   basal cell/skin   CAD (coronary artery disease)    Cataract Cataract surgery 2017   GERD (gastroesophageal reflux disease)    History of bone marrow biopsy    History of kidney stones 2013   passed stone on his own   Hyperlipidemia    Hypertension    Leukopenia    Shingles    Ventricular tachycardia [facility])      Past Surgical History:  Procedure Laterality Date   CARDIAC CATHETERIZATION N/A 01/16/2016   Procedure: Left Heart Cath and Coronary Angiography;  Surgeon: Laurier Nancy, MD;  Location: [facility] INVASIVE CV LAB;  Service: Cardiovascular;  Laterality: N/A;   CATARACT EXTRACTION Left 2017   COLONOSCOPY WITH PROPOFOL N/A 03/27/2020   Procedure: COLONOSCOPY WITH PROPOFOL;  Surgeon: Midge Minium, MD;  Location: [facility];  Service: Endoscopy;  Laterality: N/A;  priority 4   CORONARY ANGIOPLASTY  2000 and 2003   ESOPHAGOGASTRODUODENOSCOPY (EGD) WITH PROPOFOL N/A 04/01/2022   Procedure: ESOPHAGOGASTRODUODENOSCOPY (EGD) WITH PROPOFOL;  Surgeon: Wyline Mood, MD;  Location: [facility] END[facility];  Service: Gastroenterology;  Laterality: N/A;   EYE SURGERY  2017   Cateract   EYE SURGERY Left 04/09/2022   GANGLION CYST EXCISION  1979   HAND SURGERY Right 02/08/2021   blood clot removal per patient   KNEE ARTHROSCOPY WITH MEDIAL MENISECTOMY Left 12/01/2017   Procedure: KNEE ARTHROSCOPY WITH MEDIAL MENISECTOMY;   Surgeon: Lyndle Herrlich, MD;  Location: [facility] ORS;  Service: Orthopedics;  Laterality: Left;  Partial   LEFT HEART CATH Right 08/07/2017   Procedure: Left Heart Cath;  Surgeon: Laurier Nancy, MD;  Location: [facility] INVASIVE CV LAB;  Service: Cardiovascular;  Laterality: Right;   PILONIDAL CYST EXCISION  1977   POLYPECTOMY  03/27/2020   Procedure: POLYPECTOMY;  Surgeon: Midge Minium, MD;  Location: [facility];  Service: Endoscopy;;   SKIN CANCER EXCISION  1999   ear   SYNOVECTOMY Left 12/01/2017   Procedure: SYNOVECTOMY;  Surgeon: Lyndle Herrlich, MD;  Location: [facility] ORS;  Service: Orthopedics;  Laterality: Left;  Partial   V TACH ABLATION N/A 08/14/2017   Procedure: V Tach Ablation;  Surgeon: Regan Lemming, MD;  Location: [facility] INVASIVE CV LAB;  Service: Cardiovascular;  Laterality: N/A;   VEIN SURGERY  2000   laser     Current Outpatient Medications  Medication Sig Dispense Refill   acetaminophen (TYLENOL) 500 MG tablet Take 1,000 mg by mouth every 6 (six) hours as needed for moderate pain or headache.     ALLERGY RELIEF 180 MG tablet TAKE 1 TABLET BY MOUTH EVERY DAY 90 tablet 1   aspirin EC 81 MG tablet Take 81 mg by mouth daily.     atorvastatin (LIPITOR) 80 MG tablet TAKE 1 TABLET(80 MG) BY MOUTH AT BEDTIME 90 tablet 0  Cholecalciferol (VITAMIN D3) 50 MCG (2000 UT) CHEW Chew 1 tablet by mouth daily.     flecainide (TAMBOCOR) 100 MG tablet Take 2 tablets (200 mg total) as needed by mouth. As needed for recurrent tachycardia.  Do not repeat more than once per day or 3x per wk 2 tablet 3   furosemide (LASIX) 20 MG tablet TAKE 1 TABLET BY MOUTH EVERY DAY 90 tablet 0   metoprolol succinate (TOPROL-XL) 50 MG 24 hr tablet TAKE 1 TABLET BY MOUTH EVERY DAY 90 tablet 0   niacin (NIASPAN) 1000 MG CR tablet TAKE 1 TABLET(1000 MG) BY MOUTH AT BEDTIME 90 tablet 4   pantoprazole (PROTONIX) 40 MG tablet TAKE 1 TABLET(40 MG) BY MOUTH DAILY 90 tablet 4   ramipril (ALTACE) 10 MG capsule  Take 10 mg by mouth daily.     vitamin B-12 (CYANOCOBALAMIN) 1000 MCG tablet Take 1 tablet (1,000 mcg total) by mouth daily. 90 tablet 0   No current facility-administered medications for this visit.    Allergies:   Egg-derived products and Peanut (diagnostic)    Social History:   reports that he quit smoking about 51 years ago. His smoking use included cigarettes. He has a 5.00 pack-year smoking history. He quit smokeless tobacco use about 42 years ago.  His smokeless tobacco use included chew. He reports that he does not currently use alcohol. He reports that he does not use drugs.   Family History:  family history includes Alzheimer's disease in his brother and father; Brain cancer (age of onset: 68) in his daughter; Cancer in his daughter, maternal grandfather, and maternal uncle; Congestive Heart Failure in his brother; Diabetes in his mother; Heart attack in his mother; Heart disease in his brother, mother, and son; Hypertension in his brother and mother; Other in his brother.    ROS:     Review of Systems  Constitutional: Negative.   HENT: Negative.    Eyes: Negative.   Respiratory: Negative.    Cardiovascular: Negative.   Gastrointestinal: Negative.   Genitourinary: Negative.   Musculoskeletal: Negative.   Skin: Negative.   Neurological: Negative.   Endo/Heme/Allergies: Negative.   Psychiatric/Behavioral: Negative.    All other systems reviewed and are negative.    All other systems are reviewed and negative.    PHYSICAL EXAM: VS:  BP 102/70   Pulse 79   Ht 5\' 11"  (1.803 m)   Wt 196 lb 12.8 oz (89.3 kg)   SpO2 95%   BMI 27.45 kg/m  , BMI Body mass index is 27.45 kg/m. Last weight:  Wt Readings from Last 3 Encounters:  04/14/23 196 lb 12.8 oz (89.3 kg)  03/18/23 198 lb 8 oz (90 kg)  01/28/23 199 lb 14.4 oz (90.7 kg)     Physical Exam Vitals reviewed.  Constitutional:      Appearance: Normal appearance. He is normal weight.  HENT:     Head: Normocephalic.      Nose: Nose normal.     Mouth/Throat:     Mouth: Mucous membranes are moist.  Eyes:     Pupils: Pupils are equal, round, and reactive to light.  Cardiovascular:     Rate and Rhythm: Normal rate and regular rhythm.     Pulses: Normal pulses.     Heart sounds: Normal heart sounds.  Pulmonary:     Effort: Pulmonary effort is normal.  Abdominal:     General: Abdomen is flat. Bowel sounds are normal.  Musculoskeletal:  General: Normal range of motion.     Cervical back: Normal range of motion.  Skin:    General: Skin is warm.  Neurological:     General: No focal deficit present.     Mental Status: He is alert.  Psychiatric:        Mood and Affect: Mood normal.     EKG: none today  Recent Labs: 01/28/2023: Magnesium 1.9; TSH 1.130 03/18/2023: ALT 18; BUN 20; Creatinine, Ser 0.99; Hemoglobin 13.7; Platelets 153; Potassium 4.5; Sodium 134    Lipid Panel    Component Value Date/Time   CHOL 135 01/28/2023 1418   CHOL 136 05/31/2019 0852   TRIG 87 01/28/2023 1418   TRIG 87 05/31/2019 0852   HDL 62 01/28/2023 1418   CHOLHDL 2.2 11/09/2018 1005   VLDL 17 05/31/2019 0852   LDLCALC 56 01/28/2023 1418      Other studies Reviewed: Patient: 3623.0 - Reichen A. Gindlesperger DOB:  30-Jun-1946   Date:  04/04/2022 11:30 Provider: Adrian Blackwater MD Encounter: ECHO   Page 2 REASON FOR VISIT  Visit for: Echocardiogram/R07.9  Sex:   Male  wt=200    lbs.  BP=130/82  Height=73    inches.   TESTS  Imaging: Echocardiogram:  An echocardiogram in (2-d) mode was performed and in Doppler mode with color flow velocity mapping was performed. The aortic valve cusps are abnormal 1.6    cm, flow velocity 1.06   m/s, and systolic calculated mean flow gradient 2   mmHg. Mitral valve diastolic peak flow velocity E .661     m/s and E/A ratio 1.1. Aortic root diameter 4.0   cm. The LVOT internal diameter 4.8   cm and flow velocity was abnormal .699   m/s. LV systolic dimension 2.14   cm,  diastolic 4.95   cm, posterior wall thickness 1.08  cm, fractional shortening 56.8  %, and EF 87  %. IVS thickness 1.16 cm. LA dimension 4.7 cm. Mitral Valve has Trace Regurgitation. Aortic Valve has Trace Regurgitation. Tricuspid Valve has Trace Regurgitation.     ASSESSMENT  Technically adequate study.  Normal chamber sizes.  Normal left ventricular systolic function.  Mild left ventricular hypertrophy with GRADE 1 (relaxation abnormality) diastolic dysfunction.  Normal right ventricular systolic function.  Normal right ventricular diastolic function.  Normal left ventricular wall motion.  Normal right ventricular wall motion.  Trace tricuspid regurgitation.  Normal pulmonary artery pressure.  Trace mitral regurgitation.  Trace aortic regurgitation.  No pericardial effusion.  Mildly dilated Left atrium  Mildly dilated Ao root and ascending Ao  Mild LVH.   THERAPY   Referring physician: Laurier Nancy  Sonographer: Adrian Blackwater.   Adrian Blackwater MD  Electronically signed by: Adrian Blackwater     Date: 04/04/2022 14:07  Patient: 3623.0 - Doss A. Bowe DOB:  [birth year]  Date:  04/04/2022 07:30 Provider: Adrian Blackwater MD Encounter: NUCLEAR STRESS TEST   Page 2 TESTS                                                                                          [facility] [address] [place]  [place], [place] [address] [phone] STUDY:  Gated Stress / Rest Myocardial Perfusion Imaging Tomographic (SPECT) Including attenuation correction Wall Motion, Left Ventricular Ejection Fraction By Gated Technique.Treadmill Stress Test. SEX: Male   WEIGHT: 190 lbs  HEIGHT: 71 in    ARMS UP: YES/NO                                                                        REFERRING PHYSICIAN: Dr.Zykia Walla [place] Flakes                                                                                                                                                                                                                        INDICATION FOR STUDY: CP                                                                                                                                                                                                                     TECHNIQUE:  Approximately 20 minutes following the intravenous administration of 10. of Tc-53m Sestamibi after stress testing in a reclined supine position with arms above their head if able to do so, gated SPECT imaging of the heart was performed. After about a 2hr break, the patient  was injected intravenously with 32.5 mCi of Tc-62m Sestamibi.  Approximately 45 minutes later in the same position as stress imaging SPECT rest imaging of the heart was performed.  STRESS BY:  Adrian Blackwater, MD PROTOCOL:   Smitty Cords                                                                                       MAX PRED HR: 146                     85%: 124               75%:  110                                                                                                                  RESTING BP: 110/72  RESTING HR: 77 PEAK BP: 150/88   PEAK HR:  121 (82%)                                                                   EXERCISE DURATION: 7:00                                             METS:  8.5    REASON FOR TEST TERMINATION: SOB.                                                                                                                                  SYMPTOMS: SOB.  [place]KE TREADMILL SCORE: 7                                       RISK: Low  EKG RESULTS: NSR. 77/min. No significant ST depression at peak exercise.                                                              IMAGE QUALITY: Good                                                                                                                                                                                                                                                                                                                                   PERFUSION/WALL MOTION FINDINGS: EF = 62%. Large severe reversible basal and mid anteroseptal and inferoseptal, basal, mid and apical inferior wall defects, normal wall motion.                                                                          IMPRESSION: Ischemia in the LAD/RCA territories with normal LVEF, advise further workup.  Adrian Blackwater, MD Stress Interpreting Physician / Nuclear Interpreting Physician        Adrian Blackwater MD  Electronically signed by: Adrian Blackwater     Date: 04/05/2022 11:57  Patient: 3623.0 - Ezariah A. Lalli DOB:  Mar 06, 1946  Date:  08/29/2020 09:30 Provider: Adrian Blackwater MD Encounter: ALL ANGIOGRAMS (CTA BRAIN, CAROTIDS, RENAL ARTERIES, PE)   TESTS  Imaging: Computed Tomographic Angiography:  Cardiac multidetector CT was performed paying particular attention to the coronary arteries for the diagnosis of: Shortness of breath. Diagnostic Drugs:  Administered iohexol (Omnipaque) through an antecubital vein and images from the examination were analyzed for the presence and extent of coronary artery disease, using 3D image processing software. 100 mL of non-ionic contrast (Omnipaque) was used.     ASSESSMENT   The left main artery was abnormal:2  The proximal LAD artery are abnormal:2  The mid-LAD artery was abnormal:2  The distal LAD artery was abnormal:2  The proximal circumflex artery was abnormal:2  The  distal circumflex artery was abnormal:2  The first obtuse marginal branch artery (OM-1) was abnormal:2  The proximal right coronary artery (RCA) was abnormal:2  The mid right coronary artery (RCA) was abnormal:2  The distal right coronary artery (RCA) was abnormal:2  The posterior descending coronary arteries were abnormal:2     TEST CONCLUSIONS  Quality of study: Excellent  1-Calcium score: 3302.8  2-Right dominant system.  3-Severely calcified LAD,LCX, and RCA with mild diffuse disease. LCX has mild irregularities.  Adrian Blackwater MD  Electronically signed by: Adrian Blackwater     Date: 08/31/2020 15:44   ASSESSMENT AND PLAN:    ICD-10-CM   1. Coronary artery disease due to lipid rich plaque  I25.10    I25.83     2. Essential hypertension  I10     3. Pure hypercholesterolemia  E78.00        Problem List Items Addressed This Visit       Cardiovascular and Mediastinum   CAD (coronary artery disease) - Primary    Patient doing well. Denies chest pain. Has started walking two miles daily with no problems.       Essential hypertension    Well controlled. Denies dizziness. Patient has started walking 2 miles daily. Will notify office if he develops dizziness.         Other   Hyperlipidemia    LDL 01/2023 56. Continue same medications.         Disposition:   Return in about 4 months (around 08/15/2023).    Total time spent: 30 minutes  Signed,  Adrian Blackwater, MD  04/14/2023 2:35 PM    [facility]

## 2023-04-14 NOTE — Assessment & Plan Note (Signed)
LDL 01/2023 56. Continue same medications.

## 2023-05-20 ENCOUNTER — Other Ambulatory Visit: Payer: Self-pay | Admitting: Cardiovascular Disease

## 2023-05-20 DIAGNOSIS — R600 Localized edema: Secondary | ICD-10-CM

## 2023-06-13 ENCOUNTER — Encounter: Payer: Self-pay | Admitting: Nurse Practitioner

## 2023-06-13 ENCOUNTER — Other Ambulatory Visit: Payer: Self-pay | Admitting: Nurse Practitioner

## 2023-06-13 MED ORDER — MONTELUKAST SODIUM 10 MG PO TABS: 10.0000 mg | ORAL_TABLET | Freq: Every day | ORAL | 4 refills | Status: DC

## 2023-06-13 NOTE — Telephone Encounter (Signed)
Requested Prescriptions  Refused Prescriptions Disp Refills   montelukast (SINGULAIR) 10 MG tablet [Pharmacy Med Name: MONTELUKAST 10MG  TABLETS] 90 tablet 4    Sig: TAKE 1 TABLET(10 MG) BY MOUTH AT BEDTIME     Pulmonology:  Leukotriene Inhibitors Passed - 06/13/2023  3:29 AM      Passed - Valid encounter within last 12 months    Recent Outpatient Visits           4 months ago Ventricular tachycardia (HCC)   [place] [facility] [place], Corrie Dandy T, NP   11 months ago Ventricular tachycardia (HCC)   [place] [facility] [place], Corrie Dandy T, NP   1 year ago Ventricular tachycardia (HCC)   [facility] [place], Corrie Dandy T, NP   1 year ago Ventricular tachycardia [facility])   [place] [facility] [place], Corrie Dandy T, NP   1 year ago Thrombocytopenia [facility])   [place] [facility] Marjie Skiff, NP       Future Appointments             In 2 months Laurier Nancy, MD [facility]

## 2023-06-13 NOTE — Telephone Encounter (Signed)
Patient is asking for refill of Singulair.

## 2023-06-16 DIAGNOSIS — H35373 Puckering of macula, bilateral: Secondary | ICD-10-CM | POA: Diagnosis not present

## 2023-06-16 DIAGNOSIS — G43109 Migraine with aura, not intractable, without status migrainosus: Secondary | ICD-10-CM | POA: Diagnosis not present

## 2023-06-16 DIAGNOSIS — Z961 Presence of intraocular lens: Secondary | ICD-10-CM | POA: Diagnosis not present

## 2023-06-16 DIAGNOSIS — H02834 Dermatochalasis of left upper eyelid: Secondary | ICD-10-CM | POA: Diagnosis not present

## 2023-06-16 DIAGNOSIS — H02831 Dermatochalasis of right upper eyelid: Secondary | ICD-10-CM | POA: Diagnosis not present

## 2023-06-19 ENCOUNTER — Other Ambulatory Visit: Payer: Self-pay | Admitting: Nurse Practitioner

## 2023-06-19 DIAGNOSIS — I2510 Atherosclerotic heart disease of native coronary artery without angina pectoris: Secondary | ICD-10-CM

## 2023-06-19 DIAGNOSIS — E7800 Pure hypercholesterolemia, unspecified: Secondary | ICD-10-CM

## 2023-06-20 NOTE — Telephone Encounter (Signed)
Requested Prescriptions  Pending Prescriptions Disp Refills   atorvastatin (LIPITOR) 80 MG tablet [Pharmacy Med Name: ATORVASTATIN 80MG  TABLETS] 90 tablet 0    Sig: TAKE 1 TABLET(80 MG) BY MOUTH AT BEDTIME     Cardiovascular:  Antilipid - Statins Failed - 06/19/2023  2:22 PM      Failed - Lipid Panel in normal range within the last 12 months    Cholesterol, Total  Date Value Ref Range Status  01/28/2023 135 100 - 199 mg/dL Final   Cholesterol Piccolo, Waived  Date Value Ref Range Status  05/31/2019 136 <200 mg/dL Final    Comment:                            Desirable                <200                         Borderline High      200- 239                         High                     >239    LDL Chol Calc (NIH)  Date Value Ref Range Status  01/28/2023 56 0 - 99 mg/dL Final   HDL  Date Value Ref Range Status  01/28/2023 62 >39 mg/dL Final   Triglycerides  Date Value Ref Range Status  01/28/2023 87 0 - 149 mg/dL Final   Triglycerides Piccolo,Waived  Date Value Ref Range Status  05/31/2019 87 <150 mg/dL Final    Comment:                            Normal                   <150                         Borderline High     150 - 199                         High                200 - 499                         Very High                >499          Passed - Patient is not pregnant      Passed - Valid encounter within last 12 months    Recent Outpatient Visits           4 months ago Ventricular tachycardia (HCC)   [place] [facility] [place], Corrie Dandy T, NP   11 months ago Ventricular tachycardia (HCC)   [place] [facility] [employer], Corrie Dandy T, NP   1 year ago Ventricular tachycardia (HCC)   [place] [facility] [place], Corrie Dandy T, NP   1 year ago Ventricular tachycardia [facility])   [place] [facility] [place], Corrie Dandy T, NP   1 year ago Thrombocytopenia [facility])   [place] [facility] [place]  Marjie Skiff, NP       Future Appointments             In 1 month Laurier Nancy, MD [facility]

## 2023-06-22 ENCOUNTER — Other Ambulatory Visit: Payer: Self-pay | Admitting: Cardiovascular Disease

## 2023-08-14 ENCOUNTER — Encounter: Payer: Self-pay | Admitting: Cardiovascular Disease

## 2023-08-14 ENCOUNTER — Ambulatory Visit: Payer: Medicare PPO | Admitting: Cardiovascular Disease

## 2023-08-14 VITALS — BP 130/67 | HR 55 | Ht 71.0 in | Wt 196.6 lb

## 2023-08-14 DIAGNOSIS — E7800 Pure hypercholesterolemia, unspecified: Secondary | ICD-10-CM

## 2023-08-14 DIAGNOSIS — I10 Essential (primary) hypertension: Secondary | ICD-10-CM | POA: Diagnosis not present

## 2023-08-14 DIAGNOSIS — I2583 Coronary atherosclerosis due to lipid rich plaque: Secondary | ICD-10-CM | POA: Diagnosis not present

## 2023-08-14 DIAGNOSIS — R001 Bradycardia, unspecified: Secondary | ICD-10-CM

## 2023-08-14 DIAGNOSIS — I2510 Atherosclerotic heart disease of native coronary artery without angina pectoris: Secondary | ICD-10-CM

## 2023-08-14 DIAGNOSIS — I472 Ventricular tachycardia, unspecified: Secondary | ICD-10-CM | POA: Diagnosis not present

## 2023-08-14 MED ORDER — METOPROLOL SUCCINATE ER 25 MG PO TB24
25.0000 mg | ORAL_TABLET | Freq: Every day | ORAL | 11 refills | Status: DC
Start: 2023-08-14 — End: 2024-07-15

## 2023-08-14 NOTE — Progress Notes (Signed)
Cardiology Office Note   Date:  08/14/2023   ID:  Shawn Decant Sr., DOB Jun 05, 1946, MRN [id number]  PCP:  Marjie Skiff, NP  Cardiologist:  Adrian Blackwater, MD      History of Present Illness: Shawn Decant Sr. is a 77 y.o. male who presents for  Chief Complaint  Patient presents with   Follow-up    HR low, no dizziness, but feels exhausted after playing golf, tired next day ok.      Past Medical History:  Diagnosis Date   Arthritis    Basal cell carcinoma 1999   basal cell/skin   CAD (coronary artery disease)    Cataract Cataract surgery 2017   GERD (gastroesophageal reflux disease)    History of bone marrow biopsy    History of kidney stones 2013   passed stone on his own   Hyperlipidemia    Hypertension    Leukopenia    Shingles    Ventricular tachycardia [facility])      Past Surgical History:  Procedure Laterality Date   CARDIAC CATHETERIZATION N/A 01/16/2016   Procedure: Left Heart Cath and Coronary Angiography;  Surgeon: Laurier Nancy, MD;  Location: [facility] INVASIVE CV LAB;  Service: Cardiovascular;  Laterality: N/A;   CATARACT EXTRACTION Left 2017   COLONOSCOPY WITH PROPOFOL N/A 03/27/2020   Procedure: COLONOSCOPY WITH PROPOFOL;  Surgeon: Midge Minium, MD;  Location: [facility]ERY [facility];  Service: Endoscopy;  Laterality: N/A;  priority 4   CORONARY ANGIOPLASTY  2000 and 2003   ESOPHAGOGASTRODUODENOSCOPY (EGD) WITH PROPOFOL N/A 04/01/2022   Procedure: ESOPHAGOGASTRODUODENOSCOPY (EGD) WITH PROPOFOL;  Surgeon: Wyline Mood, MD;  Location: [facility] ENDOSCOPY;  Service: Gastroenterology;  Laterality: N/A;   EYE SURGERY  2017   Cateract   EYE SURGERY Left 04/09/2022   GANGLION CYST EXCISION  1979   HAND SURGERY Right 02/08/2021   blood clot removal per patient   KNEE ARTHROSCOPY WITH MEDIAL MENISECTOMY Left 12/01/2017   Procedure: KNEE ARTHROSCOPY WITH MEDIAL MENISECTOMY;  Surgeon: Lyndle Herrlich, MD;  Location: [facility] ORS;  Service: Orthopedics;   Laterality: Left;  Partial   LEFT HEART CATH Right 08/07/2017   Procedure: Left Heart Cath;  Surgeon: Laurier Nancy, MD;  Location: [facility] INVASIVE CV LAB;  Service: Cardiovascular;  Laterality: Right;   PILONIDAL CYST EXCISION  1977   POLYPECTOMY  03/27/2020   Procedure: POLYPECTOMY;  Surgeon: Midge Minium, MD;  Location: [facility];  Service: Endoscopy;;   SKIN CANCER EXCISION  1999   ear   SYNOVECTOMY Left 12/01/2017   Procedure: SYNOVECTOMY;  Surgeon: Lyndle Herrlich, MD;  Location: [facility] ORS;  Service: Orthopedics;  Laterality: Left;  Partial   V TACH ABLATION N/A 08/14/2017   Procedure: V Tach Ablation;  Surgeon: Regan Lemming, MD;  Location: [facility] INVASIVE CV LAB;  Service: Cardiovascular;  Laterality: N/A;   VEIN SURGERY  2000   laser     Current Outpatient Medications  Medication Sig Dispense Refill   metoprolol succinate (TOPROL XL) 25 MG 24 hr tablet Take 1 tablet (25 mg total) by mouth daily. 30 tablet 11   acetaminophen (TYLENOL) 500 MG tablet Take 1,000 mg by mouth every 6 (six) hours as needed for moderate pain or headache.     ALLERGY RELIEF 180 MG tablet TAKE 1 TABLET BY MOUTH EVERY DAY 90 tablet 1   aspirin EC 81 MG tablet Take 81 mg by mouth daily.     atorvastatin (LIPITOR) 80 MG tablet TAKE  1 TABLET(80 MG) BY MOUTH AT BEDTIME 90 tablet 0   Cholecalciferol (VITAMIN D3) 50 MCG (2000 UT) CHEW Chew 1 tablet by mouth daily.     flecainide (TAMBOCOR) 100 MG tablet Take 2 tablets (200 mg total) as needed by mouth. As needed for recurrent tachycardia.  Do not repeat more than once per day or 3x per wk 2 tablet 3   furosemide (LASIX) 20 MG tablet TAKE 1 TABLET BY MOUTH EVERY DAY 90 tablet 0   montelukast (SINGULAIR) 10 MG tablet Take 1 tablet (10 mg total) by mouth at bedtime. 90 tablet 4   niacin (NIASPAN) 1000 MG CR tablet TAKE 1 TABLET(1000 MG) BY MOUTH AT BEDTIME 90 tablet 4   pantoprazole (PROTONIX) 40 MG tablet TAKE 1 TABLET(40 MG) BY MOUTH DAILY 90 tablet 4    ramipril (ALTACE) 10 MG capsule TAKE 1 CAPSULE BY MOUTH DAILY 90 capsule 0   vitamin B-12 (CYANOCOBALAMIN) 1000 MCG tablet Take 1 tablet (1,000 mcg total) by mouth daily. 90 tablet 0   No current facility-administered medications for this visit.    Allergies:   Egg-derived products and Peanut (diagnostic)    Social History:   reports that he quit smoking about 52 years ago. His smoking use included cigarettes. He started smoking about 57 years ago. He has a 5 pack-year smoking history. He quit smokeless tobacco use about 43 years ago.  His smokeless tobacco use included chew. He reports that he does not currently use alcohol. He reports that he does not use drugs.   Family History:  family history includes Alzheimer's disease in his brother and father; Brain cancer (age of onset: 29) in his daughter; Cancer in his daughter, maternal grandfather, and maternal uncle; Congestive Heart Failure in his brother; Diabetes in his mother; Heart attack in his mother; Heart disease in his brother, mother, and son; Hypertension in his brother and mother; Other in his brother.    ROS:     Review of Systems  Constitutional: Negative.   HENT: Negative.    Eyes: Negative.   Respiratory: Negative.    Gastrointestinal: Negative.   Genitourinary: Negative.   Musculoskeletal: Negative.   Skin: Negative.   Neurological: Negative.   Endo/Heme/Allergies: Negative.   Psychiatric/Behavioral: Negative.    All other systems reviewed and are negative.     All other systems are reviewed and negative.    PHYSICAL EXAM: VS:  BP 130/67   Pulse (!) 55   Ht 5\' 11"  (1.803 m)   Wt 196 lb 9.6 oz (89.2 kg)   SpO2 98%   BMI 27.42 kg/m  , BMI Body mass index is 27.42 kg/m. Last weight:  Wt Readings from Last 3 Encounters:  08/14/23 196 lb 9.6 oz (89.2 kg)  04/14/23 196 lb 12.8 oz (89.3 kg)  03/18/23 198 lb 8 oz (90 kg)     Physical Exam Vitals reviewed.  Constitutional:      Appearance: Normal  appearance. He is normal weight.  HENT:     Head: Normocephalic.     Nose: Nose normal.     Mouth/Throat:     Mouth: Mucous membranes are moist.  Eyes:     Pupils: Pupils are equal, round, and reactive to light.  Cardiovascular:     Rate and Rhythm: Normal rate and regular rhythm.     Pulses: Normal pulses.     Heart sounds: Normal heart sounds.  Pulmonary:     Effort: Pulmonary effort is normal.  Abdominal:  General: Abdomen is flat. Bowel sounds are normal.  Musculoskeletal:        General: Normal range of motion.     Cervical back: Normal range of motion.  Skin:    General: Skin is warm.  Neurological:     General: No focal deficit present.     Mental Status: He is alert.  Psychiatric:        Mood and Affect: Mood normal.       EKG:   Recent Labs: 01/28/2023: Magnesium 1.9; TSH 1.130 03/18/2023: ALT 18; BUN 20; Creatinine, Ser 0.99; Hemoglobin 13.7; Platelets 153; Potassium 4.5; Sodium 134    Lipid Panel    Component Value Date/Time   CHOL 135 01/28/2023 1418   CHOL 136 05/31/2019 0852   TRIG 87 01/28/2023 1418   TRIG 87 05/31/2019 0852   HDL 62 01/28/2023 1418   CHOLHDL 2.2 11/09/2018 1005   VLDL 17 05/31/2019 0852   LDLCALC 56 01/28/2023 1418      Other studies Reviewed: Additional studies/ records that were reviewed today include:  Review of the above records demonstrates:       No data to display            ASSESSMENT AND PLAN:    ICD-10-CM   1. Ventricular tachycardia (HCC)  I47.20 metoprolol succinate (TOPROL XL) 25 MG 24 hr tablet    2. Essential hypertension  I10 metoprolol succinate (TOPROL XL) 25 MG 24 hr tablet    3. Coronary artery disease due to lipid rich plaque  I25.10 metoprolol succinate (TOPROL XL) 25 MG 24 hr tablet   I25.83     4. Bradycardia  R00.1 metoprolol succinate (TOPROL XL) 25 MG 24 hr tablet   Decrease metoprolol to 25    5. Pure hypercholesterolemia  E78.00 metoprolol succinate (TOPROL XL) 25 MG 24 hr  tablet       Problem List Items Addressed This Visit       Cardiovascular and Mediastinum   CAD (coronary artery disease)   Relevant Medications   metoprolol succinate (TOPROL XL) 25 MG 24 hr tablet   Essential hypertension   Relevant Medications   metoprolol succinate (TOPROL XL) 25 MG 24 hr tablet   Ventricular tachycardia (HCC) - Primary   Relevant Medications   metoprolol succinate (TOPROL XL) 25 MG 24 hr tablet     Other   Bradycardia (Chronic)   Relevant Medications   metoprolol succinate (TOPROL XL) 25 MG 24 hr tablet   Hyperlipidemia   Relevant Medications   metoprolol succinate (TOPROL XL) 25 MG 24 hr tablet       Disposition:   Return in about 3 months (around 11/13/2023).    Total time spent: 30 minutes  Signed,  Adrian Blackwater, MD  08/14/2023 10:56 AM    [facility]

## 2023-08-15 ENCOUNTER — Other Ambulatory Visit: Payer: Self-pay | Admitting: Cardiovascular Disease

## 2023-08-15 DIAGNOSIS — R600 Localized edema: Secondary | ICD-10-CM

## 2023-08-19 ENCOUNTER — Other Ambulatory Visit: Payer: Self-pay | Admitting: Cardiovascular Disease

## 2023-08-25 DIAGNOSIS — L818 Other specified disorders of pigmentation: Secondary | ICD-10-CM | POA: Diagnosis not present

## 2023-08-25 DIAGNOSIS — D1801 Hemangioma of skin and subcutaneous tissue: Secondary | ICD-10-CM | POA: Diagnosis not present

## 2023-08-25 DIAGNOSIS — L814 Other melanin hyperpigmentation: Secondary | ICD-10-CM | POA: Diagnosis not present

## 2023-08-25 DIAGNOSIS — R600 Localized edema: Secondary | ICD-10-CM | POA: Diagnosis not present

## 2023-08-25 DIAGNOSIS — L570 Actinic keratosis: Secondary | ICD-10-CM | POA: Diagnosis not present

## 2023-08-25 DIAGNOSIS — C44612 Basal cell carcinoma of skin of right upper limb, including shoulder: Secondary | ICD-10-CM | POA: Diagnosis not present

## 2023-08-25 DIAGNOSIS — L821 Other seborrheic keratosis: Secondary | ICD-10-CM | POA: Diagnosis not present

## 2023-08-25 DIAGNOSIS — D485 Neoplasm of uncertain behavior of skin: Secondary | ICD-10-CM | POA: Diagnosis not present

## 2023-08-25 DIAGNOSIS — Z08 Encounter for follow-up examination after completed treatment for malignant neoplasm: Secondary | ICD-10-CM | POA: Diagnosis not present

## 2023-09-15 DIAGNOSIS — C44612 Basal cell carcinoma of skin of right upper limb, including shoulder: Secondary | ICD-10-CM | POA: Diagnosis not present

## 2023-09-16 ENCOUNTER — Other Ambulatory Visit: Payer: Self-pay | Admitting: Nurse Practitioner

## 2023-09-16 DIAGNOSIS — I2510 Atherosclerotic heart disease of native coronary artery without angina pectoris: Secondary | ICD-10-CM

## 2023-09-16 DIAGNOSIS — E7800 Pure hypercholesterolemia, unspecified: Secondary | ICD-10-CM

## 2023-09-17 NOTE — Telephone Encounter (Signed)
Requested Prescriptions  Pending Prescriptions Disp Refills   atorvastatin (LIPITOR) 80 MG tablet [Pharmacy Med Name: ATORVASTATIN 80MG  TABLETS] 90 tablet 0    Sig: TAKE 1 TABLET(80 MG) BY MOUTH AT BEDTIME     Cardiovascular:  Antilipid - Statins Failed - 09/16/2023  1:22 PM      Failed - Lipid Panel in normal range within the last 12 months    Cholesterol, Total  Date Value Ref Range Status  01/28/2023 135 100 - 199 mg/dL Final   Cholesterol Piccolo, Waived  Date Value Ref Range Status  05/31/2019 136 <200 mg/dL Final    Comment:                            Desirable                <200                         Borderline High      200- 239                         High                     >239    LDL Chol Calc (NIH)  Date Value Ref Range Status  01/28/2023 56 0 - 99 mg/dL Final   HDL  Date Value Ref Range Status  01/28/2023 62 >39 mg/dL Final   Triglycerides  Date Value Ref Range Status  01/28/2023 87 0 - 149 mg/dL Final   Triglycerides Piccolo,Waived  Date Value Ref Range Status  05/31/2019 87 <150 mg/dL Final    Comment:                            Normal                   <150                         Borderline High     150 - 199                         High                200 - 499                         Very High                >499          Passed - Patient is not pregnant      Passed - Valid encounter within last 12 months    Recent Outpatient Visits           7 months ago Ventricular tachycardia (HCC)   [place] [facility] [place], Corrie Dandy T, NP   1 year ago Ventricular tachycardia (HCC)   [place] [facility] [place], Corrie Dandy T, NP   1 year ago Ventricular tachycardia (HCC)   [place] [facility] [place], Corrie Dandy T, NP   1 year ago Ventricular tachycardia [facility])   [place] [facility] [place], Corrie Dandy T, NP   1 year ago Thrombocytopenia [facility])   [place] [facility]  Marjie Skiff, NP       Future Appointments             In 1 month Laurier Nancy, MD [facility]

## 2023-09-18 ENCOUNTER — Inpatient Hospital Stay (HOSPITAL_BASED_OUTPATIENT_CLINIC_OR_DEPARTMENT_OTHER): Payer: Medicare PPO | Admitting: Oncology

## 2023-09-18 ENCOUNTER — Encounter: Payer: Self-pay | Admitting: Oncology

## 2023-09-18 ENCOUNTER — Inpatient Hospital Stay: Payer: Medicare PPO | Attending: Oncology

## 2023-09-18 VITALS — BP 135/77 | HR 67 | Temp 96.7°F | Wt 197.0 lb

## 2023-09-18 DIAGNOSIS — Z87891 Personal history of nicotine dependence: Secondary | ICD-10-CM | POA: Insufficient documentation

## 2023-09-18 DIAGNOSIS — D72819 Decreased white blood cell count, unspecified: Secondary | ICD-10-CM | POA: Insufficient documentation

## 2023-09-18 DIAGNOSIS — D708 Other neutropenia: Secondary | ICD-10-CM

## 2023-09-18 DIAGNOSIS — D72820 Lymphocytosis (symptomatic): Secondary | ICD-10-CM

## 2023-09-18 DIAGNOSIS — D696 Thrombocytopenia, unspecified: Secondary | ICD-10-CM | POA: Diagnosis not present

## 2023-09-18 DIAGNOSIS — Z79899 Other long term (current) drug therapy: Secondary | ICD-10-CM | POA: Insufficient documentation

## 2023-09-18 DIAGNOSIS — Z7982 Long term (current) use of aspirin: Secondary | ICD-10-CM | POA: Insufficient documentation

## 2023-09-18 LAB — CMP (CANCER CENTER ONLY)
ALT: 20 U/L (ref 0–44)
AST: 27 U/L (ref 15–41)
Albumin: 3.8 g/dL (ref 3.5–5.0)
Alkaline Phosphatase: 60 U/L (ref 38–126)
Anion gap: 3 — ABNORMAL LOW (ref 5–15)
BUN: 11 mg/dL (ref 8–23)
CO2: 27 mmol/L (ref 22–32)
Calcium: 9.9 mg/dL (ref 8.9–10.3)
Chloride: 105 mmol/L (ref 98–111)
Creatinine: 0.79 mg/dL (ref 0.61–1.24)
GFR, Estimated: >60 mL/min (ref >60)
Glucose, Bld: 73 mg/dL (ref 70–99)
Potassium: 4.3 mmol/L (ref 3.5–5.1)
Sodium: 135 mmol/L (ref 135–145)
Total Bilirubin: 1.1 mg/dL (ref 0.3–1.2)
Total Protein: 6.6 g/dL (ref 6.5–8.1)

## 2023-09-18 LAB — CBC WITH DIFFERENTIAL (CANCER CENTER ONLY)
Abs Immature Granulocytes: 0.01 10*3/uL (ref 0.00–0.07)
Basophils Absolute: 0 10*3/uL (ref 0.0–0.1)
Basophils Relative: 1 %
Eosinophils Absolute: 0.2 10*3/uL (ref 0.0–0.5)
Eosinophils Relative: 8 %
HCT: 38.8 % — ABNORMAL LOW (ref 39.0–52.0)
Hemoglobin: 12.9 g/dL — ABNORMAL LOW (ref 13.0–17.0)
Immature Granulocytes: 0 %
Lymphocytes Relative: 27 %
Lymphs Abs: 0.7 10*3/uL (ref 0.7–4.0)
MCH: 31.7 pg (ref 26.0–34.0)
MCHC: 33.2 g/dL (ref 30.0–36.0)
MCV: 95.3 fL (ref 80.0–100.0)
Monocytes Absolute: 0.5 10*3/uL (ref 0.1–1.0)
Monocytes Relative: 18 %
Neutro Abs: 1.2 10*3/uL — ABNORMAL LOW (ref 1.7–7.7)
Neutrophils Relative %: 46 %
Platelet Count: 146 10*3/uL — ABNORMAL LOW (ref 150–400)
RBC: 4.07 MIL/uL — ABNORMAL LOW (ref 4.22–5.81)
RDW: 13.6 % (ref 11.5–15.5)
WBC Count: 2.6 10*3/uL — ABNORMAL LOW (ref 4.0–10.5)
nRBC: 0 % (ref 0.0–0.2)

## 2023-09-18 LAB — LACTATE DEHYDROGENASE: LDH: 168 U/L (ref 98–192)

## 2023-09-18 NOTE — Assessment & Plan Note (Signed)
Chronic leukopenia, predominantly neutropenia Previous work-up -including bone marrow biopsy were nonremarkable. 10/26/2019 bone marrow biopsy showed no significant dyspoiesis or increase in blast cells.  No significant lymphoid aggregates.  Normal cytogenetics. Labs reviewed and discussed with patient ANC is stable.  Continue observation.

## 2023-09-18 NOTE — Assessment & Plan Note (Signed)
very mild decrease of platelet count.  No intervention needed.  Continue observation.

## 2023-09-18 NOTE — Assessment & Plan Note (Signed)
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  No constitutional symptoms.   Recommend observation.

## 2023-09-18 NOTE — Progress Notes (Signed)
Hematology/Oncology Progress note [phone] [phone]      CHIEF COMPLAINTS/REASON FOR VISIT:  Follow-up for management of leukopenia and thrombocytopenia,   ASSESSMENT & PLAN:   Monoclonal B-cell lymphocytosis of undetermined significance Previous peripheral blood flow cytometry showed 2 minute CD5/CD23 positive monoclonal B-cell population, CLL/SLL phenotype.   No constitutional symptoms.   Recommend observation.  Leukopenia Chronic leukopenia, predominantly neutropenia Previous work-up -including bone marrow biopsy were nonremarkable. 10/26/2019 bone marrow biopsy showed no significant dyspoiesis or increase in blast cells.  No significant lymphoid aggregates.  Normal cytogenetics. Labs reviewed and discussed with patient ANC is stable.  Continue observation.  Thrombocytopenia (HCC)  very mild decrease of platelet count.  No intervention needed.  Continue observation.    Orders Placed This Encounter  Procedures   CMP (Cancer Center only)    Standing Status:   Future    Standing Expiration Date:   09/17/2024   CBC with Differential (Cancer Center Only)    Standing Status:   Future    Standing Expiration Date:   09/17/2024   Lactate dehydrogenase    Standing Status:   Future    Standing Expiration Date:   09/17/2024   Vitamin B12    Standing Status:   Future    Standing Expiration Date:   09/17/2024   Flow cytometry panel-leukemia/lymphoma work-up    Standing Status:   Future    Standing Expiration Date:   09/17/2024   Folate    Standing Status:   Future    Standing Expiration Date:   09/17/2024  Follow-up 6 months   all questions were answered. The patient knows to call the clinic with any problems, questions or concerns.  Rickard Patience, MD, PhD [facility] Hematology Oncology 09/18/2023     HISTORY OF PRESENTING ILLNESS:  Shawn TIGGS Sr. is a  77 y.o.  male with PMH listed below who was referred to me for evaluation of  leukopenia. Reviewed patient's recent labs. Patient has low total WBC count was 2.6, predominantly absolute lymphocyte 0.6. Earlier lab lab done 11/2019 showed WBC 2.8, lymphocyte 0.7, neutrophil 1.4.  Previous lab records reviewed. 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 No aggravating or improving factors.  Associated symptoms:  denies fatigue, weight loss, fever, chills, frequent infection.  History hepatitis or HIV infection: Denies History of chronic liver disease: Denies History of blood transfusion: Denies Alcohol consumption: Denies Diet Vegetarian or Vegan: Denies Herbal medication: Denies  Reports feeling quite tired and fatigued recently.  He and his wife recently downsized to a one-story home and he has been moving a lot of furniture's by himself.  He feels it took a week for his knee to recover after the most. Denies any unintentional weight loss, fever or chills, night sweating. He is very active, walks every day. Previous occupation was a Radiation protection practitioner.  Drinks a cocktail every day.  Usually drinks cocktail with quinine tonic water for lower extremity cramps.  He started to do this after his ventricular tachycardic episodes about 1-1/2 years ago. Former smoker, quitting in 1972.   chronic back pain and had a lumbar spine x-ray done on 12/20/2020 which showed multilevel moderate lumbar degenerative disc disease, spine spondylolisthesis, facet arthropathy  INTERVAL HISTORY Shawn Kindred Bortle Sr. is a 77 y.o. male who has above history reviewed by me today presents for follow up visit for management of leukopenia, monoclonal lymphocytosis of unknown significance No new complaints.  Denies weight loss, fever, chills, fatigue, night  sweats.    Review of Systems  Constitutional:  Positive for malaise/fatigue. Negative for chills, fever and weight loss.  HENT:  Negative for sore throat.   Eyes:  Negative for redness.  Respiratory:   Negative for cough, shortness of breath and wheezing.   Cardiovascular:  Negative for chest pain, palpitations and leg swelling.  Gastrointestinal:  Negative for abdominal pain, blood in stool, nausea and vomiting.  Genitourinary:  Negative for dysuria.  Musculoskeletal:  Negative for myalgias.  Skin:  Negative for rash.  Neurological:  Negative for dizziness, tingling and tremors.  Endo/Heme/Allergies:  Does not bruise/bleed easily.  Psychiatric/Behavioral:  Negative for hallucinations.     MEDICAL HISTORY:  Past Medical History:  Diagnosis Date   Arthritis    Basal cell carcinoma 1999   basal cell/skin   CAD (coronary artery disease)    Cataract Cataract surgery 2017   GERD (gastroesophageal reflux disease)    History of bone marrow biopsy    History of kidney stones 2013   passed stone on his own   Hyperlipidemia    Hypertension    Leukopenia    Shingles    Ventricular tachycardia (HCC)     SURGICAL HISTORY: Past Surgical History:  Procedure Laterality Date   CARDIAC CATHETERIZATION N/A 01/16/2016   Procedure: Left Heart Cath and Coronary Angiography;  Surgeon: Laurier Nancy, MD;  Location: [facility] INVASIVE CV LAB;  Service: Cardiovascular;  Laterality: N/A;   CATARACT EXTRACTION Left 2017   COLONOSCOPY WITH PROPOFOL N/A 03/27/2020   Procedure: COLONOSCOPY WITH PROPOFOL;  Surgeon: Midge Minium, MD;  Location: [facility];  Service: Endoscopy;  Laterality: N/A;  priority 4   CORONARY ANGIOPLASTY  2000 and 2003   ESOPHAGOGASTRODUODENOSCOPY (EGD) WITH PROPOFOL N/A 04/01/2022   Procedure: ESOPHAGOGASTRODUODENOSCOPY (EGD) WITH PROPOFOL;  Surgeon: Wyline Mood, MD;  Location: [facility] ENDOSCOPY;  Service: Gastroenterology;  Laterality: N/A;   EYE SURGERY  2017   Cateract   EYE SURGERY Left 04/09/2022   GANGLION CYST EXCISION  1979   HAND SURGERY Right 02/08/2021   blood clot removal per patient   KNEE ARTHROSCOPY WITH MEDIAL MENISECTOMY Left 12/01/2017   Procedure: KNEE  ARTHROSCOPY WITH MEDIAL MENISECTOMY;  Surgeon: Lyndle Herrlich, MD;  Location: [facility] ORS;  Service: Orthopedics;  Laterality: Left;  Partial   LEFT HEART CATH Right 08/07/2017   Procedure: Left Heart Cath;  Surgeon: Laurier Nancy, MD;  Location: [facility] INVASIVE CV LAB;  Service: Cardiovascular;  Laterality: Right;   PILONIDAL CYST EXCISION  1977   POLYPECTOMY  03/27/2020   Procedure: POLYPECTOMY;  Surgeon: Midge Minium, MD;  [facility];  Service: Endoscopy;;   SKIN CANCER EXCISION  1999   ear   SYNOVECTOMY Left 12/01/2017   Procedure: SYNOVECTOMY;  Surgeon: Lyndle Herrlich, MD;  Location: [facility] ORS;  Service: Orthopedics;  Laterality: Left;  Partial   V TACH ABLATION N/A 08/14/2017   Procedure: V Tach Ablation;  Surgeon: Regan Lemming, MD;  Location: [facility] INVASIVE CV LAB;  Service: Cardiovascular;  Laterality: N/A;   VEIN SURGERY  2000   laser    SOCIAL HISTORY: Social History   Socioeconomic History   Marital status: Married    Spouse name: Not on file   Number of children: 2   Years of education: Not on file   Highest education level: Master's degree (e.g., MA, MS, MEng, MEd, MSW, MBA)  Occupational History   Occupation: retired  Tobacco Use   Smoking status: Former  Current packs/day: 0.00    Average packs/day: 1 pack/day for 5.0 years (5.0 ttl pk-yrs)    Types: Cigarettes    Start date: 06/20/1966    Quit date: 06/21/1971    Years since quitting: 52.2   Smokeless tobacco: Former    Types: Chew    Quit date: 06/20/1980  Vaping Use   Vaping status: Never Used  Substance and Sexual Activity   Alcohol use: Not Currently    Alcohol/week: 0.0 standard drinks of alcohol   Drug use: No   Sexual activity: Not Currently  Other Topics Concern   Not on file  Social History Narrative   Lives in [place]   Retired school principal   Social Determinants of Health   Financial Resource Strain: Low Risk  (10/28/2022)   Overall Financial Resource Strain  (CARDIA)    Difficulty of Paying Living Expenses: Not hard at all  Food Insecurity: No Food Insecurity (10/28/2022)   Hunger Vital Sign    Worried About Running Out of Food in the Last Year: Never true    Ran Out of Food in the Last Year: Never true  Transportation Needs: No Transportation Needs (10/28/2022)   PRAPARE - Administrator, Civil Service (Medical): No    Lack of Transportation (Non-Medical): No  Physical Activity: Insufficiently Active (10/28/2022)   Exercise Vital Sign    Days of Exercise per Week: 3 days    Minutes of Exercise per Session: 30 min  Stress: No Stress Concern Present (10/28/2022)   [employer] of Occupational Health - Occupational Stress Questionnaire    Feeling of Stress : Not at all  Social Connections: Socially Integrated (10/28/2022)   Social Connection and Isolation Panel [NHANES]    Frequency of Communication with Friends and Family: More than three times a week    Frequency of Social Gatherings with Friends and Family: Once a week    Attends Religious Services: More than 4 times per year    Active Member of [employer] or Organizations: Yes    Attends Engineer, structural: More than 4 times per year    Marital Status: Married  Catering manager Violence: Not At Risk (10/28/2022)   Humiliation, Afraid, Rape, and Kick questionnaire    Fear of Current or Ex-Partner: No    Emotionally Abused: No    Physically Abused: No    Sexually Abused: No    FAMILY HISTORY: Family History  Problem Relation Age of Onset   Hypertension Mother    Heart disease Mother    Heart attack Mother    Diabetes Mother    Alzheimer's disease Father    Alzheimer's disease Brother    Congestive Heart Failure Brother    Hypertension Brother    Other Brother        dementia   Heart disease Brother    Brain cancer Daughter 38   Cancer Daughter    Heart disease Son    Cancer Maternal Uncle    Cancer Maternal Grandfather     ALLERGIES:  is  allergic to egg-derived products and peanut (diagnostic).  MEDICATIONS:  Current Outpatient Medications  Medication Sig Dispense Refill   acetaminophen (TYLENOL) 500 MG tablet Take 1,000 mg by mouth every 6 (six) hours as needed for moderate pain or headache.     ALLERGY RELIEF 180 MG tablet TAKE 1 TABLET BY MOUTH EVERY DAY 90 tablet 1   aspirin EC 81 MG tablet Take 81 mg by mouth daily.  atorvastatin (LIPITOR) 80 MG tablet TAKE 1 TABLET(80 MG) BY MOUTH AT BEDTIME 90 tablet 0   Cholecalciferol (VITAMIN D3) 50 MCG (2000 UT) CHEW Chew 1 tablet by mouth daily.     flecainide (TAMBOCOR) 100 MG tablet Take 2 tablets (200 mg total) as needed by mouth. As needed for recurrent tachycardia.  Do not repeat more than once per day or 3x per wk 2 tablet 3   furosemide (LASIX) 20 MG tablet TAKE 1 TABLET BY MOUTH EVERY DAY 90 tablet 0   metoprolol succinate (TOPROL XL) 25 MG 24 hr tablet Take 1 tablet (25 mg total) by mouth daily. 30 tablet 11   montelukast (SINGULAIR) 10 MG tablet Take 1 tablet (10 mg total) by mouth at bedtime. 90 tablet 4   niacin (NIASPAN) 1000 MG CR tablet TAKE 1 TABLET(1000 MG) BY MOUTH AT BEDTIME 90 tablet 4   pantoprazole (PROTONIX) 40 MG tablet TAKE 1 TABLET(40 MG) BY MOUTH DAILY 90 tablet 4   ramipril (ALTACE) 10 MG capsule TAKE 1 CAPSULE BY MOUTH DAILY 90 capsule 0   vitamin B-12 (CYANOCOBALAMIN) 1000 MCG tablet Take 1 tablet (1,000 mcg total) by mouth daily. 90 tablet 0   metoprolol succinate (TOPROL-XL) 50 MG 24 hr tablet TAKE 1 TABLET BY MOUTH EVERY DAY 90 tablet 4   No current facility-administered medications for this visit.     PHYSICAL EXAMINATION: ECOG PERFORMANCE STATUS: 0 - Asymptomatic Vitals:   09/18/23 1012  BP: 135/77  Pulse: 67  Temp: (!) 96.7 F (35.9 C)  SpO2: 98%   Filed Weights   09/18/23 1012  Weight: 197 lb (89.4 kg)    Physical Exam Constitutional:      General: He is not in acute distress. HENT:     Head: Normocephalic and atraumatic.   Eyes:     General: No scleral icterus.    Pupils: Pupils are equal, round, and reactive to light.  Cardiovascular:     Rate and Rhythm: Normal rate and regular rhythm.     Heart sounds: Normal heart sounds.  Pulmonary:     Effort: Pulmonary effort is normal. No respiratory distress.     Breath sounds: No wheezing.  Abdominal:     General: Bowel sounds are normal. There is no distension.     Palpations: Abdomen is soft. There is no mass.     Tenderness: There is no abdominal tenderness.  Musculoskeletal:        General: No deformity. Normal range of motion.     Cervical back: Normal range of motion and neck supple.  Skin:    General: Skin is warm and dry.     Findings: No erythema or rash.  Neurological:     Mental Status: He is alert and oriented to person, place, and time.     Cranial Nerves: No cranial nerve deficit.     Coordination: Coordination normal.  Psychiatric:        Behavior: Behavior normal.        Thought Content: Thought content normal.      LABORATORY DATA:  I have reviewed the data as listed    Latest Ref Rng & Units 09/18/2023   10:01 AM 03/18/2023    9:27 AM 01/28/2023    2:18 PM  CBC  WBC 4.0 - 10.5 K/uL 2.6  2.8  3.0   Hemoglobin 13.0 - 17.0 g/dL 16.1  09.6  04.5   Hematocrit 39.0 - 52.0 % 38.8  41.4  42.0   Platelets  150 - 400 K/uL 146  153  169       Latest Ref Rng & Units 09/18/2023   10:02 AM 03/18/2023    9:27 AM 01/28/2023    2:18 PM  CMP  Glucose 70 - 99 mg/dL 73  76  95   BUN 8 - 23 mg/dL 11  20  15    Creatinine 0.61 - 1.24 mg/dL 1.61  0.96  0.45   Sodium 135 - 145 mmol/L 135  134  142   Potassium 3.5 - 5.1 mmol/L 4.3  4.5  4.4   Chloride 98 - 111 mmol/L 105  104  101   CO2 22 - 32 mmol/L 27  26  25    Calcium 8.9 - 10.3 mg/dL 9.9  9.9  40.9   Total Protein 6.5 - 8.1 g/dL 6.6  6.7  6.6   Total Bilirubin 0.3 - 1.2 mg/dL 1.1  0.8  0.8   Alkaline Phos 38 - 126 U/L 60  62  88   AST 15 - 41 U/L 27  23  24    ALT 0 - 44 U/L 20  18  17        Iron/TIBC/Ferritin/ %Sat No results found for: "IRON", "TIBC", "FERRITIN", "IRONPCTSAT"

## 2023-10-06 ENCOUNTER — Other Ambulatory Visit: Payer: Self-pay | Admitting: Cardiovascular Disease

## 2023-11-04 ENCOUNTER — Ambulatory Visit: Payer: Medicare PPO | Admitting: Emergency Medicine

## 2023-11-04 VITALS — Ht 71.0 in | Wt 188.0 lb

## 2023-11-04 DIAGNOSIS — Z0000 Encounter for general adult medical examination without abnormal findings: Secondary | ICD-10-CM | POA: Diagnosis not present

## 2023-11-04 NOTE — Progress Notes (Signed)
Subjective:   Shawn Decant Sr. is a 77 y.o. male who presents for Medicare Annual/Subsequent preventive examination.  Visit Complete: Virtual I connected with  Shawn Kindred Rapley Sr. on 11/04/23 by a audio enabled telemedicine application and verified that I am speaking with the correct person using two identifiers.  Patient Location: Home  Provider Location: Home Office  I discussed the limitations of evaluation and management by telemedicine. The patient expressed understanding and agreed to proceed.  Vital Signs: Because this visit was a virtual/telehealth visit, some criteria may be missing or patient reported. Any vitals not documented were not able to be obtained and vitals that have been documented are patient reported.  Patient Medicare AWV questionnaire was completed by the patient on 10/28/23; I have confirmed that all information answered by patient is correct and no changes since this date.  Cardiac Risk Factors include: advanced age (>16men, >64 women);male gender;hypertension;dyslipidemia;Other (see comment), Risk factor comments: CAD     Objective:    Today's Vitals   11/04/23 1325  Weight: 188 lb (85.3 kg)  Height: 5\' 11"  (1.803 m)   Body mass index is 26.22 kg/m.     11/04/2023    1:35 PM 09/18/2023   10:09 AM 03/18/2023    9:19 AM 10/28/2022    1:09 PM 09/20/2022    9:58 AM 04/01/2022    9:39 AM 03/21/2022   10:01 AM  Advanced Directives  Does Patient Have a Medical Advance Directive? Yes Yes Yes Yes Yes No Yes  Type of Estate agent of [place];Living will  Healthcare Power of [place];Living will Healthcare Power of Attorney Living will;Healthcare Power of Attorney  Living will;Healthcare Power of Attorney  Does patient want to make changes to medical advance directive? No - Patient declined    Yes (ED - Information included in AVS)    Copy of Healthcare Power of Attorney in Chart? No - copy requested   Yes - validated most recent  copy scanned in chart (See row information)   No - copy requested  Would patient like information on creating a medical advance directive?     Yes (ED - Information included in AVS) No - Patient declined     Current Medications (verified) Outpatient Encounter Medications as of 11/04/2023  Medication Sig   acetaminophen (TYLENOL) 500 MG tablet Take 1,000 mg by mouth every 6 (six) hours as needed for moderate pain or headache.   ALLERGY RELIEF 180 MG tablet TAKE 1 TABLET BY MOUTH EVERY DAY   aspirin EC 81 MG tablet Take 81 mg by mouth daily.   atorvastatin (LIPITOR) 80 MG tablet TAKE 1 TABLET(80 MG) BY MOUTH AT BEDTIME   Cholecalciferol (VITAMIN D3) 50 MCG (2000 UT) CHEW Chew 1 tablet by mouth daily.   flecainide (TAMBOCOR) 100 MG tablet Take 2 tablets (200 mg total) as needed by mouth. As needed for recurrent tachycardia.  Do not repeat more than once per day or 3x per wk   furosemide (LASIX) 20 MG tablet TAKE 1 TABLET BY MOUTH EVERY DAY   metoprolol succinate (TOPROL XL) 25 MG 24 hr tablet Take 1 tablet (25 mg total) by mouth daily.   niacin (NIASPAN) 1000 MG CR tablet TAKE 1 TABLET(1000 MG) BY MOUTH AT BEDTIME   pantoprazole (PROTONIX) 40 MG tablet TAKE 1 TABLET(40 MG) BY MOUTH DAILY   ramipril (ALTACE) 10 MG capsule TAKE 1 CAPSULE BY MOUTH DAILY   vitamin B-12 (CYANOCOBALAMIN) 1000 MCG tablet Take 1 tablet (1,000 mcg  total) by mouth daily.   montelukast (SINGULAIR) 10 MG tablet Take 1 tablet (10 mg total) by mouth at bedtime. (Patient not taking: Reported on 11/04/2023)   [DISCONTINUED] metoprolol succinate (TOPROL-XL) 50 MG 24 hr tablet TAKE 1 TABLET BY MOUTH EVERY DAY   No facility-administered encounter medications on file as of 11/04/2023.    Allergies (verified) Egg-derived products and Peanut (diagnostic)   History: Past Medical History:  Diagnosis Date   Arthritis    Basal cell carcinoma 1999   basal cell/skin   CAD (coronary artery disease)    Cataract Cataract surgery 2017    GERD (gastroesophageal reflux disease)    History of bone marrow biopsy    History of kidney stones 2013   passed stone on his own   Hyperlipidemia    Hypertension    Leukopenia    Shingles    Ventricular tachycardia [facility])    Past Surgical History:  Procedure Laterality Date   CARDIAC CATHETERIZATION N/A 01/16/2016   Procedure: Left Heart Cath and Coronary Angiography;  Surgeon: Laurier Nancy, MD;  Location: [facility] INVASIVE CV LAB;  Service: Cardiovascular;  Laterality: N/A;   CATARACT EXTRACTION Left 2017   COLONOSCOPY WITH PROPOFOL N/A 03/27/2020   Procedure: COLONOSCOPY WITH PROPOFOL;  Surgeon: Midge Minium, MD;  Location: [facility];  Service: Endoscopy;  Laterality: N/A;  priority 4   CORONARY ANGIOPLASTY  2000 and 2003   ESOPHAGOGASTRODUODENOSCOPY (EGD) WITH PROPOFOL N/A 04/01/2022   Procedure: ESOPHAGOGASTRODUODENOSCOPY (EGD) WITH PROPOFOL;  Surgeon: Wyline Mood, MD;  Location: [facility] ENDOSCOPY;  Service: Gastroenterology;  Laterality: N/A;   EYE SURGERY  2017   Cateract   EYE SURGERY Left 04/09/2022   GANGLION CYST EXCISION  1979   HAND SURGERY Right 02/08/2021   blood clot removal per patient   KNEE ARTHROSCOPY WITH MEDIAL MENISECTOMY Left 12/01/2017   Procedure: KNEE ARTHROSCOPY WITH MEDIAL MENISECTOMY;  Surgeon: Lyndle Herrlich, MD;  Location: [facility] ORS;  Service: Orthopedics;  Laterality: Left;  Partial   LEFT HEART CATH Right 08/07/2017   Procedure: Left Heart Cath;  Surgeon: Laurier Nancy, MD;  Location: [facility] INVASIVE CV LAB;  Service: Cardiovascular;  Laterality: Right;   PILONIDAL CYST EXCISION  1977   POLYPECTOMY  03/27/2020   Procedure: POLYPECTOMY;  Surgeon: Midge Minium, MD;  Location: [facility];  Service: Endoscopy;;   SKIN CANCER EXCISION  1999   ear   SYNOVECTOMY Left 12/01/2017   Procedure: SYNOVECTOMY;  Surgeon: Lyndle Herrlich, MD;  Location: [facility] ORS;  Service: Orthopedics;  Laterality: Left;  Partial   V TACH ABLATION N/A 08/14/2017    Procedure: V Tach Ablation;  Surgeon: Regan Lemming, MD;  Location: [facility] INVASIVE CV LAB;  Service: Cardiovascular;  Laterality: N/A;   VEIN SURGERY  2000   laser   Family History  Problem Relation Age of Onset   Hypertension Mother    Heart disease Mother    Heart attack Mother    Diabetes Mother    Alzheimer's disease Father    Alzheimer's disease Brother    Congestive Heart Failure Brother    Hypertension Brother    Other Brother        dementia   Heart disease Brother    Brain cancer Daughter 19   Cancer Daughter    Heart disease Son    Cancer Maternal Uncle    Cancer Maternal Grandfather    Social History   Socioeconomic History   Marital status: Married    Spouse  name: Magda Paganini   Number of children: 2   Years of education: Not on file   Highest education level: Master's degree (e.g., MA, MS, MEng, MEd, MSW, MBA)  Occupational History   Occupation: retired  Tobacco Use   Smoking status: Former    Current packs/day: 0.00    Average packs/day: 1 pack/day for 5.0 years (5.0 ttl pk-yrs)    Types: Cigarettes    Start date: 06/20/1966    Quit date: 06/21/1971    Years since quitting: 52.4   Smokeless tobacco: Former    Types: Chew    Quit date: 06/20/1980  Vaping Use   Vaping status: Never Used  Substance and Sexual Activity   Alcohol use: Yes    Comment: rare glass of wine, monthly or less   Drug use: No   Sexual activity: Not Currently  Other Topics Concern   Not on file  Social History Narrative   Lives in [place]   Retired school principal   Social Determinants of Health   Financial Resource Strain: Low Risk  (10/28/2023)   Overall Financial Resource Strain (CARDIA)    Difficulty of Paying Living Expenses: Not hard at all  Food Insecurity: No Food Insecurity (10/28/2023)   Hunger Vital Sign    Worried About Running Out of Food in the Last Year: Never true    Ran Out of Food in the Last Year: Never true  Transportation Needs: No Transportation  Needs (10/28/2023)   PRAPARE - Administrator, Civil Service (Medical): No    Lack of Transportation (Non-Medical): No  Physical Activity: Insufficiently Active (10/28/2023)   Exercise Vital Sign    Days of Exercise per Week: 3 days    Minutes of Exercise per Session: 30 min  Stress: No Stress Concern Present (10/28/2023)   [employer] of Occupational Health - Occupational Stress Questionnaire    Feeling of Stress : Not at all  Social Connections: Socially Integrated (10/28/2023)   Social Connection and Isolation Panel [NHANES]    Frequency of Communication with Friends and Family: More than three times a week    Frequency of Social Gatherings with Friends and Family: More than three times a week    Attends Religious Services: More than 4 times per year    Active Member of [employer] or Organizations: Yes    Attends Engineer, structural: More than 4 times per year    Marital Status: Married    Tobacco Counseling Counseling given: Not Answered   Clinical Intake:  Pre-visit preparation completed: Yes  Pain : No/denies pain     BMI - recorded: 26.22 Nutritional Status: BMI 25 -29 Overweight Nutritional Risks: None Diabetes: No  How often do you need to have someone help you when you read instructions, pamphlets, or other written materials from your doctor or pharmacy?: 1 - Never  Interpreter Needed?: No  Information entered by :: Tora Kindred, CMA   Activities of Daily Living    10/28/2023    8:21 PM 01/28/2023    5:09 PM  In your present state of health, do you have any difficulty performing the following activities:  Hearing? 0 0  Vision? 0 0  Difficulty concentrating or making decisions? 0 0  Walking or climbing stairs? 0 0  Dressing or bathing? 0 0  Doing errands, shopping? 0 0  Preparing Food and eating ? N   Using the Toilet? N   In the past six months, have you accidently leaked urine? N  Do you have problems with loss of bowel  control? N   Managing your Medications? N   Managing your Finances? N   Housekeeping or managing your Housekeeping? N     Patient Care Team: Marjie Skiff, NP as PCP - General (Nurse Practitioner) Laurier Nancy, MD as Consulting Physician (Cardiology) Rickard Patience, MD as Consulting Physician (Oncology)  Indicate any recent Medical Services you may have received from other than [facility] providers in the past year (date may be approximate).     Assessment:   This is a routine wellness examination for Shawn Shepard.  Hearing/Vision screen Hearing Screening - Comments:: Denies hearing loss Vision Screening - Comments:: Gets eye exams   Goals Addressed               This Visit's Progress     Patient Stated (pt-stated)        Would like to increase walking from 3 days per week to 7 days per week      Depression Screen    11/04/2023    1:32 PM 01/28/2023    1:42 PM 10/28/2022    1:15 PM 07/02/2022    8:24 AM 05/02/2022    9:54 AM 03/25/2022    3:14 PM 01/01/2022    3:37 PM  PHQ 2/9 Scores  PHQ - 2 Score 0 0 0 0 0 0 0  PHQ- 9 Score 0 1 0 0 0 0 0    Fall Risk    10/28/2023    8:21 PM 01/28/2023    1:42 PM 10/28/2022    1:08 PM 10/24/2022   11:25 AM 07/02/2022    8:24 AM  Fall Risk   Falls in the past year? 0 0 0 0 0  Number falls in past yr: 0 0 0  0  Injury with Fall? 0 0 0  0  Risk for fall due to : No Fall Risks No Fall Risks   No Fall Risks  Follow up Falls prevention discussed Falls evaluation completed Falls evaluation completed;Education provided;Falls prevention discussed  Falls evaluation completed    MEDICARE RISK AT HOME: Medicare Risk at Home Any stairs in or around the home?: Yes (2 steps) If so, are there any without handrails?: Yes Home free of loose throw rugs in walkways, pet beds, electrical cords, etc?: Yes Adequate lighting in your home to reduce risk of falls?: Yes Life alert?: No Use of a cane, walker or w/c?: No Grab bars in the bathroom?: No Shower  chair or bench in shower?: No Elevated toilet seat or a handicapped toilet?: No  TIMED UP AND GO:  Was the test performed?  No    Cognitive Function:        11/04/2023    1:38 PM 10/28/2022    1:11 PM 10/09/2020   10:35 AM 10/01/2018    1:44 PM 09/11/2017    8:56 AM  6CIT Screen  What Year? 0 points 0 points 0 points 0 points 0 points  What month? 0 points 0 points 0 points 0 points 0 points  What time? 0 points 0 points 0 points 0 points 0 points  Count back from 20 0 points 0 points 0 points 0 points 0 points  Months in reverse 0 points 0 points 0 points 0 points 0 points  Repeat phrase 0 points 0 points 0 points 0 points 0 points  Total Score 0 points 0 points 0 points 0 points 0 points    Immunizations Immunization History  Administered Date(s) Administered   Fluad Quad(high Dose 65+) 09/12/2021, 10/09/2022   Influenza, High Dose Seasonal PF 08/26/2017, 08/05/2019   Influenza-Unspecified 09/27/2015, 08/21/2016, 08/26/2017, 09/04/2018, 08/16/2020   Moderna Sars-Covid-2 Vaccination 01/07/2020, 02/05/2020, 08/07/2020, 01/30/2021   Pneumococcal Conjugate-13 12/08/2014   Pneumococcal Polysaccharide-23 09/10/2016   Td 12/21/2007, 12/16/2019   Zoster, Live 12/22/2008    TDAP status: Up to date  Flu Vaccine status: Up to date  Pneumococcal vaccine status: Up to date  Covid-19 vaccine status: Completed vaccines  Qualifies for Shingles Vaccine? Yes   Zostavax completed Yes   Shingrix Completed?: No.    Education has been provided regarding the importance of this vaccine. Patient has been advised to call insurance company to determine out of pocket expense if they have not yet received this vaccine. Advised may also receive vaccine at local pharmacy or Health Dept. Verbalized acceptance and understanding. Patient declined  Screening Tests Health Maintenance  Topic Date Due   Zoster Vaccines- Shingrix (1 of 2) 11/08/1965   INFLUENZA VACCINE  07/03/2023   COVID-19  Vaccine (5 - 2023-24 season) 08/03/2023   Medicare Annual Wellness (AWV)  11/03/2024   DEXA SCAN  08/07/2027   DTaP/Tdap/Td (3 - Tdap) 12/15/2029   Colonoscopy  03/27/2030   Pneumonia Vaccine 76+ Years old  Completed   Hepatitis C Screening  Completed   HPV VACCINES  Aged Out    Health Maintenance  Health Maintenance Due  Topic Date Due   Zoster Vaccines- Shingrix (1 of 2) 11[birth year]08/1966   INFLUENZA VACCINE  07/03/2023   COVID-19 Vaccine (5 - 2023-24 season) 08/03/2023    Colorectal cancer screening: No longer required.   Lung Cancer Screening: (Low Dose CT Chest recommended if Age 8-80 years, 20 pack-year currently smoking OR have quit w/in 15years.) does not qualify.   Lung Cancer Screening Referral: n/a  Additional Screening:  Hepatitis C Screening: does not qualify; Completed 12/22/18  Vision Screening: Recommended annual ophthalmology exams for early detection of glaucoma and other disorders of the eye.  Dental Screening: Recommended annual dental exams for proper oral hygiene   Community Resource Referral / Chronic Care Management: CRR required this visit?  No   CCM required this visit?  No     Plan:     I have personally reviewed and noted the following in the patient's chart:   Medical and social history Use of alcohol, tobacco or illicit drugs  Current medications and supplements including opioid prescriptions. Patient is not currently taking opioid prescriptions. Functional ability and status Nutritional status Physical activity Advanced directives List of other physicians Hospitalizations, surgeries, and ER visits in previous 12 months Vitals Screenings to include cognitive, depression, and falls Referrals and appointments  In addition, I have reviewed and discussed with patient certain preventive protocols, quality metrics, and best practice recommendations. A written personalized care plan for preventive services as well as general preventive health  recommendations were provided to patient.     Tora Kindred, CMA   11/04/2023   After Visit Summary: (MyChart) Due to this being a telephonic visit, the after visit summary with patients personalized plan was offered to patient via MyChart   Nurse Notes:  Declined Shingrix vaccine Schedule CPE for 02/03/24 @ 1:00pm

## 2023-11-04 NOTE — Patient Instructions (Addendum)
Shawn Shepard , Thank you for taking time to come for your Medicare Wellness Visit. I appreciate your ongoing commitment to your health goals. Please review the following plan we discussed and let me know if I can assist you in the future.   Referrals/Orders/Follow-Ups/Clinician Recommendations: I have scheduled you for a physical on 02/03/24 @ 1:00pm with Aura Dials, NP.  This is a list of the screening recommended for you and due dates:  Health Maintenance  Topic Date Due   Zoster (Shingles) Vaccine (1 of 2) 11[birth year]08[birth year]1966   COVID-19 Vaccine (5 - 2023-24 season) 08/03/2023   Medicare Annual Wellness Visit  11/03/2024   DEXA scan (bone density measurement)  08/07/2027   DTaP/Tdap/Td vaccine (3 - Tdap) 12/15/2029   Colon Cancer Screening  03/27/2030   Pneumonia Vaccine  Completed   Flu Shot  Completed   Hepatitis C Screening  Completed   HPV Vaccine  Aged Out    Advanced directives: (Copy Requested) Please bring a copy of your health care power of attorney and living will to the office to be added to your chart at your convenience.  Next Medicare Annual Wellness Visit scheduled for next year: Yes, 11/09/24 @ 12:30pm

## 2023-11-12 ENCOUNTER — Other Ambulatory Visit: Payer: Self-pay | Admitting: Cardiovascular Disease

## 2023-11-12 DIAGNOSIS — R600 Localized edema: Secondary | ICD-10-CM

## 2023-11-13 ENCOUNTER — Ambulatory Visit: Payer: Medicare PPO | Admitting: Cardiovascular Disease

## 2023-12-09 ENCOUNTER — Ambulatory Visit: Payer: Medicare PPO | Admitting: Cardiovascular Disease

## 2023-12-12 ENCOUNTER — Other Ambulatory Visit: Payer: Self-pay | Admitting: Nurse Practitioner

## 2023-12-12 ENCOUNTER — Encounter: Payer: Self-pay | Admitting: Cardiovascular Disease

## 2023-12-12 ENCOUNTER — Ambulatory Visit: Payer: Medicare PPO | Admitting: Cardiovascular Disease

## 2023-12-12 VITALS — BP 122/79 | HR 65 | Ht 71.0 in | Wt 196.4 lb

## 2023-12-12 DIAGNOSIS — I2583 Coronary atherosclerosis due to lipid rich plaque: Secondary | ICD-10-CM

## 2023-12-12 DIAGNOSIS — E7800 Pure hypercholesterolemia, unspecified: Secondary | ICD-10-CM

## 2023-12-12 DIAGNOSIS — I2510 Atherosclerotic heart disease of native coronary artery without angina pectoris: Secondary | ICD-10-CM | POA: Diagnosis not present

## 2023-12-12 DIAGNOSIS — I10 Essential (primary) hypertension: Secondary | ICD-10-CM

## 2023-12-12 DIAGNOSIS — R001 Bradycardia, unspecified: Secondary | ICD-10-CM

## 2023-12-12 DIAGNOSIS — I472 Ventricular tachycardia, unspecified: Secondary | ICD-10-CM

## 2023-12-12 MED ORDER — FLECAINIDE ACETATE 100 MG PO TABS: 200.0000 mg | ORAL_TABLET | ORAL | 3 refills | Status: DC | PRN

## 2023-12-12 NOTE — Progress Notes (Signed)
 Cardiology Office Note   Date:  12/12/2023   ID:  Shawn DELENA Floor Sr., DOB Aug 21, 1946, MRN [id number]  PCP:  Valerio Melanie DASEN, NP  Cardiologist:  Denyse Bathe, MD      History of Present Illness: Shawn DELENA Floor Sr. is a 78 y.o. male who presents for  Chief Complaint  Patient presents with   Follow-up    3 month follow up     Fatigue gone with lowering metoprolol .      Past Medical History:  Diagnosis Date   Arthritis    Basal cell carcinoma 1999   basal cell/skin   CAD (coronary artery disease)    Cataract Cataract surgery 2017   GERD (gastroesophageal reflux disease)    History of bone marrow biopsy    History of kidney stones 2013   passed stone on his own   Hyperlipidemia    Hypertension    Leukopenia    Shingles    Ventricular tachycardia [facility])      Past Surgical History:  Procedure Laterality Date   CARDIAC CATHETERIZATION N/A 01/16/2016   Procedure: Left Heart Cath and Coronary Angiography;  Surgeon: Denyse DELENA Bathe, MD;  Location: [facility] INVASIVE CV LAB;  Service: Cardiovascular;  Laterality: N/A;   CATARACT EXTRACTION Left 2017   COLONOSCOPY WITH PROPOFOL  N/A 03/27/2020   Procedure: COLONOSCOPY WITH PROPOFOL ;  Surgeon: Jinny Carmine, MD;  Location: [facility];  Service: Endoscopy;  Laterality: N/A;  priority 4   CORONARY ANGIOPLASTY  2000 and 2003   ESOPHAGOGASTRODUODENOSCOPY (EGD) WITH PROPOFOL  N/A 04/01/2022   Procedure: ESOPHAGOGASTRODUODENOSCOPY (EGD) WITH PROPOFOL ;  Surgeon: Therisa Bi, MD;  Location: [facility] END[facility]COPY;  Service: Gastroenterology;  Laterality: N/A;   EYE SURGERY  2017   Cateract   EYE SURGERY Left 04/09/2022   GANGLION CYST EXCISION  1979   HAND SURGERY Right 02/08/2021   blood clot removal per patient   KNEE ARTHROSCOPY WITH MEDIAL MENISECTOMY Left 12/01/2017   Procedure: KNEE ARTHROSCOPY WITH MEDIAL MENISECTOMY;  Surgeon: Leora Lynwood SAUNDERS, MD;  Location: [facility] ORS;  Service: Orthopedics;  Laterality: Left;  Partial    LEFT HEART CATH Right 08/07/2017   Procedure: Left Heart Cath;  Surgeon: Bathe Denyse DELENA, MD;  Location: [facility] INVASIVE CV LAB;  Service: Cardiovascular;  Laterality: Right;   PILONIDAL CYST EXCISION  1977   POLYPECTOMY  03/27/2020   Procedure: POLYPECTOMY;  Surgeon: Jinny Carmine, MD;  Location: [facility];  Service: Endoscopy;;   SKIN CANCER EXCISION  1999   ear   SYNOVECTOMY Left 12/01/2017   Procedure: SYNOVECTOMY;  Surgeon: Leora Lynwood SAUNDERS, MD;  Location: [facility] ORS;  Service: Orthopedics;  Laterality: Left;  Partial   V TACH ABLATION N/A 08/14/2017   Procedure: V Tach Ablation;  Surgeon: Inocencio Soyla Lunger, MD;  Location: [facility] INVASIVE CV LAB;  Service: Cardiovascular;  Laterality: N/A;   VEIN SURGERY  2000   laser     Current Outpatient Medications  Medication Sig Dispense Refill   acetaminophen  (TYLENOL ) 500 MG tablet Take 1,000 mg by mouth every 6 (six) hours as needed for moderate pain or headache.     ALLERGY  RELIEF 180 MG tablet TAKE 1 TABLET BY MOUTH EVERY DAY 90 tablet 1   aspirin  EC 81 MG tablet Take 81 mg by mouth daily.     atorvastatin  (LIPITOR ) 80 MG tablet TAKE 1 TABLET(80 MG) BY MOUTH AT BEDTIME 90 tablet 0   Cholecalciferol (VITAMIN D3) 50 MCG (2000 UT) CHEW Chew 1 tablet by  mouth daily.     furosemide  (LASIX ) 20 MG tablet TAKE 1 TABLET BY MOUTH EVERY DAY 90 tablet 0   metoprolol  succinate (TOPROL  XL) 25 MG 24 hr tablet Take 1 tablet (25 mg total) by mouth daily. 30 tablet 11   montelukast  (SINGULAIR ) 10 MG tablet Take 1 tablet (10 mg total) by mouth at bedtime. 90 tablet 4   niacin  (NIASPAN ) 1000 MG CR tablet TAKE 1 TABLET(1000 MG) BY MOUTH AT BEDTIME 90 tablet 4   pantoprazole  (PROTONIX ) 40 MG tablet TAKE 1 TABLET(40 MG) BY MOUTH DAILY 90 tablet 4   ramipril  (ALTACE ) 10 MG capsule TAKE 1 CAPSULE BY MOUTH DAILY 90 capsule 0   vitamin B-12 (CYANOCOBALAMIN ) 1000 MCG tablet Take 1 tablet (1,000 mcg total) by mouth daily. 90 tablet 0   flecainide  (TAMBOCOR )  100 MG tablet Take 2 tablets (200 mg total) by mouth as needed. As needed for recurrent tachycardia.  Do not repeat more than once per day or 3x per wk 2 tablet 3   No current facility-administered medications for this visit.    Allergies:   Egg-derived products and Peanut (diagnostic)    Social History:   reports that he quit smoking about 52 years ago. His smoking use included cigarettes. He started smoking about 57 years ago. He has a 5 pack-year smoking history. He quit smokeless tobacco use about 43 years ago.  His smokeless tobacco use included chew. He reports current alcohol use. He reports that he does not use drugs.   Family History:  family history includes Alzheimer's disease in his brother and father; Brain cancer (age of onset: 58) in his daughter; Cancer in his daughter, maternal grandfather, and maternal uncle; Congestive Heart Failure in his brother; Diabetes in his mother; Heart attack in his mother; Heart disease in his brother, mother, and son; Hypertension in his brother and mother; Other in his brother.    ROS:     Review of Systems  Constitutional: Negative.   HENT: Negative.    Eyes: Negative.   Respiratory: Negative.    Gastrointestinal: Negative.   Genitourinary: Negative.   Musculoskeletal: Negative.   Skin: Negative.   Neurological: Negative.   Endo/Heme/Allergies: Negative.   Psychiatric/Behavioral: Negative.    All other systems reviewed and are negative.     All other systems are reviewed and negative.    PHYSICAL EXAM: VS:  BP 122/79   Pulse 65   Ht 5' 11 (1.803 m)   Wt 196 lb 6.4 oz (89.1 kg)   SpO2 100%   BMI 27.39 kg/m  , BMI Body mass index is 27.39 kg/m. Last weight:  Wt Readings from Last 3 Encounters:  12/12/23 196 lb 6.4 oz (89.1 kg)  11/04/23 188 lb (85.3 kg)  09/18/23 197 lb (89.4 kg)     Physical Exam Vitals reviewed.  Constitutional:      Appearance: Normal appearance. He is normal weight.  HENT:     Head:  Normocephalic.     Nose: Nose normal.     Mouth/Throat:     Mouth: Mucous membranes are moist.  Eyes:     Pupils: Pupils are equal, round, and reactive to light.  Cardiovascular:     Rate and Rhythm: Normal rate and regular rhythm.     Pulses: Normal pulses.     Heart sounds: Normal heart sounds.  Pulmonary:     Effort: Pulmonary effort is normal.  Abdominal:     General: Abdomen is flat. Bowel sounds are normal.  Musculoskeletal:        General: Normal range of motion.     Cervical back: Normal range of motion.  Skin:    General: Skin is warm.  Neurological:     General: No focal deficit present.     Mental Status: He is alert.  Psychiatric:        Mood and Affect: Mood normal.       EKG:   Recent Labs: 01/28/2023: Magnesium  1.9; TSH 1.130 09/18/2023: ALT 20; BUN 11; Creatinine 0.79; Hemoglobin 12.9; Platelet Count 146; Potassium 4.3; Sodium 135    Lipid Panel    Component Value Date/Time   CHOL 135 01/28/2023 1418   CHOL 136 05/31/2019 0852   TRIG 87 01/28/2023 1418   TRIG 87 05/31/2019 0852   HDL 62 01/28/2023 1418   CHOLHDL 2.2 11/09/2018 1005   VLDL 17 05/31/2019 0852   LDLCALC 56 01/28/2023 1418      Other studies Reviewed: Additional studies/ records that were reviewed today include:  Review of the above records demonstrates:       No data to display            ASSESSMENT AND PLAN:    ICD-10-CM   1. Ventricular tachycardia (HCC)  I47.20 flecainide  (TAMBOCOR ) 100 MG tablet   Will refill flecainide  200 mg which is needed as a as needed basis for palpitation as may be expired.    2. Essential hypertension  I10 flecainide  (TAMBOCOR ) 100 MG tablet   Blood pressure appears to be very stable.    3. Coronary artery disease due to lipid rich plaque  I25.10 flecainide  (TAMBOCOR ) 100 MG tablet   I25.83     4. Bradycardia  R00.1 flecainide  (TAMBOCOR ) 100 MG tablet   Lowering of the metoprolol  helped with getting rid of fatigue.  Patient is feeling  much better.    5. Pure hypercholesterolemia  E78.00 flecainide  (TAMBOCOR ) 100 MG tablet       Problem List Items Addressed This Visit       Cardiovascular and Mediastinum   CAD (coronary artery disease)   Relevant Medications   flecainide  (TAMBOCOR ) 100 MG tablet   Essential hypertension   Relevant Medications   flecainide  (TAMBOCOR ) 100 MG tablet   Ventricular tachycardia (HCC) - Primary   Relevant Medications   flecainide  (TAMBOCOR ) 100 MG tablet     Other   Bradycardia (Chronic)   Relevant Medications   flecainide  (TAMBOCOR ) 100 MG tablet   Hyperlipidemia   Relevant Medications   flecainide  (TAMBOCOR ) 100 MG tablet       Disposition:   Return in about 3 months (around 03/11/2024).    Total time spent: 30 minutes  Signed,  Denyse Bathe, MD  12/12/2023 11:15 AM    [facility]

## 2023-12-15 NOTE — Telephone Encounter (Signed)
 Requested Prescriptions  Pending Prescriptions Disp Refills   atorvastatin  (LIPITOR ) 80 MG tablet [Pharmacy Med Name: ATORVASTATIN  80MG  TABLETS] 90 tablet 0    Sig: TAKE 1 TABLET(80 MG) BY MOUTH AT BEDTIME     Cardiovascular:  Antilipid - Statins Failed - 12/15/2023  3:58 PM      Failed - Lipid Panel in normal range within the last 12 months    Cholesterol, Total  Date Value Ref Range Status  01/28/2023 135 100 - 199 mg/dL Final   Cholesterol Piccolo, Waived  Date Value Ref Range Status  05/31/2019 136 <200 mg/dL Final    Comment:                            Desirable                <200                         Borderline High      200- 239                         High                     >239    LDL Chol Calc (NIH)  Date Value Ref Range Status  01/28/2023 56 0 - 99 mg/dL Final   HDL  Date Value Ref Range Status  01/28/2023 62 >39 mg/dL Final   Triglycerides  Date Value Ref Range Status  01/28/2023 87 0 - 149 mg/dL Final   Triglycerides Piccolo,Waived  Date Value Ref Range Status  05/31/2019 87 <150 mg/dL Final    Comment:                            Normal                   <150                         Borderline High     150 - 199                         High                200 - 499                         Very High                >499          Passed - Patient is not pregnant      Passed - Valid encounter within last 12 months    Recent Outpatient Visits           10 months ago Ventricular tachycardia (HCC)   [place] [facility] [place], Melanie T, NP   1 year ago Ventricular tachycardia (HCC)   [place] [facility] [place], Melanie T, NP   1 year ago Ventricular tachycardia (HCC)   [place] [facility] [place], Melanie T, NP   1 year ago Ventricular tachycardia [facility])   [place] [facility] [place], Melanie T, NP   1 year ago Thrombocytopenia [facility])   [place] [facility] [place]  Valerio Melanie DASEN, NP       Future Appointments             In 1 month [place], Melanie DASEN, NP [place] [facility]   In 3 months Fernand Denyse LABOR, MD [facility]

## 2023-12-22 DIAGNOSIS — H52223 Regular astigmatism, bilateral: Secondary | ICD-10-CM | POA: Diagnosis not present

## 2023-12-22 DIAGNOSIS — Z961 Presence of intraocular lens: Secondary | ICD-10-CM | POA: Diagnosis not present

## 2023-12-22 DIAGNOSIS — H5203 Hypermetropia, bilateral: Secondary | ICD-10-CM | POA: Diagnosis not present

## 2023-12-22 DIAGNOSIS — H35373 Puckering of macula, bilateral: Secondary | ICD-10-CM | POA: Diagnosis not present

## 2023-12-22 DIAGNOSIS — H02831 Dermatochalasis of right upper eyelid: Secondary | ICD-10-CM | POA: Diagnosis not present

## 2023-12-22 DIAGNOSIS — H02834 Dermatochalasis of left upper eyelid: Secondary | ICD-10-CM | POA: Diagnosis not present

## 2023-12-22 DIAGNOSIS — H524 Presbyopia: Secondary | ICD-10-CM | POA: Diagnosis not present

## 2023-12-22 DIAGNOSIS — G43109 Migraine with aura, not intractable, without status migrainosus: Secondary | ICD-10-CM | POA: Diagnosis not present

## 2024-01-02 ENCOUNTER — Other Ambulatory Visit: Payer: Self-pay | Admitting: Cardiovascular Disease

## 2024-02-01 NOTE — Patient Instructions (Signed)
 Be Involved in Caring For Your Health:  Taking Medications When medications are taken as directed, they can greatly improve your health. But if they are not taken as prescribed, they may not work. In some cases, not taking them correctly can be harmful. To help ensure your treatment remains effective and safe, understand your medications and how to take them. Bring your medications to each visit for review by your provider.  Your lab results, notes, and after visit summary will be available on My Chart. We strongly encourage you to use this feature. If lab results are abnormal the clinic will contact you with the appropriate steps. If the clinic does not contact you assume the results are satisfactory. You can always view your results on My Chart. If you have questions regarding your health or results, please contact the clinic during office hours. You can also ask questions on My Chart.  We at [facility] are grateful that you chose [place] to provide your care. We strive to provide evidence-based and compassionate care and are always looking for feedback. If you get a survey from the clinic please complete this so we can hear your opinions.  Heart-Healthy Eating Plan Many factors influence your heart health, including eating and exercise habits. Heart health is also called coronary health. Coronary risk increases with abnormal blood fat (lipid) levels. A heart-healthy eating plan includes limiting unhealthy fats, increasing healthy fats, limiting salt (sodium) intake, and making other diet and lifestyle changes. What is my plan? Your health care provider may recommend that: You limit your fat intake to _________% or less of your total calories each day. You limit your saturated fat intake to _________% or less of your total calories each day. You limit the amount of cholesterol in your diet to less than _________ mg per day. You limit the amount of sodium in your diet to less than _________  mg per day. What are tips for following this plan? Cooking Cook foods using methods other than frying. Baking, boiling, grilling, and broiling are all good options. Other ways to reduce fat include: Removing the skin from poultry. Removing all visible fats from meats. Steaming vegetables in water or broth. Meal planning  At meals, imagine dividing your plate into fourths: Fill one-half of your plate with vegetables and green salads. Fill one-fourth of your plate with whole grains. Fill one-fourth of your plate with lean protein foods. Eat 2-4 cups of vegetables per day. One cup of vegetables equals 1 cup (91 g) broccoli or cauliflower florets, 2 medium carrots, 1 large bell pepper, 1 large sweet potato, 1 large tomato, 1 medium white potato, 2 cups (150 g) raw leafy greens. Eat 1-2 cups of fruit per day. One cup of fruit equals 1 small apple, 1 large banana, 1 cup (237 g) mixed fruit, 1 large orange,  cup (82 g) dried fruit, 1 cup (240 mL) 100% fruit juice. Eat more foods that contain soluble fiber. Examples include apples, broccoli, carrots, beans, peas, and barley. Aim to get 25-30 g of fiber per day. Increase your consumption of legumes, nuts, and seeds to 4-5 servings per week. One serving of dried beans or legumes equals  cup (90 g) cooked, 1 serving of nuts is  oz (12 almonds, 24 pistachios, or 7 walnut halves), and 1 serving of seeds equals  oz (8 g). Fats Choose healthy fats more often. Choose monounsaturated and polyunsaturated fats, such as olive and canola oils, avocado oil, flaxseeds, walnuts, almonds, and seeds. Eat  more omega-3 fats. Choose salmon, mackerel, sardines, tuna, flaxseed oil, and ground flaxseeds. Aim to eat fish at least 2 times each week. Check food labels carefully to identify foods with trans fats or high amounts of saturated fat. Limit saturated fats. These are found in animal products, such as meats, butter, and cream. Plant sources of saturated fats  include palm oil, palm kernel oil, and coconut oil. Avoid foods with partially hydrogenated oils in them. These contain trans fats. Examples are stick margarine, some tub margarines, cookies, crackers, and other baked goods. Avoid fried foods. General information Eat more home-cooked food and less restaurant, buffet, and fast food. Limit or avoid alcohol. Limit foods that are high in added sugar and simple starches such as foods made using white refined flour (white breads, pastries, sweets). Lose weight if you are overweight. Losing just 5-10% of your body weight can help your overall health and prevent diseases such as diabetes and heart disease. Monitor your sodium intake, especially if you have high blood pressure. Talk with your health care provider about your sodium intake. Try to incorporate more vegetarian meals weekly. What foods should I eat? Fruits All fresh, canned (in natural juice), or frozen fruits. Vegetables Fresh or frozen vegetables (raw, steamed, roasted, or grilled). Green salads. Grains Most grains. Choose whole wheat and whole grains most of the time. Rice and pasta, including brown rice and pastas made with whole wheat. Meats and other proteins Lean, well-trimmed beef, veal, pork, and lamb. Chicken and [place] without skin. All fish and shellfish. Wild duck, rabbit, pheasant, and venison. Egg whites or low-cholesterol egg substitutes. Dried beans, peas, lentils, and tofu. Seeds and most nuts. Dairy Low-fat or nonfat cheeses, including ricotta and mozzarella. Skim or 1% milk (liquid, powdered, or evaporated). Buttermilk made with low-fat milk. Nonfat or low-fat yogurt. Fats and oils Non-hydrogenated (trans-free) margarines. Vegetable oils, including soybean, sesame, sunflower, olive, avocado, peanut, safflower, corn, canola, and cottonseed. Salad dressings or mayonnaise made with a vegetable oil. Beverages Water (mineral or sparkling). Coffee and tea. Unsweetened ice  tea. Diet beverages. Sweets and desserts Sherbet, gelatin, and fruit ice. Small amounts of dark chocolate. Limit all sweets and desserts. Seasonings and condiments All seasonings and condiments. The items listed above may not be a complete list of foods and beverages you can eat. Contact a dietitian for more options. What foods should I avoid? Fruits Canned fruit in heavy syrup. Fruit in cream or butter sauce. Fried fruit. Limit coconut. Vegetables Vegetables cooked in cheese, cream, or butter sauce. Fried vegetables. Grains Breads made with saturated or trans fats, oils, or whole milk. Croissants. Sweet rolls. Donuts. High-fat crackers, such as cheese crackers and chips. Meats and other proteins Fatty meats, such as hot dogs, ribs, sausage, bacon, rib-eye roast or steak. High-fat deli meats, such as salami and bologna. Caviar. Domestic duck and goose. Organ meats, such as liver. Dairy Cream, sour cream, cream cheese, and creamed cottage cheese. Whole-milk cheeses. Whole or 2% milk (liquid, evaporated, or condensed). Whole buttermilk. Cream sauce or high-fat cheese sauce. Whole-milk yogurt. Fats and oils Meat fat, or shortening. Cocoa butter, hydrogenated oils, palm oil, coconut oil, palm kernel oil. Solid fats and shortenings, including bacon fat, salt pork, lard, and butter. Nondairy cream substitutes. Salad dressings with cheese or sour cream. Beverages Regular sodas and any drinks with added sugar. Sweets and desserts Frosting. Pudding. Cookies. Cakes. Pies. Milk chocolate or white chocolate. Buttered syrups. Full-fat ice cream or ice cream drinks. The items listed above may  not be a complete list of foods and beverages to avoid. Contact a dietitian for more information. Summary Heart-healthy meal planning includes limiting unhealthy fats, increasing healthy fats, limiting salt (sodium) intake and making other diet and lifestyle changes. Lose weight if you are overweight. Losing just  5-10% of your body weight can help your overall health and prevent diseases such as diabetes and heart disease. Focus on eating a balance of foods, including fruits and vegetables, low-fat or nonfat dairy, lean protein, nuts and legumes, whole grains, and heart-healthy oils and fats. This information is not intended to replace advice given to you by your health care provider. Make sure you discuss any questions you have with your health care provider. Document Revised: 12/24/2021 Document Reviewed: 12/24/2021 Elsevier Patient Education  2024 [employer].

## 2024-02-03 ENCOUNTER — Ambulatory Visit (INDEPENDENT_AMBULATORY_CARE_PROVIDER_SITE_OTHER): Payer: Medicare PPO | Admitting: Nurse Practitioner

## 2024-02-03 ENCOUNTER — Encounter: Payer: Self-pay | Admitting: Nurse Practitioner

## 2024-02-03 VITALS — BP 130/68 | HR 76 | Temp 97.6°F | Ht 72.0 in | Wt 197.6 lb

## 2024-02-03 DIAGNOSIS — I472 Ventricular tachycardia, unspecified: Secondary | ICD-10-CM

## 2024-02-03 DIAGNOSIS — I2510 Atherosclerotic heart disease of native coronary artery without angina pectoris: Secondary | ICD-10-CM

## 2024-02-03 DIAGNOSIS — I10 Essential (primary) hypertension: Secondary | ICD-10-CM | POA: Diagnosis not present

## 2024-02-03 DIAGNOSIS — M810 Age-related osteoporosis without current pathological fracture: Secondary | ICD-10-CM

## 2024-02-03 DIAGNOSIS — R7301 Impaired fasting glucose: Secondary | ICD-10-CM

## 2024-02-03 DIAGNOSIS — N400 Enlarged prostate without lower urinary tract symptoms: Secondary | ICD-10-CM

## 2024-02-03 DIAGNOSIS — E7800 Pure hypercholesterolemia, unspecified: Secondary | ICD-10-CM

## 2024-02-03 DIAGNOSIS — D696 Thrombocytopenia, unspecified: Secondary | ICD-10-CM | POA: Diagnosis not present

## 2024-02-03 DIAGNOSIS — Z0000 Encounter for general adult medical examination without abnormal findings: Secondary | ICD-10-CM | POA: Diagnosis not present

## 2024-02-03 DIAGNOSIS — D708 Other neutropenia: Secondary | ICD-10-CM | POA: Diagnosis not present

## 2024-02-03 DIAGNOSIS — K200 Eosinophilic esophagitis: Secondary | ICD-10-CM

## 2024-02-03 DIAGNOSIS — R001 Bradycardia, unspecified: Secondary | ICD-10-CM

## 2024-02-03 DIAGNOSIS — D72820 Lymphocytosis (symptomatic): Secondary | ICD-10-CM

## 2024-02-03 LAB — MICROALBUMIN, URINE WAIVED
Creatinine, Urine Waived: 50 mg/dL (ref 10–300)
Microalb, Ur Waived: 30 mg/L — ABNORMAL HIGH (ref 0–19)

## 2024-02-03 NOTE — Assessment & Plan Note (Signed)
 No current symptoms. Continue collaboration with cardiology. Follow up in 6 months.

## 2024-02-03 NOTE — Assessment & Plan Note (Signed)
 No current symptoms. Continue current POC. No intervention needed.

## 2024-02-03 NOTE — Assessment & Plan Note (Signed)
 Chronic, ongoing. Ablation in 2018. Continue with current POC and collaboration with cardiology. Follow up in 6 months.

## 2024-02-03 NOTE — Progress Notes (Deleted)
 BP (!) 151/67   Pulse 62   Temp 97.6 F (36.4 C) (Oral)   Ht 6' (1.829 m)   Wt 197 lb 9.6 oz (89.6 kg)   SpO2 98%   BMI 26.80 kg/m    Subjective:    Patient ID: Shawn Decant Sr., male    DOB: Apr 03, 1946, 78 y.o.   MRN: [id number]  HPI: Shawn REHMAN Sr. is a 78 y.o. male presenting on 02/03/2024 for comprehensive medical examination. Current medical complaints include:{Blank single:19197::"none","***"}  He currently lives with: Interim Problems from his last visit: {Blank single:19197::"yes","no"}  HYPERTENSION / HYPERLIPIDEMIA Takes Ramipril, Metoprolol, Tambocor as needed, Niacin, and Atorvastatin.  Follows with cardiology, Dr. Welton Flakes, every 4 months - last visit was 12/12/23.  Had ablation >5 years ago for VT. Has not had to take any Tambocor.     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 Satisfied with current treatment? {Blank single:19197::"yes","no"} Duration of hypertension: {Blank single:19197::"chronic","months","years"} BP monitoring frequency: {Blank single:19197::"not checking","rarely","daily","weekly","monthly","a few times a day","a few times a week","a few times a month"} BP range:  BP medication side effects: {Blank single:19197::"yes","no"} Duration of hyperlipidemia: {Blank single:19197::"chronic","months","years"} Cholesterol medication side effects: {Blank single:19197::"yes","no"} Cholesterol supplements: {Blank multiple:19196::"none","fish oil","niacin","red yeast rice"} Medication compliance: {Blank single:19197::"excellent compliance","good compliance","fair compliance","poor compliance"} Aspirin: {Blank single:19197::"yes","no"} Recent stressors: {Blank single:19197::"yes","no"} Recurrent headaches: {Blank single:19197::"yes","no"} Visual changes: {Blank single:19197::"yes","no"} Palpitations: {Blank single:19197::"yes","no"} Dyspnea: {Blank single:19197::"yes","no"} Chest pain: {Blank  single:19197::"yes","no"} Lower extremity edema: {Blank single:19197::"yes","no"} Dizzy/lightheaded: {Blank single:19197::"yes","no"}   GERD Eosinophilic esophagitis.  Taking Flonase, Singulair, Allegra, Protonix. GERD control status: {Blank single:19197::"controlled","uncontrolled","better","worse","exacerbated","stable"} Satisfied with current treatment? {Blank single:19197::"yes","no"} Heartburn frequency:  Medication side effects: {Blank single:19197::"yes","no"}  Medication compliance: {Blank multiple:19196::"better","worse","stable","fluctuating"} Dysphagia: {Blank single:19197::"yes","no"} Odynophagia:  {Blank single:19197::"yes","no"} Hematemesis: {Blank single:19197::"yes","no"} Blood in stool: {Blank single:19197::"yes","no"} EGD: {Blank single:19197::"yes","no"}   OSTEOPENIA DEXA in 2023 noted T-score -2.6.  Takes daily supplements.  Satisfied with current treatment?: {Blank single:19197::"yes","no"} Adequate calcium & vitamin D: {Blank single:19197::"yes","no"} Weight bearing exercises: {Blank single:19197::"yes","no"}    Functional Status Survey: Is the patient deaf or have difficulty hearing?: No Does the patient have difficulty seeing, even when wearing glasses/contacts?: No Does the patient have difficulty concentrating, remembering, or making decisions?: No Does the patient have difficulty walking or climbing stairs?: No Does the patient have difficulty dressing or bathing?: No Does the patient have difficulty doing errands alone such as visiting a doctor's office or shopping?: No  FALL RISK:    10/28/2023    8:21 PM 01/28/2023    1:42 PM 10/28/2022    1:08 PM 10/24/2022   11:25 AM 07/02/2022    8:24 AM  Fall Risk   Falls in the past year? 0 0 0 0 0  Number falls in past yr: 0 0 0  0  Injury with Fall? 0 0 0  0  Risk for fall due to : No Fall Risks No Fall Risks   No Fall Risks  Follow up Falls prevention discussed Falls evaluation completed Falls evaluation  completed;Education provided;Falls prevention discussed  Falls evaluation completed    Depression Screen    11/04/2023    1:32 PM 01/28/2023    1:42 PM 10/28/2022    1:15 PM 07/02/2022    8:24 AM 05/02/2022    9:54 AM  Depression screen PHQ 2/9  Decreased Interest 0 0 0 0 0  Down, Depressed, Hopeless 0 0 0 0 0  PHQ - 2 Score 0 0 0 0 0  Altered sleeping 0 1 0 0 0  Tired, decreased energy 0 0 0 0 0  Change in appetite 0 0 0 0 0  Feeling bad or failure about yourself  0 0 0 0 0  Trouble concentrating 0 0 0 0 0  Moving slowly or fidgety/restless 0 0 0 0 0  Suicidal thoughts 0 0 0 0 0  PHQ-9 Score 0 1 0 0 0  Difficult doing work/chores Not difficult at all Not difficult at all  Not difficult at all Not difficult at all      01/28/2023    1:43 PM 07/02/2022    8:24 AM 05/02/2022    9:54 AM 03/25/2022    3:14 PM  GAD 7 : Generalized Anxiety Score  Nervous, Anxious, on Edge 0 0 0 0  Control/stop worrying 0 0 0 0  Worry too much - different things 0 0 0 0  Trouble relaxing 0 0 0 0  Restless 0 0 0 0  Easily annoyed or irritable 0 0 0 0  Afraid - awful might happen 0 0 0 0  Total GAD 7 Score 0 0 0 0  Anxiety Difficulty Not difficult at all Not difficult at all Not difficult at all Not difficult at all   Past Medical History:  Past Medical History:  Diagnosis Date   Allergy 1967   Arthritis    Asthma 2022   Eosinophilic Esophagitis   Basal cell carcinoma 1999   basal cell/skin   CAD (coronary artery disease)    Cataract Cataract surgery 2017   GERD (gastroesophageal reflux disease)    History of bone marrow biopsy    History of kidney stones 2013   passed stone on his own   Hyperlipidemia    Hypertension    Leukopenia    Shingles    Ventricular tachycardia (HCC)     Surgical History:  Past Surgical History:  Procedure Laterality Date   CARDIAC CATHETERIZATION N/A 01/16/2016   Procedure: Left Heart Cath and Coronary Angiography;  Surgeon: Laurier Nancy, MD;  Location: [facility]  INVASIVE CV LAB;  Service: Cardiovascular;  Laterality: N/A;   CATARACT EXTRACTION Left 2017   COLONOSCOPY WITH PROPOFOL N/A 03/27/2020   Procedure: COLONOSCOPY WITH PROPOFOL;  Surgeon: Midge Minium, MD;  Location: [facility];  Service: Endoscopy;  Laterality: N/A;  priority 4   CORONARY ANGIOPLASTY  2000 and 2003   ESOPHAGOGASTRODUODENOSCOPY (EGD) WITH PROPOFOL N/A 04/01/2022   Procedure: ESOPHAGOGASTRODUODENOSCOPY (EGD) WITH PROPOFOL;  Surgeon: Wyline Mood, MD;  Location: [facility] ENDOSCOPY;  Service: Gastroenterology;  Laterality: N/A;   EYE SURGERY  2017   Cateract   EYE SURGERY Left 04/09/2022   GANGLION CYST EXCISION  1979   HAND SURGERY Right 02/08/2021   blood clot removal per patient   KNEE ARTHROSCOPY WITH MEDIAL MENISECTOMY Left 12/01/2017   Procedure: KNEE ARTHROSCOPY WITH MEDIAL MENISECTOMY;  Surgeon: Lyndle Herrlich, MD;  Location: [facility] ORS;  Service: Orthopedics;  Laterality: Left;  Partial   LEFT HEART CATH Right 08/07/2017   Procedure: Left Heart Cath;  Surgeon: Laurier Nancy, MD;  Location: [facility] INVASIVE CV LAB;  Service: Cardiovascular;  Laterality: Right;   PILONIDAL CYST EXCISION  1977   POLYPECTOMY  03/27/2020   Procedure: POLYPECTOMY;  Surgeon: Midge Minium, MD;  Location: [facility];  Service: Endoscopy;;   SKIN CANCER EXCISION  1999   ear   SYNOVECTOMY Left 12/01/2017   Procedure: SYNOVECTOMY;  Surgeon: Lyndle Herrlich, MD;  Location: [facility] ORS;  Service: Orthopedics;  Laterality: Left;  Partial  V TACH ABLATION N/A 08/14/2017   Procedure: V Tach Ablation;  Surgeon: Regan Lemming, MD;  Location: [facility] INVASIVE CV LAB;  Service: Cardiovascular;  Laterality: N/A;   VEIN SURGERY  2000   laser    Medications:  Current Outpatient Medications on File Prior to Visit  Medication Sig   acetaminophen (TYLENOL) 500 MG tablet Take 1,000 mg by mouth every 6 (six) hours as needed for moderate pain or headache.   ALLERGY RELIEF 180 MG tablet TAKE 1  TABLET BY MOUTH EVERY DAY   aspirin EC 81 MG tablet Take 81 mg by mouth daily.   atorvastatin (LIPITOR) 80 MG tablet TAKE 1 TABLET(80 MG) BY MOUTH AT BEDTIME   Cholecalciferol (VITAMIN D3) 50 MCG (2000 UT) CHEW Chew 1 tablet by mouth daily.   flecainide (TAMBOCOR) 100 MG tablet Take 2 tablets (200 mg total) by mouth as needed. As needed for recurrent tachycardia.  Do not repeat more than once per day or 3x per wk   furosemide (LASIX) 20 MG tablet TAKE 1 TABLET BY MOUTH EVERY DAY   metoprolol succinate (TOPROL XL) 25 MG 24 hr tablet Take 1 tablet (25 mg total) by mouth daily.   montelukast (SINGULAIR) 10 MG tablet Take 1 tablet (10 mg total) by mouth at bedtime.   niacin (NIASPAN) 1000 MG CR tablet TAKE 1 TABLET(1000 MG) BY MOUTH AT BEDTIME   pantoprazole (PROTONIX) 40 MG tablet TAKE 1 TABLET(40 MG) BY MOUTH DAILY   ramipril (ALTACE) 10 MG capsule TAKE 1 CAPSULE BY MOUTH DAILY   vitamin B-12 (CYANOCOBALAMIN) 1000 MCG tablet Take 1 tablet (1,000 mcg total) by mouth daily.   No current facility-administered medications on file prior to visit.    Allergies:  Allergies  Allergen Reactions   Egg-Derived Products    Peanut (Diagnostic)     Social History:  Social History   Socioeconomic History   Marital status: Married    Spouse name: Magda Paganini   Number of children: 2   Years of education: Not on file   Highest education level: Master's degree (e.g., MA, MS, MEng, MEd, MSW, MBA)  Occupational History   Occupation: retired  Tobacco Use   Smoking status: Former    Current packs/day: 0.00    Average packs/day: 1 pack/day for 5.0 years (5.0 ttl pk-yrs)    Types: Cigarettes    Start date: 06/20/1966    Quit date: 06/21/1971    Years since quitting: 52.6   Smokeless tobacco: Former    Types: Chew    Quit date: 06/20/1980  Vaping Use   Vaping status: Never Used  Substance and Sexual Activity   Alcohol use: Not Currently    Comment: rare glass of wine, monthly or less   Drug use: No    Sexual activity: Not Currently  Other Topics Concern   Not on file  Social History Narrative   Lives in [place]   Retired school principal   Social Drivers of Health   Financial Resource Strain: Low Risk  (01/31/2024)   Overall Financial Resource Strain (CARDIA)    Difficulty of Paying Living Expenses: Not hard at all  Food Insecurity: No Food Insecurity (01/31/2024)   Hunger Vital Sign    Worried About Running Out of Food in the Last Year: Never true    Ran Out of Food in the Last Year: Never true  Transportation Needs: No Transportation Needs (01/31/2024)   PRAPARE - Administrator, Civil Service (Medical): No  Lack of Transportation (Non-Medical): No  Physical Activity: Insufficiently Active (01/31/2024)   Exercise Vital Sign    Days of Exercise per Week: 3 days    Minutes of Exercise per Session: 30 min  Stress: No Stress Concern Present (01/31/2024)   [employer] of Occupational Health - Occupational Stress Questionnaire    Feeling of Stress : Not at all  Social Connections: Socially Integrated (01/31/2024)   Social Connection and Isolation Panel [NHANES]    Frequency of Communication with Friends and Family: Three times a week    Frequency of Social Gatherings with Friends and Family: More than three times a week    Attends Religious Services: More than 4 times per year    Active Member of [employer] or Organizations: Yes    Attends Engineer, structural: More than 4 times per year    Marital Status: Married  Catering manager Violence: Not At Risk (11/04/2023)   Humiliation, Afraid, Rape, and Kick questionnaire    Fear of Current or Ex-Partner: No    Emotionally Abused: No    Physically Abused: No    Sexually Abused: No   Social History   Tobacco Use  Smoking Status Former   Current packs/day: 0.00   Average packs/day: 1 pack/day for 5.0 years (5.0 ttl pk-yrs)   Types: Cigarettes   Start date: 06/20/1966   Quit date: 06/21/1971   Years since  quitting: 52.6  Smokeless Tobacco Former   Types: Chew   Quit date: 06/20/1980   Social History   Substance and Sexual Activity  Alcohol Use Not Currently   Comment: rare glass of wine, monthly or less    Family History:  Family History  Problem Relation Age of Onset   Hypertension Mother    Heart disease Mother    Heart attack Mother    Diabetes Mother    Alzheimer's disease Father    Alzheimer's disease Brother    Congestive Heart Failure Brother    Hypertension Brother    Other Brother        dementia   Heart disease Brother    Brain cancer Daughter 27   Cancer Daughter    Heart disease Son    Cancer Maternal Uncle    Cancer Maternal Grandfather     Past medical history, surgical history, medications, allergies, family history and social history reviewed with patient today and changes made to appropriate areas of the chart.   ROS All other ROS negative except what is listed above and in the HPI.      Objective:    BP (!) 151/67   Pulse 62   Temp 97.6 F (36.4 C) (Oral)   Ht 6' (1.829 m)   Wt 197 lb 9.6 oz (89.6 kg)   SpO2 98%   BMI 26.80 kg/m   Wt Readings from Last 3 Encounters:  02/03/24 197 lb 9.6 oz (89.6 kg)  12/12/23 196 lb 6.4 oz (89.1 kg)  11/04/23 188 lb (85.3 kg)    Physical Exam     11/04/2023    1:38 PM 10/28/2022    1:11 PM 10/09/2020   10:35 AM 10/01/2018    1:44 PM 09/11/2017    8:56 AM  6CIT Screen  What Year? 0 points 0 points 0 points 0 points 0 points  What month? 0 points 0 points 0 points 0 points 0 points  What time? 0 points 0 points 0 points 0 points 0 points  Count back from 20 0 points 0 points  0 points 0 points 0 points  Months in reverse 0 points 0 points 0 points 0 points 0 points  Repeat phrase 0 points 0 points 0 points 0 points 0 points  Total Score 0 points 0 points 0 points 0 points 0 points   Results for orders placed or performed in visit on 09/18/23  Lactate dehydrogenase   Collection Time: 09/18/23 10:01  AM  Result Value Ref Range   LDH 168 98 - 192 U/L  CBC with Differential (Cancer Center Only)   Collection Time: 09/18/23 10:01 AM  Result Value Ref Range   WBC Count 2.6 (L) 4.0 - 10.5 K/uL   RBC 4.07 (L) 4.22 - 5.81 MIL/uL   Hemoglobin 12.9 (L) 13.0 - 17.0 g/dL   HCT 16.1 (L) 09.6 - 04.5 %   MCV 95.3 80.0 - 100.0 fL   MCH 31.7 26.0 - 34.0 pg   MCHC 33.2 30.0 - 36.0 g/dL   RDW 40.9 81.1 - 91.4 %   Platelet Count 146 (L) 150 - 400 K/uL   nRBC 0.0 0.0 - 0.2 %   Neutrophils Relative % 46 %   Neutro Abs 1.2 (L) 1.7 - 7.7 K/uL   Lymphocytes Relative 27 %   Lymphs Abs 0.7 0.7 - 4.0 K/uL   Monocytes Relative 18 %   Monocytes Absolute 0.5 0.1 - 1.0 K/uL   Eosinophils Relative 8 %   Eosinophils Absolute 0.2 0.0 - 0.5 K/uL   Basophils Relative 1 %   Basophils Absolute 0.0 0.0 - 0.1 K/uL   Immature Granulocytes 0 %   Abs Immature Granulocytes 0.01 0.00 - 0.07 K/uL  CMP (Cancer Center only)   Collection Time: 09/18/23 10:02 AM  Result Value Ref Range   Sodium 135 135 - 145 mmol/L   Potassium 4.3 3.5 - 5.1 mmol/L   Chloride 105 98 - 111 mmol/L   CO2 27 22 - 32 mmol/L   Glucose, Bld 73 70 - 99 mg/dL   BUN 11 8 - 23 mg/dL   Creatinine 7.82 9.56 - 1.24 mg/dL   Calcium 9.9 8.9 - 21.3 mg/dL   Total Protein 6.6 6.5 - 8.1 g/dL   Albumin 3.8 3.5 - 5.0 g/dL   AST 27 15 - 41 U/L   ALT 20 0 - 44 U/L   Alkaline Phosphatase 60 38 - 126 U/L   Total Bilirubin 1.1 0.3 - 1.2 mg/dL   GFR, Estimated >08 >65 mL/min   Anion gap 3 (L) 5 - 15      Assessment & Plan:   Problem List Items Addressed This Visit       Cardiovascular and Mediastinum   CAD (coronary artery disease)   Relevant Orders   Comprehensive metabolic panel   Lipid Panel w/o Chol/HDL Ratio   Essential hypertension   Relevant Orders   Microalbumin, Urine Waived   CBC with Differential/Platelet   Comprehensive metabolic panel   TSH   Ventricular tachycardia (HCC) - Primary   Relevant Orders   Comprehensive metabolic  panel   Lipid Panel w/o Chol/HDL Ratio     Digestive   Eosinophilic esophagitis   Relevant Orders   Magnesium     Musculoskeletal and Integument   Osteoporosis of lumbar spine   Relevant Orders   VITAMIN D 25 Hydroxy (Vit-D Deficiency, Fractures)     Hematopoietic and Hemostatic   Thrombocytopenia (HCC)   Relevant Orders   CBC with Differential/Platelet     Other   Bradycardia (Chronic)  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       Discussed aspirin prophylaxis for myocardial infarction prevention and decision was {Blank single:19197::"it was not indicated","made to continue ASA","made to start ASA","made to stop ASA","that we recommended ASA, and patient refused"}  LABORATORY TESTING:  Health maintenance labs ordered today as discussed above.   The natural history of prostate cancer and ongoing controversy regarding screening and potential treatment outcomes of prostate cancer has been discussed with the patient. The meaning of a false positive PSA and a false negative PSA has been discussed. He indicates understanding of the limitations of this screening test and wishes *** to proceed with screening PSA testing.   IMMUNIZATIONS:   - Tdap: Tetanus vaccination status reviewed: {tetanus status:315746}. - Influenza: {Blank single:19197::"Up to date","Administered today","Postponed to flu season","Refused","Given elsewhere"} - Pneumovax: {Blank single:19197::"Up to date","Administered today","Not applicable","Refused","Given elsewhere"} - Prevnar: {Blank single:19197::"Up to date","Administered  today","Not applicable","Refused","Given elsewhere"} - Zostavax vaccine: {Blank single:19197::"Up to date","Administered today","Not applicable","Refused","Given elsewhere"}  SCREENING: - Colonoscopy: {Blank single:19197::"Up to date","Ordered today","Not applicable","Refused","Done elsewhere"}  Discussed with patient purpose of the colonoscopy is to detect colon cancer at curable precancerous or early stages   - AAA Screening: {Blank single:19197::"Up to date","Ordered today","Not applicable","Refused","Done elsewhere"}  -Hearing Test: {Blank single:19197::"Up to date","Ordered today","Not applicable","Refused","Done elsewhere"}  -Spirometry: {Blank single:19197::"Up to date","Ordered today","Not applicable","Refused","Done elsewhere"}   PATIENT COUNSELING:    Sexuality: Discussed sexually transmitted diseases, partner selection, use of condoms, avoidance of unintended pregnancy  and contraceptive alternatives.   Advised to avoid cigarette smoking.  I discussed with the patient that most people either abstain from alcohol or drink within safe limits (<=14/week and <=4 drinks/occasion for males, <=7/weeks and <= 3 drinks/occasion for females) and that the risk for alcohol disorders and other health effects rises proportionally with the number of drinks per week and how often a drinker exceeds daily limits.  Discussed cessation/primary prevention of drug use and availability of treatment for abuse.   Diet: Encouraged to adjust caloric intake to maintain  or achieve ideal body weight, to reduce intake of dietary saturated fat and total fat, to limit sodium intake by avoiding high sodium foods and not adding table salt, and to maintain adequate dietary potassium and calcium preferably from fresh fruits, vegetables, and low-fat dairy products.    Stressed the importance of regular exercise  Injury prevention: Discussed safety belts, safety helmets, smoke detector, smoking near bedding or  upholstery.   Dental health: Discussed importance of regular tooth brushing, flossing, and dental visits.   Follow up plan: NEXT PREVENTATIVE PHYSICAL DUE IN 1 YEAR. No follow-ups on file.

## 2024-02-03 NOTE — Assessment & Plan Note (Signed)
 Chonic, stable. Continue current medication regimen. Labs checked today Lipid panel. Follow up in 6 months.

## 2024-02-03 NOTE — Assessment & Plan Note (Signed)
Chronic, ongoing.  Continue collaboration with hematology, recent note and labs reviewed. 

## 2024-02-03 NOTE — Assessment & Plan Note (Signed)
No current symptoms.  Continue observation.

## 2024-02-03 NOTE — Progress Notes (Signed)
 BP 130/68 (BP Location: Left Arm, Patient Position: Sitting, Cuff Size: Normal)   Pulse 76   Temp 97.6 F (36.4 C) (Oral)   Ht 6' (1.829 m)   Wt 197 lb 9.6 oz (89.6 kg)   SpO2 98%   BMI 26.80 kg/m    Subjective:    Patient ID: Shawn Decant Sr., male    DOB: 1946-07-14, 78 y.o.   MRN: [id number]  NOTE WRITTEN BY DNP STUDENT.  ASSESSMENT AND PLAN OF CARE REVIEWED WITH STUDENT, AGREE WITH ABOVE FINDINGS AND PLAN.  HPI: Shawn PAPROCKI Sr. is a 78 y.o. male presenting on 02/03/2024 for comprehensive medical examination. Current medical complaints include: Wife reports his blood sugar drops. Patient gets dizzy and lightheaded. Plays golf regularly, takes snacks and water with him. Would like to know A1c.   He currently lives with: spouse Interim Problems from his last visit: no  HYPERTENSION / HYPERLIPIDEMIA Takes Ramipril, Metoprolol, Tambocor as needed, Niacin, and Atorvastatin.  Follows with cardiology, Dr. Welton Flakes, every 4 months - last visit was 12/12/23.  Had ablation >5 years ago for VT. Has not had to take any Tambocor.     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 Satisfied with current treatment? yes Duration of hypertension: chronic BP monitoring frequency: rarely  BP medication side effects: no Duration of hyperlipidemia: chronic Cholesterol medication side effects: no Cholesterol supplements: niacin Medication compliance: good compliance Aspirin: yes Recent stressors: no Recurrent headaches: no Visual changes: no Palpitations: no Dyspnea: no Chest pain: no Lower extremity edema: no Dizzy/lightheaded: no   GERD Eosinophilic esophagitis.  Taking Flonase, Singulair, Allegra, Protonix. GERD control status: stable Satisfied with current treatment? yes Medication side effects: no  Medication compliance: stable Dysphagia: no Odynophagia:  no Hematemesis: no Blood in stool: no EGD: no   OSTEOPENIA DEXA in 2023 noted  T-score -2.6.  Takes daily supplements.  Satisfied with current treatment?: yes Adequate calcium & vitamin D: yes Weight bearing exercises: no    Functional Status Survey: Is the patient deaf or have difficulty hearing?: No Does the patient have difficulty seeing, even when wearing glasses/contacts?: No Does the patient have difficulty concentrating, remembering, or making decisions?: No Does the patient have difficulty walking or climbing stairs?: No Does the patient have difficulty dressing or bathing?: No Does the patient have difficulty doing errands alone such as visiting a doctor's office or shopping?: No  FALL RISK:    02/03/2024    1:07 PM 10/28/2023    8:21 PM 01/28/2023    1:42 PM 10/28/2022    1:08 PM 10/24/2022   11:25 AM  Fall Risk   Falls in the past year? 0 0 0 0 0  Number falls in past yr: 0 0 0 0   Injury with Fall? 0 0 0 0   Risk for fall due to : No Fall Risks No Fall Risks No Fall Risks    Follow up Falls evaluation completed Falls prevention discussed Falls evaluation completed Falls evaluation completed;Education provided;Falls prevention discussed     Depression Screen    02/03/2024    1:07 PM 11/04/2023    1:32 PM 01/28/2023    1:42 PM 10/28/2022    1:15 PM 07/02/2022    8:24 AM  Depression screen PHQ 2/9  Decreased Interest 0 0 0 0 0  Down, Depressed, Hopeless 0 0 0 0 0  PHQ - 2 Score 0 0 0 0 0  Altered sleeping 0 0 1 0 0  Tired, decreased energy 0 0 0 0 0  Change in appetite 0 0 0 0 0  Feeling bad or failure about yourself  0 0 0 0 0  Trouble concentrating 0 0 0 0 0  Moving slowly or fidgety/restless 0 0 0 0 0  Suicidal thoughts 0 0 0 0 0  PHQ-9 Score 0 0 1 0 0  Difficult doing work/chores Not difficult at all Not difficult at all Not difficult at all  Not difficult at all      02/03/2024    1:08 PM 01/28/2023    1:43 PM 07/02/2022    8:24 AM 05/02/2022    9:54 AM  GAD 7 : Generalized Anxiety Score  Nervous, Anxious, on Edge 0 0 0 0  Control/stop  worrying 0 0 0 0  Worry too much - different things 0 0 0 0  Trouble relaxing 0 0 0 0  Restless 0 0 0 0  Easily annoyed or irritable 0 0 0 0  Afraid - awful might happen 0 0 0 0  Total GAD 7 Score 0 0 0 0  Anxiety Difficulty Not difficult at all Not difficult at all Not difficult at all Not difficult at all   Past Medical History:  Past Medical History:  Diagnosis Date   Allergy 1967   Arthritis    Asthma 2022   Eosinophilic Esophagitis   Basal cell carcinoma 1999   basal cell/skin   CAD (coronary artery disease)    Cataract Cataract surgery 2017   GERD (gastroesophageal reflux disease)    History of bone marrow biopsy    History of kidney stones 2013   passed stone on his own   Hyperlipidemia    Hypertension    Leukopenia    Shingles    Ventricular tachycardia (HCC)     Surgical History:  Past Surgical History:  Procedure Laterality Date   CARDIAC CATHETERIZATION N/A 01/16/2016   Procedure: Left Heart Cath and Coronary Angiography;  Surgeon: Laurier Nancy, MD;  Location: [facility] INVASIVE CV LAB;  Service: Cardiovascular;  Laterality: N/A;   CATARACT EXTRACTION Left 2017   COLONOSCOPY WITH PROPOFOL N/A 03/27/2020   Procedure: COLONOSCOPY WITH PROPOFOL;  Surgeon: Midge Minium, MD;  Location: [facility];  Service: Endoscopy;  Laterality: N/A;  priority 4   CORONARY ANGIOPLASTY  2000 and 2003   ESOPHAGOGASTRODUODENOSCOPY (EGD) WITH PROPOFOL N/A 04/01/2022   Procedure: ESOPHAGOGASTRODUODENOSCOPY (EGD) WITH PROPOFOL;  Surgeon: Wyline Mood, MD;  Location: [facility] ENDOSCOPY;  Service: Gastroenterology;  Laterality: N/A;   EYE SURGERY  2017   Cateract   EYE SURGERY Left 04/09/2022   GANGLION CYST EXCISION  1979   HAND SURGERY Right 02/08/2021   blood clot removal per patient   KNEE ARTHROSCOPY WITH MEDIAL MENISECTOMY Left 12/01/2017   Procedure: KNEE ARTHROSCOPY WITH MEDIAL MENISECTOMY;  Surgeon: Lyndle Herrlich, MD;  Location: [facility] ORS;  Service: Orthopedics;   Laterality: Left;  Partial   LEFT HEART CATH Right 08/07/2017   Procedure: Left Heart Cath;  Surgeon: Laurier Nancy, MD;  Location: [facility] INVASIVE CV LAB;  Service: Cardiovascular;  Laterality: Right;   PILONIDAL CYST EXCISION  1977   POLYPECTOMY  03/27/2020   Procedure: POLYPECTOMY;  Surgeon: Midge Minium, MD;  Location: [facility];  Service: Endoscopy;;   SKIN CANCER EXCISION  1999   ear   SYNOVECTOMY Left 12/01/2017   Procedure: SYNOVECTOMY;  Surgeon: Lyndle Herrlich, MD;  Location: [facility] ORS;  Service: Orthopedics;  Laterality: Left;  Partial   V TACH ABLATION N/A 08/14/2017   Procedure: V Tach Ablation;  Surgeon: Regan Lemming, MD;  Location: [facility] INVASIVE CV LAB;  Service: Cardiovascular;  Laterality: N/A;   VEIN SURGERY  2000   laser    Medications:  Current Outpatient Medications on File Prior to Visit  Medication Sig   acetaminophen (TYLENOL) 500 MG tablet Take 1,000 mg by mouth every 6 (six) hours as needed for moderate pain or headache.   ALLERGY RELIEF 180 MG tablet TAKE 1 TABLET BY MOUTH EVERY DAY   aspirin EC 81 MG tablet Take 81 mg by mouth daily.   atorvastatin (LIPITOR) 80 MG tablet TAKE 1 TABLET(80 MG) BY MOUTH AT BEDTIME   Cholecalciferol (VITAMIN D3) 50 MCG (2000 UT) CHEW Chew 1 tablet by mouth daily.   flecainide (TAMBOCOR) 100 MG tablet Take 2 tablets (200 mg total) by mouth as needed. As needed for recurrent tachycardia.  Do not repeat more than once per day or 3x per wk   furosemide (LASIX) 20 MG tablet TAKE 1 TABLET BY MOUTH EVERY DAY   metoprolol succinate (TOPROL XL) 25 MG 24 hr tablet Take 1 tablet (25 mg total) by mouth daily.   montelukast (SINGULAIR) 10 MG tablet Take 1 tablet (10 mg total) by mouth at bedtime.   niacin (NIASPAN) 1000 MG CR tablet TAKE 1 TABLET(1000 MG) BY MOUTH AT BEDTIME   pantoprazole (PROTONIX) 40 MG tablet TAKE 1 TABLET(40 MG) BY MOUTH DAILY   ramipril (ALTACE) 10 MG capsule TAKE 1 CAPSULE BY MOUTH DAILY   vitamin B-12  (CYANOCOBALAMIN) 1000 MCG tablet Take 1 tablet (1,000 mcg total) by mouth daily.   No current facility-administered medications on file prior to visit.    Allergies:  Allergies  Allergen Reactions   Egg-Derived Products    Peanut (Diagnostic)     Social History:  Social History   Socioeconomic History   Marital status: Married    Spouse name: Magda Paganini   Number of children: 2   Years of education: Not on file   Highest education level: Master's degree (e.g., MA, MS, MEng, MEd, MSW, MBA)  Occupational History   Occupation: retired  Tobacco Use   Smoking status: Former    Current packs/day: 0.00    Average packs/day: 1 pack/day for 5.0 years (5.0 ttl pk-yrs)    Types: Cigarettes    Start date: 06/20/1966    Quit date: 06/21/1971    Years since quitting: 52.6   Smokeless tobacco: Former    Types: Chew    Quit date: 06/20/1980  Vaping Use   Vaping status: Never Used  Substance and Sexual Activity   Alcohol use: Not Currently    Comment: rare glass of wine, monthly or less   Drug use: No   Sexual activity: Not Currently  Other Topics Concern   Not on file  Social History Narrative   Lives in [place]   Retired school principal   Social Drivers of Health   Financial Resource Strain: Low Risk  (01/31/2024)   Overall Financial Resource Strain (CARDIA)    Difficulty of Paying Living Expenses: Not hard at all  Food Insecurity: No Food Insecurity (01/31/2024)   Hunger Vital Sign    Worried About Running Out of Food in the Last Year: Never true    Ran Out of Food in the Last Year: Never true  Transportation Needs: No Transportation Needs (01/31/2024)   PRAPARE - Administrator, Civil Service (Medical): No  Lack of Transportation (Non-Medical): No  Physical Activity: Insufficiently Active (01/31/2024)   Exercise Vital Sign    Days of Exercise per Week: 3 days    Minutes of Exercise per Session: 30 min  Stress: No Stress Concern Present (01/31/2024)   [employer] of Occupational Health - Occupational Stress Questionnaire    Feeling of Stress : Not at all  Social Connections: Socially Integrated (01/31/2024)   Social Connection and Isolation Panel [NHANES]    Frequency of Communication with Friends and Family: Three times a week    Frequency of Social Gatherings with Friends and Family: More than three times a week    Attends Religious Services: More than 4 times per year    Active Member of [employer] or Organizations: Yes    Attends Engineer, structural: More than 4 times per year    Marital Status: Married  Catering manager Violence: Not At Risk (11/04/2023)   Humiliation, Afraid, Rape, and Kick questionnaire    Fear of Current or Ex-Partner: No    Emotionally Abused: No    Physically Abused: No    Sexually Abused: No   Social History   Tobacco Use  Smoking Status Former   Current packs/day: 0.00   Average packs/day: 1 pack/day for 5.0 years (5.0 ttl pk-yrs)   Types: Cigarettes   Start date: 06/20/1966   Quit date: 06/21/1971   Years since quitting: 52.6  Smokeless Tobacco Former   Types: Chew   Quit date: 06/20/1980   Social History   Substance and Sexual Activity  Alcohol Use Not Currently   Comment: rare glass of wine, monthly or less    Family History:  Family History  Problem Relation Age of Onset   Hypertension Mother    Heart disease Mother    Heart attack Mother    Diabetes Mother    Alzheimer's disease Father    Alzheimer's disease Brother    Congestive Heart Failure Brother    Hypertension Brother    Other Brother        dementia   Heart disease Brother    Brain cancer Daughter 65   Cancer Daughter    Heart disease Son    Cancer Maternal Uncle    Cancer Maternal Grandfather     Past medical history, surgical history, medications, allergies, family history and social history reviewed with patient today and changes made to appropriate areas of the chart.   Review of Systems  Constitutional:   Negative for chills, fever and malaise/fatigue.  HENT:  Negative for congestion, nosebleeds and tinnitus.   Eyes:  Negative for blurred vision and double vision.  Respiratory:  Negative for cough, shortness of breath and wheezing.        Can become winded when walking up a steep hill on the golf course or walking the neighborhood.  Cardiovascular:  Negative for chest pain, palpitations and leg swelling.  Gastrointestinal:  Negative for constipation, diarrhea, heartburn, nausea and vomiting.  Genitourinary:  Negative for frequency, hematuria and urgency.  Musculoskeletal:  Negative for back pain, joint pain and neck pain.  Skin:  Negative for itching and rash.  Neurological:  Negative for dizziness, tingling and headaches.  Endo/Heme/Allergies:  Bruises/bleeds easily.       Sees oncology and hematology regularly.  Psychiatric/Behavioral:  Negative for depression and suicidal ideas. The patient is not nervous/anxious.    All other ROS negative except what is listed above and in the HPI.      Objective:  BP 130/68 (BP Location: Left Arm, Patient Position: Sitting, Cuff Size: Normal)   Pulse 76   Temp 97.6 F (36.4 C) (Oral)   Ht 6' (1.829 m)   Wt 197 lb 9.6 oz (89.6 kg)   SpO2 98%   BMI 26.80 kg/m   Wt Readings from Last 3 Encounters:  02/03/24 197 lb 9.6 oz (89.6 kg)  12/12/23 196 lb 6.4 oz (89.1 kg)  11/04/23 188 lb (85.3 kg)    Physical Exam Vitals and nursing note reviewed.  Constitutional:      General: He is not in acute distress.    Appearance: Normal appearance. He is well-developed and well-groomed. He is not ill-appearing.  HENT:     Head: Normocephalic.     Right Ear: Hearing, tympanic membrane, ear canal and external ear normal. There is no impacted cerumen.     Left Ear: Hearing, tympanic membrane, ear canal and external ear normal. There is no impacted cerumen.     Nose: Nose normal. No nasal tenderness or congestion.     Mouth/Throat:     Lips: Pink.      Mouth: Mucous membranes are moist.     Pharynx: Oropharynx is clear. No oropharyngeal exudate or posterior oropharyngeal erythema.  Eyes:     General: Lids are normal.     Extraocular Movements: Extraocular movements intact.     Conjunctiva/sclera: Conjunctivae normal.     Pupils: Pupils are equal, round, and reactive to light.  Neck:     Thyroid: No thyroid mass or thyroid tenderness.     Vascular: No carotid bruit.     Trachea: Trachea normal.  Cardiovascular:     Rate and Rhythm: Normal rate and regular rhythm.     Heart sounds: Normal heart sounds. No murmur heard. Pulmonary:     Effort: Pulmonary effort is normal. No respiratory distress.     Breath sounds: Normal breath sounds. No wheezing.  Abdominal:     General: Bowel sounds are normal. There is no distension.     Palpations: Abdomen is soft.     Tenderness: There is no abdominal tenderness.  Musculoskeletal:        General: Normal range of motion.     Cervical back: Full passive range of motion without pain, normal range of motion and neck supple. No tenderness.     Right lower leg: No edema.     Left lower leg: No edema.  Lymphadenopathy:     Cervical: No cervical adenopathy.     Right cervical: No superficial cervical adenopathy.    Left cervical: No superficial cervical adenopathy.  Skin:    General: Skin is warm and dry.     Findings: No bruising or erythema.  Neurological:     General: No focal deficit present.     Mental Status: He is alert and oriented to person, place, and time. Mental status is at baseline.     Deep Tendon Reflexes: Reflexes normal.  Psychiatric:        Attention and Perception: Attention and perception normal.        Mood and Affect: Mood and affect normal.        Speech: Speech normal.        Behavior: Behavior normal. Behavior is cooperative.        Thought Content: Thought content normal.        Cognition and Memory: Cognition and memory normal.        Judgment: Judgment normal.  11/04/2023    1:38 PM 10/28/2022    1:11 PM 10/09/2020   10:35 AM 10/01/2018    1:44 PM 09/11/2017    8:56 AM  6CIT Screen  What Year? 0 points 0 points 0 points 0 points 0 points  What month? 0 points 0 points 0 points 0 points 0 points  What time? 0 points 0 points 0 points 0 points 0 points  Count back from 20 0 points 0 points 0 points 0 points 0 points  Months in reverse 0 points 0 points 0 points 0 points 0 points  Repeat phrase 0 points 0 points 0 points 0 points 0 points  Total Score 0 points 0 points 0 points 0 points 0 points   Results for orders placed or performed in visit on 02/03/24  Microalbumin, Urine Waived   Collection Time: 02/03/24  2:09 PM  Result Value Ref Range   Microalb, Ur Waived 30 (H) 0 - 19 mg/L   Creatinine, Urine Waived 50 10 - 300 mg/dL   Microalb/Creat Ratio 30-300 (H) <30 mg/g      Assessment & Plan:   Problem List Items Addressed This Visit       Cardiovascular and Mediastinum   CAD (coronary artery disease)   Chronic, stable. Continues to play golf regularly and walking 2 miles a day. Continue current medication regimen. Labs today CBC, CMP, Lipid panel. Follow up in 6 months.      Relevant Orders   Comprehensive metabolic panel   Lipid Panel w/o Chol/HDL Ratio   Essential hypertension   Chronic, stable. BP at goal in office and at home. Recommend she monitor BP at least a few mornings a week at home and document.  DASH diet at home.  Continue current medication regimen and adjust as needed.  Labs today: CBC, CMP, TSH.  Recommend good hydration while playing golf to prevent dehydration and dizziness.       Relevant Orders   Microalbumin, Urine Waived (Completed)   CBC with Differential/Platelet   Comprehensive metabolic panel   TSH   Ventricular tachycardia (HCC) - Primary   Chronic, ongoing. Ablation in 2018. Continue with current POC and collaboration with cardiology. Follow up in 6 months.      Relevant Orders    Comprehensive metabolic panel   Lipid Panel w/o Chol/HDL Ratio     Digestive   Eosinophilic esophagitis   Chronic, stable. Continue collaboration with GI and ENT as needed. Continue current medication regimen. Follow up in 6 months.      Relevant Orders   Magnesium     Musculoskeletal and Integument   Osteoporosis of lumbar spine   Chronic, ongoing. Recommend continuing Vitamin D supplement and calcium daily. Follow up in 6 months.      Relevant Orders   VITAMIN D 25 Hydroxy (Vit-D Deficiency, Fractures)     Genitourinary   BPH (benign prostatic hyperplasia)   No current symptoms. PSA on labs today per patient request. We discussed guidelines on screening, he requests to continue PSA checks.      Relevant Orders   PSA     Hematopoietic and Hemostatic   Thrombocytopenia (HCC)   No current symptoms. Continue current POC. No intervention needed.      Relevant Orders   CBC with Differential/Platelet     Other   Bradycardia (Chronic)   No current symptoms. Continue collaboration with cardiology. Follow up in 6 months.      Leukopenia (Chronic)   Chronic, ongoing.  Continue collaboration with hematology, recent note and labs reviewed.      Hyperlipidemia   Chonic, stable. Continue current medication regimen. Labs checked today Lipid panel. Follow up in 6 months.      Relevant Orders   Comprehensive metabolic panel   Lipid Panel w/o Chol/HDL Ratio   Other Visit Diagnoses       IFG (impaired fasting glucose)       Concerns about blood sugar -- check A1c.   Relevant Orders   HgB A1c     Encounter for annual physical exam       Annual physical today with labs and health maintenance reviewed, discussed with patient.       Discussed aspirin prophylaxis for myocardial infarction prevention and decision was made to continue ASA  LABORATORY TESTING:  Health maintenance labs ordered today as discussed above.   The natural history of prostate cancer and ongoing  controversy regarding screening and potential treatment outcomes of prostate cancer has been discussed with the patient. The meaning of a false positive PSA and a false negative PSA has been discussed. He indicates understanding of the limitations of this screening test and wishes to proceed with screening PSA testing.   IMMUNIZATIONS:   - Tdap: Tetanus vaccination status reviewed: Up To Date - Influenza: Up to date - Pneumovax: Up To Date - Prevnar: Up To Date - Zostavax vaccine: Up to date  SCREENING: - Colonoscopy: Up to date  Discussed with patient purpose of the colonoscopy is to detect colon cancer at curable precancerous or early stages   - AAA Screening: Up to date  -Hearing Test: not applicable -Spirometry: not applicable  PATIENT COUNSELING:    Sexuality: Discussed sexually transmitted diseases, partner selection, use of condoms, avoidance of unintended pregnancy  and contraceptive alternatives.   Advised to avoid cigarette smoking.  I discussed with the patient that most people either abstain from alcohol or drink within safe limits (<=14/week and <=4 drinks/occasion for males, <=7/weeks and <= 3 drinks/occasion for females) and that the risk for alcohol disorders and other health effects rises proportionally with the number of drinks per week and how often a drinker exceeds daily limits.  Discussed cessation/primary prevention of drug use and availability of treatment for abuse.   Diet: Encouraged to adjust caloric intake to maintain  or achieve ideal body weight, to reduce intake of dietary saturated fat and total fat, to limit sodium intake by avoiding high sodium foods and not adding table salt, and to maintain adequate dietary potassium and calcium preferably from fresh fruits, vegetables, and low-fat dairy products.    Stressed the importance of regular exercise  Injury prevention: Discussed safety belts, safety helmets, smoke detector, smoking near bedding or  upholstery.   Dental health: Discussed importance of regular tooth brushing, flossing, and dental visits.   Follow up plan: NEXT PREVENTATIVE PHYSICAL DUE IN 1 YEAR. Return in about 6 months (around 08/05/2024) for HTN/HLD, OSTEOPOROSIS.

## 2024-02-03 NOTE — Assessment & Plan Note (Addendum)
 Chronic, stable. Continue collaboration with GI and ENT as needed. Continue current medication regimen. Follow up in 6 months.

## 2024-02-03 NOTE — Assessment & Plan Note (Addendum)
 Chronic, stable. BP at goal in office and at home. Recommend she monitor BP at least a few mornings a week at home and document.  DASH diet at home.  Continue current medication regimen and adjust as needed.  Labs today: CBC, CMP, TSH.  Recommend good hydration while playing golf to prevent dehydration and dizziness.

## 2024-02-03 NOTE — Assessment & Plan Note (Signed)
 Chronic, ongoing. Recommend continuing Vitamin D supplement and calcium daily. Follow up in 6 months.

## 2024-02-03 NOTE — Assessment & Plan Note (Addendum)
 No current symptoms. PSA on labs today per patient request. We discussed guidelines on screening, he requests to continue PSA checks.

## 2024-02-03 NOTE — Assessment & Plan Note (Signed)
 Chronic, stable. Continues to play golf regularly and walking 2 miles a day. Continue current medication regimen. Labs today CBC, CMP, Lipid panel. Follow up in 6 months.

## 2024-02-04 ENCOUNTER — Encounter: Payer: Self-pay | Admitting: Nurse Practitioner

## 2024-02-04 LAB — VITAMIN D 25 HYDROXY (VIT D DEFICIENCY, FRACTURES): Vit D, 25-Hydroxy: 44.6 ng/mL (ref 30.0–100.0)

## 2024-02-04 LAB — CBC WITH DIFFERENTIAL/PLATELET
Basophils Absolute: 0 10*3/uL (ref 0.0–0.2)
Basos: 1 %
EOS (ABSOLUTE): 0.2 10*3/uL (ref 0.0–0.4)
Eos: 4 %
Hematocrit: 39.5 % (ref 37.5–51.0)
Hemoglobin: 13.2 g/dL (ref 13.0–17.7)
Immature Grans (Abs): 0 10*3/uL (ref 0.0–0.1)
Immature Granulocytes: 0 %
Lymphocytes Absolute: 0.8 10*3/uL (ref 0.7–3.1)
Lymphs: 21 %
MCH: 31.7 pg (ref 26.6–33.0)
MCHC: 33.4 g/dL (ref 31.5–35.7)
MCV: 95 fL (ref 79–97)
Monocytes Absolute: 0.5 10*3/uL (ref 0.1–0.9)
Monocytes: 12 %
Neutrophils Absolute: 2.4 10*3/uL (ref 1.4–7.0)
Neutrophils: 62 %
Platelets: 165 10*3/uL (ref 150–450)
RBC: 4.17 x10E6/uL (ref 4.14–5.80)
RDW: 12.6 % (ref 11.6–15.4)
WBC: 3.8 10*3/uL (ref 3.4–10.8)

## 2024-02-04 LAB — MAGNESIUM: Magnesium: 2 mg/dL (ref 1.6–2.3)

## 2024-02-04 LAB — TSH: TSH: 0.733 u[IU]/mL (ref 0.450–4.500)

## 2024-02-04 LAB — COMPREHENSIVE METABOLIC PANEL
ALT: 18 IU/L (ref 0–44)
AST: 26 IU/L (ref 0–40)
Albumin: 4 g/dL (ref 3.8–4.8)
Alkaline Phosphatase: 80 IU/L (ref 44–121)
BUN/Creatinine Ratio: 13 (ref 10–24)
BUN: 11 mg/dL (ref 8–27)
Bilirubin Total: 0.6 mg/dL (ref 0.0–1.2)
CO2: 25 mmol/L (ref 20–29)
Calcium: 9.9 mg/dL (ref 8.6–10.2)
Chloride: 101 mmol/L (ref 96–106)
Creatinine, Ser: 0.82 mg/dL (ref 0.76–1.27)
Globulin, Total: 2.3 g/dL (ref 1.5–4.5)
Glucose: 94 mg/dL (ref 70–99)
Potassium: 4.6 mmol/L (ref 3.5–5.2)
Sodium: 139 mmol/L (ref 134–144)
Total Protein: 6.3 g/dL (ref 6.0–8.5)
eGFR: 91 mL/min/{1.73_m2} (ref >59)

## 2024-02-04 LAB — LIPID PANEL W/O CHOL/HDL RATIO
Cholesterol, Total: 122 mg/dL (ref 100–199)
HDL: 56 mg/dL (ref >39)
LDL Chol Calc (NIH): 52 mg/dL (ref 0–99)
Triglycerides: 68 mg/dL (ref 0–149)
VLDL Cholesterol Cal: 14 mg/dL (ref 5–40)

## 2024-02-04 LAB — HEMOGLOBIN A1C
Est. average glucose Bld gHb Est-mCnc: 126 mg/dL
Hgb A1c MFr Bld: 6 % — ABNORMAL HIGH (ref 4.8–5.6)

## 2024-02-04 LAB — PSA: Prostate Specific Ag, Serum: 0.5 ng/mL (ref 0.0–4.0)

## 2024-02-04 NOTE — Progress Notes (Signed)
 Contacted via MyChart   Good morning Phil, your labs have returned: - A1c, the diabetes testing, is showing prediabetic level.  Prediabetes is any level 5.7 to 6.4%.  If 6.5% or greater we consider it diabetes.  I highly recommend going to the American Diabetes Association website.  They have a ton of information on prediabetes and diet changes.  You will have to eat less sweets.:( We will recheck this next visit. - Remainder of labs look fantastic and I have no concerns.  Continue all current medications.  Any questions? Keep being amazing!!  Thank you for allowing me to participate in your care.  I appreciate you. Kindest regards, Iyana Topor

## 2024-02-09 ENCOUNTER — Other Ambulatory Visit: Payer: Self-pay | Admitting: Cardiovascular Disease

## 2024-02-09 DIAGNOSIS — R600 Localized edema: Secondary | ICD-10-CM

## 2024-02-28 ENCOUNTER — Other Ambulatory Visit: Payer: Self-pay | Admitting: Nurse Practitioner

## 2024-02-28 ENCOUNTER — Other Ambulatory Visit: Payer: Self-pay | Admitting: Cardiovascular Disease

## 2024-02-28 DIAGNOSIS — R600 Localized edema: Secondary | ICD-10-CM

## 2024-03-01 NOTE — Telephone Encounter (Signed)
 Requested Prescriptions  Pending Prescriptions Disp Refills   pantoprazole (PROTONIX) 40 MG tablet [Pharmacy Med Name: PANTOPRAZOLE 40MG  TABLETS] 90 tablet 1    Sig: TAKE 1 TABLET(40 MG) BY MOUTH DAILY     Gastroenterology: Proton Pump Inhibitors Passed - 03/01/2024  5:04 PM      Passed - Valid encounter within last 12 months    Recent Outpatient Visits           3 weeks ago Ventricular tachycardia [facility])   [facility] Aura Dials T, NP       Future Appointments             In 2 weeks Laurier Nancy, MD [facility]   In 5 months Acampo, Dorie Rank, NP [facility]

## 2024-03-02 ENCOUNTER — Other Ambulatory Visit: Payer: Self-pay | Admitting: Nurse Practitioner

## 2024-03-02 DIAGNOSIS — E7800 Pure hypercholesterolemia, unspecified: Secondary | ICD-10-CM

## 2024-03-02 DIAGNOSIS — I2510 Atherosclerotic heart disease of native coronary artery without angina pectoris: Secondary | ICD-10-CM

## 2024-03-04 NOTE — Telephone Encounter (Signed)
 Requested Prescriptions  Pending Prescriptions Disp Refills   niacin (NIASPAN) 1000 MG CR tablet [Pharmacy Med Name: NIACIN ER 1000MG  TABLETS] 90 tablet 3    Sig: TAKE 1 TABLET(1000 MG) BY MOUTH AT BEDTIME     Cardiovascular:  Antilipid - niacin Failed - 03/04/2024  9:09 AM      Failed - Lipid Panel in normal range within the last 12 months    Cholesterol, Total  Date Value Ref Range Status  02/03/2024 122 100 - 199 mg/dL Final   Cholesterol Piccolo, Waived  Date Value Ref Range Status  05/31/2019 136 <200 mg/dL Final    Comment:                            Desirable                <200                         Borderline High      200- 239                         High                     >239    LDL Chol Calc (NIH)  Date Value Ref Range Status  02/03/2024 52 0 - 99 mg/dL Final   HDL  Date Value Ref Range Status  02/03/2024 56 >39 mg/dL Final   Triglycerides  Date Value Ref Range Status  02/03/2024 68 0 - 149 mg/dL Final   Triglycerides Piccolo,Waived  Date Value Ref Range Status  05/31/2019 87 <150 mg/dL Final    Comment:                            Normal                   <150                         Borderline High     150 - 199                         High                200 - 499                         Very High                >499          Passed - AST in normal range and within 180 days    AST  Date Value Ref Range Status  02/03/2024 26 0 - 40 IU/L Final  09/18/2023 27 15 - 41 U/L Final         Passed - ALT in normal range and within 180 days    ALT  Date Value Ref Range Status  02/03/2024 18 0 - 44 IU/L Final  09/18/2023 20 0 - 44 U/L Final         Passed - Valid encounter within last 12 months    Recent Outpatient Visits           1 month ago Ventricular tachycardia (HCC)   [facility] [place], Corrie Dandy T, NP       Future Appointments             In 1 week Laurier Nancy, MD [facility]   In 5 months  [place], Dorie Rank, NP [place] [facility], [place]

## 2024-03-13 ENCOUNTER — Other Ambulatory Visit: Payer: Self-pay | Admitting: Nurse Practitioner

## 2024-03-13 DIAGNOSIS — I2510 Atherosclerotic heart disease of native coronary artery without angina pectoris: Secondary | ICD-10-CM

## 2024-03-13 DIAGNOSIS — E7800 Pure hypercholesterolemia, unspecified: Secondary | ICD-10-CM

## 2024-03-15 ENCOUNTER — Ambulatory Visit: Payer: Medicare PPO | Admitting: Cardiovascular Disease

## 2024-03-15 ENCOUNTER — Encounter: Payer: Self-pay | Admitting: Cardiovascular Disease

## 2024-03-15 VITALS — BP 117/71 | HR 73 | Ht 71.0 in | Wt 194.0 lb

## 2024-03-15 DIAGNOSIS — E7800 Pure hypercholesterolemia, unspecified: Secondary | ICD-10-CM

## 2024-03-15 DIAGNOSIS — I2510 Atherosclerotic heart disease of native coronary artery without angina pectoris: Secondary | ICD-10-CM

## 2024-03-15 DIAGNOSIS — I472 Ventricular tachycardia, unspecified: Secondary | ICD-10-CM | POA: Diagnosis not present

## 2024-03-15 DIAGNOSIS — I2583 Coronary atherosclerosis due to lipid rich plaque: Secondary | ICD-10-CM

## 2024-03-15 DIAGNOSIS — R001 Bradycardia, unspecified: Secondary | ICD-10-CM | POA: Diagnosis not present

## 2024-03-15 DIAGNOSIS — I10 Essential (primary) hypertension: Secondary | ICD-10-CM

## 2024-03-15 NOTE — Progress Notes (Signed)
 Cardiology Office Note   Date:  03/15/2024   ID:  Shawn Decant Sr., DOB 15-Nov-1946, MRN [id number]  PCP:  Marjie Skiff, NP  Cardiologist:  Adrian Blackwater, MD      History of Present Illness: Shawn Decant Sr. is a 78 y.o. male who presents for  Chief Complaint  Patient presents with   Follow-up    3 month follow up    Feeling good, no chest pain or SOB.      Past Medical History:  Diagnosis Date   Allergy 1967   Arthritis    Asthma 2022   Eosinophilic Esophagitis   Basal cell carcinoma 1999   basal cell/skin   CAD (coronary artery disease)    Cataract Cataract surgery 2017   GERD (gastroesophageal reflux disease)    History of bone marrow biopsy    History of kidney stones 2013   passed stone on his own   Hyperlipidemia    Hypertension    Leukopenia    Shingles    Ventricular tachycardia [facility])      Past Surgical History:  Procedure Laterality Date   CARDIAC CATHETERIZATION N/A 01/16/2016   Procedure: Left Heart Cath and Coronary Angiography;  Surgeon: Laurier Nancy, MD;  Location: [facility] INVASIVE CV LAB;  Service: Cardiovascular;  Laterality: N/A;   CATARACT EXTRACTION Left 2017   COLONOSCOPY WITH PROPOFOL N/A 03/27/2020   Procedure: COLONOSCOPY WITH PROPOFOL;  Surgeon: Midge Minium, MD;  Location: [facility]ERY [facility];  Service: Endoscopy;  Laterality: N/A;  priority 4   CORONARY ANGIOPLASTY  2000 and 2003   ESOPHAGOGASTRODUODENOSCOPY (EGD) WITH PROPOFOL N/A 04/01/2022   Procedure: ESOPHAGOGASTRODUODENOSCOPY (EGD) WITH PROPOFOL;  Surgeon: Wyline Mood, MD;  Location: [facility] ENDOSCOPY;  Service: Gastroenterology;  Laterality: N/A;   EYE SURGERY  2017   Cateract   EYE SURGERY Left 04/09/2022   GANGLION CYST EXCISION  1979   HAND SURGERY Right 02/08/2021   blood clot removal per patient   KNEE ARTHROSCOPY WITH MEDIAL MENISECTOMY Left 12/01/2017   Procedure: KNEE ARTHROSCOPY WITH MEDIAL MENISECTOMY;  Surgeon: Lyndle Herrlich, MD;  Location: [facility]  ORS;  Service: Orthopedics;  Laterality: Left;  Partial   LEFT HEART CATH Right 08/07/2017   Procedure: Left Heart Cath;  Surgeon: Laurier Nancy, MD;  Location: [facility] INVASIVE CV LAB;  Service: Cardiovascular;  Laterality: Right;   PILONIDAL CYST EXCISION  1977   POLYPECTOMY  03/27/2020   Procedure: POLYPECTOMY;  Surgeon: Midge Minium, MD;  Location: [facility] SURGERY CNTR;  Service: Endoscopy;;   SKIN CANCER EXCISION  1999   ear   SYNOVECTOMY Left 12/01/2017   Procedure: SYNOVECTOMY;  Surgeon: Lyndle Herrlich, MD;  Location: [facility] ORS;  Service: Orthopedics;  Laterality: Left;  Partial   V TACH ABLATION N/A 08/14/2017   Procedure: V Tach Ablation;  Surgeon: Regan Lemming, MD;  Location: [facility] INVASIVE CV LAB;  Service: Cardiovascular;  Laterality: N/A;   VEIN SURGERY  2000   laser     Current Outpatient Medications  Medication Sig Dispense Refill   acetaminophen (TYLENOL) 500 MG tablet Take 1,000 mg by mouth every 6 (six) hours as needed for moderate pain or headache.     ALLERGY RELIEF 180 MG tablet TAKE 1 TABLET BY MOUTH EVERY DAY 90 tablet 1   aspirin EC 81 MG tablet Take 81 mg by mouth daily.     atorvastatin (LIPITOR) 80 MG tablet TAKE 1 TABLET(80 MG) BY MOUTH AT BEDTIME 90 tablet 0  Cholecalciferol (VITAMIN D3) 50 MCG (2000 UT) CHEW Chew 1 tablet by mouth daily.     flecainide (TAMBOCOR) 100 MG tablet Take 2 tablets (200 mg total) by mouth as needed. As needed for recurrent tachycardia.  Do not repeat more than once per day or 3x per wk 2 tablet 3   furosemide (LASIX) 20 MG tablet TAKE 1 TABLET BY MOUTH EVERY DAY 90 tablet 0   metoprolol succinate (TOPROL XL) 25 MG 24 hr tablet Take 1 tablet (25 mg total) by mouth daily. 30 tablet 11   montelukast (SINGULAIR) 10 MG tablet Take 1 tablet (10 mg total) by mouth at bedtime. 90 tablet 4   niacin (NIASPAN) 1000 MG CR tablet TAKE 1 TABLET(1000 MG) BY MOUTH AT BEDTIME 90 tablet 3   pantoprazole (PROTONIX) 40 MG tablet TAKE 1 TABLET(40  MG) BY MOUTH DAILY 90 tablet 1   ramipril (ALTACE) 10 MG capsule TAKE 1 CAPSULE BY MOUTH DAILY 90 capsule 0   vitamin B-12 (CYANOCOBALAMIN) 1000 MCG tablet Take 1 tablet (1,000 mcg total) by mouth daily. 90 tablet 0   No current facility-administered medications for this visit.    Allergies:   Egg-derived products and Peanut (diagnostic)    Social History:   reports that he quit smoking about 52 years ago. His smoking use included cigarettes. He started smoking about 57 years ago. He has a 5 pack-year smoking history. He quit smokeless tobacco use about 43 years ago.  His smokeless tobacco use included chew. He reports that he does not currently use alcohol. He reports that he does not use drugs.   Family History:  family history includes Alzheimer's disease in his brother and father; Brain cancer (age of onset: 30) in his daughter; Cancer in his daughter, maternal grandfather, and maternal uncle; Congestive Heart Failure in his brother; Diabetes in his mother; Heart attack in his mother; Heart disease in his brother, mother, and son; Hypertension in his brother and mother; Other in his brother.    ROS:     Review of Systems  Constitutional: Negative.   HENT: Negative.    Eyes: Negative.   Respiratory: Negative.    Gastrointestinal: Negative.   Genitourinary: Negative.   Musculoskeletal: Negative.   Skin: Negative.   Neurological: Negative.   Endo/Heme/Allergies: Negative.   Psychiatric/Behavioral: Negative.    All other systems reviewed and are negative.     All other systems are reviewed and negative.    PHYSICAL EXAM: VS:  BP 117/71   Pulse 73   Ht 5\' 11"  (1.803 m)   Wt 194 lb (88 kg)   SpO2 97%   BMI 27.06 kg/m  , BMI Body mass index is 27.06 kg/m. Last weight:  Wt Readings from Last 3 Encounters:  03/15/24 194 lb (88 kg)  02/03/24 197 lb 9.6 oz (89.6 kg)  12/12/23 196 lb 6.4 oz (89.1 kg)     Physical Exam Vitals reviewed.  Constitutional:      Appearance:  Normal appearance. He is normal weight.  HENT:     Head: Normocephalic.     Nose: Nose normal.     Mouth/Throat:     Mouth: Mucous membranes are moist.  Eyes:     Pupils: Pupils are equal, round, and reactive to light.  Cardiovascular:     Rate and Rhythm: Normal rate and regular rhythm.     Pulses: Normal pulses.     Heart sounds: Normal heart sounds.  Pulmonary:     Effort: Pulmonary effort  is normal.  Abdominal:     General: Abdomen is flat. Bowel sounds are normal.  Musculoskeletal:        General: Normal range of motion.     Cervical back: Normal range of motion.  Skin:    General: Skin is warm.  Neurological:     General: No focal deficit present.     Mental Status: He is alert.  Psychiatric:        Mood and Affect: Mood normal.       EKG:   Recent Labs: 02/03/2024: ALT 18; BUN 11; Creatinine, Ser 0.82; Hemoglobin 13.2; Magnesium 2.0; Platelets 165; Potassium 4.6; Sodium 139; TSH 0.733    Lipid Panel    Component Value Date/Time   CHOL 122 02/03/2024 1411   CHOL 136 05/31/2019 0852   TRIG 68 02/03/2024 1411   TRIG 87 05/31/2019 0852   HDL 56 02/03/2024 1411   CHOLHDL 2.2 11/09/2018 1005   VLDL 17 05/31/2019 0852   LDLCALC 52 02/03/2024 1411      Other studies Reviewed: Additional studies/ records that were reviewed today include:  Review of the above records demonstrates:       No data to display            ASSESSMENT AND PLAN:    ICD-10-CM   1. Bradycardia  R00.1    Feels much better after changing metoprolol 50 to 25, has more energy.    2. Ventricular tachycardia (HCC)  I47.20    no episodes.    3. Essential hypertension  I10    Normal BP    4. Coronary artery disease due to lipid rich plaque  I25.10    I25.83     5. Pure hypercholesterolemia  E78.00        Problem List Items Addressed This Visit       Cardiovascular and Mediastinum   CAD (coronary artery disease)   Essential hypertension   Ventricular tachycardia (HCC)      Other   Bradycardia - Primary (Chronic)   Hyperlipidemia       Disposition:   Return in about 3 months (around 06/14/2024).    Total time spent: 40 minutes  Signed,  Debborah Fairly, MD  03/15/2024 9:27 AM    [facility]

## 2024-03-15 NOTE — Telephone Encounter (Signed)
 Requested Prescriptions  Pending Prescriptions Disp Refills   atorvastatin (LIPITOR) 80 MG tablet [Pharmacy Med Name: ATORVASTATIN 80MG  TABLETS] 90 tablet 2    Sig: TAKE 1 TABLET(80 MG) BY MOUTH AT BEDTIME     Cardiovascular:  Antilipid - Statins Failed - 03/15/2024  2:52 PM      Failed - Lipid Panel in normal range within the last 12 months    Cholesterol, Total  Date Value Ref Range Status  02/03/2024 122 100 - 199 mg/dL Final   Cholesterol Piccolo, Waived  Date Value Ref Range Status  05/31/2019 136 <200 mg/dL Final    Comment:                            Desirable                <200                         Borderline High      200- 239                         High                     >239    LDL Chol Calc (NIH)  Date Value Ref Range Status  02/03/2024 52 0 - 99 mg/dL Final   HDL  Date Value Ref Range Status  02/03/2024 56 >39 mg/dL Final   Triglycerides  Date Value Ref Range Status  02/03/2024 68 0 - 149 mg/dL Final   Triglycerides Piccolo,Waived  Date Value Ref Range Status  05/31/2019 87 <150 mg/dL Final    Comment:                            Normal                   <150                         Borderline High     150 - 199                         High                200 - 499                         Very High                >499          Passed - Patient is not pregnant      Passed - Valid encounter within last 12 months    Recent Outpatient Visits           1 month ago Ventricular tachycardia (HCC)   [place] [facility] Marjie Skiff, NP       Future Appointments             In 3 months Laurier Nancy, MD [facility]   In 5 months [place], Dorie Rank, NP [facility]

## 2024-03-18 ENCOUNTER — Encounter: Payer: Self-pay | Admitting: Oncology

## 2024-03-18 ENCOUNTER — Inpatient Hospital Stay: Payer: Medicare PPO | Attending: Oncology

## 2024-03-18 ENCOUNTER — Inpatient Hospital Stay: Payer: Medicare PPO | Admitting: Oncology

## 2024-03-18 VITALS — BP 129/75 | HR 63 | Temp 97.2°F | Resp 18 | Wt 194.7 lb

## 2024-03-18 DIAGNOSIS — Z79899 Other long term (current) drug therapy: Secondary | ICD-10-CM | POA: Diagnosis not present

## 2024-03-18 DIAGNOSIS — C9110 Chronic lymphocytic leukemia of B-cell type not having achieved remission: Secondary | ICD-10-CM | POA: Diagnosis not present

## 2024-03-18 DIAGNOSIS — D72820 Lymphocytosis (symptomatic): Secondary | ICD-10-CM

## 2024-03-18 DIAGNOSIS — Z87891 Personal history of nicotine dependence: Secondary | ICD-10-CM | POA: Insufficient documentation

## 2024-03-18 DIAGNOSIS — D696 Thrombocytopenia, unspecified: Secondary | ICD-10-CM | POA: Diagnosis not present

## 2024-03-18 DIAGNOSIS — D708 Other neutropenia: Secondary | ICD-10-CM

## 2024-03-18 LAB — CMP (CANCER CENTER ONLY)
ALT: 19 U/L (ref 0–44)
AST: 28 U/L (ref 15–41)
Albumin: 3.7 g/dL (ref 3.5–5.0)
Alkaline Phosphatase: 67 U/L (ref 38–126)
Anion gap: 4 — ABNORMAL LOW (ref 5–15)
BUN: 13 mg/dL (ref 8–23)
CO2: 27 mmol/L (ref 22–32)
Calcium: 9.9 mg/dL (ref 8.9–10.3)
Chloride: 105 mmol/L (ref 98–111)
Creatinine: 0.9 mg/dL (ref 0.61–1.24)
GFR, Estimated: >60 mL/min (ref >60)
Glucose, Bld: 101 mg/dL — ABNORMAL HIGH (ref 70–99)
Potassium: 4.4 mmol/L (ref 3.5–5.1)
Sodium: 136 mmol/L (ref 135–145)
Total Bilirubin: 1.2 mg/dL (ref 0.0–1.2)
Total Protein: 6.4 g/dL — ABNORMAL LOW (ref 6.5–8.1)

## 2024-03-18 LAB — CBC WITH DIFFERENTIAL (CANCER CENTER ONLY)
Abs Immature Granulocytes: 0.01 10*3/uL (ref 0.00–0.07)
Basophils Absolute: 0 10*3/uL (ref 0.0–0.1)
Basophils Relative: 0 %
Eosinophils Absolute: 0.2 10*3/uL (ref 0.0–0.5)
Eosinophils Relative: 5 %
HCT: 39.3 % (ref 39.0–52.0)
Hemoglobin: 13.3 g/dL (ref 13.0–17.0)
Immature Granulocytes: 0 %
Lymphocytes Relative: 16 %
Lymphs Abs: 0.6 10*3/uL — ABNORMAL LOW (ref 0.7–4.0)
MCH: 32.2 pg (ref 26.0–34.0)
MCHC: 33.8 g/dL (ref 30.0–36.0)
MCV: 95.2 fL (ref 80.0–100.0)
Monocytes Absolute: 0.5 10*3/uL (ref 0.1–1.0)
Monocytes Relative: 13 %
Neutro Abs: 2.4 10*3/uL (ref 1.7–7.7)
Neutrophils Relative %: 66 %
Platelet Count: 148 10*3/uL — ABNORMAL LOW (ref 150–400)
RBC: 4.13 MIL/uL — ABNORMAL LOW (ref 4.22–5.81)
RDW: 13.8 % (ref 11.5–15.5)
WBC Count: 3.6 10*3/uL — ABNORMAL LOW (ref 4.0–10.5)
nRBC: 0 % (ref 0.0–0.2)

## 2024-03-18 LAB — VITAMIN B12: Vitamin B-12: 549 pg/mL (ref 180–914)

## 2024-03-18 LAB — LACTATE DEHYDROGENASE: LDH: 148 U/L (ref 98–192)

## 2024-03-18 LAB — FOLATE: Folate: 13.8 ng/mL (ref >5.9)

## 2024-03-18 NOTE — Assessment & Plan Note (Signed)
Chronic leukopenia, predominantly neutropenia Previous work-up -including bone marrow biopsy were nonremarkable. 10/26/2019 bone marrow biopsy showed no significant dyspoiesis or increase in blast cells.  No significant lymphoid aggregates.  Normal cytogenetics. Labs reviewed and discussed with patient ANC is stable.  Continue observation.

## 2024-03-18 NOTE — Assessment & Plan Note (Addendum)
 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   No constitutional symptoms.  Repeat peripheral blood flow cytometry result is pending. Recommend observation.

## 2024-03-18 NOTE — Progress Notes (Signed)
 Pt here for follow up. No new concerns voiced. 129/75

## 2024-03-18 NOTE — Assessment & Plan Note (Signed)
very mild decrease of platelet count.  No intervention needed.  Continue observation.

## 2024-03-18 NOTE — Progress Notes (Signed)
 Hematology/Oncology Progress note [phone] [phone]      CHIEF COMPLAINTS/REASON FOR VISIT:  Follow-up for management of leukopenia and thrombocytopenia,   ASSESSMENT & PLAN:   Monoclonal B-cell lymphocytosis of undetermined significance Previous peripheral blood flow cytometry showed 2 minute CD5/CD23 positive monoclonal B-cell population, CLL/SLL phenotype.   No constitutional symptoms.  Repeat peripheral blood flow cytometry result is pending. Recommend observation.  Leukopenia Chronic leukopenia, predominantly neutropenia Previous work-up -including bone marrow biopsy were nonremarkable. 10/26/2019 bone marrow biopsy showed no significant dyspoiesis or increase in blast cells.  No significant lymphoid aggregates.  Normal cytogenetics. Labs reviewed and discussed with patient ANC is stable.  Continue observation.  Thrombocytopenia (HCC)  very mild decrease of platelet count.  No intervention needed.  Continue observation.    Orders Placed This Encounter  Procedures   CMP (Cancer Center only)    Standing Status:   Future    Expected Date:   09/17/2024    Expiration Date:   03/18/2025   CBC with Differential (Cancer Center Only)    Standing Status:   Future    Expected Date:   09/17/2024    Expiration Date:   03/18/2025   Lactate dehydrogenase    Standing Status:   Future    Expected Date:   09/17/2024    Expiration Date:   03/18/2025  Follow-up 6 months   all questions were answered. The patient knows to call the clinic with any problems, questions or concerns.  Rickard Patience, MD, PhD [facility] Hematology Oncology 03/18/2024     HISTORY OF PRESENTING ILLNESS:  Shawn PLAIN Sr. is a  78 y.o.  male with PMH listed below who was referred to me for evaluation of leukopenia. Reviewed patient's recent labs. Patient has low total WBC count was 2.6, predominantly absolute lymphocyte 0.6. Earlier lab lab done 11/2019 showed WBC 2.8, lymphocyte  0.7, neutrophil 1.4.  Previous lab records reviewed. 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 No aggravating or improving factors.  Associated symptoms:  denies fatigue, weight loss, fever, chills, frequent infection.  History hepatitis or HIV infection: Denies History of chronic liver disease: Denies History of blood transfusion: Denies Alcohol consumption: Denies Diet Vegetarian or Vegan: Denies Herbal medication: Denies  Reports feeling quite tired and fatigued recently.  He and his wife recently downsized to a one-story home and he has been moving a lot of furniture's by himself.  He feels it took a week for his knee to recover after the most. Denies any unintentional weight loss, fever or chills, night sweating. He is very active, walks every day. Previous occupation was a Radiation protection practitioner.  Drinks a cocktail every day.  Usually drinks cocktail with quinine tonic water for lower extremity cramps.  He started to do this after his ventricular tachycardic episodes about 1-1/2 years ago. Former smoker, quitting in 1972.   chronic back pain and had a lumbar spine x-ray done on 12/20/2020 which showed multilevel moderate lumbar degenerative disc disease, spine spondylolisthesis, facet arthropathy  INTERVAL HISTORY Shawn Kindred Saperstein Sr. is a 78 y.o. male who has above history reviewed by me today presents for follow up visit for management of leukopenia, monoclonal lymphocytosis of unknown significance No new complaints. Chronic fatigue, unchanged.  Denies weight loss, fever, chills, fatigue, night sweats.    Review of Systems  Constitutional:  Positive for malaise/fatigue. Negative for chills, fever and weight loss.  HENT:  Negative for sore throat.   Eyes:  Negative for  redness.  Respiratory:  Negative for cough, shortness of breath and wheezing.   Cardiovascular:  Negative for chest pain, palpitations and leg swelling.  Gastrointestinal:  Negative  for abdominal pain, blood in stool, nausea and vomiting.  Genitourinary:  Negative for dysuria.  Musculoskeletal:  Negative for myalgias.  Skin:  Negative for rash.  Neurological:  Negative for dizziness, tingling and tremors.  Endo/Heme/Allergies:  Does not bruise/bleed easily.  Psychiatric/Behavioral:  Negative for hallucinations.     MEDICAL HISTORY:  Past Medical History:  Diagnosis Date   Allergy 1967   Arthritis    Asthma 2022   Eosinophilic Esophagitis   Basal cell carcinoma 1999   basal cell/skin   CAD (coronary artery disease)    Cataract Cataract surgery 2017   GERD (gastroesophageal reflux disease)    History of bone marrow biopsy    History of kidney stones 2013   passed stone on his own   Hyperlipidemia    Hypertension    Leukopenia    Shingles    Ventricular tachycardia (HCC)     SURGICAL HISTORY: Past Surgical History:  Procedure Laterality Date   CARDIAC CATHETERIZATION N/A 01/16/2016   Procedure: Left Heart Cath and Coronary Angiography;  Surgeon: Laurier Nancy, MD;  Location: [facility] INVASIVE CV LAB;  Service: Cardiovascular;  Laterality: N/A;   CATARACT EXTRACTION Left 2017   COLONOSCOPY WITH PROPOFOL N/A 03/27/2020   Procedure: COLONOSCOPY WITH PROPOFOL;  Surgeon: Midge Minium, MD;  Location: [facility]ERY [facility];  Service: Endoscopy;  Laterality: N/A;  priority 4   CORONARY ANGIOPLASTY  2000 and 2003   ESOPHAGOGASTRODUODENOSCOPY (EGD) WITH PROPOFOL N/A 04/01/2022   Procedure: ESOPHAGOGASTRODUODENOSCOPY (EGD) WITH PROPOFOL;  Surgeon: Wyline Mood, MD;  Location: [facility] ENDOSCOPY;  Service: Gastroenterology;  Laterality: N/A;   EYE SURGERY  2017   Cateract   EYE SURGERY Left 04/09/2022   GANGLION CYST EXCISION  1979   HAND SURGERY Right 02/08/2021   blood clot removal per patient   KNEE ARTHROSCOPY WITH MEDIAL MENISECTOMY Left 12/01/2017   Procedure: KNEE ARTHROSCOPY WITH MEDIAL MENISECTOMY;  Surgeon: Lyndle Herrlich, MD;  Location: [facility] ORS;  Service:  Orthopedics;  Laterality: Left;  Partial   LEFT HEART CATH Right 08/07/2017   Procedure: Left Heart Cath;  Surgeon: Laurier Nancy, MD;  Location: [facility] INVASIVE CV LAB;  Service: Cardiovascular;  Laterality: Right;   PILONIDAL CYST EXCISION  1977   POLYPECTOMY  03/27/2020   Procedure: POLYPECTOMY;  Surgeon: Midge Minium, MD;  Location: [facility] SURGERY [facility]TR;  Service: Endoscopy;;   SKIN CANCER EXCISION  1999   ear   SYNOVECTOMY Left 12/01/2017   Procedure: SYNOVECTOMY;  Surgeon: Lyndle Herrlich, MD;  Location: [facility] ORS;  Service: Orthopedics;  Laterality: Left;  Partial   V TACH ABLATION N/A 08/14/2017   Procedure: V Tach Ablation;  Surgeon: Regan Lemming, MD;  Location: [facility] INVASIVE CV LAB;  Service: Cardiovascular;  Laterality: N/A;   VEIN SURGERY  2000   laser    SOCIAL HISTORY: Social History   Socioeconomic History   Marital status: Married    Spouse name: Magda Paganini   Number of children: 2   Years of education: Not on file   Highest education level: Master's degree (e.g., MA, MS, MEng, MEd, MSW, MBA)  Occupational History   Occupation: retired  Tobacco Use   Smoking status: Former    Current packs/day: 0.00    Average packs/day: 1 pack/day for 5.0 years (5.0 ttl pk-yrs)    Types: Cigarettes  Start date: 06/20/1966    Quit date: 06/21/1971    Years since quitting: 52.7   Smokeless tobacco: Former    Types: Chew    Quit date: 06/20/1980  Vaping Use   Vaping status: Never Used  Substance and Sexual Activity   Alcohol use: Not Currently    Comment: rare glass of wine, monthly or less   Drug use: No   Sexual activity: Not Currently  Other Topics Concern   Not on file  Social History Narrative   Lives in [place]   Retired school principal   Social Drivers of Health   Financial Resource Strain: Low Risk  (01/31/2024)   Overall Financial Resource Strain (CARDIA)    Difficulty of Paying Living Expenses: Not hard at all  Food Insecurity: No Food Insecurity  (01/31/2024)   Hunger Vital Sign    Worried About Running Out of Food in the Last Year: Never true    Ran Out of Food in the Last Year: Never true  Transportation Needs: No Transportation Needs (01/31/2024)   PRAPARE - Administrator, Civil Service (Medical): No    Lack of Transportation (Non-Medical): No  Physical Activity: Insufficiently Active (01/31/2024)   Exercise Vital Sign    Days of Exercise per Week: 3 days    Minutes of Exercise per Session: 30 min  Stress: No Stress Concern Present (01/31/2024)   [employer] of Occupational Health - Occupational Stress Questionnaire    Feeling of Stress : Not at all  Social Connections: Socially Integrated (01/31/2024)   Social Connection and Isolation Panel [NHANES]    Frequency of Communication with Friends and Family: Three times a week    Frequency of Social Gatherings with Friends and Family: More than three times a week    Attends Religious Services: More than 4 times per year    Active Member of [employer] or Organizations: Yes    Attends Engineer, structural: More than 4 times per year    Marital Status: Married  Catering manager Violence: Not At Risk (11/04/2023)   Humiliation, Afraid, Rape, and Kick questionnaire    Fear of Current or Ex-Partner: No    Emotionally Abused: No    Physically Abused: No    Sexually Abused: No    FAMILY HISTORY: Family History  Problem Relation Age of Onset   Hypertension Mother    Heart disease Mother    Heart attack Mother    Diabetes Mother    Alzheimer's disease Father    Alzheimer's disease Brother    Congestive Heart Failure Brother    Hypertension Brother    Other Brother        dementia   Heart disease Brother    Brain cancer Daughter 76   Cancer Daughter    Heart disease Son    Cancer Maternal Uncle    Cancer Maternal Grandfather     ALLERGIES:  is allergic to egg-derived products and peanut (diagnostic).  MEDICATIONS:  Current Outpatient Medications   Medication Sig Dispense Refill   acetaminophen (TYLENOL) 500 MG tablet Take 1,000 mg by mouth every 6 (six) hours as needed for moderate pain or headache.     ALLERGY RELIEF 180 MG tablet TAKE 1 TABLET BY MOUTH EVERY DAY 90 tablet 1   aspirin EC 81 MG tablet Take 81 mg by mouth daily.     atorvastatin (LIPITOR) 80 MG tablet TAKE 1 TABLET(80 MG) BY MOUTH AT BEDTIME 90 tablet 1   Cholecalciferol (VITAMIN D3)  50 MCG (2000 UT) CHEW Chew 1 tablet by mouth daily.     flecainide (TAMBOCOR) 100 MG tablet Take 2 tablets (200 mg total) by mouth as needed. As needed for recurrent tachycardia.  Do not repeat more than once per day or 3x per wk 2 tablet 3   furosemide (LASIX) 20 MG tablet TAKE 1 TABLET BY MOUTH EVERY DAY 90 tablet 0   metoprolol succinate (TOPROL XL) 25 MG 24 hr tablet Take 1 tablet (25 mg total) by mouth daily. 30 tablet 11   montelukast (SINGULAIR) 10 MG tablet Take 1 tablet (10 mg total) by mouth at bedtime. 90 tablet 4   niacin (NIASPAN) 1000 MG CR tablet TAKE 1 TABLET(1000 MG) BY MOUTH AT BEDTIME 90 tablet 3   pantoprazole (PROTONIX) 40 MG tablet TAKE 1 TABLET(40 MG) BY MOUTH DAILY 90 tablet 1   ramipril (ALTACE) 10 MG capsule TAKE 1 CAPSULE BY MOUTH DAILY 90 capsule 0   vitamin B-12 (CYANOCOBALAMIN) 1000 MCG tablet Take 1 tablet (1,000 mcg total) by mouth daily. 90 tablet 0   No current facility-administered medications for this visit.     PHYSICAL EXAMINATION: ECOG PERFORMANCE STATUS: 0 - Asymptomatic Vitals:   03/18/24 1039  BP: 129/75  Pulse: 63  Resp: 18  Temp: (!) 97.2 F (36.2 C)  SpO2: 98%   Filed Weights   03/18/24 1039  Weight: 194 lb 11.2 oz (88.3 kg)    Physical Exam Constitutional:      General: He is not in acute distress. HENT:     Head: Normocephalic and atraumatic.  Eyes:     General: No scleral icterus.    Pupils: Pupils are equal, round, and reactive to light.  Cardiovascular:     Rate and Rhythm: Normal rate and regular rhythm.     Heart  sounds: Normal heart sounds.  Pulmonary:     Effort: Pulmonary effort is normal. No respiratory distress.     Breath sounds: No wheezing.  Abdominal:     General: Bowel sounds are normal. There is no distension.     Palpations: Abdomen is soft. There is no mass.     Tenderness: There is no abdominal tenderness.  Musculoskeletal:        General: No deformity. Normal range of motion.     Cervical back: Normal range of motion and neck supple.  Skin:    General: Skin is warm and dry.     Findings: No erythema or rash.  Neurological:     Mental Status: He is alert and oriented to person, place, and time.     Cranial Nerves: No cranial nerve deficit.     Coordination: Coordination normal.  Psychiatric:        Behavior: Behavior normal.        Thought Content: Thought content normal.      LABORATORY DATA:  I have reviewed the data as listed    Latest Ref Rng & Units 03/18/2024   10:19 AM 02/03/2024    2:11 PM 09/18/2023   10:01 AM  CBC  WBC 4.0 - 10.5 K/uL 3.6  3.8  2.6   Hemoglobin 13.0 - 17.0 g/dL 95.2  84.1  32.4   Hematocrit 39.0 - 52.0 % 39.3  39.5  38.8   Platelets 150 - 400 K/uL 148  165  146       Latest Ref Rng & Units 03/18/2024   10:20 AM 02/03/2024    2:11 PM 09/18/2023   10:02 AM  CMP  Glucose 70 - 99 mg/dL 387  94  73   BUN 8 - 23 mg/dL 13  11  11    Creatinine 0.61 - 1.24 mg/dL 5.64  3.32  9.51   Sodium 135 - 145 mmol/L 136  139  135   Potassium 3.5 - 5.1 mmol/L 4.4  4.6  4.3   Chloride 98 - 111 mmol/L 105  101  105   CO2 22 - 32 mmol/L 27  25  27    Calcium 8.9 - 10.3 mg/dL 9.9  9.9  9.9   Total Protein 6.5 - 8.1 g/dL 6.4  6.3  6.6   Total Bilirubin 0.0 - 1.2 mg/dL 1.2  0.6  1.1   Alkaline Phos 38 - 126 U/L 67  80  60   AST 15 - 41 U/L 28  26  27    ALT 0 - 44 U/L 19  18  20       Iron/TIBC/Ferritin/ %Sat No results found for: "IRON", "TIBC", "FERRITIN", "IRONPCTSAT"

## 2024-03-22 LAB — COMP PANEL: LEUKEMIA/LYMPHOMA

## 2024-05-25 ENCOUNTER — Other Ambulatory Visit: Payer: Self-pay | Admitting: Cardiovascular Disease

## 2024-05-25 DIAGNOSIS — R600 Localized edema: Secondary | ICD-10-CM

## 2024-06-14 ENCOUNTER — Ambulatory Visit: Admitting: Cardiovascular Disease

## 2024-06-14 ENCOUNTER — Encounter: Payer: Self-pay | Admitting: Cardiovascular Disease

## 2024-06-14 VITALS — BP 120/82 | HR 91 | Ht 71.0 in | Wt 194.4 lb

## 2024-06-14 DIAGNOSIS — I472 Ventricular tachycardia, unspecified: Secondary | ICD-10-CM | POA: Diagnosis not present

## 2024-06-14 DIAGNOSIS — R609 Edema, unspecified: Secondary | ICD-10-CM

## 2024-06-14 DIAGNOSIS — R0602 Shortness of breath: Secondary | ICD-10-CM

## 2024-06-14 DIAGNOSIS — I10 Essential (primary) hypertension: Secondary | ICD-10-CM

## 2024-06-14 DIAGNOSIS — E7800 Pure hypercholesterolemia, unspecified: Secondary | ICD-10-CM

## 2024-06-14 DIAGNOSIS — I2583 Coronary atherosclerosis due to lipid rich plaque: Secondary | ICD-10-CM

## 2024-06-14 DIAGNOSIS — I2510 Atherosclerotic heart disease of native coronary artery without angina pectoris: Secondary | ICD-10-CM

## 2024-06-14 DIAGNOSIS — R001 Bradycardia, unspecified: Secondary | ICD-10-CM

## 2024-06-14 MED ORDER — POTASSIUM CHLORIDE CRYS ER 10 MEQ PO TBCR
10.0000 meq | EXTENDED_RELEASE_TABLET | Freq: Every day | ORAL | 2 refills | Status: DC
Start: 2024-06-14 — End: 2024-09-07

## 2024-06-14 MED ORDER — FUROSEMIDE 40 MG PO TABS: 40.0000 mg | ORAL_TABLET | Freq: Every day | ORAL | 11 refills | Status: AC

## 2024-06-14 NOTE — Progress Notes (Signed)
 Cardiology Office Note   Date:  06/14/2024   ID:  Shawn DELENA Floor Sr., DOB 07-10-1946, MRN [id number]  PCP:  Valerio Melanie DASEN, NP  Cardiologist:  Denyse Bathe, MD      History of Present Illness: Shawn DELENA Floor Sr. is a 78 y.o. male who presents for  Chief Complaint  Patient presents with   Follow-up    Has edema of legs, swelling getting worse in both legs.      Past Medical History:  Diagnosis Date   Allergy  1967   Arthritis    Asthma 2022   Eosinophilic Esophagitis   Basal cell carcinoma 1999   basal cell/skin   CAD (coronary artery disease)    Cataract Cataract surgery 2017   GERD (gastroesophageal reflux disease)    History of bone marrow biopsy    History of kidney stones 2013   passed stone on his own   Hyperlipidemia    Hypertension    Leukopenia    Shingles    Ventricular tachycardia [facility])      Past Surgical History:  Procedure Laterality Date   CARDIAC CATHETERIZATION N/A 01/16/2016   Procedure: Left Heart Cath and Coronary Angiography;  Surgeon: Denyse DELENA Bathe, MD;  Location: [facility] INVASIVE CV LAB;  Service: Cardiovascular;  Laterality: N/A;   CATARACT EXTRACTION Left 2017   COLONOSCOPY WITH PROPOFOL  N/A 03/27/2020   Procedure: COLONOSCOPY WITH PROPOFOL ;  Surgeon: Jinny Carmine, MD;  Location: [facility];  Service: Endoscopy;  Laterality: N/A;  priority 4   CORONARY ANGIOPLASTY  2000 and 2003   ESOPHAGOGASTRODUODENOSCOPY (EGD) WITH PROPOFOL  N/A 04/01/2022   Procedure: ESOPHAGOGASTRODUODENOSCOPY (EGD) WITH PROPOFOL ;  Surgeon: Therisa Bi, MD;  Location: [facility] ENDOSCOPY;  Service: Gastroenterology;  Laterality: N/A;   EYE SURGERY  2017   Cateract   EYE SURGERY Left 04/09/2022   GANGLION CYST EXCISION  1979   HAND SURGERY Right 02/08/2021   blood clot removal per patient   KNEE ARTHROSCOPY WITH MEDIAL MENISECTOMY Left 12/01/2017   Procedure: KNEE ARTHROSCOPY WITH MEDIAL MENISECTOMY;  Surgeon: Leora Lynwood SAUNDERS, MD;  Location: [facility] ORS;   Service: Orthopedics;  Laterality: Left;  Partial   LEFT HEART CATH Right 08/07/2017   Procedure: Left Heart Cath;  Surgeon: Bathe Denyse DELENA, MD;  Location: [facility] INVASIVE CV LAB;  Service: Cardiovascular;  Laterality: Right;   PILONIDAL CYST EXCISION  1977   POLYPECTOMY  03/27/2020   Procedure: POLYPECTOMY;  Surgeon: Jinny Carmine, MD;  Location: [facility] SURGERY CN[facility];  Service: Endoscopy;;   SKIN CANCER EXCISION  1999   ear   SYNOVECTOMY Left 12/01/2017   Procedure: SYNOVECTOMY;  Surgeon: Leora Lynwood SAUNDERS, MD;  Location: [facility] ORS;  Service: Orthopedics;  Laterality: Left;  Partial   V TACH ABLATION N/A 08/14/2017   Procedure: V Tach Ablation;  Surgeon: Inocencio Soyla Lunger, MD;  Location: [facility] INVASIVE CV LAB;  Service: Cardiovascular;  Laterality: N/A;   VEIN SURGERY  2000   laser     Current Outpatient Medications  Medication Sig Dispense Refill   acetaminophen  (TYLENOL ) 500 MG tablet Take 1,000 mg by mouth every 6 (six) hours as needed for moderate pain or headache.     ALLERGY  RELIEF 180 MG tablet TAKE 1 TABLET BY MOUTH EVERY DAY 90 tablet 1   aspirin  EC 81 MG tablet Take 81 mg by mouth daily.     atorvastatin  (LIPITOR ) 80 MG tablet TAKE 1 TABLET(80 MG) BY MOUTH AT BEDTIME 90 tablet 1   Cholecalciferol (VITAMIN D3)  50 MCG (2000 UT) CHEW Chew 1 tablet by mouth daily.     flecainide  (TAMBOCOR ) 100 MG tablet Take 2 tablets (200 mg total) by mouth as needed. As needed for recurrent tachycardia.  Do not repeat more than once per day or 3x per wk 2 tablet 3   furosemide  (LASIX ) 40 MG tablet Take 1 tablet (40 mg total) by mouth daily. 30 tablet 11   metoprolol  succinate (TOPROL  XL) 25 MG 24 hr tablet Take 1 tablet (25 mg total) by mouth daily. 30 tablet 11   montelukast  (SINGULAIR ) 10 MG tablet Take 1 tablet (10 mg total) by mouth at bedtime. 90 tablet 4   niacin  (NIASPAN ) 1000 MG CR tablet TAKE 1 TABLET(1000 MG) BY MOUTH AT BEDTIME 90 tablet 3   pantoprazole  (PROTONIX ) 40 MG tablet TAKE 1  TABLET(40 MG) BY MOUTH DAILY 90 tablet 1   potassium chloride  (KLOR-CON  M) 10 MEQ tablet Take 1 tablet (10 mEq total) by mouth daily. 30 tablet 2   ramipril  (ALTACE ) 10 MG capsule TAKE 1 CAPSULE BY MOUTH DAILY 90 capsule 0   vitamin B-12 (CYANOCOBALAMIN ) 1000 MCG tablet Take 1 tablet (1,000 mcg total) by mouth daily. 90 tablet 0   No current facility-administered medications for this visit.    Allergies:   Egg-derived products and Peanut (diagnostic)    Social History:   reports that he quit smoking about 53 years ago. His smoking use included cigarettes. He started smoking about 58 years ago. He has a 5 pack-year smoking history. He quit smokeless tobacco use about 44 years ago.  His smokeless tobacco use included chew. He reports that he does not currently use alcohol. He reports that he does not use drugs.   Family History:  family history includes Alzheimer's disease in his brother and father; Brain cancer (age of onset: 77) in his daughter; Cancer in his daughter, maternal grandfather, and maternal uncle; Congestive Heart Failure in his brother; Diabetes in his mother; Heart attack in his mother; Heart disease in his brother, mother, and son; Hypertension in his brother and mother; Other in his brother.    ROS:     Review of Systems  Constitutional: Negative.   HENT: Negative.    Eyes: Negative.   Respiratory: Negative.    Gastrointestinal: Negative.   Genitourinary: Negative.   Musculoskeletal: Negative.   Skin: Negative.   Neurological: Negative.   Endo/Heme/Allergies: Negative.   Psychiatric/Behavioral: Negative.    All other systems reviewed and are negative.     All other systems are reviewed and negative.    PHYSICAL EXAM: VS:  BP 120/82   Pulse 91   Ht 5' 11 (1.803 m)   Wt 194 lb 6.4 oz (88.2 kg)   SpO2 96%   BMI 27.11 kg/m  , BMI Body mass index is 27.11 kg/m. Last weight:  Wt Readings from Last 3 Encounters:  06/14/24 194 lb 6.4 oz (88.2 kg)  03/18/24  194 lb 11.2 oz (88.3 kg)  03/15/24 194 lb (88 kg)     Physical Exam Vitals reviewed.  Constitutional:      Appearance: Normal appearance. He is normal weight.  HENT:     Head: Normocephalic.     Nose: Nose normal.     Mouth/Throat:     Mouth: Mucous membranes are moist.  Eyes:     Pupils: Pupils are equal, round, and reactive to light.  Cardiovascular:     Rate and Rhythm: Normal rate and regular rhythm.  Pulses: Normal pulses.     Heart sounds: Normal heart sounds.  Pulmonary:     Effort: Pulmonary effort is normal.  Abdominal:     General: Abdomen is flat. Bowel sounds are normal.  Musculoskeletal:        General: Normal range of motion.     Cervical back: Normal range of motion.  Skin:    General: Skin is warm.  Neurological:     General: No focal deficit present.     Mental Status: He is alert.  Psychiatric:        Mood and Affect: Mood normal.       EKG:   Recent Labs: 02/03/2024: Magnesium  2.0; TSH 0.733 03/18/2024: ALT 19; BUN 13; Creatinine 0.90; Hemoglobin 13.3; Platelet Count 148; Potassium 4.4; Sodium 136    Lipid Panel    Component Value Date/Time   CHOL 122 02/03/2024 1411   CHOL 136 05/31/2019 0852   TRIG 68 02/03/2024 1411   TRIG 87 05/31/2019 0852   HDL 56 02/03/2024 1411   CHOLHDL 2.2 11/09/2018 1005   VLDL 17 05/31/2019 0852   LDLCALC 52 02/03/2024 1411      Other studies Reviewed: Additional studies/ records that were reviewed today include:  Review of the above records demonstrates:       No data to display            ASSESSMENT AND PLAN:    ICD-10-CM   1. SOB (shortness of breath)  R06.02 PCV ECHOCARDIOGRAM COMPLETE    MYOCARDIAL PERFUSION IMAGING   Has SOB, swelling of legs and 2 plus edema, advise echo, stress test. Take lasix  40 with K-dure    2. Ventricular tachycardia (HCC)  I47.20 PCV ECHOCARDIOGRAM COMPLETE    MYOCARDIAL PERFUSION IMAGING    3. Essential hypertension  I10 PCV ECHOCARDIOGRAM COMPLETE     MYOCARDIAL PERFUSION IMAGING    4. Coronary artery disease due to lipid rich plaque  I25.10 PCV ECHOCARDIOGRAM COMPLETE   I25.83 MYOCARDIAL PERFUSION IMAGING    5. Bradycardia  R00.1 PCV ECHOCARDIOGRAM COMPLETE    MYOCARDIAL PERFUSION IMAGING    6. Pure hypercholesterolemia  E78.00 PCV ECHOCARDIOGRAM COMPLETE    MYOCARDIAL PERFUSION IMAGING    7. Edema, unspecified type  R60.9 PCV ECHOCARDIOGRAM COMPLETE    MYOCARDIAL PERFUSION IMAGING       Problem List Items Addressed This Visit       Cardiovascular and Mediastinum   CAD (coronary artery disease)   Relevant Medications   furosemide  (LASIX ) 40 MG tablet   Other Relevant Orders   PCV ECHOCARDIOGRAM COMPLETE   MYOCARDIAL PERFUSION IMAGING   Essential hypertension   Relevant Medications   furosemide  (LASIX ) 40 MG tablet   Other Relevant Orders   PCV ECHOCARDIOGRAM COMPLETE   MYOCARDIAL PERFUSION IMAGING   Ventricular tachycardia (HCC)   Relevant Medications   furosemide  (LASIX ) 40 MG tablet   Other Relevant Orders   PCV ECHOCARDIOGRAM COMPLETE   MYOCARDIAL PERFUSION IMAGING     Other   Bradycardia (Chronic)   Relevant Orders   PCV ECHOCARDIOGRAM COMPLETE   MYOCARDIAL PERFUSION IMAGING   Hyperlipidemia   Relevant Medications   furosemide  (LASIX ) 40 MG tablet   Other Relevant Orders   PCV ECHOCARDIOGRAM COMPLETE   MYOCARDIAL PERFUSION IMAGING   Other Visit Diagnoses       SOB (shortness of breath)    -  Primary   Has SOB, swelling of legs and 2 plus edema, advise echo, stress test. Take lasix  40 with  K-dure   Relevant Orders   PCV ECHOCARDIOGRAM COMPLETE   MYOCARDIAL PERFUSION IMAGING     Edema, unspecified type       Relevant Orders   PCV ECHOCARDIOGRAM COMPLETE   MYOCARDIAL PERFUSION IMAGING          Disposition:   Return in about 3 weeks (around 07/05/2024) for echo, stress test and f/u.    Total time spent: 45 minutes  Signed,  Denyse Bathe, MD  06/14/2024 10:45 AM    [facility]

## 2024-06-16 ENCOUNTER — Other Ambulatory Visit: Payer: Self-pay | Admitting: Nurse Practitioner

## 2024-06-17 NOTE — Telephone Encounter (Signed)
 Requested Prescriptions  Pending Prescriptions Disp Refills   montelukast  (SINGULAIR ) 10 MG tablet [Pharmacy Med Name: MONTELUKAST  10MG  TABLETS] 90 tablet 1    Sig: TAKE 1 TABLET(10 MG) BY MOUTH AT BEDTIME     Pulmonology:  Leukotriene Inhibitors Passed - 06/17/2024  1:59 PM      Passed - Valid encounter within last 12 months    Recent Outpatient Visits           4 months ago Ventricular tachycardia [facility])   [facility] Valerio Moris T, NP       Future Appointments             In 4 weeks Fernand Denyse LABOR, MD [facility]   In 1 month [place], Moris DASEN, NP [facility]

## 2024-06-22 ENCOUNTER — Other Ambulatory Visit (INDEPENDENT_AMBULATORY_CARE_PROVIDER_SITE_OTHER)

## 2024-06-22 DIAGNOSIS — I361 Nonrheumatic tricuspid (valve) insufficiency: Secondary | ICD-10-CM

## 2024-06-22 DIAGNOSIS — I340 Nonrheumatic mitral (valve) insufficiency: Secondary | ICD-10-CM

## 2024-06-22 DIAGNOSIS — I10 Essential (primary) hypertension: Secondary | ICD-10-CM

## 2024-06-22 DIAGNOSIS — I2510 Atherosclerotic heart disease of native coronary artery without angina pectoris: Secondary | ICD-10-CM

## 2024-06-22 DIAGNOSIS — I472 Ventricular tachycardia, unspecified: Secondary | ICD-10-CM

## 2024-06-22 DIAGNOSIS — R001 Bradycardia, unspecified: Secondary | ICD-10-CM

## 2024-06-22 DIAGNOSIS — R0602 Shortness of breath: Secondary | ICD-10-CM

## 2024-06-22 DIAGNOSIS — R609 Edema, unspecified: Secondary | ICD-10-CM

## 2024-06-22 DIAGNOSIS — E7800 Pure hypercholesterolemia, unspecified: Secondary | ICD-10-CM

## 2024-06-28 ENCOUNTER — Ambulatory Visit

## 2024-06-28 DIAGNOSIS — I10 Essential (primary) hypertension: Secondary | ICD-10-CM

## 2024-06-28 DIAGNOSIS — E7800 Pure hypercholesterolemia, unspecified: Secondary | ICD-10-CM

## 2024-06-28 DIAGNOSIS — R001 Bradycardia, unspecified: Secondary | ICD-10-CM | POA: Diagnosis not present

## 2024-06-28 DIAGNOSIS — I472 Ventricular tachycardia, unspecified: Secondary | ICD-10-CM

## 2024-06-28 DIAGNOSIS — R0602 Shortness of breath: Secondary | ICD-10-CM | POA: Diagnosis not present

## 2024-06-28 DIAGNOSIS — R609 Edema, unspecified: Secondary | ICD-10-CM

## 2024-06-28 DIAGNOSIS — I2510 Atherosclerotic heart disease of native coronary artery without angina pectoris: Secondary | ICD-10-CM | POA: Diagnosis not present

## 2024-07-01 ENCOUNTER — Other Ambulatory Visit: Payer: Self-pay | Admitting: Cardiovascular Disease

## 2024-07-15 ENCOUNTER — Ambulatory Visit: Admitting: Cardiovascular Disease

## 2024-07-15 ENCOUNTER — Encounter: Payer: Self-pay | Admitting: Cardiovascular Disease

## 2024-07-15 VITALS — BP 102/62 | HR 74 | Ht 71.0 in | Wt 192.6 lb

## 2024-07-15 DIAGNOSIS — I10 Essential (primary) hypertension: Secondary | ICD-10-CM | POA: Diagnosis not present

## 2024-07-15 DIAGNOSIS — E7800 Pure hypercholesterolemia, unspecified: Secondary | ICD-10-CM

## 2024-07-15 DIAGNOSIS — I2510 Atherosclerotic heart disease of native coronary artery without angina pectoris: Secondary | ICD-10-CM | POA: Diagnosis not present

## 2024-07-15 DIAGNOSIS — R0602 Shortness of breath: Secondary | ICD-10-CM | POA: Diagnosis not present

## 2024-07-15 DIAGNOSIS — I472 Ventricular tachycardia, unspecified: Secondary | ICD-10-CM | POA: Diagnosis not present

## 2024-07-15 DIAGNOSIS — I2583 Coronary atherosclerosis due to lipid rich plaque: Secondary | ICD-10-CM

## 2024-07-15 MED ORDER — METOPROLOL SUCCINATE ER 50 MG PO TB24: 50.0000 mg | ORAL_TABLET | Freq: Every day | ORAL | 11 refills | Status: AC

## 2024-07-15 NOTE — Progress Notes (Signed)
 Cardiology Office Note   Date:  07/15/2024   ID:  Shawn DELENA Floor Sr., DOB 25-Feb-1946, MRN [id number]  PCP:  Valerio Melanie DASEN, NP  Cardiologist:  Denyse Bathe, MD      History of Present Illness: Shawn DELENA Floor Sr. is a 78 y.o. male who presents for  Chief Complaint  Patient presents with   Follow-up    Echo and nst results.    SOB occasionally.      Past Medical History:  Diagnosis Date   Allergy 1967   Arthritis    Asthma 2022   Eosinophilic Esophagitis   Basal cell carcinoma 1999   basal cell/skin   CAD (coronary artery disease)    Cataract Cataract surgery 2017   GERD (gastroesophageal reflux disease)    History of bone marrow biopsy    History of kidney stones 2013   passed stone on his own   Hyperlipidemia    Hypertension    Leukopenia    Shingles    Ventricular tachycardia [facility])      Past Surgical History:  Procedure Laterality Date   CARDIAC CATHETERIZATION N/A 01/16/2016   Procedure: Left Heart Cath and Coronary Angiography;  Surgeon: Denyse DELENA Bathe, MD;  Location: [facility] INVASIVE CV LAB;  Service: Cardiovascular;  Laterality: N/A;   CATARACT EXTRACTION Left 2017   COLONOSCOPY WITH PROPOFOL N/A 03/27/2020   Procedure: COLONOSCOPY WITH PROPOFOL;  Surgeon: Jinny Carmine, MD;  Location: [facility];  Service: Endoscopy;  Laterality: N/A;  priority 4   CORONARY ANGIOPLASTY  2000 and 2003   ESOPHAGOGASTRODUODENOSCOPY (EGD) WITH PROPOFOL N/A 04/01/2022   Procedure: ESOPHAGOGASTRODUODENOSCOPY (EGD) WITH PROPOFOL;  Surgeon: Therisa Bi, MD;  Location: [facility] ENDOSCOPY;  Service: Gastroenterology;  Laterality: N/A;   EYE SURGERY  2017   Cateract   EYE SURGERY Left 04/09/2022   GANGLION CYST EXCISION  1979   HAND SURGERY Right 02/08/2021   blood clot removal per patient   KNEE ARTHROSCOPY WITH MEDIAL MENISECTOMY Left 12/01/2017   Procedure: KNEE ARTHROSCOPY WITH MEDIAL MENISECTOMY;  Surgeon: Leora Lynwood SAUNDERS, MD;  Location: [facility] ORS;  Service:  Orthopedics;  Laterality: Left;  Partial   LEFT HEART CATH Right 08/07/2017   Procedure: Left Heart Cath;  Surgeon: Bathe Denyse DELENA, MD;  Location: [facility] INVASIVE CV LAB;  Service: Cardiovascular;  Laterality: Right;   PILONIDAL CYST EXCISION  1977   POLYPECTOMY  03/27/2020   Procedure: POLYPECTOMY;  Surgeon: Jinny Carmine, MD;  Location: [facility];  Service: Endoscopy;;   SKIN CANCER EXCISION  1999   ear   SYNOVECTOMY Left 12/01/2017   Procedure: SYNOVECTOMY;  Surgeon: Leora Lynwood SAUNDERS, MD;  Location: [facility] ORS;  Service: Orthopedics;  Laterality: Left;  Partial   V TACH ABLATION N/A 08/14/2017   Procedure: V Tach Ablation;  Surgeon: Inocencio Soyla Lunger, MD;  Location: [facility] INVASIVE CV LAB;  Service: Cardiovascular;  Laterality: N/A;   VEIN SURGERY  2000   laser     Current Outpatient Medications  Medication Sig Dispense Refill   acetaminophen (TYLENOL) 500 MG tablet Take 1,000 mg by mouth every 6 (six) hours as needed for moderate pain or headache.     ALLERGY RELIEF 180 MG tablet TAKE 1 TABLET BY MOUTH EVERY DAY 90 tablet 1   aspirin EC 81 MG tablet Take 81 mg by mouth daily.     atorvastatin (LIPITOR) 80 MG tablet TAKE 1 TABLET(80 MG) BY MOUTH AT BEDTIME 90 tablet 1   Cholecalciferol (VITAMIN D3) 50  MCG (2000 UT) CHEW Chew 1 tablet by mouth daily.     flecainide (TAMBOCOR) 100 MG tablet Take 2 tablets (200 mg total) by mouth as needed. As needed for recurrent tachycardia.  Do not repeat more than once per day or 3x per wk 2 tablet 3   furosemide (LASIX) 40 MG tablet Take 1 tablet (40 mg total) by mouth daily. 30 tablet 11   metoprolol succinate (TOPROL XL) 50 MG 24 hr tablet Take 1 tablet (50 mg total) by mouth daily. Take with or immediately following a meal. 30 tablet 11   montelukast (SINGULAIR) 10 MG tablet TAKE 1 TABLET(10 MG) BY MOUTH AT BEDTIME 90 tablet 1   niacin (NIASPAN) 1000 MG CR tablet TAKE 1 TABLET(1000 MG) BY MOUTH AT BEDTIME 90 tablet 3   pantoprazole (PROTONIX)  40 MG tablet TAKE 1 TABLET(40 MG) BY MOUTH DAILY 90 tablet 1   potassium chloride (KLOR-CON M) 10 MEQ tablet Take 1 tablet (10 mEq total) by mouth daily. 30 tablet 2   ramipril (ALTACE) 10 MG capsule TAKE 1 CAPSULE BY MOUTH DAILY 90 capsule 0   vitamin B-12 (CYANOCOBALAMIN) 1000 MCG tablet Take 1 tablet (1,000 mcg total) by mouth daily. 90 tablet 0   No current facility-administered medications for this visit.    Allergies:   Egg-derived products and Peanut (diagnostic)    Social History:   reports that he quit smoking about 53 years ago. His smoking use included cigarettes. He started smoking about 58 years ago. He has a 5 pack-year smoking history. He quit smokeless tobacco use about 44 years ago.  His smokeless tobacco use included chew. He reports that he does not currently use alcohol. He reports that he does not use drugs.   Family History:  family history includes Alzheimer's disease in his brother and father; Brain cancer (age of onset: 46) in his daughter; Cancer in his daughter, maternal grandfather, and maternal uncle; Congestive Heart Failure in his brother; Diabetes in his mother; Heart attack in his mother; Heart disease in his brother, mother, and son; Hypertension in his brother and mother; Other in his brother.    ROS:     Review of Systems  Constitutional: Negative.   HENT: Negative.    Eyes: Negative.   Respiratory: Negative.    Gastrointestinal: Negative.   Genitourinary: Negative.   Musculoskeletal: Negative.   Skin: Negative.   Neurological: Negative.   Endo/Heme/Allergies: Negative.   Psychiatric/Behavioral: Negative.    All other systems reviewed and are negative.     All other systems are reviewed and negative.    PHYSICAL EXAM: VS:  BP 102/62   Pulse 74   Ht 5' 11 (1.803 m)   Wt 192 lb 9.6 oz (87.4 kg)   SpO2 96%   BMI 26.86 kg/m  , BMI Body mass index is 26.86 kg/m. Last weight:  Wt Readings from Last 3 Encounters:  07/15/24 192 lb 9.6 oz  (87.4 kg)  06/14/24 194 lb 6.4 oz (88.2 kg)  03/18/24 194 lb 11.2 oz (88.3 kg)     Physical Exam Vitals reviewed.  Constitutional:      Appearance: Normal appearance. He is normal weight.  HENT:     Head: Normocephalic.     Nose: Nose normal.     Mouth/Throat:     Mouth: Mucous membranes are moist.  Eyes:     Pupils: Pupils are equal, round, and reactive to light.  Cardiovascular:     Rate and Rhythm: Normal rate  and regular rhythm.     Pulses: Normal pulses.     Heart sounds: Normal heart sounds.  Pulmonary:     Effort: Pulmonary effort is normal.  Abdominal:     General: Abdomen is flat. Bowel sounds are normal.  Musculoskeletal:        General: Normal range of motion.     Cervical back: Normal range of motion.  Skin:    General: Skin is warm.  Neurological:     General: No focal deficit present.     Mental Status: He is alert.  Psychiatric:        Mood and Affect: Mood normal.       EKG:   Recent Labs: 02/03/2024: Magnesium 2.0; TSH 0.733 03/18/2024: ALT 19; BUN 13; Creatinine 0.90; Hemoglobin 13.3; Platelet Count 148; Potassium 4.4; Sodium 136    Lipid Panel    Component Value Date/Time   CHOL 122 02/03/2024 1411   CHOL 136 05/31/2019 0852   TRIG 68 02/03/2024 1411   TRIG 87 05/31/2019 0852   HDL 56 02/03/2024 1411   CHOLHDL 2.2 11/09/2018 1005   VLDL 17 05/31/2019 0852   LDLCALC 52 02/03/2024 1411      Other studies Reviewed: Additional studies/ records that were reviewed today include:  Review of the above records demonstrates:       No data to display            ASSESSMENT AND PLAN:    ICD-10-CM   1. SOB (shortness of breath)  R06.02 metoprolol succinate (TOPROL XL) 50 MG 24 hr tablet   stress test has no ischaemia, fixed defect inferior and apical defect. Normal EF.    2. Ventricular tachycardia (HCC)  I47.20 metoprolol succinate (TOPROL XL) 50 MG 24 hr tablet   Hass skip beats, thus try metoprolol 50.    3. Essential hypertension   I10 metoprolol succinate (TOPROL XL) 50 MG 24 hr tablet    4. Coronary artery disease due to lipid rich plaque  I25.10 metoprolol succinate (TOPROL XL) 50 MG 24 hr tablet   I25.83     5. Pure hypercholesterolemia  E78.00 metoprolol succinate (TOPROL XL) 50 MG 24 hr tablet       Problem List Items Addressed This Visit       Cardiovascular and Mediastinum   CAD (coronary artery disease)   Relevant Medications   metoprolol succinate (TOPROL XL) 50 MG 24 hr tablet   Essential hypertension   Relevant Medications   metoprolol succinate (TOPROL XL) 50 MG 24 hr tablet   Ventricular tachycardia (HCC)   Relevant Medications   metoprolol succinate (TOPROL XL) 50 MG 24 hr tablet     Other   Hyperlipidemia   Relevant Medications   metoprolol succinate (TOPROL XL) 50 MG 24 hr tablet   Other Visit Diagnoses       SOB (shortness of breath)    -  Primary   stress test has no ischaemia, fixed defect inferior and apical defect. Normal EF.   Relevant Medications   metoprolol succinate (TOPROL XL) 50 MG 24 hr tablet          Disposition:   Return in about 4 weeks (around 08/12/2024).    Total time spent: 30 minutes  Signed,  Denyse Bathe, MD  07/15/2024 10:45 AM    [facility]

## 2024-08-07 ENCOUNTER — Other Ambulatory Visit: Payer: Self-pay | Admitting: Cardiovascular Disease

## 2024-08-07 DIAGNOSIS — I2510 Atherosclerotic heart disease of native coronary artery without angina pectoris: Secondary | ICD-10-CM

## 2024-08-07 DIAGNOSIS — I472 Ventricular tachycardia, unspecified: Secondary | ICD-10-CM

## 2024-08-07 DIAGNOSIS — R001 Bradycardia, unspecified: Secondary | ICD-10-CM

## 2024-08-07 DIAGNOSIS — E7800 Pure hypercholesterolemia, unspecified: Secondary | ICD-10-CM

## 2024-08-07 DIAGNOSIS — I10 Essential (primary) hypertension: Secondary | ICD-10-CM

## 2024-08-08 DIAGNOSIS — R7309 Other abnormal glucose: Secondary | ICD-10-CM | POA: Insufficient documentation

## 2024-08-08 NOTE — Patient Instructions (Incomplete)
 Be Involved in Caring For Your Health:  Taking Medications When medications are taken as directed, they can greatly improve your health. But if they are not taken as prescribed, they may not work. In some cases, not taking them correctly can be harmful. To help ensure your treatment remains effective and safe, understand your medications and how to take them. Bring your medications to each visit for review by your provider.  Your lab results, notes, and after visit summary will be available on My Chart. We strongly encourage you to use this feature. If lab results are abnormal the clinic will contact you with the appropriate steps. If the clinic does not contact you assume the results are satisfactory. You can always view your results on My Chart. If you have questions regarding your health or results, please contact the clinic during office hours. You can also ask questions on My Chart.  We at [facility] are grateful that you chose [place] to provide your care. We strive to provide evidence-based and compassionate care and are always looking for feedback. If you get a survey from the clinic please complete this so we can hear your opinions.  Heart-Healthy Eating Plan Many factors influence your heart health, including eating and exercise habits. Heart health is also called coronary health. Coronary risk increases with abnormal blood fat (lipid) levels. A heart-healthy eating plan includes limiting unhealthy fats, increasing healthy fats, limiting salt (sodium) intake, and making other diet and lifestyle changes. What is my plan? Your health care provider may recommend that: You limit your fat intake to _________% or less of your total calories each day. You limit your saturated fat intake to _________% or less of your total calories each day. You limit the amount of cholesterol in your diet to less than _________ mg per day. You limit the amount of sodium in your diet to less than _________  mg per day. What are tips for following this plan? Cooking Cook foods using methods other than frying. Baking, boiling, grilling, and broiling are all good options. Other ways to reduce fat include: Removing the skin from poultry. Removing all visible fats from meats. Steaming vegetables in water or broth. Meal planning  At meals, imagine dividing your plate into fourths: Fill one-half of your plate with vegetables and green salads. Fill one-fourth of your plate with whole grains. Fill one-fourth of your plate with lean protein foods. Eat 2-4 cups of vegetables per day. One cup of vegetables equals 1 cup (91 g) broccoli or cauliflower florets, 2 medium carrots, 1 large bell pepper, 1 large sweet potato, 1 large tomato, 1 medium white potato, 2 cups (150 g) raw leafy greens. Eat 1-2 cups of fruit per day. One cup of fruit equals 1 small apple, 1 large banana, 1 cup (237 g) mixed fruit, 1 large orange,  cup (82 g) dried fruit, 1 cup (240 mL) 100% fruit juice. Eat more foods that contain soluble fiber. Examples include apples, broccoli, carrots, beans, peas, and barley. Aim to get 25-30 g of fiber per day. Increase your consumption of legumes, nuts, and seeds to 4-5 servings per week. One serving of dried beans or legumes equals  cup (90 g) cooked, 1 serving of nuts is  oz (12 almonds, 24 pistachios, or 7 walnut halves), and 1 serving of seeds equals  oz (8 g). Fats Choose healthy fats more often. Choose monounsaturated and polyunsaturated fats, such as olive and canola oils, avocado oil, flaxseeds, walnuts, almonds, and seeds. Eat  more omega-3 fats. Choose salmon, mackerel, sardines, tuna, flaxseed oil, and ground flaxseeds. Aim to eat fish at least 2 times each week. Check food labels carefully to identify foods with trans fats or high amounts of saturated fat. Limit saturated fats. These are found in animal products, such as meats, butter, and cream. Plant sources of saturated fats  include palm oil, palm kernel oil, and coconut oil. Avoid foods with partially hydrogenated oils in them. These contain trans fats. Examples are stick margarine, some tub margarines, cookies, crackers, and other baked goods. Avoid fried foods. General information Eat more home-cooked food and less restaurant, buffet, and fast food. Limit or avoid alcohol. Limit foods that are high in added sugar and simple starches such as foods made using white refined flour (white breads, pastries, sweets). Lose weight if you are overweight. Losing just 5-10% of your body weight can help your overall health and prevent diseases such as diabetes and heart disease. Monitor your sodium intake, especially if you have high blood pressure. Talk with your health care provider about your sodium intake. Try to incorporate more vegetarian meals weekly. What foods should I eat? Fruits All fresh, canned (in natural juice), or frozen fruits. Vegetables Fresh or frozen vegetables (raw, steamed, roasted, or grilled). Green salads. Grains Most grains. Choose whole wheat and whole grains most of the time. Rice and pasta, including brown rice and pastas made with whole wheat. Meats and other proteins Lean, well-trimmed beef, veal, pork, and lamb. Chicken and [place] without skin. All fish and shellfish. Wild duck, rabbit, pheasant, and venison. Egg whites or low-cholesterol egg substitutes. Dried beans, peas, lentils, and tofu. Seeds and most nuts. Dairy Low-fat or nonfat cheeses, including ricotta and mozzarella. Skim or 1% milk (liquid, powdered, or evaporated). Buttermilk made with low-fat milk. Nonfat or low-fat yogurt. Fats and oils Non-hydrogenated (trans-free) margarines. Vegetable oils, including soybean, sesame, sunflower, olive, avocado, peanut, safflower, corn, canola, and cottonseed. Salad dressings or mayonnaise made with a vegetable oil. Beverages Water (mineral or sparkling). Coffee and tea. Unsweetened ice  tea. Diet beverages. Sweets and desserts Sherbet, gelatin, and fruit ice. Small amounts of dark chocolate. Limit all sweets and desserts. Seasonings and condiments All seasonings and condiments. The items listed above may not be a complete list of foods and beverages you can eat. Contact a dietitian for more options. What foods should I avoid? Fruits Canned fruit in heavy syrup. Fruit in cream or butter sauce. Fried fruit. Limit coconut. Vegetables Vegetables cooked in cheese, cream, or butter sauce. Fried vegetables. Grains Breads made with saturated or trans fats, oils, or whole milk. Croissants. Sweet rolls. Donuts. High-fat crackers, such as cheese crackers and chips. Meats and other proteins Fatty meats, such as hot dogs, ribs, sausage, bacon, rib-eye roast or steak. High-fat deli meats, such as salami and bologna. Caviar. Domestic duck and goose. Organ meats, such as liver. Dairy Cream, sour cream, cream cheese, and creamed cottage cheese. Whole-milk cheeses. Whole or 2% milk (liquid, evaporated, or condensed). Whole buttermilk. Cream sauce or high-fat cheese sauce. Whole-milk yogurt. Fats and oils Meat fat, or shortening. Cocoa butter, hydrogenated oils, palm oil, coconut oil, palm kernel oil. Solid fats and shortenings, including bacon fat, salt pork, lard, and butter. Nondairy cream substitutes. Salad dressings with cheese or sour cream. Beverages Regular sodas and any drinks with added sugar. Sweets and desserts Frosting. Pudding. Cookies. Cakes. Pies. Milk chocolate or white chocolate. Buttered syrups. Full-fat ice cream or ice cream drinks. The items listed above may  not be a complete list of foods and beverages to avoid. Contact a dietitian for more information. Summary Heart-healthy meal planning includes limiting unhealthy fats, increasing healthy fats, limiting salt (sodium) intake and making other diet and lifestyle changes. Lose weight if you are overweight. Losing just  5-10% of your body weight can help your overall health and prevent diseases such as diabetes and heart disease. Focus on eating a balance of foods, including fruits and vegetables, low-fat or nonfat dairy, lean protein, nuts and legumes, whole grains, and heart-healthy oils and fats. This information is not intended to replace advice given to you by your health care provider. Make sure you discuss any questions you have with your health care provider. Document Revised: 12/24/2021 Document Reviewed: 12/24/2021 Elsevier Patient Education  2024 [employer].

## 2024-08-12 ENCOUNTER — Ambulatory Visit: Payer: Self-pay | Admitting: Nurse Practitioner

## 2024-08-12 ENCOUNTER — Encounter: Payer: Self-pay | Admitting: Nurse Practitioner

## 2024-08-12 ENCOUNTER — Ambulatory Visit: Admitting: Nurse Practitioner

## 2024-08-12 ENCOUNTER — Encounter: Payer: Self-pay | Admitting: Cardiovascular Disease

## 2024-08-12 ENCOUNTER — Ambulatory Visit: Admitting: Cardiovascular Disease

## 2024-08-12 VITALS — BP 104/67 | HR 67 | Temp 98.4°F | Resp 15 | Ht 70.98 in | Wt 193.2 lb

## 2024-08-12 VITALS — BP 112/68 | HR 67 | Ht 71.0 in | Wt 193.2 lb

## 2024-08-12 DIAGNOSIS — I472 Ventricular tachycardia, unspecified: Secondary | ICD-10-CM

## 2024-08-12 DIAGNOSIS — R0602 Shortness of breath: Secondary | ICD-10-CM | POA: Diagnosis not present

## 2024-08-12 DIAGNOSIS — I2510 Atherosclerotic heart disease of native coronary artery without angina pectoris: Secondary | ICD-10-CM | POA: Diagnosis not present

## 2024-08-12 DIAGNOSIS — D696 Thrombocytopenia, unspecified: Secondary | ICD-10-CM | POA: Diagnosis not present

## 2024-08-12 DIAGNOSIS — E7800 Pure hypercholesterolemia, unspecified: Secondary | ICD-10-CM | POA: Diagnosis not present

## 2024-08-12 DIAGNOSIS — I2583 Coronary atherosclerosis due to lipid rich plaque: Secondary | ICD-10-CM

## 2024-08-12 DIAGNOSIS — I10 Essential (primary) hypertension: Secondary | ICD-10-CM | POA: Diagnosis not present

## 2024-08-12 DIAGNOSIS — R7309 Other abnormal glucose: Secondary | ICD-10-CM | POA: Diagnosis not present

## 2024-08-12 DIAGNOSIS — M810 Age-related osteoporosis without current pathological fracture: Secondary | ICD-10-CM

## 2024-08-12 DIAGNOSIS — Z23 Encounter for immunization: Secondary | ICD-10-CM | POA: Diagnosis not present

## 2024-08-12 DIAGNOSIS — N401 Enlarged prostate with lower urinary tract symptoms: Secondary | ICD-10-CM

## 2024-08-12 DIAGNOSIS — K200 Eosinophilic esophagitis: Secondary | ICD-10-CM

## 2024-08-12 DIAGNOSIS — D72820 Lymphocytosis (symptomatic): Secondary | ICD-10-CM

## 2024-08-12 DIAGNOSIS — R001 Bradycardia, unspecified: Secondary | ICD-10-CM

## 2024-08-12 LAB — BAYER DCA HB A1C WAIVED: HB A1C (BAYER DCA - WAIVED): 6 % — ABNORMAL HIGH (ref 4.8–5.6)

## 2024-08-12 MED ORDER — COVID-19 MRNA VAC-TRIS(PFIZER) 30 MCG/0.3ML IM SUSY: 0.3000 mL | PREFILLED_SYRINGE | Freq: Once | INTRAMUSCULAR | 0 refills | Status: AC

## 2024-08-12 NOTE — Assessment & Plan Note (Signed)
 Having symptoms, PSA and prostate exam reassuring.  Will hold off on medication at this time, but consider starting Flomax in future.  Discussed with patient today.  Recommend at this time working on more regular bowel regimen.  Add Metamucil gummies daily to regimen.

## 2024-08-12 NOTE — Assessment & Plan Note (Signed)
 Chronic, ongoing.  Continue collaboration with cardiology and medications as ordered by them.  Recent notes reviewed.

## 2024-08-12 NOTE — Assessment & Plan Note (Addendum)
 Chronic, ongoing.  Recommend continue Vitamin D  supplement and calcium  daily.  Educated on this.  Obtain DEXA, discussed with patient. He will schedule.  Would avoid Fosamax  for him due to his esophagitis, suspect oral medications may exacerbate this.  May benefit referral to endo in future, unsure Prolia would be beneficial due to current thrombocytopenia.

## 2024-08-12 NOTE — Assessment & Plan Note (Signed)
Chronic, diagnosed 04/01/22 by Dr. Vicente Males.  At this time continue collaboration with GI as needed and ENT + continue current medication regimen.  Recent notes reviewed.

## 2024-08-12 NOTE — Assessment & Plan Note (Signed)
 Chronic, ongoing.  Continue current medication regimen and adjust as needed. Lipid panel today.

## 2024-08-12 NOTE — Assessment & Plan Note (Signed)
 Chronic, stable with BP remaining well below goal in office.  Recommend he monitor BP at least a few mornings a week at home and document.  DASH diet at home.  Continue current medication regimen and adjust as needed.  Labs today: CMP.  Return in 6 months.

## 2024-08-12 NOTE — Assessment & Plan Note (Signed)
Chronic, stable.  Continue collaboration with hematology, recent labs and note reviewed.

## 2024-08-12 NOTE — Progress Notes (Signed)
 Cardiology Office Note   Date:  08/12/2024   ID:  Shawn DELENA Floor Sr., DOB June 29, 1946, MRN [id number]  PCP:  Valerio Melanie DASEN, NP  Cardiologist:  Denyse Bathe, MD      History of Present Illness: Shawn DELENA Floor Sr. is a 78 y.o. male who presents for  Chief Complaint  Patient presents with   Follow-up    4 week follow up    SOB resolved.      Past Medical History:  Diagnosis Date   Allergy  1967   Arthritis    Asthma 2022   Eosinophilic Esophagitis   Basal cell carcinoma 1999   basal cell/skin   CAD (coronary artery disease)    Cataract Cataract surgery 2017   GERD (gastroesophageal reflux disease)    History of bone marrow biopsy    History of kidney stones 2013   passed stone on his own   Hyperlipidemia    Hypertension    Leukopenia    Shingles    Ventricular tachycardia [facility])      Past Surgical History:  Procedure Laterality Date   CARDIAC CATHETERIZATION N/A 01/16/2016   Procedure: Left Heart Cath and Coronary Angiography;  Surgeon: Denyse DELENA Bathe, MD;  Location: [facility] INVASIVE CV LAB;  Service: Cardiovascular;  Laterality: N/A;   CATARACT EXTRACTION Left 2017   COLONOSCOPY WITH PROPOFOL  N/A 03/27/2020   Procedure: COLONOSCOPY WITH PROPOFOL ;  Surgeon: Jinny Carmine, MD;  Location: [facility] SURGERY CNTR;  Service: Endoscopy;  Laterality: N/A;  priority 4   CORONARY ANGIOPLASTY  2000 and 2003   ESOPHAGOGASTRODUODENOSCOPY (EGD) WITH PROPOFOL  N/A 04/01/2022   Procedure: ESOPHAGOGASTRODUODENOSCOPY (EGD) WITH PROPOFOL ;  Surgeon: Therisa Bi, MD;  Location: [facility] ENDOSCOPY;  Service: Gastroenterology;  Laterality: N/A;   EYE SURGERY  2017   Cateract   EYE SURGERY Left 04/09/2022   GANGLION CYST EXCISION  1979   HAND SURGERY Right 02/08/2021   blood clot removal per patient   KNEE ARTHROSCOPY WITH MEDIAL MENISECTOMY Left 12/01/2017   Procedure: KNEE ARTHROSCOPY WITH MEDIAL MENISECTOMY;  Surgeon: Leora Lynwood SAUNDERS, MD;  Location: [facility] ORS;  Service:  Orthopedics;  Laterality: Left;  Partial   LEFT HEART CATH Right 08/07/2017   Procedure: Left Heart Cath;  Surgeon: Bathe Denyse DELENA, MD;  Location: [facility] INVASIVE CV LAB;  Service: Cardiovascular;  Laterality: Right;   PILONIDAL CYST EXCISION  1977   POLYPECTOMY  03/27/2020   Procedure: POLYPECTOMY;  Surgeon: Jinny Carmine, MD;  Location: [facility] SURGERY [facility]TR;  Service: Endoscopy;;   SKIN CANCER EXCISION  1999   ear   SYNOVECTOMY Left 12/01/2017   Procedure: SYNOVECTOMY;  Surgeon: Leora Lynwood SAUNDERS, MD;  Location: [facility] ORS;  Service: Orthopedics;  Laterality: Left;  Partial   V TACH ABLATION N/A 08/14/2017   Procedure: V Tach Ablation;  Surgeon: Inocencio Soyla Lunger, MD;  Location: [facility] INVASIVE CV LAB;  Service: Cardiovascular;  Laterality: N/A;   VEIN SURGERY  2000   laser     Current Outpatient Medications  Medication Sig Dispense Refill   acetaminophen  (TYLENOL ) 500 MG tablet Take 1,000 mg by mouth every 6 (six) hours as needed for moderate pain or headache.     ALLERGY  RELIEF 180 MG tablet TAKE 1 TABLET BY MOUTH EVERY DAY 90 tablet 1   aspirin  EC 81 MG tablet Take 81 mg by mouth daily.     atorvastatin  (LIPITOR ) 80 MG tablet TAKE 1 TABLET(80 MG) BY MOUTH AT BEDTIME 90 tablet 1   Cholecalciferol (VITAMIN D3) 50  MCG (2000 UT) CHEW Chew 1 tablet by mouth daily.     flecainide  (TAMBOCOR ) 100 MG tablet Take 2 tablets (200 mg total) by mouth as needed. As needed for recurrent tachycardia.  Do not repeat more than once per day or 3x per wk 2 tablet 3   furosemide  (LASIX ) 40 MG tablet Take 1 tablet (40 mg total) by mouth daily. 30 tablet 11   metoprolol  succinate (TOPROL  XL) 50 MG 24 hr tablet Take 1 tablet (50 mg total) by mouth daily. Take with or immediately following a meal. 30 tablet 11   montelukast  (SINGULAIR ) 10 MG tablet TAKE 1 TABLET(10 MG) BY MOUTH AT BEDTIME 90 tablet 1   niacin  (NIASPAN ) 1000 MG CR tablet TAKE 1 TABLET(1000 MG) BY MOUTH AT BEDTIME 90 tablet 3   pantoprazole  (PROTONIX )  40 MG tablet TAKE 1 TABLET(40 MG) BY MOUTH DAILY 90 tablet 1   potassium chloride  (KLOR-CON  M) 10 MEQ tablet Take 1 tablet (10 mEq total) by mouth daily. 30 tablet 2   ramipril  (ALTACE ) 10 MG capsule TAKE 1 CAPSULE BY MOUTH DAILY 90 capsule 0   vitamin B-12 (CYANOCOBALAMIN ) 1000 MCG tablet Take 1 tablet (1,000 mcg total) by mouth daily. 90 tablet 0   COVID-19 mRNA vaccine, Pfizer, (COMIRNATY) syringe Inject 0.3 mLs into the muscle once for 1 dose. 0.3 mL 0   No current facility-administered medications for this visit.    Allergies:   Egg-derived products and Peanut (diagnostic)    Social History:   reports that he quit smoking about 53 years ago. His smoking use included cigarettes. He started smoking about 58 years ago. He has a 5 pack-year smoking history. He quit smokeless tobacco use about 44 years ago.  His smokeless tobacco use included chew. He reports that he does not currently use alcohol. He reports that he does not use drugs.   Family History:  family history includes Alzheimer's disease in his brother and father; Brain cancer (age of onset: 58) in his daughter; Cancer in his daughter, maternal grandfather, and maternal uncle; Congestive Heart Failure in his brother; Diabetes in his mother; Heart attack in his mother; Heart disease in his brother, mother, and son; Hypertension in his brother and mother; Other in his brother.    ROS:     Review of Systems  Constitutional: Negative.   HENT: Negative.    Eyes: Negative.   Respiratory: Negative.    Gastrointestinal: Negative.   Genitourinary: Negative.   Musculoskeletal: Negative.   Skin: Negative.   Neurological: Negative.   Endo/Heme/Allergies: Negative.   Psychiatric/Behavioral: Negative.    All other systems reviewed and are negative.     All other systems are reviewed and negative.    PHYSICAL EXAM: VS:  BP 112/68   Pulse 67   Ht 5' 11 (1.803 m)   Wt 193 lb 3.2 oz (87.6 kg)   SpO2 97%   BMI 26.95 kg/m  , BMI  Body mass index is 26.95 kg/m. Last weight:  Wt Readings from Last 3 Encounters:  08/12/24 193 lb 3.2 oz (87.6 kg)  08/12/24 193 lb 3.2 oz (87.6 kg)  07/15/24 192 lb 9.6 oz (87.4 kg)     Physical Exam Vitals reviewed.  Constitutional:      Appearance: Normal appearance. He is normal weight.  HENT:     Head: Normocephalic.     Nose: Nose normal.     Mouth/Throat:     Mouth: Mucous membranes are moist.  Eyes:  Pupils: Pupils are equal, round, and reactive to light.  Cardiovascular:     Rate and Rhythm: Normal rate and regular rhythm.     Pulses: Normal pulses.     Heart sounds: Normal heart sounds.  Pulmonary:     Effort: Pulmonary effort is normal.  Abdominal:     General: Abdomen is flat. Bowel sounds are normal.  Musculoskeletal:        General: Normal range of motion.     Cervical back: Normal range of motion.  Skin:    General: Skin is warm.  Neurological:     General: No focal deficit present.     Mental Status: He is alert.  Psychiatric:        Mood and Affect: Mood normal.       EKG:   Recent Labs: 02/03/2024: Magnesium  2.0; TSH 0.733 03/18/2024: ALT 19; BUN 13; Creatinine 0.90; Hemoglobin 13.3; Platelet Count 148; Potassium 4.4; Sodium 136    Lipid Panel    Component Value Date/Time   CHOL 122 02/03/2024 1411   CHOL 136 05/31/2019 0852   TRIG 68 02/03/2024 1411   TRIG 87 05/31/2019 0852   HDL 56 02/03/2024 1411   CHOLHDL 2.2 11/09/2018 1005   VLDL 17 05/31/2019 0852   LDLCALC 52 02/03/2024 1411      Other studies Reviewed: Additional studies/ records that were reviewed today include:  Review of the above records demonstrates:       No data to display            ASSESSMENT AND PLAN:    ICD-10-CM   1. Coronary artery disease due to lipid rich plaque  I25.10    I25.83     2. Essential hypertension  I10     3. Ventricular tachycardia (HCC)  I47.20     4. Bradycardia  R00.1     5. SOB (shortness of breath)  R06.02         Problem List Items Addressed This Visit       Cardiovascular and Mediastinum   CAD (coronary artery disease) - Primary   Essential hypertension   Ventricular tachycardia (HCC)     Other   Bradycardia (Chronic)   Other Visit Diagnoses       SOB (shortness of breath)              Disposition:   Return in about 3 months (around 11/11/2024).    Total time spent: 35 minutes  Signed,  Denyse Bathe, MD  08/12/2024 1:35 PM    [facility]

## 2024-08-12 NOTE — Assessment & Plan Note (Signed)
 A1c 6% today, remaining in prediabetic range.  Continue focus on healthy diet and regular exercise.

## 2024-08-12 NOTE — Assessment & Plan Note (Signed)
Follow by hematology, continue this collaboration.  Recent note reviewed.

## 2024-08-12 NOTE — Assessment & Plan Note (Signed)
Chronic, stable at this time with no recent CP. Continue current medication regimen and collaboration with cardiology. No recent NTG use.  

## 2024-08-12 NOTE — Progress Notes (Signed)
 BP 104/67 (BP Location: Left Arm, Patient Position: Sitting, Cuff Size: Large)   Pulse 67   Temp 98.4 F (36.9 C) (Oral)   Resp 15   Ht 5' 10.98 (1.803 m)   Wt 193 lb 3.2 oz (87.6 kg)   SpO2 97%   BMI 26.96 kg/m    Subjective:    Patient ID: Shawn DELENA Floor Sr., male    DOB: 11/21/1946, 78 y.o.   MRN: [id number]  HPI: Shawn CURRIER Sr. is a 78 y.o. male  Chief Complaint  Patient presents with   Hypertension    Some home checks. Ranging a little high when he does checks. Often checks when he feels his sugar as low. Mentions diastolic will be a bit high.    Hyperlipidemia   Osteoporosis    1 month chiropractic. Does wear a brace at time.    Urine flow    Mentions over the last few months that his flow has bene less. Also wonders about previously low PSA. Did have 1 quick sharp rectum pain but only one time last week. Also says that his lasix  to twice daily.    Fatigue    Mentions since his cardiology increased since his metoprolol  to 50 mg.     HYPERTENSION / HYPERLIPIDEMIA Last saw cardiology on 07/15/24, they increased Metoprolol  and Lasix . Continues to take Ramipril , Metoprolol , Lasix , Tambocor  as needed, Niacin , and Atorvastatin .  Had ablation >5 years ago for VT. Has not had to take any Tambocor .  EF on 06/22/24 was 60.8%. Having fatigue with recent medication changes.   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 Satisfied with current treatment? yes Duration of hypertension: chronic BP monitoring frequency: rarely  BP medication side effects: no Duration of hyperlipidemia: chronic Cholesterol medication side effects: no Cholesterol supplements: niacin  Medication compliance: good compliance Aspirin : yes Recent stressors: no Recurrent headaches: no Visual changes: no Palpitations: no Dyspnea: no Chest pain: no Lower extremity edema: at baseline R>L Dizzy/lightheaded: no    GERD Eosinophilic esophagitis.   Takes Flonase , Singulair , Allegra , Protonix . GERD control status: stable Satisfied with current treatment? yes Medication side effects: no  Medication compliance: stable Dysphagia: no Odynophagia:  no Hematemesis: no Blood in stool: no EGD: no    OSTEOPOROSIS Last DEXA on 08/06/22 T-score -2.6 and past DEXA in 2018 noted T-score -2.2. Takes daily supplements. No recent falls.  Is not as stable as used to be, if gets up too quickly has to hold on to something. Has not tried oral medications due to his esophagitis.   Satisfied with current treatment?: yes Adequate calcium  & vitamin D : yes Weight bearing exercises: no   BPH Over past few months has noticed issues getting stream started. PSA on 02/03/24. Does endorse constipation issues at home, takes Colace daily.  Has a BM every 2-3 days, but then will go all day (regular stool). Tries not to strain.  Does not eat a lot at fiber at present, used to eat a lot of cereal but due to GERD. BPH status: uncontrolled Duration: months Nocturia: 1-2x per night Urinary frequency:yes Incomplete voiding: yes Urgency: no Weak urinary stream: no Straining to start stream: yes Dysuria: no Onset: gradual Severity: moderate Alleviating factors: nothing Aggravating factors: Lasix  and constipation Treatments attempted: nothing IPSS Questionnaire (AUA-7): 16 AUA Over the past month.   1)  How often have you had a sensation of not emptying your bladder completely after you finish urinating?  3 - About  half the time  2)  How often have you had to urinate again less than two hours after you finished urinating? 5 - Almost always  3)  How often have you found you stopped and started again several times when you urinated?  3 - About half the time  4) How difficult have you found it to postpone urination?  0 - Not at all  5) How often have you had a weak urinary stream?  0 - Not at all  6) How often have you had to push or strain to begin urination?  3 - About  half the time  7) How many times did you most typically get up to urinate from the time you went to bed until the time you got up in the morning?  2 - 2 times  Total score:  0-7 mildly symptomatic   8-19 moderately symptomatic   20-35 severely symptomatic     Relevant past medical, surgical, family and social history reviewed and updated as indicated. Interim medical history since our last visit reviewed. Allergies and medications reviewed and updated.  Review of Systems  Constitutional:  Positive for fatigue. Negative for activity change, appetite change, diaphoresis, fever and unexpected weight change.  Respiratory:  Negative for cough, chest tightness, shortness of breath and wheezing.   Cardiovascular:  Negative for chest pain, palpitations and leg swelling.  Gastrointestinal: Negative.   Endocrine: Negative.   Genitourinary:  Positive for frequency. Negative for dysuria and urgency.  Neurological: Negative.   Psychiatric/Behavioral: Negative.      Per HPI unless specifically indicated above     Objective:    BP 104/67 (BP Location: Left Arm, Patient Position: Sitting, Cuff Size: Large)   Pulse 67   Temp 98.4 F (36.9 C) (Oral)   Resp 15   Ht 5' 10.98 (1.803 m)   Wt 193 lb 3.2 oz (87.6 kg)   SpO2 97%   BMI 26.96 kg/m   Wt Readings from Last 3 Encounters:  08/12/24 193 lb 3.2 oz (87.6 kg)  07/15/24 192 lb 9.6 oz (87.4 kg)  06/14/24 194 lb 6.4 oz (88.2 kg)    Physical Exam Vitals and nursing note reviewed. Exam conducted with a chaperone present.  Constitutional:      General: He is awake. He is not in acute distress.    Appearance: He is well-developed and well-groomed. He is not ill-appearing or toxic-appearing.  HENT:     Head: Normocephalic.     Right Ear: Hearing and external ear normal.     Left Ear: Hearing and external ear normal.  Eyes:     General: Lids are normal.     Extraocular Movements: Extraocular movements intact.     Conjunctiva/sclera:  Conjunctivae normal.  Neck:     Thyroid : No thyromegaly.     Vascular: No carotid bruit.  Cardiovascular:     Rate and Rhythm: Normal rate and regular rhythm.     Heart sounds: Normal heart sounds. No murmur heard.    No gallop.  Pulmonary:     Effort: No accessory muscle usage or respiratory distress.     Breath sounds: Normal breath sounds. No decreased breath sounds, wheezing or rales.  Abdominal:     General: Bowel sounds are normal. There is no distension.     Palpations: Abdomen is soft.     Tenderness: There is no abdominal tenderness.  Genitourinary:    Prostate: Enlarged. Not tender and no nodules present.     Comments:  Mild enlargement prostate, no nodules. Musculoskeletal:     Cervical back: Full passive range of motion without pain.     Right lower leg: No edema.     Left lower leg: No edema.  Lymphadenopathy:     Cervical: No cervical adenopathy.  Skin:    General: Skin is warm.     Capillary Refill: Capillary refill takes less than 2 seconds.  Neurological:     Mental Status: He is alert and oriented to person, place, and time.     Deep Tendon Reflexes: Reflexes are normal and symmetric.     Reflex Scores:      Brachioradialis reflexes are 2+ on the right side and 2+ on the left side.      Patellar reflexes are 2+ on the right side and 2+ on the left side. Psychiatric:        Attention and Perception: Attention normal.        Mood and Affect: Mood normal.        Speech: Speech normal.        Behavior: Behavior normal. Behavior is cooperative.        Thought Content: Thought content normal.     Results for orders placed or performed in visit on 08/12/24  Bayer DCA Hb A1c Waived   Collection Time: 08/12/24 10:48 AM  Result Value Ref Range   HB A1C (BAYER DCA - WAIVED) 6.0 (H) 4.8 - 5.6 %      Assessment & Plan:   Problem List Items Addressed This Visit       Cardiovascular and Mediastinum   Ventricular tachycardia (HCC) - Primary   Chronic, ongoing.   Continue collaboration with cardiology and medications as ordered by them.  Recent notes reviewed.      Essential hypertension   Chronic, stable with BP remaining well below goal in office.  Recommend he monitor BP at least a few mornings a week at home and document.  DASH diet at home.  Continue current medication regimen and adjust as needed.  Labs today: CMP.  Return in 6 months.       Relevant Orders   Comprehensive metabolic panel with GFR   CAD (coronary artery disease)   Chronic, stable at this time with no recent CP.  Continue current medication regimen and collaboration with cardiology.  No recent NTG use.          Digestive   Eosinophilic esophagitis   Chronic, diagnosed 04/01/22 by Dr. Therisa.  At this time continue collaboration with GI as needed and ENT + continue current medication regimen.  Recent notes reviewed.        Musculoskeletal and Integument   Osteoporosis of lumbar spine   Chronic, ongoing.  Recommend continue Vitamin D  supplement and calcium  daily.  Educated on this.  Obtain DEXA, discussed with patient. He will schedule.  Would avoid Fosamax  for him due to his esophagitis, suspect oral medications may exacerbate this.  May benefit referral to endo in future, unsure Prolia would be beneficial due to current thrombocytopenia.        Relevant Orders   DG Bone Density     Genitourinary   BPH (benign prostatic hyperplasia)   Having symptoms, PSA and prostate exam reassuring.  Will hold off on medication at this time, but consider starting Flomax in future.  Discussed with patient today.  Recommend at this time working on more regular bowel regimen.  Add Metamucil gummies daily to regimen.  Hematopoietic and Hemostatic   Thrombocytopenia (HCC) (Chronic)   Chronic, stable.  Continue collaboration with hematology, recent labs and note reviewed.        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  Recent note reviewed.      Hyperlipidemia   Chronic, ongoing.  Continue current medication regimen and adjust as needed.  Lipid panel today.         Relevant Orders   Comprehensive metabolic panel with GFR   Lipid Panel w/o Chol/HDL Ratio   Elevated hemoglobin A1c measurement   A1c 6% today, remaining in prediabetic range.  Continue focus on healthy diet and regular exercise.      Relevant Orders   Bayer DCA Hb A1c Waived (Completed)   Other Visit Diagnoses       Flu vaccine need       Flu vaccine in office today, educated patient.        Follow up plan: Return in about 6 months (around 02/09/2025) for Annual Physical after 02/02/25.

## 2024-08-13 LAB — COMPREHENSIVE METABOLIC PANEL WITH GFR
ALT: 19 IU/L (ref 0–44)
AST: 27 IU/L (ref 0–40)
Albumin: 4.1 g/dL (ref 3.8–4.8)
Alkaline Phosphatase: 78 IU/L (ref 44–121)
BUN/Creatinine Ratio: 14 (ref 10–24)
BUN: 12 mg/dL (ref 8–27)
Bilirubin Total: 1 mg/dL (ref 0.0–1.2)
CO2: 24 mmol/L (ref 20–29)
Calcium: 10.2 mg/dL (ref 8.6–10.2)
Chloride: 102 mmol/L (ref 96–106)
Creatinine, Ser: 0.86 mg/dL (ref 0.76–1.27)
Globulin, Total: 2.3 g/dL (ref 1.5–4.5)
Glucose: 81 mg/dL (ref 70–99)
Potassium: 4.3 mmol/L (ref 3.5–5.2)
Sodium: 138 mmol/L (ref 134–144)
Total Protein: 6.4 g/dL (ref 6.0–8.5)
eGFR: 90 mL/min/1.73 (ref >59)

## 2024-08-13 LAB — LIPID PANEL W/O CHOL/HDL RATIO
Cholesterol, Total: 132 mg/dL (ref 100–199)
HDL: 60 mg/dL (ref >39)
LDL Chol Calc (NIH): 57 mg/dL (ref 0–99)
Triglycerides: 73 mg/dL (ref 0–149)
VLDL Cholesterol Cal: 15 mg/dL (ref 5–40)

## 2024-08-13 NOTE — Progress Notes (Signed)
 Contacted via MyChart  Good afternoon Phil, your labs have returned and continue to look fabulous.  No changes needed.  Any questions? Keep being amazing!!  Thank you for allowing me to participate in your care.  I appreciate you. Kindest regards, Dreanna Kyllo

## 2024-08-23 ENCOUNTER — Other Ambulatory Visit: Payer: Self-pay | Admitting: Cardiology

## 2024-08-23 ENCOUNTER — Other Ambulatory Visit: Payer: Self-pay | Admitting: Nurse Practitioner

## 2024-08-23 DIAGNOSIS — I2510 Atherosclerotic heart disease of native coronary artery without angina pectoris: Secondary | ICD-10-CM

## 2024-08-23 DIAGNOSIS — E7800 Pure hypercholesterolemia, unspecified: Secondary | ICD-10-CM

## 2024-08-24 NOTE — Telephone Encounter (Signed)
 Requested Prescriptions  Pending Prescriptions Disp Refills   atorvastatin  (LIPITOR ) 80 MG tablet [Pharmacy Med Name: ATORVASTATIN  80MG  TABLETS] 90 tablet 1    Sig: TAKE 1 TABLET(80 MG) BY MOUTH AT BEDTIME     Cardiovascular:  Antilipid - Statins Failed - 08/24/2024 10:06 AM      Failed - Lipid Panel in normal range within the last 12 months    Cholesterol, Total  Date Value Ref Range Status  08/12/2024 132 100 - 199 mg/dL Final   Cholesterol Piccolo, Waived  Date Value Ref Range Status  05/31/2019 136 <200 mg/dL Final    Comment:                            Desirable                <200                         Borderline High      200- 239                         High                     >239    LDL Chol Calc (NIH)  Date Value Ref Range Status  08/12/2024 57 0 - 99 mg/dL Final   HDL  Date Value Ref Range Status  08/12/2024 60 >39 mg/dL Final   Triglycerides  Date Value Ref Range Status  08/12/2024 73 0 - 149 mg/dL Final   Triglycerides Piccolo,Waived  Date Value Ref Range Status  05/31/2019 87 <150 mg/dL Final    Comment:                            Normal                   <150                         Borderline High     150 - 199                         High                200 - 499                         Very High                >499          Passed - Patient is not pregnant      Passed - Valid encounter within last 12 months    Recent Outpatient Visits           1 week ago Ventricular tachycardia (HCC)   [place] [facility] [place], Melanie T, NP   6 months ago Ventricular tachycardia [facility])   [place] [facility] Valerio Melanie DASEN, NP       Future Appointments             In 2 months Fernand Denyse LABOR, MD [facility]             pantoprazole  (PROTONIX ) 40 MG tablet [Pharmacy Med Name: PANTOPRAZOLE  40MG  TABLETS]  90 tablet 1    Sig: TAKE 1 TABLET(40 MG) BY MOUTH DAILY     Gastroenterology: Proton  Pump Inhibitors Passed - 08/24/2024 10:06 AM      Passed - Valid encounter within last 12 months    Recent Outpatient Visits           1 week ago Ventricular tachycardia (HCC)   [place] [facility] [place], Melanie T, NP   6 months ago Ventricular tachycardia [facility])   [place] [facility] Valerio Melanie T, NP       Future Appointments             In 2 months Fernand Denyse LABOR, MD [facility]

## 2024-09-07 ENCOUNTER — Other Ambulatory Visit: Payer: Self-pay | Admitting: Cardiovascular Disease

## 2024-09-13 DIAGNOSIS — D0439 Carcinoma in situ of skin of other parts of face: Secondary | ICD-10-CM | POA: Diagnosis not present

## 2024-09-13 DIAGNOSIS — D1801 Hemangioma of skin and subcutaneous tissue: Secondary | ICD-10-CM | POA: Diagnosis not present

## 2024-09-13 DIAGNOSIS — Z08 Encounter for follow-up examination after completed treatment for malignant neoplasm: Secondary | ICD-10-CM | POA: Diagnosis not present

## 2024-09-13 DIAGNOSIS — L820 Inflamed seborrheic keratosis: Secondary | ICD-10-CM | POA: Diagnosis not present

## 2024-09-13 DIAGNOSIS — L570 Actinic keratosis: Secondary | ICD-10-CM | POA: Diagnosis not present

## 2024-09-13 DIAGNOSIS — D485 Neoplasm of uncertain behavior of skin: Secondary | ICD-10-CM | POA: Diagnosis not present

## 2024-09-13 DIAGNOSIS — Z85828 Personal history of other malignant neoplasm of skin: Secondary | ICD-10-CM | POA: Diagnosis not present

## 2024-09-13 DIAGNOSIS — L821 Other seborrheic keratosis: Secondary | ICD-10-CM | POA: Diagnosis not present

## 2024-09-13 DIAGNOSIS — L814 Other melanin hyperpigmentation: Secondary | ICD-10-CM | POA: Diagnosis not present

## 2024-09-13 DIAGNOSIS — L818 Other specified disorders of pigmentation: Secondary | ICD-10-CM | POA: Diagnosis not present

## 2024-09-13 DIAGNOSIS — D0421 Carcinoma in situ of skin of right ear and external auricular canal: Secondary | ICD-10-CM | POA: Diagnosis not present

## 2024-09-16 ENCOUNTER — Inpatient Hospital Stay: Attending: Oncology

## 2024-09-16 ENCOUNTER — Encounter: Payer: Self-pay | Admitting: Oncology

## 2024-09-16 ENCOUNTER — Inpatient Hospital Stay: Admitting: Oncology

## 2024-09-16 VITALS — BP 134/76 | HR 61 | Temp 96.8°F | Resp 19 | Ht 71.0 in | Wt 194.5 lb

## 2024-09-16 DIAGNOSIS — Z808 Family history of malignant neoplasm of other organs or systems: Secondary | ICD-10-CM | POA: Diagnosis not present

## 2024-09-16 DIAGNOSIS — Z87891 Personal history of nicotine dependence: Secondary | ICD-10-CM | POA: Insufficient documentation

## 2024-09-16 DIAGNOSIS — D72820 Lymphocytosis (symptomatic): Secondary | ICD-10-CM | POA: Insufficient documentation

## 2024-09-16 DIAGNOSIS — D708 Other neutropenia: Secondary | ICD-10-CM

## 2024-09-16 DIAGNOSIS — D696 Thrombocytopenia, unspecified: Secondary | ICD-10-CM | POA: Insufficient documentation

## 2024-09-16 LAB — CMP (CANCER CENTER ONLY)
ALT: 18 U/L (ref 0–44)
AST: 28 U/L (ref 15–41)
Albumin: 3.6 g/dL (ref 3.5–5.0)
Alkaline Phosphatase: 64 U/L (ref 38–126)
Anion gap: 6 (ref 5–15)
BUN: 11 mg/dL (ref 8–23)
CO2: 26 mmol/L (ref 22–32)
Calcium: 9.8 mg/dL (ref 8.9–10.3)
Chloride: 101 mmol/L (ref 98–111)
Creatinine: 1.03 mg/dL (ref 0.61–1.24)
GFR, Estimated: >60 mL/min (ref >60)
Glucose, Bld: 99 mg/dL (ref 70–99)
Potassium: 4.5 mmol/L (ref 3.5–5.1)
Sodium: 133 mmol/L — ABNORMAL LOW (ref 135–145)
Total Bilirubin: 1.2 mg/dL (ref 0.0–1.2)
Total Protein: 6.4 g/dL — ABNORMAL LOW (ref 6.5–8.1)

## 2024-09-16 LAB — CBC WITH DIFFERENTIAL (CANCER CENTER ONLY)
Abs Immature Granulocytes: 0.01 K/uL (ref 0.00–0.07)
Basophils Absolute: 0 K/uL (ref 0.0–0.1)
Basophils Relative: 0 %
Eosinophils Absolute: 0.2 K/uL (ref 0.0–0.5)
Eosinophils Relative: 7 %
HCT: 37.4 % — ABNORMAL LOW (ref 39.0–52.0)
Hemoglobin: 12.5 g/dL — ABNORMAL LOW (ref 13.0–17.0)
Immature Granulocytes: 0 %
Lymphocytes Relative: 31 %
Lymphs Abs: 0.9 K/uL (ref 0.7–4.0)
MCH: 31.5 pg (ref 26.0–34.0)
MCHC: 33.4 g/dL (ref 30.0–36.0)
MCV: 94.2 fL (ref 80.0–100.0)
Monocytes Absolute: 0.5 K/uL (ref 0.1–1.0)
Monocytes Relative: 17 %
Neutro Abs: 1.2 K/uL — ABNORMAL LOW (ref 1.7–7.7)
Neutrophils Relative %: 45 %
Platelet Count: 142 K/uL — ABNORMAL LOW (ref 150–400)
RBC: 3.97 MIL/uL — ABNORMAL LOW (ref 4.22–5.81)
RDW: 13.8 % (ref 11.5–15.5)
WBC Count: 2.8 K/uL — ABNORMAL LOW (ref 4.0–10.5)
nRBC: 0 % (ref 0.0–0.2)

## 2024-09-16 LAB — LACTATE DEHYDROGENASE: LDH: 163 U/L (ref 98–192)

## 2024-09-16 NOTE — Progress Notes (Signed)
 Hematology/Oncology Progress note [phone] [phone]      CHIEF COMPLAINTS/REASON FOR VISIT:  Follow-up for management of leukopenia and thrombocytopenia,   ASSESSMENT & PLAN:   Monoclonal B-cell lymphocytosis of undetermined significance Previous peripheral blood flow cytometry showed 2 minute CD5/CD23 positive monoclonal B-cell population, CLL/SLL phenotype.   No constitutional symptoms.  Repeat peripheral blood flow cytometry  Recommend observation.  Leukopenia Chronic leukopenia, predominantly neutropenia Previous work-up -including bone marrow biopsy were nonremarkable. 10/26/2019 bone marrow biopsy showed no significant dyspoiesis or increase in blast cells.  No significant lymphoid aggregates.  Normal cytogenetics. Labs reviewed and discussed with patient ANC is stable at 1.2  Continue observation.  Thrombocytopenia  very mild decrease of platelet count.  No intervention needed.  Continue observation.    Orders Placed This Encounter  Procedures   CBC with Differential (Cancer Center Only)    Standing Status:   Future    Expected Date:   03/17/2025    Expiration Date:   06/15/2025   CMP (Cancer Center only)    Standing Status:   Future    Expected Date:   03/17/2025    Expiration Date:   06/15/2025   Lactate dehydrogenase    Standing Status:   Future    Expected Date:   03/17/2025    Expiration Date:   06/15/2025   Flow cytometry panel-leukemia/lymphoma work-up    Standing Status:   Future    Expected Date:   03/17/2025    Expiration Date:   06/15/2025  Follow-up 6 months   all questions were answered. The patient knows to call the clinic with any problems, questions or concerns.  Zelphia Cap, MD, PhD [facility] Hematology Oncology 09/16/2024     HISTORY OF PRESENTING ILLNESS:  Shawn GAGE Sr. is a  78 y.o.  male with PMH listed below who was referred to me for evaluation of leukopenia. Reviewed patient's recent labs. Patient has low  total WBC count was 2.6, predominantly absolute lymphocyte 0.6. Earlier lab lab done 11/2019 showed WBC 2.8, lymphocyte 0.7, neutrophil 1.4.  Previous lab records reviewed. 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 No aggravating or improving factors.  Associated symptoms:  denies fatigue, weight loss, fever, chills, frequent infection.  History hepatitis or HIV infection: Denies History of chronic liver disease: Denies History of blood transfusion: Denies Alcohol consumption: Denies Diet Vegetarian or Vegan: Denies Herbal medication: Denies  Reports feeling quite tired and fatigued recently.  He and his wife recently downsized to a one-story home and he has been moving a lot of furniture's by himself.  He feels it took a week for his knee to recover after the most. Denies any unintentional weight loss, fever or chills, night sweating. He is very active, walks every day. Previous occupation was a Radiation protection practitioner.  Drinks a cocktail every day.  Usually drinks cocktail with quinine tonic water  for lower extremity cramps.  He started to do this after his ventricular tachycardic episodes about 1-1/2 years ago. Former smoker, quitting in 1972.   chronic back pain and had a lumbar spine x-ray done on 12/20/2020 which showed multilevel moderate lumbar degenerative disc disease, spine spondylolisthesis, facet arthropathy  INTERVAL HISTORY Shawn DELENA Yazzie Sr. is a 78 y.o. male who has above history reviewed by me today presents for follow up visit for management of leukopenia, monoclonal lymphocytosis of unknown significance No new complaints. Chronic fatigue, unchanged.  Denies weight loss, fever, chills, fatigue, night sweats.  Review of Systems  Constitutional:  Positive for malaise/fatigue. Negative for chills, fever and weight loss.  HENT:  Negative for sore throat.   Eyes:  Negative for redness.  Respiratory:  Negative for cough, shortness of  breath and wheezing.   Cardiovascular:  Negative for chest pain, palpitations and leg swelling.  Gastrointestinal:  Negative for abdominal pain, blood in stool, nausea and vomiting.  Genitourinary:  Negative for dysuria.  Musculoskeletal:  Negative for myalgias.  Skin:  Negative for rash.  Neurological:  Negative for dizziness, tingling and tremors.  Endo/Heme/Allergies:  Does not bruise/bleed easily.  Psychiatric/Behavioral:  Negative for hallucinations.     MEDICAL HISTORY:  Past Medical History:  Diagnosis Date   Allergy  1967   Arthritis    Asthma 2022   Eosinophilic Esophagitis   Basal cell carcinoma 1999   basal cell/skin   CAD (coronary artery disease)    Cataract Cataract surgery 2017   GERD (gastroesophageal reflux disease)    History of bone marrow biopsy    History of kidney stones 2013   passed stone on his own   Hyperlipidemia    Hypertension    Leukopenia    Shingles    Ventricular tachycardia (HCC)     SURGICAL HISTORY: Past Surgical History:  Procedure Laterality Date   CARDIAC CATHETERIZATION N/A 01/16/2016   Procedure: Left Heart Cath and Coronary Angiography;  Surgeon: Denyse DELENA Bathe, MD;  Location: [facility] INVASIVE CV LAB;  Service: Cardiovascular;  Laterality: N/A;   CATARACT EXTRACTION Left 2017   COLONOSCOPY WITH PROPOFOL  N/A 03/27/2020   Procedure: COLONOSCOPY WITH PROPOFOL ;  Surgeon: Jinny Carmine, MD;  Location: [facility];  Service: Endoscopy;  Laterality: N/A;  priority 4   CORONARY ANGIOPLASTY  2000 and 2003   ESOPHAGOGASTRODUODENOSCOPY (EGD) WITH PROPOFOL  N/A 04/01/2022   Procedure: ESOPHAGOGASTRODUODENOSCOPY (EGD) WITH PROPOFOL ;  Surgeon: Therisa Bi, MD;  Location: [facility] ENDOSCOPY;  Service: Gastroenterology;  Laterality: N/A;   EYE SURGERY  2017   Cateract   EYE SURGERY Left 04/09/2022   GANGLION CYST EXCISION  1979   HAND SURGERY Right 02/08/2021   blood clot removal per patient   KNEE ARTHROSCOPY WITH MEDIAL MENISECTOMY Left  12/01/2017   Procedure: KNEE ARTHROSCOPY WITH MEDIAL MENISECTOMY;  Surgeon: Leora Lynwood SAUNDERS, MD;  Location: [facility] ORS;  Service: Orthopedics;  Laterality: Left;  Partial   LEFT HEART CATH Right 08/07/2017   Procedure: Left Heart Cath;  Surgeon: Bathe Denyse DELENA, MD;  Location: [facility] INVASIVE CV LAB;  Service: Cardiovascular;  Laterality: Right;   PILONIDAL CYST EXCISION  1977   POLYPECTOMY  03/27/2020   Procedure: POLYPECTOMY;  Surgeon: Jinny Carmine, MD;  Location: [facility]ERY [facility];  Service: Endoscopy;;   SKIN CANCER EXCISION  1999   ear   SYNOVECTOMY Left 12/01/2017   Procedure: SYNOVECTOMY;  Surgeon: Leora Lynwood SAUNDERS, MD;  Location: [facility] ORS;  Service: Orthopedics;  Laterality: Left;  Partial   V TACH ABLATION N/A 08/14/2017   Procedure: V Tach Ablation;  Surgeon: Inocencio Soyla Lunger, MD;  Location: [facility] INVASIVE CV LAB;  Service: Cardiovascular;  Laterality: N/A;   VEIN SURGERY  2000   laser    SOCIAL HISTORY: Social History   Socioeconomic History   Marital status: Married    Spouse name: Gustav   Number of children: 2   Years of education: Not on file   Highest education level: Master's degree (e.g., MA, MS, MEng, MEd, MSW, MBA)  Occupational History   Occupation: retired  Tobacco Use  Smoking status: Former    Current packs/day: 0.00    Average packs/day: 1 pack/day for 5.0 years (5.0 ttl pk-yrs)    Types: Cigarettes    Start date: 06/20/1966    Quit date: 06/21/1971    Years since quitting: 53.2   Smokeless tobacco: Former    Types: Chew    Quit date: 06/20/1980  Vaping Use   Vaping status: Never Used  Substance and Sexual Activity   Alcohol use: Not Currently    Comment: rare glass of wine, monthly or less   Drug use: No   Sexual activity: Not Currently  Other Topics Concern   Not on file  Social History Narrative   Lives in [place]   Retired school principal   Social Drivers of Health   Financial Resource Strain: Low Risk  (08/08/2024)   Overall Financial  Resource Strain (CARDIA)    Difficulty of Paying Living Expenses: Not hard at all  Food Insecurity: No Food Insecurity (08/08/2024)   Hunger Vital Sign    Worried About Running Out of Food in the Last Year: Never true    Ran Out of Food in the Last Year: Never true  Transportation Needs: No Transportation Needs (08/08/2024)   PRAPARE - Administrator, Civil Service (Medical): No    Lack of Transportation (Non-Medical): No  Physical Activity: Insufficiently Active (08/08/2024)   Exercise Vital Sign    Days of Exercise per Week: 3 days    Minutes of Exercise per Session: 40 min  Stress: No Stress Concern Present (08/08/2024)   [employer] of Occupational Health - Occupational Stress Questionnaire    Feeling of Stress: Not at all  Social Connections: Socially Integrated (08/08/2024)   Social Connection and Isolation Panel    Frequency of Communication with Friends and Family: More than three times a week    Frequency of Social Gatherings with Friends and Family: Three times a week    Attends Religious Services: More than 4 times per year    Active Member of Clubs or Organizations: Yes    Attends Banker Meetings: More than 4 times per year    Marital Status: Married  Catering manager Violence: Not At Risk (11/04/2023)   Humiliation, Afraid, Rape, and Kick questionnaire    Fear of Current or Ex-Partner: No    Emotionally Abused: No    Physically Abused: No    Sexually Abused: No    FAMILY HISTORY: Family History  Problem Relation Age of Onset   Hypertension Mother    Heart disease Mother    Heart attack Mother    Diabetes Mother    Alzheimer's disease Father    Alzheimer's disease Brother    Congestive Heart Failure Brother    Hypertension Brother    Other Brother        dementia   Heart disease Brother    Brain cancer Daughter 30   Cancer Daughter    Heart disease Son    Cancer Maternal Uncle    Cancer Maternal Grandfather     ALLERGIES:  is  allergic to egg protein-containing drug products and peanut (diagnostic).  MEDICATIONS:  Current Outpatient Medications  Medication Sig Dispense Refill   acetaminophen  (TYLENOL ) 500 MG tablet Take 1,000 mg by mouth every 6 (six) hours as needed for moderate pain or headache.     ALLERGY  RELIEF 180 MG tablet TAKE 1 TABLET BY MOUTH EVERY DAY 90 tablet 1   aspirin  EC 81 MG tablet Take  81 mg by mouth daily.     atorvastatin  (LIPITOR ) 80 MG tablet TAKE 1 TABLET(80 MG) BY MOUTH AT BEDTIME 90 tablet 1   Cholecalciferol (VITAMIN D3) 50 MCG (2000 UT) CHEW Chew 1 tablet by mouth daily.     furosemide  (LASIX ) 40 MG tablet Take 1 tablet (40 mg total) by mouth daily. 30 tablet 11   metoprolol  succinate (TOPROL  XL) 50 MG 24 hr tablet Take 1 tablet (50 mg total) by mouth daily. Take with or immediately following a meal. 30 tablet 11   montelukast  (SINGULAIR ) 10 MG tablet TAKE 1 TABLET(10 MG) BY MOUTH AT BEDTIME 90 tablet 1   niacin  (NIASPAN ) 1000 MG CR tablet TAKE 1 TABLET(1000 MG) BY MOUTH AT BEDTIME 90 tablet 3   pantoprazole  (PROTONIX ) 40 MG tablet TAKE 1 TABLET(40 MG) BY MOUTH DAILY 90 tablet 1   potassium chloride  (KLOR-CON  M) 10 MEQ tablet TAKE 1 TABLET(10 MEQ) BY MOUTH DAILY 30 tablet 2   ramipril  (ALTACE ) 10 MG capsule TAKE 1 CAPSULE BY MOUTH DAILY 90 capsule 0   vitamin B-12 (CYANOCOBALAMIN ) 1000 MCG tablet Take 1 tablet (1,000 mcg total) by mouth daily. 90 tablet 0   flecainide  (TAMBOCOR ) 100 MG tablet Take 2 tablets (200 mg total) by mouth as needed. As needed for recurrent tachycardia.  Do not repeat more than once per day or 3x per wk (Patient not taking: Reported on 09/16/2024) 2 tablet 3   No current facility-administered medications for this visit.     PHYSICAL EXAMINATION: ECOG PERFORMANCE STATUS: 0 - Asymptomatic Vitals:   09/16/24 1102  BP: 134/76  Pulse: 61  Resp: 19  Temp: (!) 96.8 F (36 C)  SpO2: 97%   Filed Weights   09/16/24 1102  Weight: 194 lb 8 oz (88.2 kg)     Physical Exam Constitutional:      General: He is not in acute distress. HENT:     Head: Normocephalic and atraumatic.  Eyes:     General: No scleral icterus.    Pupils: Pupils are equal, round, and reactive to light.  Cardiovascular:     Rate and Rhythm: Normal rate and regular rhythm.     Heart sounds: Normal heart sounds.  Pulmonary:     Effort: Pulmonary effort is normal. No respiratory distress.     Breath sounds: No wheezing.  Abdominal:     General: Bowel sounds are normal. There is no distension.     Palpations: Abdomen is soft. There is no mass.     Tenderness: There is no abdominal tenderness.  Musculoskeletal:        General: No deformity. Normal range of motion.     Cervical back: Normal range of motion and neck supple.  Skin:    General: Skin is warm and dry.     Findings: No erythema or rash.  Neurological:     Mental Status: He is alert and oriented to person, place, and time.     Cranial Nerves: No cranial nerve deficit.     Coordination: Coordination normal.  Psychiatric:        Behavior: Behavior normal.        Thought Content: Thought content normal.      LABORATORY DATA:  I have reviewed the data as listed    Latest Ref Rng & Units 09/16/2024   10:38 AM 03/18/2024   10:19 AM 02/03/2024    2:11 PM  CBC  WBC 4.0 - 10.5 K/uL 2.8  3.6  3.8  Hemoglobin 13.0 - 17.0 g/dL 87.4  86.6  86.7   Hematocrit 39.0 - 52.0 % 37.4  39.3  39.5   Platelets 150 - 400 K/uL 142  148  165       Latest Ref Rng & Units 09/16/2024   10:38 AM 08/12/2024   10:48 AM 03/18/2024   10:20 AM  CMP  Glucose 70 - 99 mg/dL 99  81  898   BUN 8 - 23 mg/dL 11  12  13    Creatinine 0.61 - 1.24 mg/dL 8.96  9.13  9.09   Sodium 135 - 145 mmol/L 133  138  136   Potassium 3.5 - 5.1 mmol/L 4.5  4.3  4.4   Chloride 98 - 111 mmol/L 101  102  105   CO2 22 - 32 mmol/L 26  24  27    Calcium  8.9 - 10.3 mg/dL 9.8  89.7  9.9   Total Protein 6.5 - 8.1 g/dL 6.4  6.4  6.4   Total Bilirubin 0.0  - 1.2 mg/dL 1.2  1.0  1.2   Alkaline Phos 38 - 126 U/L 64  78  67   AST 15 - 41 U/L 28  27  28    ALT 0 - 44 U/L 18  19  19       Iron/TIBC/Ferritin/ %Sat No results found for: IRON, TIBC, FERRITIN, IRONPCTSAT

## 2024-09-16 NOTE — Progress Notes (Signed)
 Patient has no new or acute concerns at this time.

## 2024-09-16 NOTE — Assessment & Plan Note (Signed)
 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   No constitutional symptoms.  Repeat peripheral blood flow cytometry  Recommend observation.

## 2024-09-16 NOTE — Assessment & Plan Note (Signed)
very mild decrease of platelet count.  No intervention needed.  Continue observation.

## 2024-09-16 NOTE — Assessment & Plan Note (Signed)
 Chronic leukopenia, predominantly neutropenia Previous work-up -including bone marrow biopsy were nonremarkable. 10/26/2019 bone marrow biopsy showed no significant dyspoiesis or increase in blast cells.  No significant lymphoid aggregates.  Normal cytogenetics. Labs reviewed and discussed with patient ANC is stable at 1.2  Continue observation.

## 2024-09-23 ENCOUNTER — Ambulatory Visit
Admission: RE | Admit: 2024-09-23 | Discharge: 2024-09-23 | Disposition: A | Discharge status: Home / Self Care | Source: Ambulatory Visit | Attending: Nurse Practitioner | Admitting: Nurse Practitioner

## 2024-09-23 DIAGNOSIS — M810 Age-related osteoporosis without current pathological fracture: Secondary | ICD-10-CM | POA: Diagnosis not present

## 2024-09-24 NOTE — Progress Notes (Signed)
 Contacted via MyChart  Good morning Shawn Shepard, your DEXA scan has returned and overall scores have improved a little and show osteopenia vs osteoporosis seen on last scan. This is good news. I would continue supplements and getting daily exercise.  Any questions? Keep being stellar!!  Thank you for allowing me to participate in your care.  I appreciate you. Kindest regards, Dim Meisinger

## 2024-11-09 ENCOUNTER — Telehealth: Payer: Self-pay

## 2024-11-09 NOTE — Telephone Encounter (Signed)
 Unsuccessful attempts to reach patient on preferred number listed in notes for scheduled AWV. Left message on voicemail okay to reschedule.

## 2024-11-11 ENCOUNTER — Encounter: Payer: Self-pay | Admitting: Cardiovascular Disease

## 2024-11-11 ENCOUNTER — Ambulatory Visit: Admitting: Cardiovascular Disease

## 2024-11-11 VITALS — BP 126/62 | HR 69 | Ht 71.0 in | Wt 198.6 lb

## 2024-11-11 DIAGNOSIS — R600 Localized edema: Secondary | ICD-10-CM | POA: Diagnosis not present

## 2024-11-11 DIAGNOSIS — R001 Bradycardia, unspecified: Secondary | ICD-10-CM | POA: Diagnosis not present

## 2024-11-11 DIAGNOSIS — E7800 Pure hypercholesterolemia, unspecified: Secondary | ICD-10-CM | POA: Diagnosis not present

## 2024-11-11 DIAGNOSIS — I2583 Coronary atherosclerosis due to lipid rich plaque: Secondary | ICD-10-CM | POA: Diagnosis not present

## 2024-11-11 DIAGNOSIS — I472 Ventricular tachycardia, unspecified: Secondary | ICD-10-CM | POA: Diagnosis not present

## 2024-11-11 DIAGNOSIS — Z131 Encounter for screening for diabetes mellitus: Secondary | ICD-10-CM

## 2024-11-11 DIAGNOSIS — I2510 Atherosclerotic heart disease of native coronary artery without angina pectoris: Secondary | ICD-10-CM | POA: Diagnosis not present

## 2024-11-11 DIAGNOSIS — R609 Edema, unspecified: Secondary | ICD-10-CM

## 2024-11-11 DIAGNOSIS — I10 Essential (primary) hypertension: Secondary | ICD-10-CM

## 2024-11-11 DIAGNOSIS — R0602 Shortness of breath: Secondary | ICD-10-CM | POA: Diagnosis not present

## 2024-11-11 MED ORDER — FLECAINIDE ACETATE 100 MG PO TABS: 200.0000 mg | ORAL_TABLET | ORAL | 3 refills | Status: AC | PRN

## 2024-11-11 NOTE — Progress Notes (Signed)
 Cardiology Office Note   Date:  11/11/2024   ID:  Shawn DELENA Floor Sr., DOB [birth year], MRN [id number]  PCP:  Valerio Melanie DASEN, NP  Cardiologist:  Denyse Bathe, MD      History of Present Illness: Shawn DELENA Floor Sr. is a 78 y.o. male who presents for  Chief Complaint  Patient presents with   Follow-up    3 month follow up    Has fatigue, with metoprolol .      Past Medical History:  Diagnosis Date   Allergy  1967   Arthritis    Asthma 2022   Eosinophilic Esophagitis   Basal cell carcinoma 1999   basal cell/skin   CAD (coronary artery disease)    Cataract Cataract surgery 2017   GERD (gastroesophageal reflux disease)    History of bone marrow biopsy    History of kidney stones 2013   passed stone on his own   Hyperlipidemia    Hypertension    Leukopenia    Shingles    Ventricular tachycardia [facility])      Past Surgical History:  Procedure Laterality Date   CARDIAC CATHETERIZATION N/A 01/16/2016   Procedure: Left Heart Cath and Coronary Angiography;  Surgeon: Denyse DELENA Bathe, MD;  Location: [facility] INVASIVE CV LAB;  Service: Cardiovascular;  Laterality: N/A;   CATARACT EXTRACTION Left 2017   COLONOSCOPY WITH PROPOFOL  N/A 03/27/2020   Procedure: COLONOSCOPY WITH PROPOFOL ;  Surgeon: Jinny Carmine, MD;  Location: [facility] SURGERY CNTR;  Service: Endoscopy;  Laterality: N/A;  priority 4   CORONARY ANGIOPLASTY  2000 and 2003   ESOPHAGOGASTRODUODENOSCOPY (EGD) WITH PROPOFOL  N/A 04/01/2022   Procedure: ESOPHAGOGASTRODUODENOSCOPY (EGD) WITH PROPOFOL ;  Surgeon: Therisa Bi, MD;  Location: [facility] ENDOSCOPY;  Service: Gastroenterology;  Laterality: N/A;   EYE SURGERY  2017   Cateract   EYE SURGERY Left 04/09/2022   GANGLION CYST EXCISION  1979   HAND SURGERY Right 02/08/2021   blood clot removal per patient   KNEE ARTHROSCOPY WITH MEDIAL MENISECTOMY Left 12/01/2017   Procedure: KNEE ARTHROSCOPY WITH MEDIAL MENISECTOMY;  Surgeon: Leora Lynwood SAUNDERS,  MD;  Location: [facility] ORS;  Service: Orthopedics;  Laterality: Left;  Partial   LEFT HEART CATH Right 08/07/2017   Procedure: Left Heart Cath;  Surgeon: Bathe Denyse DELENA, MD;  Location: [facility] INVASIVE CV LAB;  Service: Cardiovascular;  Laterality: Right;   PILONIDAL CYST EXCISION  1977   POLYPECTOMY  03/27/2020   Procedure: POLYPECTOMY;  Surgeon: Jinny Carmine, MD;  Location: [facility] SURGERY CNTR;  Service: Endoscopy;;   SKIN CANCER EXCISION  1999   ear   SYNOVECTOMY Left 12/01/2017   Procedure: SYNOVECTOMY;  Surgeon: Leora Lynwood SAUNDERS, MD;  Location: [facility] ORS;  Service: Orthopedics;  Laterality: Left;  Partial   V TACH ABLATION N/A 08/14/2017   Procedure: V Tach Ablation;  Surgeon: Inocencio Soyla Lunger, MD;  Location: [facility] INVASIVE CV LAB;  Service: Cardiovascular;  Laterality: N/A;   VEIN SURGERY  2000   laser     Current Outpatient Medications  Medication Sig Dispense Refill   acetaminophen  (TYLENOL ) 500 MG tablet Take 1,000 mg by mouth every 6 (six) hours as needed for moderate pain or headache.     ALLERGY  RELIEF 180 MG tablet TAKE 1 TABLET BY MOUTH EVERY DAY 90 tablet 1   aspirin  EC 81 MG tablet Take 81 mg by mouth daily.     atorvastatin  (LIPITOR ) 80 MG tablet TAKE 1 TABLET(80 MG) BY MOUTH AT BEDTIME 90 tablet 1   Cholecalciferol (VITAMIN  D3) 50 MCG (2000 UT) CHEW Chew 1 tablet by mouth daily.     furosemide  (LASIX ) 40 MG tablet Take 1 tablet (40 mg total) by mouth daily. 30 tablet 11   metoprolol  succinate (TOPROL  XL) 50 MG 24 hr tablet Take 1 tablet (50 mg total) by mouth daily. Take with or immediately following a meal. 30 tablet 11   montelukast  (SINGULAIR ) 10 MG tablet TAKE 1 TABLET(10 MG) BY MOUTH AT BEDTIME 90 tablet 1   niacin  (NIASPAN ) 1000 MG CR tablet TAKE 1 TABLET(1000 MG) BY MOUTH AT BEDTIME 90 tablet 3   pantoprazole  (PROTONIX ) 40 MG tablet TAKE 1 TABLET(40 MG) BY MOUTH DAILY 90 tablet 1   potassium chloride  (KLOR-CON  M) 10 MEQ tablet TAKE 1 TABLET(10 MEQ) BY  MOUTH DAILY 30 tablet 2   ramipril  (ALTACE ) 10 MG capsule TAKE 1 CAPSULE BY MOUTH DAILY 90 capsule 0   vitamin B-12 (CYANOCOBALAMIN ) 1000 MCG tablet Take 1 tablet (1,000 mcg total) by mouth daily. 90 tablet 0   flecainide  (TAMBOCOR ) 100 MG tablet Take 2 tablets (200 mg total) by mouth as needed. As needed for recurrent tachycardia.  Do not repeat more than once per day or 3x per wk 2 tablet 3   No current facility-administered medications for this visit.    Allergies:   Egg protein-containing drug products and Peanut (diagnostic)    Social History:   reports that he quit smoking about 53 years ago. His smoking use included cigarettes. He started smoking about 58 years ago. He has a 5 pack-year smoking history. He quit smokeless tobacco use about 44 years ago.  His smokeless tobacco use included chew. He reports that he does not currently use alcohol. He reports that he does not use drugs.   Family History:  family history includes Alzheimer's disease in his brother and father; Brain cancer (age of onset: 41) in his daughter; Cancer in his daughter, maternal grandfather, and maternal uncle; Congestive Heart Failure in his brother; Diabetes in his mother; Heart attack in his mother; Heart disease in his brother, mother, and son; Hypertension in his brother and mother; Other in his brother.    ROS:     Review of Systems  Constitutional: Negative.   HENT: Negative.    Eyes: Negative.   Respiratory: Negative.    Gastrointestinal: Negative.   Genitourinary: Negative.   Musculoskeletal: Negative.   Skin: Negative.   Neurological: Negative.   Endo/Heme/Allergies: Negative.   Psychiatric/Behavioral: Negative.    All other systems reviewed and are negative.     All other systems are reviewed and negative.    PHYSICAL EXAM: VS:  BP 126/62   Pulse 69   Ht 5' 11 (1.803 m)   Wt 198 lb 9.6 oz (90.1 kg)   SpO2 97%   BMI 27.70 kg/m  , BMI Body mass index is 27.7 kg/m. Last weight:   Wt Readings from Last 3 Encounters:  11/11/24 198 lb 9.6 oz (90.1 kg)  09/16/24 194 lb 8 oz (88.2 kg)  08/12/24 193 lb 3.2 oz (87.6 kg)     Physical Exam Vitals reviewed.  Constitutional:      Appearance: Normal appearance. He is normal weight.  HENT:     Head: Normocephalic.     Nose: Nose normal.     Mouth/Throat:     Mouth: Mucous membranes are moist.  Eyes:     Pupils: Pupils are equal, round, and reactive to light.  Cardiovascular:     Rate and Rhythm:  Normal rate and regular rhythm.     Pulses: Normal pulses.     Heart sounds: Normal heart sounds.  Pulmonary:     Effort: Pulmonary effort is normal.  Abdominal:     General: Abdomen is flat. Bowel sounds are normal.  Musculoskeletal:        General: Normal range of motion.     Cervical back: Normal range of motion.  Skin:    General: Skin is warm.  Neurological:     General: No focal deficit present.     Mental Status: He is alert.  Psychiatric:        Mood and Affect: Mood normal.       EKG:   Recent Labs: 02/03/2024: Magnesium  2.0; TSH 0.733 09/16/2024: ALT 18; BUN 11; Creatinine 1.03; Hemoglobin 12.5; Platelet Count 142; Potassium 4.5; Sodium 133    Lipid Panel    Component Value Date/Time   CHOL 132 08/12/2024 1048   CHOL 136 05/31/2019 0852   TRIG 73 08/12/2024 1048   TRIG 87 05/31/2019 0852   HDL 60 08/12/2024 1048   CHOLHDL 2.2 11/09/2018 1005   VLDL 17 05/31/2019 0852   LDLCALC 57 08/12/2024 1048      Other studies Reviewed: Additional studies/ records that were reviewed today include:  Review of the above records demonstrates:       No data to display            ASSESSMENT AND PLAN:    ICD-10-CM   1. SOB (shortness of breath)  R06.02    breathing better with higher dose metoprolol  and change to evening time dossing to prevent fatigue.    2. Essential hypertension  I10 flecainide  (TAMBOCOR ) 100 MG tablet    3. Ventricular tachycardia (HCC)  I47.20 flecainide  (TAMBOCOR ) 100  MG tablet    4. Bradycardia  R00.1 flecainide  (TAMBOCOR ) 100 MG tablet    5. Pure hypercholesterolemia  E78.00 flecainide  (TAMBOCOR ) 100 MG tablet    6. Edema, unspecified type  R60.9     7. Bilateral leg edema  R60.0     8. Coronary artery disease involving native coronary artery of native heart without angina pectoris  I25.10     9. Ventricular tachycardia (HCC)  I47.20 flecainide  (TAMBOCOR ) 100 MG tablet   Will refill flecainide  200 mg which is needed as a as needed basis for palpitation as may be expired.    10. Essential hypertension  I10 flecainide  (TAMBOCOR ) 100 MG tablet   Blood pressure appears to be very stable.    11. Coronary artery disease due to lipid rich plaque  I25.10 flecainide  (TAMBOCOR ) 100 MG tablet   I25.83     12. Bradycardia  R00.1 flecainide  (TAMBOCOR ) 100 MG tablet   Lowering of the metoprolol  helped with getting rid of fatigue.  Patient is feeling much better.       Problem List Items Addressed This Visit       Cardiovascular and Mediastinum   CAD (coronary artery disease)   Relevant Medications   flecainide  (TAMBOCOR ) 100 MG tablet   Essential hypertension   Relevant Medications   flecainide  (TAMBOCOR ) 100 MG tablet   Ventricular tachycardia (HCC)   Relevant Medications   flecainide  (TAMBOCOR ) 100 MG tablet     Other   Bradycardia (Chronic)   Relevant Medications   flecainide  (TAMBOCOR ) 100 MG tablet   Hyperlipidemia   Relevant Medications   flecainide  (TAMBOCOR ) 100 MG tablet   Bilateral leg edema   Other Visit Diagnoses  SOB (shortness of breath)    -  Primary   breathing better with higher dose metoprolol  and change to evening time dossing to prevent fatigue.     Edema, unspecified type              Disposition:   Return in about 3 months (around 02/09/2025).    Total time spent: 30 minutes  Signed,  Denyse Bathe, MD  11/11/2024 2:03 PM    [facility]

## 2024-11-29 ENCOUNTER — Other Ambulatory Visit: Payer: Self-pay | Admitting: Cardiovascular Disease

## 2024-12-10 ENCOUNTER — Other Ambulatory Visit: Payer: Self-pay | Admitting: Nurse Practitioner

## 2024-12-12 ENCOUNTER — Ambulatory Visit
Admission: EM | Admit: 2024-12-12 | Discharge: 2024-12-12 | Disposition: A | Discharge status: Home / Self Care | Attending: Emergency Medicine | Admitting: Emergency Medicine

## 2024-12-12 ENCOUNTER — Encounter: Payer: Self-pay | Admitting: Emergency Medicine

## 2024-12-12 DIAGNOSIS — J209 Acute bronchitis, unspecified: Secondary | ICD-10-CM | POA: Diagnosis not present

## 2024-12-12 MED ORDER — PREDNISONE 10 MG (21) PO TBPK: ORAL_TABLET | Freq: Every day | ORAL | 0 refills | Status: AC

## 2024-12-12 MED ORDER — ALBUTEROL SULFATE HFA 108 (90 BASE) MCG/ACT IN AERS: 1.0000 | INHALATION_SPRAY | Freq: Four times a day (QID) | RESPIRATORY_TRACT | 0 refills | Status: AC | PRN

## 2024-12-12 NOTE — ED Triage Notes (Addendum)
 Patient in office today complaint of cough, congestion with greenish mucus x2d. Stated no fever   NUR:dlijqzi, Robitussin DM, tylenol   Denies: N/V,fever, diarrhea

## 2024-12-12 NOTE — Discharge Instructions (Addendum)
 Take the prednisone  and use the albuterol  inhaler as directed.  Follow up with your primary care provider.  Go to the emergency department if you have worsening symptoms.

## 2024-12-12 NOTE — ED Provider Notes (Signed)
 " Shawn Shepard    CSN: [id number] Arrival date & time: 12/12/24  1003      History   Chief Complaint Chief Complaint  Patient presents with   Cough   Nasal Congestion    HPI Shawn GOSSARD Sr. is a 79 y.o. male.  Patient presents with 2-day history of congestion and cough.  No fever, chest pain, shortness of breath.  Treatment attempted with Tylenol  and Sudafed.  He states his current symptoms are his usual for yearly winter bronchitis.  His medical history includes hypertension, CAD, hyperlipidemia, eosinophilic esophagitis.  The history is provided by the patient and medical records.    Past Medical History:  Diagnosis Date   Allergy  1967   Arthritis    Asthma 2022   Eosinophilic Esophagitis   Basal cell carcinoma 1999   basal cell/skin   CAD (coronary artery disease)    Cataract Cataract surgery 2017   GERD (gastroesophageal reflux disease)    History of bone marrow biopsy    History of kidney stones 2013   passed stone on his own   Hyperlipidemia    Hypertension    Leukopenia    Shingles    Ventricular tachycardia [facility])     Patient Active Problem List   Diagnosis Date Noted   Elevated hemoglobin A1c measurement 08/08/2024   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   Bradycardia 09/20/2022   Bilateral leg edema 05/02/2022   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   Chronic left hip pain 11/28/2020   Leukopenia 11/25/2020   Thrombocytopenia 11/25/2020   Allergic rhinitis 05/28/2020   BPH (benign prostatic hyperplasia) 11/09/2018   Ventricular tachycardia (HCC) 08/10/2017   Eosinophilic esophagitis 03/04/2017   CAD (coronary artery disease)    Hyperlipidemia    Essential hypertension     Past Surgical History:  Procedure Laterality Date   CARDIAC CATHETERIZATION N/A 01/16/2016   Procedure: Left Heart Cath and Coronary Angiography;  Surgeon: Denyse DELENA Bathe, MD;  Location: [facility] INVASIVE CV LAB;  Service: Cardiovascular;  Laterality: N/A;   CATARACT EXTRACTION Left 2017   COLONOSCOPY WITH PROPOFOL  N/A 03/27/2020   Procedure: COLONOSCOPY WITH PROPOFOL ;  Surgeon: Jinny Carmine, MD;  Location: [facility] SURGERY [facility];  Service: Endoscopy;  Laterality: N/A;  priority 4   CORONARY ANGIOPLASTY  2000 and 2003   ESOPHAGOGASTRODUODENOSCOPY (EGD) WITH PROPOFOL  N/A 04/01/2022   Procedure: ESOPHAGOGASTRODUODENOSCOPY (EGD) WITH PROPOFOL ;  Surgeon: Therisa Bi, MD;  Location: [facility] ENDOSCOPY;  Service: Gastroenterology;  Laterality: N/A;   EYE SURGERY  2017   Cateract   EYE SURGERY Left 04/09/2022   GANGLION CYST EXCISION  1979   HAND SURGERY Right 02/08/2021   blood clot removal per patient   KNEE ARTHROSCOPY WITH MEDIAL MENISECTOMY Left 12/01/2017   Procedure: KNEE ARTHROSCOPY WITH MEDIAL MENISECTOMY;  Surgeon: Leora Lynwood SAUNDERS, MD;  Location: [facility] ORS;  Service: Orthopedics;  Laterality: Left;  Partial   LEFT HEART CATH Right 08/07/2017   Procedure: Left Heart Cath;  Surgeon: Bathe Denyse DELENA, MD;  Location: [facility] INVASIVE CV LAB;  Service: Cardiovascular;  Laterality: Right;   PILONIDAL CYST EXCISION  1977   POLYPECTOMY  03/27/2020   Procedure: POLYPECTOMY;  Surgeon: Jinny Carmine, MD;  Location: [facility] SURGERY CNTR;  Service: Endoscopy;;   SKIN CANCER EXCISION  1999   ear   SYNOVECTOMY Left 12/01/2017   Procedure: SYNOVECTOMY;  Surgeon: Leora Lynwood SAUNDERS, MD;  Location: [facility] ORS;  Service: Orthopedics;  Laterality: Left;  Partial   V TACH ABLATION N/A 08/14/2017   Procedure: V Tach Ablation;  Surgeon: Inocencio Soyla Lunger, MD;  Location: [facility] INVASIVE CV LAB;  Service: Cardiovascular;  Laterality: N/A;   VEIN SURGERY  2000   laser       Home Medications    Prior to Admission medications  Medication Sig Start Date End Date Taking? Authorizing Provider  albuterol  (VENTOLIN  HFA) 108 (90 Base) MCG/ACT inhaler Inhale 1-2 puffs into the lungs every 6 (six)  hours as needed. 12/12/24  Yes Corlis Burnard DEL, NP  predniSONE  (STERAPRED UNI-PAK 21 TAB) 10 MG (21) TBPK tablet Take by mouth daily. As directed 12/12/24  Yes Corlis Burnard DEL, NP  acetaminophen  (TYLENOL ) 500 MG tablet Take 1,000 mg by mouth every 6 (six) hours as needed for moderate pain or headache.    [provider]  ALLERGY  RELIEF 180 MG tablet TAKE 1 TABLET BY MOUTH EVERY DAY 10/16/22   Cannady, [name] T, NP  aspirin  EC 81 MG tablet Take 81 mg by mouth daily.    [provider]  atorvastatin  (LIPITOR ) 80 MG tablet TAKE 1 TABLET(80 MG) BY MOUTH AT BEDTIME 08/24/24   Cannady, [name] T, NP  Cholecalciferol (VITAMIN D3) 50 MCG (2000 UT) CHEW Chew 1 tablet by mouth daily.    [provider]  flecainide  (TAMBOCOR ) 100 MG tablet Take 2 tablets (200 mg total) by mouth as needed. As needed for recurrent tachycardia.  Do not repeat more than once per day or 3x per wk 11/11/24   Fernand Denyse LABOR, MD  furosemide  (LASIX ) 40 MG tablet Take 1 tablet (40 mg total) by mouth daily. 06/14/24 06/14/25  Fernand Denyse LABOR, MD  metoprolol  succinate (TOPROL  XL) 50 MG 24 hr tablet Take 1 tablet (50 mg total) by mouth daily. Take with or immediately following a meal. 07/15/24 07/15/25  Fernand Denyse A, MD  montelukast  (SINGULAIR ) 10 MG tablet TAKE 1 TABLET(10 MG) BY MOUTH AT BEDTIME 06/17/24   Cannady, [name] T, NP  niacin  (NIASPAN ) 1000 MG CR tablet TAKE 1 TABLET(1000 MG) BY MOUTH AT BEDTIME 03/04/24   Cannady, [name] T, NP  pantoprazole  (PROTONIX ) 40 MG tablet TAKE 1 TABLET(40 MG) BY MOUTH DAILY 08/24/24   Cannady, [name] T, NP  potassium chloride  (KLOR-CON  M) 10 MEQ tablet TAKE 1 TABLET(10 MEQ) BY MOUTH DAILY 11/29/24   Fernand Denyse A, MD  ramipril  (ALTACE ) 10 MG capsule TAKE 1 CAPSULE BY MOUTH DAILY 08/23/24   Fernand Denyse LABOR, MD  vitamin B-12 (CYANOCOBALAMIN ) 1000 MCG tablet Take 1 tablet (1,000 mcg total) by mouth daily. 01/12/19   Babara Call, MD    Family History Family History  Problem Relation Age of  Onset   Hypertension Mother    Heart disease Mother    Heart attack Mother    Diabetes Mother    Alzheimer's disease Father    Alzheimer's disease Brother    Congestive Heart Failure Brother    Hypertension Brother    Other Brother        dementia   Heart disease Brother    Brain cancer Daughter 96   Cancer Daughter    Heart disease Son    Cancer Maternal Uncle    Cancer Maternal Grandfather     Social History Social History[1]   Allergies   Egg protein-containing drug products and Peanut (diagnostic)   Review of Systems Review of Systems  Constitutional:  Negative for chills and fever.  HENT:  Positive for congestion.  Negative for ear pain and sore throat.   Respiratory:  Positive for cough. Negative for shortness of breath.   Cardiovascular:  Negative for chest pain and palpitations.     Physical Exam Triage Vital Signs ED Triage Vitals  Encounter Vitals Group     BP 12/12/24 1112 117/70     Girls Systolic BP Percentile --      Girls Diastolic BP Percentile --      Boys Systolic BP Percentile --      Boys Diastolic BP Percentile --      Pulse Rate 12/12/24 1112 80     Resp 12/12/24 1112 18     Temp 12/12/24 1112 97.8 F (36.6 C)     Temp Source 12/12/24 1112 Oral     SpO2 12/12/24 1112 95 %     Weight 12/12/24 1109 187 lb (84.8 kg)     Height 12/12/24 1109 5' 11 (1.803 m)     Head Circumference --      Peak Flow --      Pain Score 12/12/24 1109 0     Pain Loc --      Pain Education --      Exclude from Growth Chart --    No data found.  Updated Vital Signs BP 117/70 (BP Location: Left Arm)   Pulse 80   Temp 97.8 F (36.6 C) (Oral)   Resp 18   Ht 5' 11 (1.803 m)   Wt 187 lb (84.8 kg)   SpO2 95%   BMI 26.08 kg/m   Visual Acuity Right Eye Distance:   Left Eye Distance:   Bilateral Distance:    Right Eye Near:   Left Eye Near:    Bilateral Near:     Physical Exam Constitutional:      General: He is not in acute distress. HENT:      Right Ear: Tympanic membrane normal.     Left Ear: Tympanic membrane normal.     Nose: Nose normal.     Mouth/Throat:     Mouth: Mucous membranes are moist.     Pharynx: Oropharynx is clear.  Cardiovascular:     Rate and Rhythm: Normal rate and regular rhythm.     Heart sounds: Normal heart sounds.  Pulmonary:     Effort: Pulmonary effort is normal. No respiratory distress.     Breath sounds: Normal breath sounds.  Neurological:     Mental Status: He is alert.      UC Treatments / Results  Labs (all labs ordered are listed, but only abnormal results are displayed) Labs Reviewed - No data to display  EKG   Radiology No results found.  Procedures Procedures (including critical care time)  Medications Ordered in UC Medications - No data to display  Initial Impression / Assessment and Plan / UC Course  I have reviewed the triage vital signs and the nursing notes.  Pertinent labs & imaging results that were available during my care of the patient were reviewed by me and considered in my medical decision making (see chart for details).    Acute bronchitis.  Afebrile and vital signs are stable.  Lungs are clear at this time and O2 sat is 95% on room air.  Patient declines COVID or flu testing.  Treating today with albuterol  inhaler and prednisone  taper.  Instructed patient to follow-up with his PCP.  ED precautions given.  Education provided on acute bronchitis.  He agrees to plan of care.  Final  Clinical Impressions(s) / UC Diagnoses   Final diagnoses:  Acute bronchitis, unspecified organism     Discharge Instructions      Take the prednisone  and use the albuterol  inhaler as directed.  Follow up with your primary care provider.  Go to the emergency department if you have worsening symptoms.        ED Prescriptions     Medication Sig Dispense Auth. Provider   albuterol  (VENTOLIN  HFA) 108 (90 Base) MCG/ACT inhaler Inhale 1-2 puffs into the lungs every 6 (six)  hours as needed. 18 g Corlis Burnard DEL, NP   predniSONE  (STERAPRED UNI-PAK 21 TAB) 10 MG (21) TBPK tablet Take by mouth daily. As directed 21 tablet Corlis Burnard DEL, NP      PDMP not reviewed this encounter.    [1]  Social History Tobacco Use   Smoking status: Former    Current packs/day: 0.00    Average packs/day: 1 pack/day for 5.0 years (5.0 ttl pk-yrs)    Types: Cigarettes    Start date: 06/20/1966    Quit date: 06/21/1971    Years since quitting: 53.5   Smokeless tobacco: Former    Types: Chew    Quit date: 06/20/1980  Vaping Use   Vaping status: Never Used  Substance Use Topics   Alcohol use: Not Currently    Comment: rare glass of wine, monthly or less   Drug use: No     Corlis Burnard DEL, NP 12/12/24 1214  "

## 2024-12-14 ENCOUNTER — Encounter: Payer: Self-pay | Admitting: Nurse Practitioner

## 2024-12-24 ENCOUNTER — Encounter: Payer: Self-pay | Admitting: Nurse Practitioner

## 2024-12-29 ENCOUNTER — Other Ambulatory Visit: Payer: Self-pay | Admitting: Cardiovascular Disease

## 2025-02-07 ENCOUNTER — Encounter: Admitting: Nurse Practitioner

## 2025-02-08 ENCOUNTER — Ambulatory Visit

## 2025-02-10 ENCOUNTER — Ambulatory Visit: Admitting: Cardiovascular Disease

## 2025-02-15 ENCOUNTER — Encounter: Admitting: Nurse Practitioner

## 2025-02-16 ENCOUNTER — Encounter: Admitting: Nurse Practitioner

## 2025-03-07 ENCOUNTER — Encounter: Admitting: Nurse Practitioner

## 2025-03-17 ENCOUNTER — Inpatient Hospital Stay: Admitting: Oncology

## 2025-03-17 ENCOUNTER — Inpatient Hospital Stay
